# Patient Record
Sex: Male | Born: 1980
Health system: Southern US, Community
[De-identification: ages and names within clinical notes are randomized; demographics above are authoritative.]

## PROBLEM LIST (undated history)

## (undated) DIAGNOSIS — T8149XA Infection following a procedure, other surgical site, initial encounter: Secondary | ICD-10-CM

## (undated) DIAGNOSIS — J189 Pneumonia, unspecified organism: Secondary | ICD-10-CM

## (undated) DIAGNOSIS — L02213 Cutaneous abscess of chest wall: Secondary | ICD-10-CM

## (undated) DIAGNOSIS — Z8739 Personal history of other diseases of the musculoskeletal system and connective tissue: Secondary | ICD-10-CM

## (undated) DIAGNOSIS — K6812 Psoas muscle abscess: Secondary | ICD-10-CM

## (undated) DIAGNOSIS — B9561 Methicillin susceptible Staphylococcus aureus infection as the cause of diseases classified elsewhere: Secondary | ICD-10-CM

## (undated) DIAGNOSIS — A4901 Methicillin susceptible Staphylococcus aureus infection, unspecified site: Secondary | ICD-10-CM

## (undated) DIAGNOSIS — R7881 Bacteremia: Secondary | ICD-10-CM

## (undated) DIAGNOSIS — M009 Pyogenic arthritis, unspecified: Secondary | ICD-10-CM

## (undated) DIAGNOSIS — L0211 Cutaneous abscess of neck: Secondary | ICD-10-CM

## (undated) DIAGNOSIS — F1199 Opioid use, unspecified with unspecified opioid-induced disorder: Secondary | ICD-10-CM

## (undated) DIAGNOSIS — D649 Anemia, unspecified: Secondary | ICD-10-CM

## (undated) HISTORY — DX: Psoas muscle abscess: K68.12

## (undated) HISTORY — DX: Cutaneous abscess of neck: L02.11

## (undated) HISTORY — DX: Cutaneous abscess of chest wall: L02.213

## (undated) HISTORY — DX: Pyogenic arthritis, unspecified: M00.9

## (undated) HISTORY — DX: Opioid use, unspecified with unspecified opioid-induced disorder: F11.99

## (undated) HISTORY — PX: SHOULDER SURGERY: SHX246

## (undated) HISTORY — DX: Bacteremia: R78.81

## (undated) HISTORY — DX: Infection following a procedure, other surgical site, initial encounter: T81.49XA

## (undated) NOTE — *Deleted (*Deleted)
   CC: For ED follow-up, buprenorphine clinic  HPI:  Mr.Clifford Tucker is a 47 y.o. male with history as below presenting for 4 weekly day follow-up. Please refer to problem based charting for further details of assessment and plan of current problem and chronic medical conditions.  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  Recent social stressors include separation from family, legal issues, and new job.  Recently required increased dose of buprenorphine to the stressors.  At last visit, referral from-Suboxone was increased to 8-2 mg twice a day and 4-1 mg at midday.  PDMP reviewed and appropriate*** -Continue buprenorphine 2.5 films per day*** -Tox assure -Follow-up in 4 weeks  GAD As prescribed fluoxetine 20 mg daily since July,***  Preventive health -COVID vaccine: First dose Pfizer received in May -Influenza vaccine  Past Medical History:  Diagnosis Date  . Bacteremia due to methicillin susceptible Staphylococcus aureus (MSSA) 06/06/2017  . Chest wall abscess   . Hx of septic arthritis    Left hip  . 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 Injury of flexor tendon of left hand 05/08/2018  . Left hip postoperative wound infection   . MSSA (methicillin susceptible Staphylococcus aureus) 05/2017  . MSSA bacteremia   . Neck abscess 06/05/2017  . Normocytic anemia   . Opioid use disorder (HCC) 08/08/2017  . Opioid use disorder (HCC)   . Pneumonia   . Septic arthritis of hip (HCC) 06/06/2017  . Septic arthritis of sternoclavicular joint, right (HCC) 06/06/2017   Review of Systems:   ROS ***  Physical Exam: There were no vitals filed for this visit. Constitutional: no acute distress Head: atraumatic ENT: external ears normal Cardiovascular: regular rate and rhythm, normal heart sounds Pulmonary: effort normal, normal breath sounds bilaterally Abdominal: flat, nontender, no rebound tenderness, bowel sounds normal Musculoskeletal: ***  Skin: warm and dry Neurological: alert, no focal deficit Psychiatric: normal mood and affect  Assessment & Plan:   See Encounters Tab for problem based charting.  Patient {GC/GE:[id number]::"discussed with","seen with"} Dr. {NAMES:[id number]::"Butcher","Guilloud","Hoffman","Mullen","Narendra","Raines","Vincent"}

---
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
## 2001-08-18 ENCOUNTER — Emergency Department (HOSPITAL_COMMUNITY): Admission: EM | Admit: 2001-08-18 | Discharge: 2001-08-18 | Payer: Self-pay | Admitting: Emergency Medicine

## 2002-10-30 ENCOUNTER — Emergency Department (HOSPITAL_COMMUNITY): Admission: EM | Admit: 2002-10-30 | Discharge: 2002-10-30 | Payer: Self-pay | Admitting: *Deleted

## 2005-06-19 ENCOUNTER — Emergency Department (HOSPITAL_COMMUNITY): Admission: EM | Admit: 2005-06-19 | Discharge: 2005-06-19 | Payer: Self-pay | Admitting: *Deleted

## 2005-06-21 ENCOUNTER — Ambulatory Visit: Payer: Self-pay | Admitting: Internal Medicine

## 2005-06-21 ENCOUNTER — Inpatient Hospital Stay (HOSPITAL_COMMUNITY): Admission: EM | Admit: 2005-06-21 | Discharge: 2005-06-23 | Payer: Self-pay | Admitting: Emergency Medicine

## 2007-02-22 ENCOUNTER — Emergency Department (HOSPITAL_COMMUNITY): Admission: EM | Admit: 2007-02-22 | Discharge: 2007-02-22 | Payer: Self-pay | Admitting: Emergency Medicine

## 2007-02-24 ENCOUNTER — Emergency Department (HOSPITAL_COMMUNITY): Admission: EM | Admit: 2007-02-24 | Discharge: 2007-02-24 | Payer: Self-pay | Admitting: Emergency Medicine

## 2007-03-13 ENCOUNTER — Emergency Department (HOSPITAL_COMMUNITY): Admission: EM | Admit: 2007-03-13 | Discharge: 2007-03-13 | Payer: Self-pay | Admitting: Emergency Medicine

## 2009-10-12 ENCOUNTER — Emergency Department (HOSPITAL_COMMUNITY): Admission: EM | Admit: 2009-10-12 | Discharge: 2009-10-12 | Payer: Self-pay | Admitting: Emergency Medicine

## 2009-11-03 ENCOUNTER — Emergency Department (HOSPITAL_COMMUNITY): Admission: EM | Admit: 2009-11-03 | Discharge: 2009-11-03 | Payer: Self-pay | Admitting: Emergency Medicine

## 2010-09-13 ENCOUNTER — Emergency Department (HOSPITAL_COMMUNITY)
Admission: EM | Admit: 2010-09-13 | Discharge: 2010-09-13 | Disposition: A | Discharge status: Home / Self Care | Payer: Self-pay | Attending: Emergency Medicine | Admitting: Emergency Medicine

## 2010-09-13 DIAGNOSIS — K047 Periapical abscess without sinus: Secondary | ICD-10-CM | POA: Insufficient documentation

## 2010-09-13 DIAGNOSIS — K089 Disorder of teeth and supporting structures, unspecified: Secondary | ICD-10-CM | POA: Insufficient documentation

## 2010-10-08 NOTE — Discharge Summary (Signed)
Clifford Tucker, Clifford Tucker           ACCOUNT NO.:  [id number]   MEDICAL RECORD NO.:  [id number]          PATIENT TYPE:  INP   LOCATION:  4709                         FACILITY:  [facility]   PHYSICIAN:  Duncan Dull, M.D.     DATE OF BIRTH:  May 03, 1981   DATE OF ADMISSION:  06/21/2005  DATE OF DISCHARGE:  06/23/2005                                 DISCHARGE SUMMARY   DISCHARGE DIAGNOSES:  1.  Community-acquired pneumonia.  2.  Dehydration.   MEDICATIONS AT DISCHARGE:  1.  Avelox 400 mg p.o. daily times twelve more days.  2.  Robitussin DM over-the-counter for cough and congestion.   CONDITION ON DISCHARGE:  Improved symptoms.   The patient will follow up with Dr. Phillips Odor on February 8th at 2:30 p.m. at  [facility]. He is given samples of Avelox to complete a 14-  day course of antibiotics.   PROCEDURE:  1.  Chest x-ray on June 21, 2005, revealed right hilar mass in the      partial superior segment of the right lower lobe, consider possibility      of lymphoma with secondary resultant collapse. Possible left lung base      nodular airspace. Further vascular and chest CT is recommended.  2.  Chest CT on June 21, 2005, revealed infiltrate with dense      consolidation in the superior segment of right lower lobe. Infiltrate in      the region of the anterior basilar segment left lower lobe. No definite      hilar mass is seen. Recommend follow up until complete clearing of these      densities.   BRIEF HISTORY AND PHYSICAL:  Clifford Tucker is a 30 year old previously healthy  white male with a five-day history of cough, fever, and chills; nausea,  vomiting, and diarrhea began about two days prior to admission. He was seen  at the emergency department  at that time and suspected to have  gastroenteritis and was sent home one day prior to admission. He developed  right pleuritic chest pain with blood tinged sputum. He denies any sick  contacts. He denies nightsweats.  Denies weight loss.   PAST MEDICAL HISTORY:  None.   MEDICATIONS:  None.   PHYSICAL EXAMINATION:  On admission temperature was 100.6, blood pressure  122/78, pulse 106, respiratory rate 29, oxygen saturation 94% on room air.  In general, he was in no acute distress, alert and oriented. ENT revealed  dry mucous membranes. Oropharynx was clear. Pupils equal, round, and  reactive to light. Neck was supple. Cardiovascular was tachycardic, but  regular. No murmurs. Lung exam notable for decreased breath sounds right  lower lobe and right mid lung fields. Expiratory rhonchi in the left long.  No egophony. Abdomen was soft,  nontender, nondistended. Positive bowel  sounds. Extremities revealed no edema. Neurologic exam was nonfocal.   LABORATORY DATA:  White count 21.8, hemoglobin 13.2, platelet count 189,000,  ANC 20.9. Sodium 129, potassium 3.1, chloride 94, bicarbonate 25, BUN 6,  creatinine 0.9, and glucose of 107.   HOSPITAL COURSE BY PROBLEM:  Problem #1:  Community acquired pneumonia with  right lobe consolidation associated with blood-tinged sputum, pleuritic  chest pain, and rigors. Patient was admitted and treated empirically  treatemend with cephalosprin and macrolide. By the second day of admission  the patient felt much better. He has taken the pneumococcal vaccine and  white count increased from 12.8 to 13.3 and then down to 8.1. On the day of  discharge he was changed to oral antibiotics with Avelox and discharged home  with a 12-day course of Avelox to complete a 14-day course of antibiotics.  He will follow up in the clinic with Dr. Phillips Odor on June 30, 2005,  to  ensure resolution of symptoms. Will need a repeat chest x-ray or CT in four  to six weeks for follow up resolution of the densities seen on these imaging  studies. An HIV viral load was also obtained and these results are pending  at the time of discharge.   DISCHARGE LABORATORIES AND VITALS:  Temperature 98.2,  blood pressure 120/73,  pulse 69, respiratory rate 16, oxygen saturation 98% on room air. White  count 8.1, hemoglobin 13.1, platelet count 274,000. Sodium 127, potassium  3.6, chloride 105, bicarbonate 24, BUN 3, creatinine 0.7, glucose 87.      Clent Demark, M.D.      Duncan Dull, M.D.  Electronically Signed    LG/MEDQ  D:  06/28/2005  T:  06/28/2005  Job:  [id number]

## 2011-03-02 LAB — I-STAT 8, (EC8 V) (CONVERTED LAB)
BUN: 7
Bicarbonate: 20.2
Glucose, Bld: 93
HCT: 51
pCO2, Ven: 35.3 — ABNORMAL LOW

## 2011-03-02 LAB — POCT CARDIAC MARKERS
CKMB, poc: <1 — ABNORMAL LOW
Myoglobin, poc: 17.3
Troponin i, poc: <0.05

## 2011-03-02 LAB — ETHANOL: Alcohol, Ethyl (B): 125 — ABNORMAL HIGH

## 2011-03-02 LAB — POCT I-STAT CREATININE: Operator id: 272551

## 2011-12-01 ENCOUNTER — Encounter (HOSPITAL_COMMUNITY): Payer: Self-pay | Admitting: Emergency Medicine

## 2011-12-01 ENCOUNTER — Emergency Department (HOSPITAL_COMMUNITY)
Admission: EM | Admit: 2011-12-01 | Discharge: 2011-12-01 | Disposition: A | Discharge status: Home / Self Care | Payer: Self-pay | Attending: Emergency Medicine | Admitting: Emergency Medicine

## 2011-12-01 ENCOUNTER — Emergency Department (HOSPITAL_COMMUNITY): Payer: Self-pay

## 2011-12-01 DIAGNOSIS — J189 Pneumonia, unspecified organism: Secondary | ICD-10-CM | POA: Insufficient documentation

## 2011-12-01 DIAGNOSIS — J939 Pneumothorax, unspecified: Secondary | ICD-10-CM | POA: Insufficient documentation

## 2011-12-01 DIAGNOSIS — R079 Chest pain, unspecified: Secondary | ICD-10-CM | POA: Insufficient documentation

## 2011-12-01 DIAGNOSIS — F17200 Nicotine dependence, unspecified, uncomplicated: Secondary | ICD-10-CM | POA: Insufficient documentation

## 2011-12-01 HISTORY — DX: Pneumonia, unspecified organism: J18.9

## 2011-12-01 LAB — POCT I-STAT TROPONIN I: Troponin i, poc: 0 ng/mL (ref 0.00–0.08)

## 2011-12-01 MED ORDER — NAPROXEN 500 MG PO TABS: 500.0000 mg | ORAL_TABLET | Freq: Two times a day (BID) | ORAL | Status: DC

## 2011-12-01 MED ORDER — KETOROLAC TROMETHAMINE 60 MG/2ML IM SOLN
60.0000 mg | Freq: Once | INTRAMUSCULAR | Status: AC
  Administered 2011-12-01: 60 mg via INTRAMUSCULAR
  Filled 2011-12-01: qty 2

## 2011-12-01 MED ORDER — LORAZEPAM 1 MG PO TABS
1.0000 mg | ORAL_TABLET | Freq: Once | ORAL | Status: AC
  Administered 2011-12-01: 1 mg via ORAL
  Filled 2011-12-01: qty 1

## 2011-12-01 NOTE — ED Notes (Signed)
Patient pacing the room complaining of severe chest discomfort. Patient has removed ECG leads and BP cuff. RN attempted to explain to patient rationale of ECG/BP monitoring. Patient being belligerent and difficult with staff. RN administered pain medication as ordered. Spoke with MD who will see the patient. Monitoring resumed. VSS. NAD. AAOx4. Will continue to monitor

## 2011-12-01 NOTE — ED Notes (Signed)
C/o left side CP worse with mvmt & inspiration since yesterday. States having to breathe shallow due to pain. Denies n/v, diaphoresis, cold, cough, fever, chills. Reports feels the same way when Dx with PNA & "a partially collapsed lung" in past but pt did not ever have a chest tube. POX> 99% RA. Lungs clear BBS.

## 2011-12-01 NOTE — ED Provider Notes (Signed)
History     CSN: [id number]  Arrival date & time 12/01/11  1807   First MD Initiated Contact with Patient 12/01/11 1841      Chief Complaint  Patient presents with  . Chest Pain    (Consider location/radiation/quality/duration/timing/severity/associated sxs/prior treatment) Patient is a 31 y.o. male presenting with chest pain. The history is provided by the patient.  Chest Pain The chest pain began yesterday. Duration of episode(s) is 24 hours. Chest pain occurs constantly. The chest pain is worsening. The pain is associated with breathing and coughing. The severity of the pain is moderate. The quality of the pain is described as sharp. The pain does not radiate. Chest pain is worsened by certain positions. Pertinent negatives for primary symptoms include no fever, no fatigue, no shortness of breath, no cough, no wheezing, no palpitations, no abdominal pain, no nausea and no vomiting.  Pertinent negatives for associated symptoms include no numbness and no weakness. He tried nothing for the symptoms. Risk factors include male gender.  Pertinent negatives for family medical history include: no CAD in family.  Procedure history is negative for cardiac catheterization.     Past Medical History  Diagnosis Date  . Pneumonia     Past Surgical History  Procedure Date  . Shoulder surgery     History reviewed. No pertinent family history.  History  Substance Use Topics  . Smoking status: Current Everyday Smoker  . Smokeless tobacco: Not on file  . Alcohol Use: No      Review of Systems  Constitutional: Negative for fever, activity change, appetite change and fatigue.  HENT: Negative for congestion, sore throat, facial swelling, rhinorrhea, trouble swallowing, neck pain, neck stiffness, voice change and sinus pressure.   Eyes: Negative.   Respiratory: Negative for cough, choking, chest tightness, shortness of breath and wheezing.   Cardiovascular: Positive for chest pain.  Negative for palpitations and leg swelling.  Gastrointestinal: Negative for nausea, vomiting and abdominal pain.  Genitourinary: Negative for dysuria, urgency, frequency, hematuria, flank pain and difficulty urinating.  Musculoskeletal: Negative for back pain and gait problem.  Skin: Negative for rash and wound.  Neurological: Negative for facial asymmetry, weakness, numbness and headaches.  Psychiatric/Behavioral: Negative for behavioral problems, confusion and agitation. The patient is not nervous/anxious and is not hyperactive.   All other systems reviewed and are negative.    Allergies  Review of patient's allergies indicates no known allergies.  Home Medications   Current Outpatient Rx  Name Route Sig Dispense Refill  . BC HEADACHE POWDER PO Oral Take 1 packet by mouth 2 (two) times daily as needed. For chest pain      BP 159/80  Pulse 66  Temp 99 F (37.2 C) (Oral)  Resp 18  SpO2 100%  Physical Exam  Nursing note and vitals reviewed. Constitutional: He is oriented to person, place, and time. He appears well-developed and well-nourished. No distress.  HENT:  Head: Normocephalic and atraumatic.  Right Ear: External ear normal.  Left Ear: External ear normal.  Mouth/Throat: No oropharyngeal exudate.  Eyes: Conjunctivae and EOM are normal. Pupils are equal, round, and reactive to light. Right eye exhibits no discharge. Left eye exhibits no discharge.  Neck: Normal range of motion. Neck supple. No JVD present. No tracheal deviation present. No thyromegaly present.  Cardiovascular: Normal rate, regular rhythm, normal heart sounds and intact distal pulses.  Exam reveals no gallop and no friction rub.   No murmur heard. Pulmonary/Chest: Effort normal and breath sounds normal.  No respiratory distress. He has no wheezes. He exhibits no tenderness.  Abdominal: Soft. Bowel sounds are normal. He exhibits no distension. There is no tenderness. There is no rebound and no guarding.    Musculoskeletal: Normal range of motion. He exhibits no edema and no tenderness.  Lymphadenopathy:    He has no cervical adenopathy.  Neurological: He is alert and oriented to person, place, and time. No cranial nerve deficit.  Skin: Skin is warm and dry. No rash noted. He is not diaphoretic. No pallor.  Psychiatric: He has a normal mood and affect. His behavior is normal.    ED Course  Procedures (including critical care time)   Labs Reviewed  D-DIMER, QUANTITATIVE   Dg Chest 2 View  12/01/2011  *RADIOLOGY REPORT*  Clinical Data: 31 year old male with left side chest pain.  CHEST - 2 VIEW  Comparison: 03/13/2007.  Findings: Slightly lower lung volumes.  Cardiac size and mediastinal contours are within normal limits.  Visualized tracheal air column is within normal limits.  No pneumothorax or effusion. No pulmonary edema.  No acute pulmonary opacity.  Incidental EKG button artifact projecting over both upper lobes and the left lower lobe. No acute osseous abnormality identified.  IMPRESSION: No acute cardiopulmonary abnormality.  Original Report Authenticated By: Harley Hallmark, M.D.     No diagnosis found.    MDM  31 year old male patient with past medical history of pneumonia and reported history of a pneumothorax on the left that did not require chest tube about 10 years ago. Patient says he's been having sharp left-sided chest pain at rest that started spontaneously not worse with exertion but is worse with certain positions deep breathing and seems very pleuritic in nature the last 24 hours and is increasing in intensity. Patient with Wells score of 0 point but not Perc negatives given the pleuritic nature the pain. Will get d-dimer to assess possibility of pulmonary embolism. doubt ACS given TIMI score of 0.  Results for orders placed during the hospital encounter of 12/01/11  D-DIMER, QUANTITATIVE      Component Value Range   D-Dimer, Quant 0.37  0.00 - 0.48 ug/mL-FEU  POCT  I-STAT TROPONIN I      Component Value Range   Troponin i, poc 0.00  0.00 - 0.08 ng/mL   Comment 3             DG Chest 2 View (Final result)   Result time:12/01/11 1849    Final result by Rad Results In Interface (12/01/11 18:49:31)    Narrative:   *RADIOLOGY REPORT*  Clinical Data: 31 year old male with left side chest pain.  CHEST - 2 VIEW  Comparison: 03/13/2007.  Findings: Slightly lower lung volumes. Cardiac size and mediastinal contours are within normal limits. Visualized tracheal air column is within normal limits. No pneumothorax or effusion. No pulmonary edema. No acute pulmonary opacity. Incidental EKG button artifact projecting over both upper lobes and the left lower lobe. No acute osseous abnormality identified.  IMPRESSION: No acute cardiopulmonary abnormality.  Original Report Authenticated By: Harley Hallmark, M.D.     Chest x-ray shows no pneumonia or pneumothorax my exam is normal. Clear lung sounds throughout. Give patient Toradol. Pain resolved with Toradol. Patient labs reassuring and normal. Could be musculoskeletal or pleurisy pain. Patient discharged to followup with PCP.  Case discussed with Dr.Rancour  Sherryl Manges, MD 12/02/11 [phone]

## 2011-12-01 NOTE — ED Notes (Signed)
Pt c/o midsternal CP starting last night that was worse today when taking a deep breath; pt sts feels similar to when have pneumothorax in past; lung sounds diminished more on right

## 2011-12-02 NOTE — ED Provider Notes (Signed)
I saw and evaluated the patient, reviewed the resident's note and I agree with the findings and plan.  Diffuse chest pain that radiates across chest, worse with deep breathing.  Hx PTX. Lungs clear, CXR neg. D-dimer neg.    Glynn Octave, MD 12/02/11 [phone]

## 2012-10-03 ENCOUNTER — Emergency Department (HOSPITAL_COMMUNITY): Payer: Self-pay

## 2012-10-03 ENCOUNTER — Encounter (HOSPITAL_COMMUNITY): Payer: Self-pay | Admitting: Emergency Medicine

## 2012-10-03 ENCOUNTER — Emergency Department (HOSPITAL_COMMUNITY)
Admission: EM | Admit: 2012-10-03 | Discharge: 2012-10-03 | Disposition: A | Discharge status: Home / Self Care | Payer: Self-pay | Attending: Emergency Medicine | Admitting: Emergency Medicine

## 2012-10-03 DIAGNOSIS — F17200 Nicotine dependence, unspecified, uncomplicated: Secondary | ICD-10-CM | POA: Insufficient documentation

## 2012-10-03 DIAGNOSIS — Z8701 Personal history of pneumonia (recurrent): Secondary | ICD-10-CM | POA: Insufficient documentation

## 2012-10-03 DIAGNOSIS — N509 Disorder of male genital organs, unspecified: Secondary | ICD-10-CM | POA: Insufficient documentation

## 2012-10-03 LAB — URINALYSIS, ROUTINE W REFLEX MICROSCOPIC
Ketones, ur: NEGATIVE mg/dL
Leukocytes, UA: NEGATIVE
Nitrite: NEGATIVE
Protein, ur: NEGATIVE mg/dL
pH: 6 (ref 5.0–8.0)

## 2012-10-03 MED ORDER — CEFTRIAXONE SODIUM 250 MG IJ SOLR
250.0000 mg | Freq: Once | INTRAMUSCULAR | Status: AC
  Administered 2012-10-03: 250 mg via INTRAMUSCULAR
  Filled 2012-10-03: qty 250

## 2012-10-03 MED ORDER — DOXYCYCLINE HYCLATE 100 MG PO TABS
100.0000 mg | ORAL_TABLET | Freq: Once | ORAL | Status: AC
  Administered 2012-10-03: 100 mg via ORAL
  Filled 2012-10-03: qty 1

## 2012-10-03 MED ORDER — DOXYCYCLINE HYCLATE 100 MG PO TABS: 100.0000 mg | ORAL_TABLET | Freq: Two times a day (BID) | ORAL | Status: DC

## 2012-10-03 MED ORDER — OXYCODONE-ACETAMINOPHEN 5-325 MG PO TABS
2.0000 | ORAL_TABLET | Freq: Once | ORAL | Status: AC
  Administered 2012-10-03: 2 via ORAL
  Filled 2012-10-03: qty 2

## 2012-10-03 MED ORDER — HYDROCODONE-ACETAMINOPHEN 5-325 MG PO TABS: ORAL_TABLET | ORAL | Status: DC

## 2012-10-03 MED ORDER — NAPROXEN 250 MG PO TABS: 250.0000 mg | ORAL_TABLET | Freq: Two times a day (BID) | ORAL | Status: DC

## 2012-10-03 NOTE — ED Provider Notes (Signed)
History     CSN: [id number]  Arrival date & time 10/03/12  0729   First MD Initiated Contact with Patient 10/03/12 0750      Chief Complaint  Patient presents with  . Testicle Pain     HPI Pt was seen at 0805.   Per pt, c/o gradual onset and persistence of constant bilat testicular "pain" for the past 1 month.  Describes the pain as "throbbing," located in his bilat testicles.  Has been associated with testicular "swelling."  Denies back/flank pain, no penile drainage, no dysuria/hematuria, no N/V/D, no abd pain, no rash, no fevers.     Past Medical History  Diagnosis Date  . Pneumonia     Past Surgical History  Procedure Laterality Date  . Shoulder surgery        History  Substance Use Topics  . Smoking status: Current Every Day Smoker -- 1.00 packs/day  . Smokeless tobacco: Not on file  . Alcohol Use: No      Review of Systems ROS: Statement: All systems negative except as marked or noted in the HPI; Constitutional: Negative for fever and chills. ; ; Eyes: Negative for eye pain, redness and discharge. ; ; ENMT: Negative for ear pain, hoarseness, nasal congestion, sinus pressure and sore throat. ; ; Cardiovascular: Negative for chest pain, palpitations, diaphoresis, dyspnea and peripheral edema. ; ; Respiratory: Negative for cough, wheezing and stridor. ; ; Gastrointestinal: Negative for nausea, vomiting, diarrhea, abdominal pain, blood in stool, hematemesis, jaundice and rectal bleeding. ; ; Genitourinary: Negative for dysuria, flank pain and hematuria. ; ; Genital:  No penile drainage or rash, +bilat testicular pain and swelling, no scrotal rash or swelling.;; Musculoskeletal: Negative for back pain and neck pain. Negative for swelling and trauma.; ; Skin: Negative for pruritus, rash, abrasions, blisters, bruising and skin lesion.; ; Neuro: Negative for headache, lightheadedness and neck stiffness. Negative for weakness, altered level of consciousness , altered mental  status, extremity weakness, paresthesias, involuntary movement, seizure and syncope.       Allergies  Review of patient's allergies indicates no known allergies.  Home Medications   Current Outpatient Rx  Name  Route  Sig  Dispense  Refill  . Aspirin-Salicylamide-Caffeine (BC HEADACHE POWDER PO)   Oral   Take 1 packet by mouth 2 (two) times daily as needed. For chest pain         . naproxen (NAPROSYN) 500 MG tablet   Oral   Take 1 tablet (500 mg total) by mouth 2 (two) times daily.   30 tablet   0     BP 136/72  Pulse 87  Temp(Src) 98.8 F (37.1 C) (Oral)  Resp 22  Ht 5\' 10"  (1.778 m)  Wt 160 lb (72.576 kg)  BMI 22.96 kg/m2  SpO2 99%  Physical Exam 0810: Physical examination:  Nursing notes reviewed; Vital signs and O2 SAT reviewed;  Constitutional: Well developed, Well nourished, Well hydrated, In no acute distress; Head:  Normocephalic, atraumatic; Eyes: EOMI, PERRL, No scleral icterus; ENMT: Mouth and pharynx normal, Mucous membranes moist; Neck: Supple, Full range of motion, No lymphadenopathy; Cardiovascular: Regular rate and rhythm, No murmur, rub, or gallop; Respiratory: Breath sounds clear & equal bilaterally, No rales, rhonchi, wheezes.  Speaking full sentences with ease, Normal respiratory effort/excursion; Chest: Nontender, Movement normal; Abdomen: Soft, Nontender, Nondistended, Normal bowel sounds; Genitourinary: No CVA tenderness. Genital exam performed with pt permission and male ED staff chaperone present during exam. No perineal erythema.  No penile blisters, no  drainage.  No scrotal erythema or edema to palp.  Normal testicular lie. +bilat generalized testicular tenderness to palp.  +cremasteric reflexes bilat.  No inguinal LAN or palpable masses.;; Extremities: Pulses normal, No tenderness, No edema, No calf edema or asymmetry.; Neuro: AA&Ox3, Major CN grossly intact.  Speech clear. No gross focal motor or sensory deficits in extremities.; Skin: Color normal,  Warm, Dry, +several scattered small excoriated maculopapular lesions on lower abd wall, upper genital area, 2 on upper penile shaft. No drainage, no blisters.   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  States he "got these in the past several days to week" vs his testicular pain that has been constant for the past 1 month.  Continues to deny penile drainage, dysuria or hematuria.  Will check GC/chlam, and testicular US. Pt agreeable with plan.    MDM  MDM Reviewed: nursing note, vitals and previous chart Interpretation: ultrasound and labs   Results for orders placed during the hospital encounter of 10/03/12  URINALYSIS, ROUTINE W REFLEX MICROSCOPIC      Result Value Range   Color, Urine YELLOW  YELLOW   APPearance CLEAR  CLEAR   Specific Gravity, Urine 1.015  1.005 - 1.030   pH 6.0  5.0 - 8.0   Glucose, UA NEGATIVE  NEGATIVE mg/dL   Hgb urine dipstick NEGATIVE  NEGATIVE   Bilirubin Urine NEGATIVE  NEGATIVE   Ketones, ur NEGATIVE  NEGATIVE mg/dL   Protein, ur NEGATIVE  NEGATIVE mg/dL   Urobilinogen, UA 0.2  0.0 - 1.0 mg/dL   Nitrite NEGATIVE  NEGATIVE   Leukocytes, UA NEGATIVE  NEGATIVE   [place] Art/ven Flow Abd Pelv Doppler 10/03/2012   *RADIOLOGY REPORT*  Clinical Data:  Bilateral testicular pain for 1 month.  Rule out torsion.  SCROTAL ULTRASOUND DOPPLER ULTRASOUND OF THE TESTICLES  Technique: Complete ultrasound examination of the testicles, epididymis, and other scrotal structures was performed.  Color and spectral Doppler ultrasound were also utilized to evaluate blood flow to the testicles.  Comparison:  None  Findings:  Right testis:  5.2 x 2.8 x 3.1 cm.  Normal in gray scale and color Doppler appearance.  Left testis:  4.9 x 2.4 x 2.7 cm.  Normal in gray scale and color Doppler appearance.  Right epididymis:  Normal in size and  appearance.  Left epididymis:  Normal in size and appearance.  Hydrocele:  Bilateral.  Small to moderate on the left and small on the right.  Varicocele:  Absent  Pulsed Doppler interrogation of both testes demonstrates low resistance flow bilaterally. Incidental note is made of an echogenic focus within the left hemi scrotum measuring 6 mm.  Likely a "scrotal pearl."  Image 70.  IMPRESSION:  1.  No evidence of testicular torsion or other explanation for scrotal pain. 2.  Bilateral left greater than right hydroceles. 3.  Probable left-sided "scrotal pearl."  Typically these are secondary to  remote torsion of a testicular appendage or remote trauma. Likely of no clinical significance.   Original Report Authenticated By: Jeronimo Greaves, M.D.    1025:  US testicles without acute findings.  GC/chlam pending; will tx empirically for both at this time and refer to Uro MD.  Dx and testing d/w pt. Questions answered.  Verb understanding, agreeable to d/c home with outpt f/u.  Laray Anger, DO 10/06/12 1047

## 2012-10-03 NOTE — ED Notes (Signed)
Pt waited 15 minute shot time before being d/c. No s/s of allergic reaction noted. nad noted at d/c.

## 2012-10-03 NOTE — ED Notes (Addendum)
Pt reports testicle pain and swelling x1 month. Pt reports pain and swelling is getting worse. Pt denies any urinary or GI symptoms.nad noted. Pt denies injury.

## 2012-10-04 LAB — URINE CULTURE: Culture: NO GROWTH

## 2017-05-23 DIAGNOSIS — A4901 Methicillin susceptible Staphylococcus aureus infection, unspecified site: Secondary | ICD-10-CM

## 2017-05-23 HISTORY — DX: Methicillin susceptible Staphylococcus aureus infection, unspecified site: A49.01

## 2017-06-05 ENCOUNTER — Inpatient Hospital Stay (HOSPITAL_COMMUNITY)
Admission: EM | Admit: 2017-06-05 | Discharge: 2017-08-02 | DRG: 853 | Disposition: A | Discharge status: Home / Self Care | Payer: Self-pay | Attending: Oncology | Admitting: Oncology

## 2017-06-05 ENCOUNTER — Emergency Department (HOSPITAL_COMMUNITY): Payer: Self-pay

## 2017-06-05 ENCOUNTER — Encounter (HOSPITAL_COMMUNITY): Payer: Self-pay | Admitting: Emergency Medicine

## 2017-06-05 ENCOUNTER — Other Ambulatory Visit: Payer: Self-pay

## 2017-06-05 DIAGNOSIS — E876 Hypokalemia: Secondary | ICD-10-CM | POA: Diagnosis not present

## 2017-06-05 DIAGNOSIS — F17200 Nicotine dependence, unspecified, uncomplicated: Secondary | ICD-10-CM | POA: Diagnosis present

## 2017-06-05 DIAGNOSIS — M25552 Pain in left hip: Secondary | ICD-10-CM | POA: Diagnosis present

## 2017-06-05 DIAGNOSIS — J188 Other pneumonia, unspecified organism: Secondary | ICD-10-CM | POA: Diagnosis present

## 2017-06-05 DIAGNOSIS — D72819 Decreased white blood cell count, unspecified: Secondary | ICD-10-CM | POA: Diagnosis not present

## 2017-06-05 DIAGNOSIS — F419 Anxiety disorder, unspecified: Secondary | ICD-10-CM | POA: Diagnosis not present

## 2017-06-05 DIAGNOSIS — M60004 Infective myositis, unspecified left leg: Secondary | ICD-10-CM | POA: Diagnosis present

## 2017-06-05 DIAGNOSIS — L0211 Cutaneous abscess of neck: Secondary | ICD-10-CM | POA: Diagnosis present

## 2017-06-05 DIAGNOSIS — R7881 Bacteremia: Secondary | ICD-10-CM | POA: Diagnosis present

## 2017-06-05 DIAGNOSIS — I76 Septic arterial embolism: Secondary | ICD-10-CM | POA: Diagnosis present

## 2017-06-05 DIAGNOSIS — F411 Generalized anxiety disorder: Secondary | ICD-10-CM

## 2017-06-05 DIAGNOSIS — F1123 Opioid dependence with withdrawal: Secondary | ICD-10-CM | POA: Diagnosis not present

## 2017-06-05 DIAGNOSIS — R509 Fever, unspecified: Secondary | ICD-10-CM

## 2017-06-05 DIAGNOSIS — M009 Pyogenic arthritis, unspecified: Secondary | ICD-10-CM | POA: Diagnosis present

## 2017-06-05 DIAGNOSIS — R0602 Shortness of breath: Secondary | ICD-10-CM

## 2017-06-05 DIAGNOSIS — L02213 Cutaneous abscess of chest wall: Secondary | ICD-10-CM

## 2017-06-05 DIAGNOSIS — K6812 Psoas muscle abscess: Secondary | ICD-10-CM | POA: Diagnosis present

## 2017-06-05 DIAGNOSIS — B999 Unspecified infectious disease: Secondary | ICD-10-CM

## 2017-06-05 DIAGNOSIS — D649 Anemia, unspecified: Secondary | ICD-10-CM | POA: Diagnosis present

## 2017-06-05 DIAGNOSIS — I2690 Septic pulmonary embolism without acute cor pulmonale: Secondary | ICD-10-CM | POA: Diagnosis present

## 2017-06-05 DIAGNOSIS — B9561 Methicillin susceptible Staphylococcus aureus infection as the cause of diseases classified elsewhere: Secondary | ICD-10-CM | POA: Diagnosis present

## 2017-06-05 DIAGNOSIS — J189 Pneumonia, unspecified organism: Secondary | ICD-10-CM | POA: Diagnosis present

## 2017-06-05 DIAGNOSIS — M00011 Staphylococcal arthritis, right shoulder: Secondary | ICD-10-CM | POA: Diagnosis present

## 2017-06-05 DIAGNOSIS — A4101 Sepsis due to Methicillin susceptible Staphylococcus aureus: Principal | ICD-10-CM | POA: Diagnosis present

## 2017-06-05 DIAGNOSIS — M71152 Other infective bursitis, left hip: Secondary | ICD-10-CM | POA: Diagnosis present

## 2017-06-05 DIAGNOSIS — Z23 Encounter for immunization: Secondary | ICD-10-CM

## 2017-06-05 DIAGNOSIS — T8149XA Infection following a procedure, other surgical site, initial encounter: Secondary | ICD-10-CM

## 2017-06-05 DIAGNOSIS — T361X5A Adverse effect of cephalosporins and other beta-lactam antibiotics, initial encounter: Secondary | ICD-10-CM | POA: Diagnosis not present

## 2017-06-05 DIAGNOSIS — J181 Lobar pneumonia, unspecified organism: Secondary | ICD-10-CM | POA: Diagnosis present

## 2017-06-05 DIAGNOSIS — M00052 Staphylococcal arthritis, left hip: Secondary | ICD-10-CM | POA: Diagnosis present

## 2017-06-05 DIAGNOSIS — M869 Osteomyelitis, unspecified: Secondary | ICD-10-CM | POA: Diagnosis present

## 2017-06-05 DIAGNOSIS — R21 Rash and other nonspecific skin eruption: Secondary | ICD-10-CM | POA: Diagnosis not present

## 2017-06-05 HISTORY — DX: Methicillin susceptible Staphylococcus aureus infection, unspecified site: A49.01

## 2017-06-05 HISTORY — DX: Personal history of other diseases of the musculoskeletal system and connective tissue: Z87.39

## 2017-06-05 HISTORY — DX: Anemia, unspecified: D64.9

## 2017-06-05 HISTORY — DX: Cutaneous abscess of neck: L02.11

## 2017-06-05 HISTORY — DX: Methicillin susceptible Staphylococcus aureus infection as the cause of diseases classified elsewhere: B95.61

## 2017-06-05 HISTORY — DX: Bacteremia: R78.81

## 2017-06-05 LAB — COMPREHENSIVE METABOLIC PANEL
ALBUMIN: 2.9 g/dL — AB (ref 3.5–5.0)
ALK PHOS: 201 U/L — AB (ref 38–126)
ALT: 63 U/L (ref 17–63)
ANION GAP: 14 (ref 5–15)
AST: 67 U/L — ABNORMAL HIGH (ref 15–41)
BUN: 9 mg/dL (ref 6–20)
CALCIUM: 9 mg/dL (ref 8.9–10.3)
CO2: 27 mmol/L (ref 22–32)
Chloride: 96 mmol/L — ABNORMAL LOW (ref 101–111)
Creatinine, Ser: 0.66 mg/dL (ref 0.61–1.24)
GFR calc Af Amer: >60 mL/min (ref >60)
GFR calc non Af Amer: >60 mL/min (ref >60)
GLUCOSE: 109 mg/dL — AB (ref 65–99)
Potassium: 3.7 mmol/L (ref 3.5–5.1)
SODIUM: 137 mmol/L (ref 135–145)
Total Bilirubin: 0.5 mg/dL (ref 0.3–1.2)
Total Protein: 7.5 g/dL (ref 6.5–8.1)

## 2017-06-05 LAB — I-STAT CG4 LACTIC ACID, ED: Lactic Acid, Venous: 0.82 mmol/L (ref 0.5–1.9)

## 2017-06-05 LAB — CBC WITH DIFFERENTIAL/PLATELET
BASOS ABS: 0 10*3/uL (ref 0.0–0.1)
BASOS PCT: 0 %
Eosinophils Absolute: 0.5 10*3/uL (ref 0.0–0.7)
Eosinophils Relative: 2 %
HEMATOCRIT: 39.4 % (ref 39.0–52.0)
HEMOGLOBIN: 13.3 g/dL (ref 13.0–17.0)
LYMPHS PCT: 6 %
Lymphs Abs: 1.4 10*3/uL (ref 0.7–4.0)
MCH: 30.6 pg (ref 26.0–34.0)
MCHC: 33.8 g/dL (ref 30.0–36.0)
MCV: 90.6 fL (ref 78.0–100.0)
MONOS PCT: 10 %
Monocytes Absolute: 2.3 10*3/uL — ABNORMAL HIGH (ref 0.1–1.0)
NEUTROS ABS: 18.8 10*3/uL — AB (ref 1.7–7.7)
NEUTROS PCT: 82 %
Platelets: 318 10*3/uL (ref 150–400)
RBC: 4.35 MIL/uL (ref 4.22–5.81)
RDW: 14.3 % (ref 11.5–15.5)
WBC: 23 10*3/uL — ABNORMAL HIGH (ref 4.0–10.5)

## 2017-06-05 LAB — I-STAT CHEM 8, ED
BUN: 6 mg/dL (ref 6–20)
CALCIUM ION: 1.05 mmol/L — AB (ref 1.15–1.40)
CHLORIDE: 95 mmol/L — AB (ref 101–111)
Creatinine, Ser: 0.7 mg/dL (ref 0.61–1.24)
Glucose, Bld: 106 mg/dL — ABNORMAL HIGH (ref 65–99)
HCT: 43 % (ref 39.0–52.0)
Hemoglobin: 14.6 g/dL (ref 13.0–17.0)
Potassium: 3.6 mmol/L (ref 3.5–5.1)
SODIUM: 135 mmol/L (ref 135–145)
TCO2: 29 mmol/L (ref 22–32)

## 2017-06-05 MED ORDER — VANCOMYCIN HCL 10 G IV SOLR
1500.0000 mg | Freq: Once | INTRAVENOUS | Status: AC
  Administered 2017-06-05: 1500 mg via INTRAVENOUS
  Filled 2017-06-05: qty 1500

## 2017-06-05 MED ORDER — PIPERACILLIN-TAZOBACTAM 3.375 G IVPB 30 MIN
3.3750 g | Freq: Once | INTRAVENOUS | Status: AC
  Administered 2017-06-05: 3.375 g via INTRAVENOUS
  Filled 2017-06-05: qty 50

## 2017-06-05 MED ORDER — PIPERACILLIN-TAZOBACTAM 3.375 G IVPB
3.3750 g | Freq: Three times a day (TID) | INTRAVENOUS | Status: DC
  Administered 2017-06-06: 3.375 g via INTRAVENOUS
  Filled 2017-06-05 (×2): qty 50

## 2017-06-05 MED ORDER — HYDROMORPHONE HCL 1 MG/ML IJ SOLN
1.0000 mg | INTRAMUSCULAR | Status: DC | PRN
  Administered 2017-06-06 (×2): 1 mg via INTRAVENOUS
  Filled 2017-06-05 (×2): qty 1

## 2017-06-05 MED ORDER — VANCOMYCIN HCL IN DEXTROSE 1-5 GM/200ML-% IV SOLN
1000.0000 mg | Freq: Three times a day (TID) | INTRAVENOUS | Status: DC
  Administered 2017-06-06: 1000 mg via INTRAVENOUS
  Filled 2017-06-05 (×2): qty 200

## 2017-06-05 MED ORDER — ENOXAPARIN SODIUM 40 MG/0.4ML ~~LOC~~ SOLN
40.0000 mg | SUBCUTANEOUS | Status: DC
  Administered 2017-06-08: 40 mg via SUBCUTANEOUS
  Filled 2017-06-05 (×3): qty 0.4

## 2017-06-05 MED ORDER — SENNOSIDES-DOCUSATE SODIUM 8.6-50 MG PO TABS
1.0000 | ORAL_TABLET | Freq: Every evening | ORAL | Status: DC | PRN
Start: 2017-06-05 — End: 2017-08-02

## 2017-06-05 MED ORDER — HYDROCODONE-ACETAMINOPHEN 5-325 MG PO TABS
2.0000 | ORAL_TABLET | Freq: Once | ORAL | Status: AC
  Administered 2017-06-05: 2 via ORAL
  Filled 2017-06-05: qty 2

## 2017-06-05 MED ORDER — KETOROLAC TROMETHAMINE 30 MG/ML IJ SOLN
30.0000 mg | Freq: Four times a day (QID) | INTRAMUSCULAR | Status: AC | PRN
  Administered 2017-06-05 – 2017-06-10 (×6): 30 mg via INTRAVENOUS
  Filled 2017-06-05 (×5): qty 1

## 2017-06-05 MED ORDER — ONDANSETRON HCL 4 MG/2ML IJ SOLN
4.0000 mg | Freq: Four times a day (QID) | INTRAMUSCULAR | Status: DC | PRN
  Administered 2017-06-06: 4 mg via INTRAVENOUS
  Filled 2017-06-05: qty 2

## 2017-06-05 MED ORDER — ONDANSETRON HCL 4 MG/2ML IJ SOLN
4.0000 mg | Freq: Once | INTRAMUSCULAR | Status: AC
  Administered 2017-06-05: 4 mg via INTRAVENOUS
  Filled 2017-06-05: qty 2

## 2017-06-05 MED ORDER — IOPAMIDOL (ISOVUE-300) INJECTION 61%
INTRAVENOUS | Status: AC
  Administered 2017-06-05: 150 mL
  Filled 2017-06-05: qty 150

## 2017-06-05 MED ORDER — ONDANSETRON HCL 4 MG PO TABS: 4.0000 mg | ORAL_TABLET | Freq: Four times a day (QID) | ORAL | Status: DC | PRN

## 2017-06-05 MED ORDER — DEXTROSE 5 % IV SOLN
500.0000 mg | INTRAVENOUS | Status: DC
  Administered 2017-06-05: 500 mg via INTRAVENOUS
  Filled 2017-06-05: qty 500

## 2017-06-05 MED ORDER — ACETAMINOPHEN 650 MG RE SUPP: 650.0000 mg | Freq: Four times a day (QID) | RECTAL | Status: DC | PRN

## 2017-06-05 MED ORDER — ACETAMINOPHEN 325 MG PO TABS
650.0000 mg | ORAL_TABLET | Freq: Four times a day (QID) | ORAL | Status: DC | PRN
  Administered 2017-06-17 – 2017-07-18 (×22): 650 mg via ORAL
  Filled 2017-06-05 (×26): qty 2

## 2017-06-05 MED ORDER — SODIUM CHLORIDE 0.9 % IV BOLUS (SEPSIS)
1000.0000 mL | Freq: Once | INTRAVENOUS | Status: AC
  Administered 2017-06-05: 1000 mL via INTRAVENOUS

## 2017-06-05 MED ORDER — DEXTROSE 5 % IV SOLN
1.0000 g | INTRAVENOUS | Status: DC
  Administered 2017-06-05: 1 g via INTRAVENOUS
  Filled 2017-06-05: qty 10

## 2017-06-05 MED ORDER — MORPHINE SULFATE (PF) 4 MG/ML IV SOLN
4.0000 mg | INTRAVENOUS | Status: DC | PRN
  Administered 2017-06-05 (×2): 4 mg via INTRAVENOUS
  Filled 2017-06-05 (×2): qty 1

## 2017-06-05 NOTE — ED Triage Notes (Signed)
Pt arrives to ED from [facility] with complaints of throat swelling since 2 days ago. EMS reports pt has abscess pressing on his trachea, pt does not have difficulty breathing but does have shallow breaths r/t pain with deep inspiration. Pt placed in position of comfort with bed locked and lowered, call bell in reach.

## 2017-06-05 NOTE — ED Notes (Signed)
Patient ambulatory to bathroom with steady gait at this time 

## 2017-06-05 NOTE — ED Provider Notes (Signed)
[facility] EMERGENCY DEPARTMENT Provider Note   CSN: [id number] Arrival date & time: 06/05/17  1356     History   Chief Complaint Chief Complaint  Patient presents with  . Oral Swelling    HPI Clifford Tucker is a 37 y.o. male.  Patient states he initially had left hip pain over 1 week ago that has almost completely resolved.  He then developed pain to his right neck.    Abscess  Location:  Head/neck Head/neck abscess location:  R neck Size:  4cm x 4cm Abscess quality: induration, painful and redness   Duration:  4 days Progression:  Worsening Pain details:    Severity:  Moderate Chronicity:  New Context: injected drug use   Relieved by:  Nothing Ineffective treatments: narcotic pain meds. Associated symptoms: no fever, no nausea and no vomiting     Past Medical History:  Diagnosis Date  . Pneumonia     Patient Active Problem List   Diagnosis Date Noted  . Pneumothorax   . Pneumonia     Past Surgical History:  Procedure Laterality Date  . SHOULDER SURGERY         Home Medications    Prior to Admission medications   Medication Sig Start Date End Date Taking? Authorizing Provider  Aspirin-Salicylamide-Caffeine (BC HEADACHE POWDER PO) Take 2-6 packets by mouth daily as needed (pain).   Yes [provider]  HYDROcodone-acetaminophen (NORCO/VICODIN) 5-325 MG per tablet 1 or 2 tabs PO q6 hours prn pain Patient taking differently: Take 1-2 tablets by mouth every 6 (six) hours as needed for moderate pain.  10/03/12  Yes Samuel Jester, DO  ibuprofen (ADVIL,MOTRIN) 200 MG tablet Take 200-400 mg by mouth every 6 (six) hours as needed for mild pain.   Yes [provider]    Family History History reviewed. No pertinent family history.  Social History Social History   Tobacco Use  . Smoking status: Current Every Day Smoker    Packs/day: 1.00  . Smokeless tobacco: Never Used  Substance Use Topics  . Alcohol  use: No  . Drug use: Yes    Types: Marijuana    Comment: 06/05/17     Allergies   Patient has no known allergies.   Review of Systems Review of Systems  Constitutional: Negative for chills and fever.  HENT: Negative for ear pain and sore throat.   Eyes: Negative for pain and visual disturbance.  Respiratory: Positive for cough (chronic). Negative for shortness of breath.   Cardiovascular: Negative for chest pain and palpitations.  Gastrointestinal: Negative for abdominal pain, nausea and vomiting.  Genitourinary: Negative for dysuria and hematuria.  Musculoskeletal: Negative for arthralgias and back pain.  Skin: Negative for color change and rash.  Neurological: Negative for seizures and syncope.  All other systems reviewed and are negative.    Physical Exam Updated Vital Signs BP 124/74 (BP Location: Right Arm)   Pulse 94   Temp 98.3 F (36.8 C) (Oral)   Resp 16   Ht 5\' 11"  (1.803 m)   Wt 68 kg (150 lb)   SpO2 99%   BMI 20.92 kg/m   Physical Exam  Constitutional: He appears well-developed and well-nourished.  HENT:  Head: Normocephalic and atraumatic.  Eyes: Conjunctivae are normal.  Neck:  Erythema, induration and ttp over right lower neck, near clavicle.  Cardiovascular: Normal rate and regular rhythm.  Pulmonary/Chest: Effort normal and breath sounds normal. No respiratory distress.  Abdominal: Soft. There is no tenderness.  Musculoskeletal:  He exhibits no edema.  Patient is able to stand and walk without difficulty. FROM to left hip.  Neurological: He is alert.  Skin: Skin is warm and dry.  Psychiatric: He has a normal mood and affect.  Nursing note and vitals reviewed.    ED Treatments / Results  Labs (all labs ordered are listed, but only abnormal results are displayed) Labs Reviewed  COMPREHENSIVE METABOLIC PANEL - Abnormal; Notable for the following components:      Result Value   Chloride 96 (*)    Glucose, Bld 109 (*)    Albumin 2.9 (*)     AST 67 (*)    Alkaline Phosphatase 201 (*)    All other components within normal limits  CBC WITH DIFFERENTIAL/PLATELET - Abnormal; Notable for the following components:   WBC 23.0 (*)    Neutro Abs 18.8 (*)    Monocytes Absolute 2.3 (*)    All other components within normal limits  I-STAT CHEM 8, ED - Abnormal; Notable for the following components:   Chloride 95 (*)    Glucose, Bld 106 (*)    Calcium, Ion 1.05 (*)    All other components within normal limits  CULTURE, BLOOD (ROUTINE X 2)  CULTURE, BLOOD (ROUTINE X 2)  CBC WITH DIFFERENTIAL/PLATELET  I-STAT CG4 LACTIC ACID, ED    EKG  EKG Interpretation None       Radiology Dg Chest 2 View  Result Date: 06/05/2017 CLINICAL DATA:  Shortness of Breath EXAM: CHEST  2 VIEW COMPARISON:  December 01, 2011 FINDINGS: There is patchy infiltrate in the lateral right base. Lungs elsewhere are clear. Heart size and pulmonary vascularity are normal. No adenopathy. No bone lesions. IMPRESSION: Infiltrate consistent with pneumonia lateral right base. Lungs elsewhere clear. Cardiac silhouette within normal limits. Electronically Signed   By: Bretta Bang III M.D.   On: 06/05/2017 14:47   Ct Soft Tissue Neck W Contrast  Result Date: 06/05/2017 CLINICAL DATA:  Neck mass.  Hard, red, raised area in the neck. EXAM: CT NECK WITH CONTRAST TECHNIQUE: Multidetector CT imaging of the neck was performed using the standard protocol following the bolus administration of intravenous contrast. CONTRAST:  ISOVUE-300 IOPAMIDOL (ISOVUE-300) INJECTION 61% COMPARISON:  None. FINDINGS: Pharynx and larynx: No pharyngeal or laryngeal mass. Patent airway. No retropharyngeal fluid collection. Salivary glands: No inflammation, mass, or stone. Thyroid: Mass effect on the right thyroid lobe by the inflammatory process described below. No intrinsic thyroid abnormality identified. Lymph nodes: Subcentimeter but asymmetric anterior and posterior cervical lymph nodes  throughout the right neck, likely reactive. Vascular: Major vascular structures of the neck are patent. The below described inflammatory process extends into the right carotid space in the lower neck. Limited intracranial: Unremarkable. Visualized orbits: Not imaged. Mastoids and visualized paranasal sinuses: Partially visualized polypoid mucosal thickening or small mucous retention cyst in the left maxillary sinus. Visualized mastoid air cells are clear. Skeleton: There is a heterogeneous gas and fluid collection along the medial aspect of the right sternocleidomastoid muscle in the lower neck beginning near the clavicular head. This collection extends posteriorly and inferiorly into the anterior mediastinum, more fully evaluated on the separate chest CT. Phlegmon extends superiorly within/along the right sternocleidomastoid muscle in the lower and mid neck. Fluid extends superiorly in the right neck deep to the strap muscles and contains a few locules of gas, however this fluid does not appear as organized as the collections in the lower neck and upper chest. Inflammation in the right  neck extends just superior to the level of the thyroid cartilage. No erosive changes are seen at the right sternoclavicular joint. Upper chest: Reported separately. Other: None. IMPRESSION: Gas and fluid collections in the anterior upper chest as reported on separate chest CT, concerning for abscesses. Phlegmon and less organized gas and fluid extend superiorly in the anterior right neck with suspected myositis of the right sternocleidomastoid muscle. Electronically Signed   By: Sebastian AcheAllen  Grady M.D.   On: 06/05/2017 16:31   Ct Chest W Contrast  Result Date: 06/05/2017 CLINICAL DATA:  Patient with shortness of breath. Red raised area anterior lower neck. EXAM: CT CHEST WITH CONTRAST TECHNIQUE: Multidetector CT imaging of the chest was performed during intravenous contrast administration. CONTRAST:  150mL ISOVUE-300 IOPAMIDOL  (ISOVUE-300) INJECTION 61% COMPARISON:  Chest CT 06/21/2005; neck CT same day. FINDINGS: Cardiovascular: Normal heart size. No pericardial effusion. Normal caliber thoracic aorta. Enlarged main pulmonary artery (3.3 cm) as can be seen with pulmonary arterial hypertension. Mediastinum/Nodes: 1.2 cm AP window lymph node (image 50; series 2). 7 mm right paratracheal lymph node (image 50; series 2). Normal appearance of the esophagus. No axillary adenopathy. Lungs/Pleura: Central airways are patent. Subpleural consolidation with adjacent tree-in-bud ground-glass nodularity within the right lower lobe (image 114; series 4). Within the right upper lobe there multiple areas of regional ground-glass attenuation. No pleural effusion or pneumothorax. Upper Abdomen: No acute process. Musculoskeletal: There is a large centrally necrotic peripherally enhancing mass within the lower neck which extends from the anterior aspect of the right sternal clavicular joint/distal right sternocleidomastoid superiorly along the right aspect of the thyroid and inferiorly within the retrosternal location. The largest portion of this mass measures 4.7 x 4.1 cm. There are multiple small foci of gas within the mass. No definite osseous destruction demonstrated within the right sternoclavicular joint. IMPRESSION: 1. There is a large peripherally enhancing mass with central fluid and gas within the right lower neck extending from the anterior aspect of the right sternoclavicular location posteriorly along the retrosternal soft tissues. Findings are concerning for abscess. No definite osseous destruction at this time of the right sternoclavicular joint or adjacent costochondral joint however secondary infection of these joints is not excluded given the size of the suspected abscess. 2. Consolidation within the right lower lobe with right upper lobe ground-glass opacities favored to represent multifocal pneumonia. 3. Mediastinal and right hilar  adenopathy likely reactive in etiology. Critical Value/emergent results were called by telephone at the time of interpretation on 06/05/2017 at 4:18 pm to Dr. Fayrene FearingJames, who verbally acknowledged these results. Electronically Signed   By: Annia Beltrew  Davis M.D.   On: 06/05/2017 16:29    Procedures Procedures (including critical care time)  Medications Ordered in ED Medications  morphine 4 MG/ML injection 4 mg (4 mg Intravenous Given 06/05/17 1527)  sodium chloride 0.9 % bolus 1,000 mL (0 mLs Intravenous Stopped 06/05/17 1633)  ondansetron (ZOFRAN) injection 4 mg (4 mg Intravenous Given 06/05/17 1527)  piperacillin-tazobactam (ZOSYN) IVPB 3.375 g (0 g Intravenous Stopped 06/05/17 1557)  iopamidol (ISOVUE-300) 61 % injection (150 mLs  Contrast Given 06/05/17 1535)  vancomycin (VANCOCIN) 1,500 mg in sodium chloride 0.9 % 500 mL IVPB (0 mg Intravenous Stopped 06/05/17 1855)     Initial Impression / Assessment and Plan / ED Course  I have reviewed the triage vital signs and the nursing notes.  Pertinent labs & imaging results that were available during my care of the patient were reviewed by me and considered in my medical  decision making (see chart for details).  Clinical Course as of Jun 05 1950  Mon Jun 05, 2017  1635 CT Chest W Contrast [MJ]  1636 CT Chest W Contrast [MJ]    Clinical Course User Index [MJ] Rolland Porter, MD    Mr. Langhorst is a 37 year old male with past medical history significant for IV drug abuse, pneumonia who presents for pain and swelling to his lower neck.  The patient was seen in outside hospital and transferred to the emergency department for CT surgery evaluation.  Prior to transfer, the patient received broad-spectrum antibiotics.  Labs were unremarkable for significant leukocytosis.  Pain is controlled with p.o. pain medications.  CT chest demonstrates multifocal pneumonia and lower neck mass approximately 4.7 x 4.1 cm.  Gas is noted within the mass and it is consistent  with abscess.  CT surgery consulted and plans for operative drainage on Wednesday.  Patient is admitted to hospitalist.  Final Clinical Impressions(s) / ED Diagnoses   Final diagnoses:  Chest wall abscess    ED Discharge Orders    None       Garey Ham, MD 06/05/17 2343    Tegeler, Canary Brim, MD 06/06/17 [phone]

## 2017-06-05 NOTE — Progress Notes (Signed)
Pharmacy Note:  Initial antibiotic(s) regimen of Vancomcyin ordered by EDP to treat Neck/Chest Abscess.  Estimated Creatinine Clearance: 124 mL/min (by C-G formula based on SCr of 0.7 mg/dL).   No Known Allergies  Vitals:   06/05/17 1700 06/05/17 1730  BP: 105/69 103/67  Pulse: 93 (!) 119  Resp: 12 16  Temp:    SpO2: 95% 93%    Anti-infectives (From admission, onward)   Start     Dose/Rate Route Frequency Ordered Stop   06/05/17 1600  vancomycin (VANCOCIN) 1,500 mg in sodium chloride 0.9 % 500 mL IVPB     1,500 mg 250 mL/hr over 120 Minutes Intravenous  Once 06/05/17 1542     06/05/17 1515  piperacillin-tazobactam (ZOSYN) IVPB 3.375 g     3.375 g 100 mL/hr over 30 Minutes Intravenous  Once 06/05/17 1512 06/05/17 1557      Plan: Initial dose(s) of Vancomycin 1500mg  X 1 ordered. F/U admission orders for further dosing if therapy continued.  Mady GemmaHayes, Dannon Perlow R, [facility] Surgery CenterRPH 06/05/2017 6:20 PM

## 2017-06-05 NOTE — ED Triage Notes (Signed)
PT c/o hard red raised around to anterior front of neck worsening over the past causing shortness of breath.

## 2017-06-05 NOTE — ED Notes (Signed)
Re paged Cardio Thoracic surg. Per MD

## 2017-06-05 NOTE — ED Provider Notes (Addendum)
[facility] EMERGENCY DEPARTMENT Provider Note   CSN: [id number] Arrival date & time: 06/05/17  1356     History   Chief Complaint Chief Complaint  Patient presents with  . Oral Swelling    HPI Clifford Tucker is a 37 y.o. male. Chief complaint is back and chest pain.  HPI: Healthy male.  Some mild chronic back pain.  States about 3 days ago he started having some pain on his right anterior chest near his clavicle.  Within 12 or 24-hour started noticing redness and swelling in this area.  Is now become red and swollen.  He has right-sided neck pain.  He has pain moving his neck.  He has had no recent neck procedures or shoulder injections.  No neck injections.  Denies IV drug use.  Denies fevers.  However he states he feels weak and "terrible".  No cough.  No headache right-sided neck pain right-sided chest pain.  No nausea or vomiting.  No upper extremity symptoms.  No past similar episodes.  Past Medical History:  Diagnosis Date  . Pneumonia     Patient Active Problem List   Diagnosis Date Noted  . Pneumothorax   . Pneumonia     Past Surgical History:  Procedure Laterality Date  . SHOULDER SURGERY         Home Medications    Prior to Admission medications   Medication Sig Start Date End Date Taking? Authorizing Provider  Aspirin-Salicylamide-Caffeine (BC HEADACHE POWDER PO) Take 2-6 packets by mouth daily as needed (pain).   Yes [provider]  HYDROcodone-acetaminophen (NORCO/VICODIN) 5-325 MG per tablet 1 or 2 tabs PO q6 hours prn pain Patient taking differently: Take 1-2 tablets by mouth every 6 (six) hours as needed for moderate pain.  10/03/12  Yes Samuel Jester, DO  ibuprofen (ADVIL,MOTRIN) 200 MG tablet Take 200-400 mg by mouth every 6 (six) hours as needed for mild pain.   Yes [provider]    Family History History reviewed. No pertinent family history.  Social History Social History   Tobacco Use  . Smoking status:  Current Every Day Smoker    Packs/day: 1.00  . Smokeless tobacco: Never Used  Substance Use Topics  . Alcohol use: No  . Drug use: Yes    Types: Marijuana    Comment: 06/05/17     Allergies   Patient has no known allergies.   Review of Systems Review of Systems  Constitutional: Positive for fatigue. Negative for appetite change, chills, diaphoresis and fever.       Malaise  HENT: Negative for mouth sores, sore throat and trouble swallowing.   Eyes: Negative for visual disturbance.  Respiratory: Negative for cough, chest tightness, shortness of breath and wheezing.   Cardiovascular: Positive for chest pain.       Swelling and redness on right anterior chest  Gastrointestinal: Negative for abdominal distention, abdominal pain, diarrhea, nausea and vomiting.  Endocrine: Negative for polydipsia, polyphagia and polyuria.  Genitourinary: Negative for dysuria, frequency and hematuria.  Musculoskeletal: Positive for neck pain. Negative for gait problem.  Skin: Negative for color change, pallor and rash.  Neurological: Negative for dizziness, syncope, light-headedness and headaches.  Hematological: Does not bruise/bleed easily.  Psychiatric/Behavioral: Negative for behavioral problems and confusion.     Physical Exam Updated Vital Signs BP 102/67   Pulse 94   Temp 97.7 F (36.5 C) (Oral)   Resp 12   Ht 5\' 11"  (1.803 m)   Wt 68 kg (150 lb)  SpO2 95%   BMI 20.92 kg/m   Physical Exam  Constitutional: He is oriented to person, place, and time. He appears well-developed and well-nourished. No distress.  HENT:  Head: Normocephalic.  Eyes: Conjunctivae are normal. Pupils are equal, round, and reactive to light. No scleral icterus.  Neck: Normal range of motion. Neck supple. No thyromegaly present.  Cardiovascular: Normal rate and regular rhythm. Exam reveals no gallop and no friction rub.  No murmur heard. Pulmonary/Chest: Effort normal and breath sounds normal. No respiratory  distress. He has no wheezes. He has no rales.    Abdominal: Soft. Bowel sounds are normal. He exhibits no distension. There is no tenderness. There is no rebound.  Musculoskeletal: Normal range of motion.  Neurological: He is alert and oriented to person, place, and time.  Skin: Skin is warm and dry. No rash noted.  Psychiatric: He has a normal mood and affect. His behavior is normal.     ED Treatments / Results  Labs (all labs ordered are listed, but only abnormal results are displayed) Labs Reviewed  COMPREHENSIVE METABOLIC PANEL - Abnormal; Notable for the following components:      Result Value   Chloride 96 (*)    Glucose, Bld 109 (*)    Albumin 2.9 (*)    AST 67 (*)    Alkaline Phosphatase 201 (*)    All other components within normal limits  CBC WITH DIFFERENTIAL/PLATELET - Abnormal; Notable for the following components:   WBC 23.0 (*)    Neutro Abs 18.8 (*)    Monocytes Absolute 2.3 (*)    All other components within normal limits  I-STAT CHEM 8, ED - Abnormal; Notable for the following components:   Chloride 95 (*)    Glucose, Bld 106 (*)    Calcium, Ion 1.05 (*)    All other components within normal limits  CULTURE, BLOOD (ROUTINE X 2)  CULTURE, BLOOD (ROUTINE X 2)  CBC WITH DIFFERENTIAL/PLATELET  I-STAT CG4 LACTIC ACID, ED    EKG  EKG Interpretation None       Radiology Dg Chest 2 View  Result Date: 06/05/2017 CLINICAL DATA:  Shortness of Breath EXAM: CHEST  2 VIEW COMPARISON:  December 01, 2011 FINDINGS: There is patchy infiltrate in the lateral right base. Lungs elsewhere are clear. Heart size and pulmonary vascularity are normal. No adenopathy. No bone lesions. IMPRESSION: Infiltrate consistent with pneumonia lateral right base. Lungs elsewhere clear. Cardiac silhouette within normal limits. Electronically Signed   By: Bretta BangWilliam  Woodruff III M.D.   On: 06/05/2017 14:47   Ct Soft Tissue Neck W Contrast  Result Date: 06/05/2017 CLINICAL DATA:  Neck mass.   Hard, red, raised area in the neck. EXAM: CT NECK WITH CONTRAST TECHNIQUE: Multidetector CT imaging of the neck was performed using the standard protocol following the bolus administration of intravenous contrast. CONTRAST:  150mL ISOVUE-300 IOPAMIDOL (ISOVUE-300) INJECTION 61% COMPARISON:  None. FINDINGS: Pharynx and larynx: No pharyngeal or laryngeal mass. Patent airway. No retropharyngeal fluid collection. Salivary glands: No inflammation, mass, or stone. Thyroid: Mass effect on the right thyroid lobe by the inflammatory process described below. No intrinsic thyroid abnormality identified. Lymph nodes: Subcentimeter but asymmetric anterior and posterior cervical lymph nodes throughout the right neck, likely reactive. Vascular: Major vascular structures of the neck are patent. The below described inflammatory process extends into the right carotid space in the lower neck. Limited intracranial: Unremarkable. Visualized orbits: Not imaged. Mastoids and visualized paranasal sinuses: Partially visualized polypoid mucosal thickening or  small mucous retention cyst in the left maxillary sinus. Visualized mastoid air cells are clear. Skeleton: There is a heterogeneous gas and fluid collection along the medial aspect of the right sternocleidomastoid muscle in the lower neck beginning near the clavicular head. This collection extends posteriorly and inferiorly into the anterior mediastinum, more fully evaluated on the separate chest CT. Phlegmon extends superiorly within/along the right sternocleidomastoid muscle in the lower and mid neck. Fluid extends superiorly in the right neck deep to the strap muscles and contains a few locules of gas, however this fluid does not appear as organized as the collections in the lower neck and upper chest. Inflammation in the right neck extends just superior to the level of the thyroid cartilage. No erosive changes are seen at the right sternoclavicular joint. Upper chest: Reported  separately. Other: None. IMPRESSION: Gas and fluid collections in the anterior upper chest as reported on separate chest CT, concerning for abscesses. Phlegmon and less organized gas and fluid extend superiorly in the anterior right neck with suspected myositis of the right sternocleidomastoid muscle. Electronically Signed   By: Sebastian Ache M.D.   On: 06/05/2017 16:31   Ct Chest W Contrast  Result Date: 06/05/2017 CLINICAL DATA:  Patient with shortness of breath. Red raised area anterior lower neck. EXAM: CT CHEST WITH CONTRAST TECHNIQUE: Multidetector CT imaging of the chest was performed during intravenous contrast administration. CONTRAST:  ISOVUE-300 IOPAMIDOL (ISOVUE-300) INJECTION 61% COMPARISON:  Chest CT 06/21/2005; neck CT same day. FINDINGS: Cardiovascular: Normal heart size. No pericardial effusion. Normal caliber thoracic aorta. Enlarged main pulmonary artery (3.3 cm) as can be seen with pulmonary arterial hypertension. Mediastinum/Nodes: 1.2 cm AP window lymph node (image 50; series 2). 7 mm right paratracheal lymph node (image 50; series 2). Normal appearance of the esophagus. No axillary adenopathy. Lungs/Pleura: Central airways are patent. Subpleural consolidation with adjacent tree-in-bud ground-glass nodularity within the right lower lobe (image 114; series 4). Within the right upper lobe there multiple areas of regional ground-glass attenuation. No pleural effusion or pneumothorax. Upper Abdomen: No acute process. Musculoskeletal: There is a large centrally necrotic peripherally enhancing mass within the lower neck which extends from the anterior aspect of the right sternal clavicular joint/distal right sternocleidomastoid superiorly along the right aspect of the thyroid and inferiorly within the retrosternal location. The largest portion of this mass measures 4.7 x 4.1 cm. There are multiple small foci of gas within the mass. No definite osseous destruction demonstrated within the  right sternoclavicular joint. IMPRESSION: 1. There is a large peripherally enhancing mass with central fluid and gas within the right lower neck extending from the anterior aspect of the right sternoclavicular location posteriorly along the retrosternal soft tissues. Findings are concerning for abscess. No definite osseous destruction at this time of the right sternoclavicular joint or adjacent costochondral joint however secondary infection of these joints is not excluded given the size of the suspected abscess. 2. Consolidation within the right lower lobe with right upper lobe ground-glass opacities favored to represent multifocal pneumonia. 3. Mediastinal and right hilar adenopathy likely reactive in etiology. Critical Value/emergent results were called by telephone at the time of interpretation on 06/05/2017 at 4:18 pm to Dr. Fayrene Fearing, who verbally acknowledged these results. Electronically Signed   By: Annia Belt M.D.   On: 06/05/2017 16:29    Procedures Procedures (including critical care time)  Medications Ordered in ED Medications  morphine 4 MG/ML injection 4 mg (4 mg Intravenous Given 06/05/17 1527)  vancomycin (VANCOCIN) 1,500  mg in sodium chloride 0.9 % 500 mL IVPB (not administered)  sodium chloride 0.9 % bolus 1,000 mL (0 mLs Intravenous Stopped 06/05/17 1633)  ondansetron (ZOFRAN) injection 4 mg (4 mg Intravenous Given 06/05/17 1527)  piperacillin-tazobactam (ZOSYN) IVPB 3.375 g (0 g Intravenous Stopped 06/05/17 1557)  iopamidol (ISOVUE-300) 61 % injection (150 mLs  Contrast Given 06/05/17 1535)     Initial Impression / Assessment and Plan / ED Course  I have reviewed the triage vital signs and the nursing notes.  Pertinent labs & imaging results that were available during my care of the patient were reviewed by me and considered in my medical decision making (see chart for details).  Clinical Course as of Jun 05 1724  Mon Jun 05, 2017  1635 CT Chest W Contrast [MJ]  1636 CT Chest W  Contrast [MJ]    Clinical Course User Index [MJ] Rolland Porter, MD   Blood cultures are obtained.  He is afebrile on oral temperature.  X-rays suggest possible right lower lobe infiltrate.  CT scanning being further evaluated by radiology.  However early phone call describes abscess anterior, and posterior to right  joint with enhancing fluid into the neck around the thyroid.  Slightly displacing the airway, but no compromise of the airway itself.  Patient was given Decadron upon his arrival.  After cultures were obtained he was given broad-spectrum antibiotics.  We will discussed the case with CT surgery to further coordinate care plan  Final Clinical Impressions(s) / ED Diagnoses   Final diagnoses:  Chest wall abscess    ED Discharge Orders    None      Discussed with Dr. Ellery Plunk.  He would like the patient transferred to [facility] ER where he can see and and further evaluate the patient.  I discussed the case with my partner Dr. Clarice Pole.  Patient will be transferred.   Rolland Porter, MD 06/05/17 1635    Rolland Porter, MD 06/05/17 [phone]

## 2017-06-05 NOTE — ED Notes (Signed)
ED Provider at bedside. 

## 2017-06-05 NOTE — H&P (Signed)
History and Physical    Clifford Tucker MVH:[id number] DOB: 03-Aug-1980 DOA: 06/05/2017  PCP: Patient, No Pcp Per   Patient coming from: Home  Chief Complaint: Right neck pain, left hip pain, SOB  HPI: Clifford Tucker is a 37 y.o. male with medical history significant for IV drug abuse and recurrent skin and soft tissue infections, now presenting to the emergency department for evaluation of severe pain with swelling in the right anterior neck, as well as shortness of breath and cough.  Patient reports that he had been suffering from severe left hip pain for the past couple weeks and was provided with crutches and analgesia by an outpatient physician.  He then went on to develop a small area of redness, swelling, and pain near the right clavicle approximately 5 days ago.  Over the ensuing days, the redness, swelling, and pain all increased dramatically.  He reports subjective fevers.  Reports worsening dyspnea over the same interval and development of a mild, occasionally productive cough.  ED Course: Upon arrival to the ED, patient is found to be afebrile, saturating well on room air, slightly tachypneic, tachycardic, and with stable blood pressure.  Chest x-ray is notable for an infiltrate at the right base concerning for pneumonia.  Chemistry panel features mild elevation in AST and alkaline phosphatase.  CBC is notable for a leukocytosis to 23,000 with mild left shift.  Lactic acid is reassuring at 0.82.  CT surgery was consulted and recommended medical admission, indicating that they will plan to operate on 06/07/2017.  Blood cultures were collected, 1 L of normal saline was given, and the patient was started on empiric vancomycin and Zosyn in the ED he remained hemodynamically stable, in no acute respiratory distress, and will be admitted to the medical-surgical unit for ongoing evaluation and right neck abscess, multifocal pneumonia, and left hip pain in an IV drug abuser.  Review of Systems:    All other systems reviewed and apart from HPI, are negative.  Past Medical History:  Diagnosis Date  . Pneumonia     Past Surgical History:  Procedure Laterality Date  . SHOULDER SURGERY       reports that he has been smoking.  He has been smoking about 1.00 pack per day. he has never used smokeless tobacco. He reports that he uses drugs. Drug: Marijuana. He reports that he does not drink alcohol.  No Known Allergies  Family History  Problem Relation Age of Onset  . Drug abuse Mother      Prior to Admission medications   Medication Sig Start Date End Date Taking? Authorizing Provider  Aspirin-Salicylamide-Caffeine (BC HEADACHE POWDER PO) Take 2-6 packets by mouth daily as needed (pain).   Yes [provider]  HYDROcodone-acetaminophen (NORCO/VICODIN) 5-325 MG per tablet 1 or 2 tabs PO q6 hours prn pain Patient taking differently: Take 1-2 tablets by mouth every 6 (six) hours as needed for moderate pain.  10/03/12  Yes Samuel Jester, DO  ibuprofen (ADVIL,MOTRIN) 200 MG tablet Take 200-400 mg by mouth every 6 (six) hours as needed for mild pain.   Yes [provider]    Physical Exam: Vitals:   06/05/17 2030 06/05/17 2115 06/05/17 2130 06/05/17 2145  BP: 125/72 (!) 123/59 126/64 113/63  Pulse: (!) 109 (!) 118 (!) 116 (!) 113  Resp: 14 18 16 11   Temp:    99.4 F (37.4 C)  TempSrc:    Oral  SpO2: 94% 91% (!) 89% 93%  Weight:  Height:          Constitutional: NAD, calm, in obvious discomfort Eyes: PERTLA, lids and conjunctivae normal ENMT: Mucous membranes are moist. Posterior pharynx clear of any exudate or lesions.   Neck: Right lower anterior neck with large focal swelling with overlying erythema, exquisite tenderness, and fluctuance. No drainage.  Respiratory: Right basilar rhonchi, no wheezing, no crackles. No accessory muscle use.  Cardiovascular: Rate ~110 and regular. No extremity edema. Abdomen: No distension, no tenderness, no masses  palpated. Bowel sounds normal.  Musculoskeletal: no clubbing / cyanosis. No joint deformity upper and lower extremities.    Skin: Neck findings above. Several puncture wounds overlying venous distributions of distal LUE. Warm, dry, well-perfused. Neurologic: CN 2-12 grossly intact. Sensation intact. Strength 5/5 in all 4 limbs.  Psychiatric: Alert and oriented x 3. Calm, cooperative.     Labs on Admission: I have personally reviewed following labs and imaging studies  CBC: Recent Labs  Lab 06/05/17 1444 06/05/17 1457  WBC 23.0*  --   NEUTROABS 18.8*  --   HGB 13.3 14.6  HCT 39.4 43.0  MCV 90.6  --   PLT 318  --    Basic Metabolic Panel: Recent Labs  Lab 06/05/17 1444 06/05/17 1457  NA 137 135  K 3.7 3.6  CL 96* 95*  CO2 27  --   GLUCOSE 109* 106*  BUN 9 6  CREATININE 0.66 0.70  CALCIUM 9.0  --    GFR: Estimated Creatinine Clearance: 124 mL/min (by C-G formula based on SCr of 0.7 mg/dL). Liver Function Tests: Recent Labs  Lab 06/05/17 1444  AST 67*  ALT 63  ALKPHOS 201*  BILITOT 0.5  PROT 7.5  ALBUMIN 2.9*   No results for input(s): LIPASE, AMYLASE in the last 168 hours. No results for input(s): AMMONIA in the last 168 hours. Coagulation Profile: No results for input(s): INR, PROTIME in the last 168 hours. Cardiac Enzymes: No results for input(s): CKTOTAL, CKMB, CKMBINDEX, TROPONINI in the last 168 hours. BNP (last 3 results) No results for input(s): PROBNP in the last 8760 hours. HbA1C: No results for input(s): HGBA1C in the last 72 hours. CBG: No results for input(s): GLUCAP in the last 168 hours. Lipid Profile: No results for input(s): CHOL, HDL, LDLCALC, TRIG, CHOLHDL, LDLDIRECT in the last 72 hours. Thyroid Function Tests: No results for input(s): TSH, T4TOTAL, FREET4, T3FREE, THYROIDAB in the last 72 hours. Anemia Panel: No results for input(s): VITAMINB12, FOLATE, FERRITIN, TIBC, IRON, RETICCTPCT in the last 72 hours. Urine analysis:      Component Value Date/Time   COLORURINE YELLOW 10/03/2012 0856   APPEARANCEUR CLEAR 10/03/2012 0856   LABSPEC 1.015 10/03/2012 0856   PHURINE 6.0 10/03/2012 0856   GLUCOSEU NEGATIVE 10/03/2012 0856   HGBUR NEGATIVE 10/03/2012 0856   BILIRUBINUR NEGATIVE 10/03/2012 0856   KETONESUR NEGATIVE 10/03/2012 0856   PROTEINUR NEGATIVE 10/03/2012 0856   UROBILINOGEN 0.2 10/03/2012 0856   NITRITE NEGATIVE 10/03/2012 0856   LEUKOCYTESUR NEGATIVE 10/03/2012 0856   Sepsis Labs: @LABRCNTIP (procalcitonin:4,lacticidven:4) ) Recent Results (from the past 240 hour(s))  Blood culture (routine x 2)     Status: None (Preliminary result)   Collection Time: 06/05/17  2:44 PM  Result Value Ref Range Status   Specimen Description RIGHT ANTECUBITAL  Final   Special Requests   Final    BOTTLES DRAWN AEROBIC ONLY Blood Culture adequate volume   Culture PENDING  Incomplete   Report Status PENDING  Incomplete  Blood culture (routine x 2)  Status: None (Preliminary result)   Collection Time: 06/05/17  2:47 PM  Result Value Ref Range Status   Specimen Description LEFT ANTECUBITAL  Final   Special Requests   Final    BOTTLES DRAWN AEROBIC AND ANAEROBIC Blood Culture adequate volume   Culture PENDING  Incomplete   Report Status PENDING  Incomplete     Radiological Exams on Admission: Dg Chest 2 View  Result Date: 06/05/2017 CLINICAL DATA:  Shortness of Breath EXAM: CHEST  2 VIEW COMPARISON:  December 01, 2011 FINDINGS: There is patchy infiltrate in the lateral right base. Lungs elsewhere are clear. Heart size and pulmonary vascularity are normal. No adenopathy. No bone lesions. IMPRESSION: Infiltrate consistent with pneumonia lateral right base. Lungs elsewhere clear. Cardiac silhouette within normal limits. Electronically Signed   By: Bretta Bang III M.D.   On: 06/05/2017 14:47   Ct Soft Tissue Neck W Contrast  Result Date: 06/05/2017 CLINICAL DATA:  Neck mass.  Hard, red, raised area in the neck.  EXAM: CT NECK WITH CONTRAST TECHNIQUE: Multidetector CT imaging of the neck was performed using the standard protocol following the bolus administration of intravenous contrast. CONTRAST:  ISOVUE-300 IOPAMIDOL (ISOVUE-300) INJECTION 61% COMPARISON:  None. FINDINGS: Pharynx and larynx: No pharyngeal or laryngeal mass. Patent airway. No retropharyngeal fluid collection. Salivary glands: No inflammation, mass, or stone. Thyroid: Mass effect on the right thyroid lobe by the inflammatory process described below. No intrinsic thyroid abnormality identified. Lymph nodes: Subcentimeter but asymmetric anterior and posterior cervical lymph nodes throughout the right neck, likely reactive. Vascular: Major vascular structures of the neck are patent. The below described inflammatory process extends into the right carotid space in the lower neck. Limited intracranial: Unremarkable. Visualized orbits: Not imaged. Mastoids and visualized paranasal sinuses: Partially visualized polypoid mucosal thickening or small mucous retention cyst in the left maxillary sinus. Visualized mastoid air cells are clear. Skeleton: There is a heterogeneous gas and fluid collection along the medial aspect of the right sternocleidomastoid muscle in the lower neck beginning near the clavicular head. This collection extends posteriorly and inferiorly into the anterior mediastinum, more fully evaluated on the separate chest CT. Phlegmon extends superiorly within/along the right sternocleidomastoid muscle in the lower and mid neck. Fluid extends superiorly in the right neck deep to the strap muscles and contains a few locules of gas, however this fluid does not appear as organized as the collections in the lower neck and upper chest. Inflammation in the right neck extends just superior to the level of the thyroid cartilage. No erosive changes are seen at the right sternoclavicular joint. Upper chest: Reported separately. Other: None. IMPRESSION: Gas  and fluid collections in the anterior upper chest as reported on separate chest CT, concerning for abscesses. Phlegmon and less organized gas and fluid extend superiorly in the anterior right neck with suspected myositis of the right sternocleidomastoid muscle. Electronically Signed   By: Sebastian Ache M.D.   On: 06/05/2017 16:31   Ct Chest W Contrast  Result Date: 06/05/2017 CLINICAL DATA:  Patient with shortness of breath. Red raised area anterior lower neck. EXAM: CT CHEST WITH CONTRAST TECHNIQUE: Multidetector CT imaging of the chest was performed during intravenous contrast administration. CONTRAST:  ISOVUE-300 IOPAMIDOL (ISOVUE-300) INJECTION 61% COMPARISON:  Chest CT 06/21/2005; neck CT same day. FINDINGS: Cardiovascular: Normal heart size. No pericardial effusion. Normal caliber thoracic aorta. Enlarged main pulmonary artery (3.3 cm) as can be seen with pulmonary arterial hypertension. Mediastinum/Nodes: 1.2 cm AP window lymph node (image  50; series 2). 7 mm right paratracheal lymph node (image 50; series 2). Normal appearance of the esophagus. No axillary adenopathy. Lungs/Pleura: Central airways are patent. Subpleural consolidation with adjacent tree-in-bud ground-glass nodularity within the right lower lobe (image 114; series 4). Within the right upper lobe there multiple areas of regional ground-glass attenuation. No pleural effusion or pneumothorax. Upper Abdomen: No acute process. Musculoskeletal: There is a large centrally necrotic peripherally enhancing mass within the lower neck which extends from the anterior aspect of the right sternal clavicular joint/distal right sternocleidomastoid superiorly along the right aspect of the thyroid and inferiorly within the retrosternal location. The largest portion of this mass measures 4.7 x 4.1 cm. There are multiple small foci of gas within the mass. No definite osseous destruction demonstrated within the right sternoclavicular joint. IMPRESSION: 1.  There is a large peripherally enhancing mass with central fluid and gas within the right lower neck extending from the anterior aspect of the right sternoclavicular location posteriorly along the retrosternal soft tissues. Findings are concerning for abscess. No definite osseous destruction at this time of the right sternoclavicular joint or adjacent costochondral joint however secondary infection of these joints is not excluded given the size of the suspected abscess. 2. Consolidation within the right lower lobe with right upper lobe ground-glass opacities favored to represent multifocal pneumonia. 3. Mediastinal and right hilar adenopathy likely reactive in etiology. Critical Value/emergent results were called by telephone at the time of interpretation on 06/05/2017 at 4:18 pm to Dr. Fayrene Fearing, who verbally acknowledged these results. Electronically Signed   By: Annia Belt M.D.   On: 06/05/2017 16:29    EKG: Not performed.   Assessment/Plan  1. Neck abscess  - Presents with 5 days of progressive pain, swelling, erythema at lower right neck anteriorly  - CT-findings consistent with abscess and CT surgery has been consulted, planning for surgery on 06/07/17  - Cultures collected in ED and empiric abx started with vancomycin and Zosyn  - Continue current abx, continue supportive care with prn analgesia    2. Multifocal pneumonia  - Concerning for septic emboli given the clinical scenario   - Blood cultures collected in ED, add sputum culture, check strep pneumo antigen  - Started on vancomycin and Zosyn in ED, azithromycin added for atypical coverage while following pending studies, cultures, clinical course   3. IV drug abuse  - Counseled toward cessation  - HIV and hep panel ordered, follow cultures, continue abx, and check echocardiogram given various skin/soft tissue infections and multifocal PNA   4. Left hip pain - Concerning for infection in this clinical scenario - Check MRI, continue abx,  follow cultures, prn analgesia     DVT prophylaxis: Lovenox Code Status: Full  Family Communication: Discussed with patient Disposition Plan: Admit to med-surg Consults called: CTS Admission status: Inpatient    Briscoe Deutscher, MD [facility] Pager [phone]  If 7PM-7AM, please contact night-coverage [email or web address] Password [facility]  06/05/2017, 10:09 PM

## 2017-06-05 NOTE — Progress Notes (Signed)
Pharmacy Antibiotic Note  Clifford BoschChristopher Tucker is a 37 y.o. male admitted on 06/05/2017 with pneumonia.  Pharmacy has been consulted for Vancomycin and Zosyn dosing. Pt is afebrile. Scr and lactate level WNL. Elevated WBC at 23.  Plan: Vancomycin 1500mg  IV x1 (given at [facility]) Vancomycin 1000mg  IV every 8 hours.  Goal trough 15-20 mcg/mL. Zosyn 3.375g IV x1 ([facility] at [facility]) Zosyn 3.375g IV q8h (4 hour infusion). Azithromycin 500mg  IV q24h per EDP F/u renal fxn, trough @ SS, clinical resolution  Height: 5\' 11"  (180.3 cm) Weight: 150 lb (68 kg) IBW/kg (Calculated) : 75.3  Temp (24hrs), Avg:98 F (36.7 C), Min:97.7 F (36.5 C), Max:98.3 F (36.8 C)  Recent Labs  Lab 06/05/17 1444 06/05/17 1457 06/05/17 1641  WBC 23.0*  --   --   CREATININE 0.66 0.70  --   LATICACIDVEN  --   --  0.82    Estimated Creatinine Clearance: 124 mL/min (by C-G formula based on SCr of 0.7 mg/dL).    No Known Allergies  Antimicrobials this admission: Vanc 1/14 >>  Zosyn 1/14 >>  Azithromycin 1/14 >>  Microbiology results: Bcx 1/14:  Sputum 1/14:  Thank you for allowing pharmacy to be a part of this patient's care.  Tiffane Sheldon 06/05/2017 9:47 PM

## 2017-06-06 ENCOUNTER — Encounter (HOSPITAL_COMMUNITY): Payer: Self-pay | Admitting: *Deleted

## 2017-06-06 ENCOUNTER — Inpatient Hospital Stay (HOSPITAL_COMMUNITY): Payer: Self-pay | Admitting: Certified Registered"

## 2017-06-06 ENCOUNTER — Inpatient Hospital Stay (HOSPITAL_COMMUNITY): Payer: Self-pay

## 2017-06-06 ENCOUNTER — Encounter (HOSPITAL_COMMUNITY)
Admission: EM | Disposition: A | Discharge status: Home / Self Care | Payer: Self-pay | Source: Home / Self Care | Attending: Internal Medicine

## 2017-06-06 ENCOUNTER — Other Ambulatory Visit (HOSPITAL_COMMUNITY): Payer: Self-pay

## 2017-06-06 DIAGNOSIS — D649 Anemia, unspecified: Secondary | ICD-10-CM | POA: Diagnosis present

## 2017-06-06 DIAGNOSIS — K6812 Psoas muscle abscess: Secondary | ICD-10-CM

## 2017-06-06 DIAGNOSIS — B9561 Methicillin susceptible Staphylococcus aureus infection as the cause of diseases classified elsewhere: Secondary | ICD-10-CM

## 2017-06-06 DIAGNOSIS — M25552 Pain in left hip: Secondary | ICD-10-CM

## 2017-06-06 DIAGNOSIS — R7881 Bacteremia: Secondary | ICD-10-CM

## 2017-06-06 DIAGNOSIS — M86011 Acute hematogenous osteomyelitis, right shoulder: Secondary | ICD-10-CM

## 2017-06-06 DIAGNOSIS — M009 Pyogenic arthritis, unspecified: Secondary | ICD-10-CM

## 2017-06-06 DIAGNOSIS — M00851 Arthritis due to other bacteria, right hip: Secondary | ICD-10-CM

## 2017-06-06 DIAGNOSIS — A4901 Methicillin susceptible Staphylococcus aureus infection, unspecified site: Secondary | ICD-10-CM

## 2017-06-06 DIAGNOSIS — J189 Pneumonia, unspecified organism: Secondary | ICD-10-CM

## 2017-06-06 HISTORY — DX: Methicillin susceptible Staphylococcus aureus infection as the cause of diseases classified elsewhere: B95.61

## 2017-06-06 HISTORY — DX: Psoas muscle abscess: K68.12

## 2017-06-06 HISTORY — PX: HIP DEBRIDEMENT: SHX1752

## 2017-06-06 HISTORY — PX: INCISION AND DRAINAGE HIP: SHX1801

## 2017-06-06 HISTORY — DX: Bacteremia: R78.81

## 2017-06-06 HISTORY — DX: Pyogenic arthritis, unspecified: M00.9

## 2017-06-06 LAB — CBC WITH DIFFERENTIAL/PLATELET
BASOS ABS: 0 10*3/uL (ref 0.0–0.1)
BASOS PCT: 0 %
EOS PCT: 2 %
Eosinophils Absolute: 0.3 10*3/uL (ref 0.0–0.7)
HEMATOCRIT: 33.1 % — AB (ref 39.0–52.0)
Hemoglobin: 11 g/dL — ABNORMAL LOW (ref 13.0–17.0)
Lymphocytes Relative: 8 %
Lymphs Abs: 1.6 10*3/uL (ref 0.7–4.0)
MCH: 30 pg (ref 26.0–34.0)
MCHC: 33.2 g/dL (ref 30.0–36.0)
MCV: 90.2 fL (ref 78.0–100.0)
MONO ABS: 2.6 10*3/uL — AB (ref 0.1–1.0)
Monocytes Relative: 13 %
NEUTROS ABS: 15.1 10*3/uL — AB (ref 1.7–7.7)
Neutrophils Relative %: 77 %
PLATELETS: 317 10*3/uL (ref 150–400)
RBC: 3.67 MIL/uL — AB (ref 4.22–5.81)
RDW: 14.8 % (ref 11.5–15.5)
WBC: 19.7 10*3/uL — AB (ref 4.0–10.5)

## 2017-06-06 LAB — BLOOD CULTURE ID PANEL (REFLEXED)
Acinetobacter baumannii: NOT DETECTED
CANDIDA GLABRATA: NOT DETECTED
CANDIDA KRUSEI: NOT DETECTED
Candida albicans: NOT DETECTED
Candida parapsilosis: NOT DETECTED
Candida tropicalis: NOT DETECTED
ENTEROBACTER CLOACAE COMPLEX: NOT DETECTED
ENTEROBACTERIACEAE SPECIES: NOT DETECTED
ENTEROCOCCUS SPECIES: NOT DETECTED
ESCHERICHIA COLI: NOT DETECTED
Haemophilus influenzae: NOT DETECTED
Klebsiella oxytoca: NOT DETECTED
Klebsiella pneumoniae: NOT DETECTED
LISTERIA MONOCYTOGENES: NOT DETECTED
Methicillin resistance: NOT DETECTED
Neisseria meningitidis: NOT DETECTED
PSEUDOMONAS AERUGINOSA: NOT DETECTED
Proteus species: NOT DETECTED
SERRATIA MARCESCENS: NOT DETECTED
STAPHYLOCOCCUS AUREUS BCID: DETECTED — AB
Staphylococcus species: DETECTED — AB
Streptococcus agalactiae: NOT DETECTED
Streptococcus pneumoniae: NOT DETECTED
Streptococcus pyogenes: NOT DETECTED
Streptococcus species: NOT DETECTED

## 2017-06-06 LAB — COMPREHENSIVE METABOLIC PANEL
ALBUMIN: 2.3 g/dL — AB (ref 3.5–5.0)
ALT: 47 U/L (ref 17–63)
AST: 43 U/L — AB (ref 15–41)
Alkaline Phosphatase: 176 U/L — ABNORMAL HIGH (ref 38–126)
Anion gap: 11 (ref 5–15)
BUN: 7 mg/dL (ref 6–20)
CHLORIDE: 99 mmol/L — AB (ref 101–111)
CO2: 24 mmol/L (ref 22–32)
Calcium: 8.1 mg/dL — ABNORMAL LOW (ref 8.9–10.3)
Creatinine, Ser: 0.58 mg/dL — ABNORMAL LOW (ref 0.61–1.24)
GFR calc Af Amer: >60 mL/min (ref >60)
GFR calc non Af Amer: >60 mL/min (ref >60)
GLUCOSE: 82 mg/dL (ref 65–99)
POTASSIUM: 4 mmol/L (ref 3.5–5.1)
Sodium: 134 mmol/L — ABNORMAL LOW (ref 135–145)
Total Bilirubin: 0.3 mg/dL (ref 0.3–1.2)
Total Protein: 6.4 g/dL — ABNORMAL LOW (ref 6.5–8.1)

## 2017-06-06 LAB — URINALYSIS, ROUTINE W REFLEX MICROSCOPIC
Bilirubin Urine: NEGATIVE
Glucose, UA: NEGATIVE mg/dL
Hgb urine dipstick: NEGATIVE
Ketones, ur: NEGATIVE mg/dL
Leukocytes, UA: NEGATIVE
Nitrite: NEGATIVE
Protein, ur: NEGATIVE mg/dL
Specific Gravity, Urine: 1.01 (ref 1.005–1.030)
pH: 6 (ref 5.0–8.0)

## 2017-06-06 LAB — HIV ANTIBODY (ROUTINE TESTING W REFLEX): HIV SCREEN 4TH GENERATION: NONREACTIVE

## 2017-06-06 LAB — STREP PNEUMONIAE URINARY ANTIGEN: STREP PNEUMO URINARY ANTIGEN: NEGATIVE

## 2017-06-06 SURGERY — IRRIGATION AND DEBRIDEMENT HIP
Anesthesia: General | Laterality: Left

## 2017-06-06 MED ORDER — SUCCINYLCHOLINE CHLORIDE 200 MG/10ML IV SOSY
PREFILLED_SYRINGE | INTRAVENOUS | Status: AC
  Filled 2017-06-06: qty 10

## 2017-06-06 MED ORDER — MEPERIDINE HCL 25 MG/ML IJ SOLN: 6.2500 mg | INTRAMUSCULAR | Status: DC | PRN

## 2017-06-06 MED ORDER — SODIUM CHLORIDE 0.9 % IJ SOLN: 10.0000 mL | Freq: Once | INTRAMUSCULAR | Status: DC

## 2017-06-06 MED ORDER — FENTANYL CITRATE (PF) 250 MCG/5ML IJ SOLN
INTRAMUSCULAR | Status: AC
  Filled 2017-06-06: qty 5

## 2017-06-06 MED ORDER — LIDOCAINE 2% (20 MG/ML) 5 ML SYRINGE
INTRAMUSCULAR | Status: AC
  Filled 2017-06-06: qty 5

## 2017-06-06 MED ORDER — KETOROLAC TROMETHAMINE 30 MG/ML IJ SOLN
INTRAMUSCULAR | Status: AC
  Administered 2017-06-06: 30 mg via INTRAVENOUS
  Filled 2017-06-06: qty 1

## 2017-06-06 MED ORDER — LIDOCAINE HCL (CARDIAC) 20 MG/ML IV SOLN
INTRAVENOUS | Status: DC | PRN
  Administered 2017-06-06: 100 mg via INTRAVENOUS

## 2017-06-06 MED ORDER — LACTATED RINGERS IV SOLN
INTRAVENOUS | Status: DC
  Administered 2017-06-06: 14:00:00 via INTRAVENOUS

## 2017-06-06 MED ORDER — POTASSIUM CHLORIDE IN NACL 20-0.9 MEQ/L-% IV SOLN
INTRAVENOUS | Status: AC
  Administered 2017-06-06 – 2017-06-07 (×2): via INTRAVENOUS
  Filled 2017-06-06 (×2): qty 1000

## 2017-06-06 MED ORDER — SUGAMMADEX SODIUM 200 MG/2ML IV SOLN
INTRAVENOUS | Status: AC
  Filled 2017-06-06: qty 2

## 2017-06-06 MED ORDER — METOCLOPRAMIDE HCL 5 MG/ML IJ SOLN: 5.0000 mg | Freq: Three times a day (TID) | INTRAMUSCULAR | Status: DC | PRN

## 2017-06-06 MED ORDER — CEFAZOLIN SODIUM-DEXTROSE 2-4 GM/100ML-% IV SOLN
2.0000 g | Freq: Three times a day (TID) | INTRAVENOUS | Status: DC
  Administered 2017-06-06 – 2017-07-15 (×115): 2 g via INTRAVENOUS
  Filled 2017-06-06 (×121): qty 100

## 2017-06-06 MED ORDER — GENTAMICIN SULFATE 40 MG/ML IJ SOLN
INTRAMUSCULAR | Status: AC
  Filled 2017-06-06: qty 2

## 2017-06-06 MED ORDER — HYDROMORPHONE HCL 1 MG/ML IJ SOLN
1.0000 mg | Freq: Once | INTRAMUSCULAR | Status: AC
  Administered 2017-06-06: 1 mg via INTRAVENOUS
  Filled 2017-06-06: qty 1

## 2017-06-06 MED ORDER — ACETAMINOPHEN 650 MG RE SUPP: 650.0000 mg | RECTAL | Status: DC | PRN

## 2017-06-06 MED ORDER — GENTAMICIN SULFATE 40 MG/ML IJ SOLN
INTRAMUSCULAR | Status: DC | PRN
  Administered 2017-06-06: 80 mg

## 2017-06-06 MED ORDER — PROPOFOL 10 MG/ML IV BOLUS
INTRAVENOUS | Status: DC | PRN
  Administered 2017-06-06: 30 mg via INTRAVENOUS
  Administered 2017-06-06: 200 mg via INTRAVENOUS

## 2017-06-06 MED ORDER — MIDAZOLAM HCL 5 MG/5ML IJ SOLN
INTRAMUSCULAR | Status: DC | PRN
  Administered 2017-06-06: 2 mg via INTRAVENOUS

## 2017-06-06 MED ORDER — HYDROMORPHONE HCL 1 MG/ML IJ SOLN
0.2500 mg | INTRAMUSCULAR | Status: DC | PRN
  Administered 2017-06-06 (×4): 0.5 mg via INTRAVENOUS

## 2017-06-06 MED ORDER — SODIUM CHLORIDE 0.9 % IR SOLN
Status: DC | PRN
  Administered 2017-06-06: 3000 mL

## 2017-06-06 MED ORDER — HYDROMORPHONE HCL 1 MG/ML IJ SOLN
INTRAMUSCULAR | Status: AC
  Administered 2017-06-06: 0.5 mg via INTRAVENOUS
  Filled 2017-06-06: qty 1

## 2017-06-06 MED ORDER — DEXMEDETOMIDINE HCL IN NACL 200 MCG/50ML IV SOLN
INTRAVENOUS | Status: DC | PRN
  Administered 2017-06-06: 12 ug via INTRAVENOUS
  Administered 2017-06-06: 8 ug via INTRAVENOUS

## 2017-06-06 MED ORDER — MIDAZOLAM HCL 2 MG/2ML IJ SOLN
INTRAMUSCULAR | Status: AC
  Filled 2017-06-06: qty 2

## 2017-06-06 MED ORDER — 0.9 % SODIUM CHLORIDE (POUR BTL) OPTIME
TOPICAL | Status: DC | PRN
  Administered 2017-06-06: 1000 mL

## 2017-06-06 MED ORDER — ROCURONIUM BROMIDE 100 MG/10ML IV SOLN
INTRAVENOUS | Status: DC | PRN
  Administered 2017-06-06: 50 mg via INTRAVENOUS

## 2017-06-06 MED ORDER — VANCOMYCIN HCL 500 MG IV SOLR
INTRAVENOUS | Status: DC | PRN
  Administered 2017-06-06: 500 mg

## 2017-06-06 MED ORDER — IOPAMIDOL (ISOVUE-M 200) INJECTION 41%: 20.0000 mL | Freq: Once | INTRAMUSCULAR | Status: DC

## 2017-06-06 MED ORDER — SUGAMMADEX SODIUM 200 MG/2ML IV SOLN
INTRAVENOUS | Status: DC | PRN
  Administered 2017-06-06: 140 mg via INTRAVENOUS

## 2017-06-06 MED ORDER — ROCURONIUM BROMIDE 10 MG/ML (PF) SYRINGE
PREFILLED_SYRINGE | INTRAVENOUS | Status: AC
  Filled 2017-06-06: qty 5

## 2017-06-06 MED ORDER — DIPHENHYDRAMINE HCL 25 MG PO CAPS
25.0000 mg | ORAL_CAPSULE | Freq: Four times a day (QID) | ORAL | Status: DC | PRN
  Administered 2017-06-06: 25 mg via ORAL
  Administered 2017-06-06 – 2017-06-07 (×2): 50 mg via ORAL
  Filled 2017-06-06: qty 1
  Filled 2017-06-06 (×2): qty 2

## 2017-06-06 MED ORDER — ONDANSETRON HCL 4 MG PO TABS: 4.0000 mg | ORAL_TABLET | Freq: Four times a day (QID) | ORAL | Status: DC | PRN

## 2017-06-06 MED ORDER — PHENYLEPHRINE 40 MCG/ML (10ML) SYRINGE FOR IV PUSH (FOR BLOOD PRESSURE SUPPORT)
PREFILLED_SYRINGE | INTRAVENOUS | Status: AC
  Filled 2017-06-06: qty 10

## 2017-06-06 MED ORDER — PROPOFOL 10 MG/ML IV BOLUS
INTRAVENOUS | Status: AC
  Filled 2017-06-06: qty 20

## 2017-06-06 MED ORDER — PROMETHAZINE HCL 25 MG/ML IJ SOLN: 6.2500 mg | INTRAMUSCULAR | Status: DC | PRN

## 2017-06-06 MED ORDER — ESMOLOL HCL 100 MG/10ML IV SOLN
INTRAVENOUS | Status: DC | PRN
  Administered 2017-06-06 (×3): 20 mg via INTRAVENOUS

## 2017-06-06 MED ORDER — LIDOCAINE HCL (PF) 1 % IJ SOLN
INTRAMUSCULAR | Status: AC
  Filled 2017-06-06: qty 5

## 2017-06-06 MED ORDER — ONDANSETRON HCL 4 MG/2ML IJ SOLN: 4.0000 mg | Freq: Four times a day (QID) | INTRAMUSCULAR | Status: DC | PRN

## 2017-06-06 MED ORDER — ONDANSETRON HCL 4 MG/2ML IJ SOLN
INTRAMUSCULAR | Status: DC | PRN
  Administered 2017-06-06: 4 mg via INTRAVENOUS

## 2017-06-06 MED ORDER — KETOROLAC TROMETHAMINE 30 MG/ML IJ SOLN
30.0000 mg | Freq: Once | INTRAMUSCULAR | Status: DC | PRN
  Administered 2017-06-06: 30 mg via INTRAVENOUS

## 2017-06-06 MED ORDER — IOPAMIDOL (ISOVUE-M 200) INJECTION 41%
INTRAMUSCULAR | Status: AC
  Filled 2017-06-06: qty 10

## 2017-06-06 MED ORDER — HYDROMORPHONE HCL 1 MG/ML IJ SOLN
0.5000 mg | INTRAMUSCULAR | Status: DC | PRN
  Administered 2017-06-06 – 2017-06-07 (×3): 0.5 mg via INTRAVENOUS
  Filled 2017-06-06 (×3): qty 1

## 2017-06-06 MED ORDER — METOCLOPRAMIDE HCL 5 MG PO TABS: 5.0000 mg | ORAL_TABLET | Freq: Three times a day (TID) | ORAL | Status: DC | PRN

## 2017-06-06 MED ORDER — FENTANYL CITRATE (PF) 100 MCG/2ML IJ SOLN
INTRAMUSCULAR | Status: DC | PRN
  Administered 2017-06-06: 150 ug via INTRAVENOUS
  Administered 2017-06-06: 50 ug via INTRAVENOUS
  Administered 2017-06-06 (×2): 100 ug via INTRAVENOUS
  Administered 2017-06-06 (×2): 50 ug via INTRAVENOUS

## 2017-06-06 MED ORDER — ACETAMINOPHEN 325 MG PO TABS: 650.0000 mg | ORAL_TABLET | ORAL | Status: DC | PRN

## 2017-06-06 MED ORDER — VANCOMYCIN HCL 500 MG IV SOLR
INTRAVENOUS | Status: AC
  Filled 2017-06-06: qty 500

## 2017-06-06 MED ORDER — GADOBENATE DIMEGLUMINE 529 MG/ML IV SOLN
15.0000 mL | Freq: Once | INTRAVENOUS | Status: AC | PRN
  Administered 2017-06-06: 15 mL via INTRAVENOUS

## 2017-06-06 MED ORDER — ONDANSETRON HCL 4 MG/2ML IJ SOLN
INTRAMUSCULAR | Status: AC
  Filled 2017-06-06: qty 2

## 2017-06-06 MED ORDER — LIDOCAINE HCL (PF) 1 % IJ SOLN
5.0000 mL | Freq: Once | INTRAMUSCULAR | Status: DC
  Filled 2017-06-06: qty 30

## 2017-06-06 MED ORDER — LACTATED RINGERS IV SOLN
INTRAVENOUS | Status: DC | PRN
  Administered 2017-06-06 (×2): via INTRAVENOUS

## 2017-06-06 MED ORDER — FENTANYL CITRATE (PF) 250 MCG/5ML IJ SOLN
INTRAMUSCULAR | Status: AC
Start: 2017-06-06 — End: ?
  Filled 2017-06-06: qty 5

## 2017-06-06 MED ORDER — SODIUM CHLORIDE 0.9 % IJ SOLN
INTRAMUSCULAR | Status: AC
  Filled 2017-06-06: qty 10

## 2017-06-06 SURGICAL SUPPLY — 63 items
BAG BILE T-TUBES STRL (MISCELLANEOUS) ×3 IMPLANT
BAG DECANTER FOR FLEXI CONT (MISCELLANEOUS) ×3 IMPLANT
BANDAGE ELASTIC 6 VELCRO ST LF (GAUZE/BANDAGES/DRESSINGS) ×3 IMPLANT
BLADE CLIPPER SURG (BLADE) ×3 IMPLANT
BLADE SAW SGTL 18X1.27X75 (BLADE) ×2 IMPLANT
BLADE SAW SGTL 18X1.27X75MM (BLADE) ×1
CELLS DAT CNTRL 66122 CELL SVR (MISCELLANEOUS) ×1 IMPLANT
CLOSURE STERI-STRIP 1/2X4 (GAUZE/BANDAGES/DRESSINGS) ×1
CLOSURE WOUND 1/2 X4 (GAUZE/BANDAGES/DRESSINGS) ×1
CLSR STERI-STRIP ANTIMIC 1/2X4 (GAUZE/BANDAGES/DRESSINGS) ×2 IMPLANT
COVER PERINEAL POST (MISCELLANEOUS) ×3 IMPLANT
COVER SURGICAL LIGHT HANDLE (MISCELLANEOUS) ×3 IMPLANT
DRAPE C-ARM 42X72 X-RAY (DRAPES) ×3 IMPLANT
DRAPE STERI IOBAN 125X83 (DRAPES) ×3 IMPLANT
DRAPE U-SHAPE 47X51 STRL (DRAPES) ×9 IMPLANT
DRSG AQUACEL AG ADV 3.5X10 (GAUZE/BANDAGES/DRESSINGS) ×3 IMPLANT
DRSG TEGADERM 4X4.75 (GAUZE/BANDAGES/DRESSINGS) ×12 IMPLANT
DURAPREP 26ML APPLICATOR (WOUND CARE) ×3 IMPLANT
ELECT BLADE 4.0 EZ CLEAN MEGAD (MISCELLANEOUS) ×3
ELECT REM PT RETURN 9FT ADLT (ELECTROSURGICAL) ×3
ELECTRODE BLDE 4.0 EZ CLN MEGD (MISCELLANEOUS) ×1 IMPLANT
ELECTRODE REM PT RTRN 9FT ADLT (ELECTROSURGICAL) ×1 IMPLANT
EVACUATOR 3/16  PVC DRAIN (DRAIN) ×2
EVACUATOR 3/16 PVC DRAIN (DRAIN) ×1 IMPLANT
GAUZE SPONGE 4X4 12PLY STRL (GAUZE/BANDAGES/DRESSINGS) ×3 IMPLANT
GAUZE XEROFORM 1X8 LF (GAUZE/BANDAGES/DRESSINGS) ×3 IMPLANT
GLOVE BIOGEL PI IND STRL 8 (GLOVE) ×1 IMPLANT
GLOVE BIOGEL PI INDICATOR 8 (GLOVE) ×2
GLOVE BIOGEL PI ORTHO PRO SZ8 (GLOVE) ×4
GLOVE PI ORTHO PRO STRL SZ8 (GLOVE) ×2 IMPLANT
GOWN STRL REUS W/ TWL LRG LVL3 (GOWN DISPOSABLE) ×3 IMPLANT
GOWN STRL REUS W/TWL LRG LVL3 (GOWN DISPOSABLE) ×6
HANDPIECE INTERPULSE COAX TIP (DISPOSABLE) ×2
HOOD PEEL AWAY FLYTE STAYCOOL (MISCELLANEOUS) ×9 IMPLANT
KIT BASIN OR (CUSTOM PROCEDURE TRAY) ×3 IMPLANT
KIT ROOM TURNOVER OR (KITS) ×3 IMPLANT
KIT STIMULAN RAPID CURE 5CC (Orthopedic Implant) ×3 IMPLANT
NEEDLE 18GX1X1/2 (RX/OR ONLY) (NEEDLE) ×3 IMPLANT
NEEDLE HYPO 22GX1.5 SAFETY (NEEDLE) ×3 IMPLANT
NS IRRIG 1000ML POUR BTL (IV SOLUTION) ×3 IMPLANT
PACK TOTAL JOINT (CUSTOM PROCEDURE TRAY) ×3 IMPLANT
PAD ARMBOARD 7.5X6 YLW CONV (MISCELLANEOUS) ×3 IMPLANT
RTRCTR WOUND ALEXIS 18CM MED (MISCELLANEOUS) ×3
SET HNDPC FAN SPRY TIP SCT (DISPOSABLE) ×1 IMPLANT
STRIP CLOSURE SKIN 1/2X4 (GAUZE/BANDAGES/DRESSINGS) ×2 IMPLANT
SUT ETHIBOND NAB CT1 #1 30IN (SUTURE) ×6 IMPLANT
SUT MNCRL AB 3-0 PS2 18 (SUTURE) ×6 IMPLANT
SUT MON AB 2-0 CT1 36 (SUTURE) ×6 IMPLANT
SUT PDS AB 0 CT 36 (SUTURE) ×3 IMPLANT
SUT PDS AB 1 CTX 36 (SUTURE) ×6 IMPLANT
SUT VIC AB 0 CT1 27 (SUTURE) ×2
SUT VIC AB 0 CT1 27XBRD ANBCTR (SUTURE) ×1 IMPLANT
SUT VIC AB 2-0 CT1 27 (SUTURE) ×4
SUT VIC AB 2-0 CT1 TAPERPNT 27 (SUTURE) ×2 IMPLANT
SWAB COLLECTION DEVICE MRSA (MISCELLANEOUS) ×3 IMPLANT
SWAB CULTURE LIQ STUART DBL (MISCELLANEOUS) ×3 IMPLANT
SYR 30ML LL (SYRINGE) ×3 IMPLANT
SYR 50ML LL SCALE MARK (SYRINGE) ×3 IMPLANT
SYR CONTROL 10ML LL (SYRINGE) ×3 IMPLANT
TOWEL OR 17X24 6PK STRL BLUE (TOWEL DISPOSABLE) ×3 IMPLANT
TOWEL OR 17X26 10 PK STRL BLUE (TOWEL DISPOSABLE) ×3 IMPLANT
TRAY FOLEY W/METER SILVER 16FR (SET/KITS/TRAYS/PACK) ×3 IMPLANT
WATER STERILE IRR 1000ML POUR (IV SOLUTION) ×3 IMPLANT

## 2017-06-06 NOTE — Progress Notes (Signed)
PHARMACY - PHYSICIAN COMMUNICATION CRITICAL VALUE ALERT - BLOOD CULTURE IDENTIFICATION (BCID)  Clifford BoschChristopher Tucker is an 37 y.o. male who [facility].  Assessment:  37 yo M presents on 1/14 with R neck pain, left hip pain, and SOB. Hx of IVDA. CXR did show some infiltrate but could also be septic emboli. 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 Started on azithro, Zosyn, and vancomycin to cover broadly.  Name of physician (or Provider) ContactedTish Tucker:  Clifford Tucker  Current antibiotics: Azithromycin, vancomycin, Zosyn  Changes to prescribed antibiotics recommended:  Stop azithromycin, vancomycin, Zosyn Start cefazolin 2g IV Q8h Recommendations accepted by provider  Results for orders placed or performed during the hospital encounter of 06/05/17  Blood Culture ID Panel (Reflexed) (Collected: 06/05/2017  2:47 PM)  Result Value Ref Range   Enterococcus species NOT DETECTED NOT DETECTED   Listeria monocytogenes NOT DETECTED NOT DETECTED   Staphylococcus species DETECTED (A) NOT DETECTED   Staphylococcus aureus DETECTED (A) NOT DETECTED   Methicillin resistance NOT DETECTED NOT DETECTED   Streptococcus species NOT DETECTED NOT DETECTED   Streptococcus agalactiae NOT DETECTED NOT DETECTED   Streptococcus pneumoniae NOT DETECTED NOT DETECTED   Streptococcus pyogenes NOT DETECTED NOT DETECTED   Acinetobacter baumannii NOT DETECTED NOT DETECTED   Enterobacteriaceae species NOT DETECTED NOT DETECTED   Enterobacter cloacae complex NOT DETECTED NOT DETECTED   Escherichia coli NOT DETECTED NOT DETECTED   Klebsiella oxytoca NOT DETECTED NOT DETECTED   Klebsiella pneumoniae NOT DETECTED NOT DETECTED   Proteus species NOT DETECTED NOT DETECTED   Serratia marcescens NOT DETECTED NOT DETECTED   Haemophilus influenzae NOT DETECTED NOT DETECTED   Neisseria meningitidis NOT DETECTED NOT DETECTED   Pseudomonas aeruginosa NOT DETECTED NOT DETECTED    Candida albicans NOT DETECTED NOT DETECTED   Candida glabrata NOT DETECTED NOT DETECTED   Candida krusei NOT DETECTED NOT DETECTED   Candida parapsilosis NOT DETECTED NOT DETECTED   Candida tropicalis NOT DETECTED NOT DETECTED    Clifford Tucker 06/06/2017  8:59 AM

## 2017-06-06 NOTE — ED Notes (Signed)
Admitting paged.  Weekly Room F06 blood cultures gram positive cocci in anaerobic bottle.  Jesse Sansjanee RN

## 2017-06-06 NOTE — Progress Notes (Signed)
Procedure(s) (LRB): DEBRIDEMENT RIGHT STERNOCLAVICULAR ABSCESS (Right) Subjective: Patient examined and CT scan of chest images personally reviewed 37 year old male with admitted history of IV drug abuse now with abscess at right sternoclavicular joint with extension into the neck and superior mediastinum. He has leukocytosis and history of previous fever  He states he had a abscess in the right antecubital fossa a few months ago which he opened and drained himself  Blood cultures are pending, echocardiogram is pending  The patient is nontoxic on IV vancomycin and Zosyn No history of previous thoracic trauma or thoracic surgery.  Of note the patient has been complaining of left hip pain which occurred around the onset of the right sternoclavicular infection  Objective: Vital signs in last 24 hours: Temp:  [97.7 F (36.5 C)-99.4 F (37.4 C)] 98.9 F (37.2 C) (01/15 0844) Pulse Rate:  [93-140] 106 (01/15 0844) Cardiac Rhythm: Sinus tachycardia (01/14 2145) Resp:  [9-29] 20 (01/15 0844) BP: (101-129)/(55-91) 123/78 (01/15 0844) SpO2:  [89 %-99 %] 99 % (01/15 0844) Weight:  [150 lb (68 kg)-154 lb 15.7 oz (70.3 kg)] 154 lb 15.7 oz (70.3 kg) (01/15 0844)  Hemodynamic parameters for last 24 hours:  stable afebrile  Intake/Output from previous day: 01/14 0701 - 01/15 0700 In: 1350 [IV Piggyback:1350] Out: -  Intake/Output this shift: No intake/output data recorded.       Exam    General- alert and comfortable    Neck- no JVD, no cervical adenopathy palpable, tenderness with erythema and induration over right sternoclavicular joint extending into the base of the right neck, no carotid bruit   Lungs- clear without rales, wheezes   Cor- regular rate and rhythm, no murmur , gallop   Abdomen- soft, non-tender   Extremities - warm, non-tender, minimal edema   Neuro- oriented, appropriate, no focal weakness. Tenderness over right supra-auricular joint   Lab Results: Recent Labs     06/05/17 1444 06/05/17 1457 06/06/17 0315  WBC 23.0*  --  19.7*  HGB 13.3 14.6 11.0*  HCT 39.4 43.0 33.1*  PLT 318  --  317   BMET:  Recent Labs    06/05/17 1444 06/05/17 1457 06/06/17 0315  NA 137 135 134*  K 3.7 3.6 4.0  CL 96* 95* 99*  CO2 27  --  24  GLUCOSE 109* 106* 82  BUN 9 6 7   CREATININE 0.66 0.70 0.58*  CALCIUM 9.0  --  8.1*    PT/INR: No results for input(s): LABPROT, INR in the last 72 hours. ABG    Component Value Date/Time   HCO3 20.2 03/13/2007 0120   TCO2 29 06/05/2017 1457   ACIDBASEDEF 4.0 (H) 03/13/2007 0120   CBG (last 3)  No results for input(s): GLUCAP in the last 72 hours.  Assessment/Plan: S/P Procedure(s) (LRB): DEBRIDEMENT RIGHT STERNOCLAVICULAR ABSCESS (Right) Patient scheduled for debridement and wound VAC placement on Wednesday, January 16. Procedure indications and risks were discussed with patient and family. Would recommend MRI of left hip prior to surgery.  LOS: 1 day    [name] 06/06/2017

## 2017-06-06 NOTE — Anesthesia Postprocedure Evaluation (Signed)
Anesthesia Post Note  Patient: Clifford Tucker  Procedure(s) Performed: IRRIGATION AND DEBRIDEMENT HIP (Left )     Patient location during evaluation: PACU Anesthesia Type: General Level of consciousness: awake and sedated Pain management: pain level controlled Vital Signs Assessment: post-procedure vital signs reviewed and stable Respiratory status: spontaneous breathing Cardiovascular status: stable Postop Assessment: no apparent nausea or vomiting Anesthetic complications: no    Last Vitals:  Vitals:   06/06/17 1753 06/06/17 1754  BP:    Pulse: 92 94  Resp: 10 11  Temp:    SpO2: 98% 99%    Last Pain:  Vitals:   06/06/17 1753  TempSrc:   PainSc: Asleep   Pain Goal:    LLE Motor Response: Purposeful movement;Responds to commands (06/06/17 1745) LLE Sensation: Full sensation (06/06/17 1745)          Kentaro Alewine JR,JOHN Susann GivensFRANKLIN

## 2017-06-06 NOTE — Consult Note (Signed)
Reason for Consult: Left hip pain Referring Physician: Dr Veryl Clifford Tucker is an 37 y.o. male.  HPI: Clifford Tucker is a 37 year old patient admitted for multiple septic joints.  He has a right neck infection involving the sternoclavicular joint which will require surgery tomorrow.  He has had left hip pain and difficulty with ambulation for 2 weeks.  He reports some fevers.  Denies any trauma to the left hip.  MRI scan confirms intra-articular fluid collection consistent with septic arthritis.  There is a fluid collection which also extends up into the iliac S muscle.  Past Medical History:  Diagnosis Date  . Pneumonia     Past Surgical History:  Procedure Laterality Date  . SHOULDER SURGERY      Family History  Problem Relation Age of Onset  . Drug abuse Mother     Social History:  reports that he has been smoking.  He has been smoking about 1.00 pack per day. he has never used smokeless tobacco. He reports that he uses drugs. Drugs: Marijuana and Heroin. He reports that he does not drink alcohol.  Allergies: No Known Allergies  Medications: I have reviewed the patient's current medications.  Results for orders placed or performed during the hospital encounter of 06/05/17 (from the past 48 hour(s))  Comprehensive metabolic panel     Status: Abnormal   Collection Time: 06/05/17  2:44 PM  Result Value Ref Range   Sodium 137 135 - 145 mmol/L   Potassium 3.7 3.5 - 5.1 mmol/L   Chloride 96 (L) 101 - 111 mmol/L   CO2 27 22 - 32 mmol/L   Glucose, Bld 109 (H) 65 - 99 mg/dL   BUN 9 6 - 20 mg/dL   Creatinine, Ser 0.66 0.61 - 1.24 mg/dL   Calcium 9.0 8.9 - 10.3 mg/dL   Total Protein 7.5 6.5 - 8.1 g/dL   Albumin 2.9 (L) 3.5 - 5.0 g/dL   AST 67 (H) 15 - 41 U/L   ALT 63 17 - 63 U/L   Alkaline Phosphatase 201 (H) 38 - 126 U/L   Total Bilirubin 0.5 0.3 - 1.2 mg/dL   GFR calc non Af Amer >60 >60 mL/min   GFR calc Af Amer >60 >60 mL/min    Comment: (NOTE) The eGFR has been  calculated using the CKD EPI equation. This calculation has not been validated in all clinical situations. eGFR's persistently <60 mL/min signify possible Chronic Kidney Disease.    Anion gap 14 5 - 15  CBC with Differential     Status: Abnormal   Collection Time: 06/05/17  2:44 PM  Result Value Ref Range   WBC 23.0 (H) 4.0 - 10.5 K/uL   RBC 4.35 4.22 - 5.81 MIL/uL   Hemoglobin 13.3 13.0 - 17.0 g/dL   HCT 39.4 39.0 - 52.0 %   MCV 90.6 78.0 - 100.0 fL   MCH 30.6 26.0 - 34.0 pg   MCHC 33.8 30.0 - 36.0 g/dL   RDW 14.3 11.5 - 15.5 %   Platelets 318 150 - 400 K/uL   Neutrophils Relative % 82 %   Lymphocytes Relative 6 %   Monocytes Relative 10 %   Eosinophils Relative 2 %   Basophils Relative 0 %   Neutro Abs 18.8 (H) 1.7 - 7.7 K/uL   Lymphs Abs 1.4 0.7 - 4.0 K/uL   Monocytes Absolute 2.3 (H) 0.1 - 1.0 K/uL   Eosinophils Absolute 0.5 0.0 - 0.7 K/uL   Basophils Absolute 0.0 0.0 -  0.1 K/uL   WBC Morphology MILD LEFT SHIFT (1-5% METAS, OCC MYELO, OCC BANDS)     Comment: VACUOLATED NEUTROPHILS  Blood culture (routine x 2)     Status: None (Preliminary result)   Collection Time: 06/05/17  2:44 PM  Result Value Ref Range   Specimen Description RIGHT ANTECUBITAL    Special Requests      BOTTLES DRAWN AEROBIC ONLY Blood Culture adequate volume   Culture  Setup Time      GRAM POSITIVE COCCI Gram Stain Report Called to,Read Back By and Verified With: ROWE,C AT 0730 BY HUFFINES,S ON 06/06/17. GRAM STAIN REVIEWED-AGREE WITH RESULT JW/DV Performed at [facility], [place] [address] [place], [place] [address]    Culture PENDING    Report Status PENDING   Blood culture (routine x 2)     Status: None (Preliminary result)   Collection Time: 06/05/17  2:47 PM  Result Value Ref Range   Specimen Description LEFT ANTECUBITAL    Special Requests      BOTTLES DRAWN AEROBIC AND ANAEROBIC Blood Culture adequate volume   Culture  Setup Time      GRAM POSITIVE COCCI BOTH AEB AND ANA Gram  Stain Report Called to,Read Back By and Verified With: J.CRUISE, RN @ [facility] ED @ 0545 ON 1.15.19 BY BOWMAN,L Organism ID to follow CRITICAL RESULT CALLED TO, READ BACK BY AND VERIFIED WITH: N. Batchelder Pharm.D. 8:55 06/06/17 ([email or web address])    Culture      NO GROWTH < 24 HOURS Performed at [facility], [place] [address], [place], [place] [address]    Report Status PENDING   Blood Culture ID Panel (Reflexed)     Status: Abnormal   Collection Time: 06/05/17  2:47 PM  Result Value Ref Range   Enterococcus species NOT DETECTED NOT DETECTED   Listeria monocytogenes NOT DETECTED NOT DETECTED   Staphylococcus species DETECTED (A) NOT DETECTED    Comment: CRITICAL RESULT CALLED TO, READ BACK BY AND VERIFIED WITH: N. Batchelder Pharm.D. 8:55 06/06/17 ([email or web address])    Staphylococcus aureus DETECTED (A) NOT DETECTED    Comment: Methicillin (oxacillin) susceptible Staphylococcus aureus (MSSA). Preferred therapy is anti staphylococcal beta lactam antibiotic (Cefazolin or Nafcillin), unless clinically contraindicated. CRITICAL RESULT CALLED TO, READ BACK BY AND VERIFIED WITH: N. Batchelder Pharm.D. 8:55 06/06/17 (wilsonm)    Methicillin resistance NOT DETECTED NOT DETECTED   Streptococcus species NOT DETECTED NOT DETECTED   Streptococcus agalactiae NOT DETECTED NOT DETECTED   Streptococcus pneumoniae NOT DETECTED NOT DETECTED   Streptococcus pyogenes NOT DETECTED NOT DETECTED   Acinetobacter baumannii NOT DETECTED NOT DETECTED   Enterobacteriaceae species NOT DETECTED NOT DETECTED   Enterobacter cloacae complex NOT DETECTED NOT DETECTED   Escherichia coli NOT DETECTED NOT DETECTED   Klebsiella oxytoca NOT DETECTED NOT DETECTED   Klebsiella pneumoniae NOT DETECTED NOT DETECTED   Proteus species NOT DETECTED NOT DETECTED   Serratia marcescens NOT DETECTED NOT DETECTED   Haemophilus influenzae NOT DETECTED NOT DETECTED   Neisseria meningitidis NOT DETECTED NOT DETECTED   Pseudomonas aeruginosa NOT  DETECTED NOT DETECTED   Candida albicans NOT DETECTED NOT DETECTED   Candida glabrata NOT DETECTED NOT DETECTED   Candida krusei NOT DETECTED NOT DETECTED   Candida parapsilosis NOT DETECTED NOT DETECTED   Candida tropicalis NOT DETECTED NOT DETECTED  I-Stat Chem 8, ED     Status: Abnormal   Collection Time: 06/05/17  2:57 PM  Result Value Ref Range   Sodium 135 135 -  145 mmol/L   Potassium 3.6 3.5 - 5.1 mmol/L   Chloride 95 (L) 101 - 111 mmol/L   BUN 6 6 - 20 mg/dL   Creatinine, Ser 0.70 0.61 - 1.24 mg/dL   Glucose, Bld 106 (H) 65 - 99 mg/dL   Calcium, Ion 1.05 (L) 1.15 - 1.40 mmol/L   TCO2 29 22 - 32 mmol/L   Hemoglobin 14.6 13.0 - 17.0 g/dL   HCT 43.0 39.0 - 52.0 %  I-Stat CG4 Lactic Acid, ED     Status: None   Collection Time: 06/05/17  4:41 PM  Result Value Ref Range   Lactic Acid, Venous 0.82 0.5 - 1.9 mmol/L  HIV antibody (Routine Testing)     Status: None   Collection Time: 06/06/17  3:15 AM  Result Value Ref Range   HIV Screen 4th Generation wRfx Non Reactive Non Reactive    Comment: (NOTE) Performed At: [facility] [place], [place] [id number] Rush Farmer MD WU:[id number]   CBC WITH DIFFERENTIAL     Status: Abnormal   Collection Time: 06/06/17  3:15 AM  Result Value Ref Range   WBC 19.7 (H) 4.0 - 10.5 K/uL   RBC 3.67 (L) 4.22 - 5.81 MIL/uL   Hemoglobin 11.0 (L) 13.0 - 17.0 g/dL   HCT 33.1 (L) 39.0 - 52.0 %   MCV 90.2 78.0 - 100.0 fL   MCH 30.0 26.0 - 34.0 pg   MCHC 33.2 30.0 - 36.0 g/dL   RDW 14.8 11.5 - 15.5 %   Platelets 317 150 - 400 K/uL   Neutrophils Relative % 77 %   Neutro Abs 15.1 (H) 1.7 - 7.7 K/uL   Lymphocytes Relative 8 %   Lymphs Abs 1.6 0.7 - 4.0 K/uL   Monocytes Relative 13 %   Monocytes Absolute 2.6 (H) 0.1 - 1.0 K/uL   Eosinophils Relative 2 %   Eosinophils Absolute 0.3 0.0 - 0.7 K/uL   Basophils Relative 0 %   Basophils Absolute 0.0 0.0 - 0.1 K/uL  Comprehensive metabolic panel     Status: Abnormal    Collection Time: 06/06/17  3:15 AM  Result Value Ref Range   Sodium 134 (L) 135 - 145 mmol/L   Potassium 4.0 3.5 - 5.1 mmol/L   Chloride 99 (L) 101 - 111 mmol/L   CO2 24 22 - 32 mmol/L   Glucose, Bld 82 65 - 99 mg/dL   BUN 7 6 - 20 mg/dL   Creatinine, Ser 0.58 (L) 0.61 - 1.24 mg/dL   Calcium 8.1 (L) 8.9 - 10.3 mg/dL   Total Protein 6.4 (L) 6.5 - 8.1 g/dL   Albumin 2.3 (L) 3.5 - 5.0 g/dL   AST 43 (H) 15 - 41 U/L   ALT 47 17 - 63 U/L   Alkaline Phosphatase 176 (H) 38 - 126 U/L   Total Bilirubin 0.3 0.3 - 1.2 mg/dL   GFR calc non Af Amer >60 >60 mL/min   GFR calc Af Amer >60 >60 mL/min    Comment: (NOTE) The eGFR has been calculated using the CKD EPI equation. This calculation has not been validated in all clinical situations. eGFR's persistently <60 mL/min signify possible Chronic Kidney Disease.    Anion gap 11 5 - 15    Dg Chest 2 View  Result Date: 06/05/2017 CLINICAL DATA:  Shortness of Breath EXAM: CHEST  2 VIEW COMPARISON:  December 01, 2011 FINDINGS: There is patchy infiltrate in the lateral right base. Lungs elsewhere are clear.  Heart size and pulmonary vascularity are normal. No adenopathy. No bone lesions. IMPRESSION: Infiltrate consistent with pneumonia lateral right base. Lungs elsewhere clear. Cardiac silhouette within normal limits. Electronically Signed   By: Lowella Grip III M.D.   On: 06/05/2017 14:47   Ct Soft Tissue Neck W Contrast  Result Date: 06/05/2017 CLINICAL DATA:  Neck mass.  Hard, red, raised area in the neck. EXAM: CT NECK WITH CONTRAST TECHNIQUE: Multidetector CT imaging of the neck was performed using the standard protocol following the bolus administration of intravenous contrast. CONTRAST:  115m ISOVUE-300 IOPAMIDOL (ISOVUE-300) INJECTION 61% COMPARISON:  None. FINDINGS: Pharynx and larynx: No pharyngeal or laryngeal mass. Patent airway. No retropharyngeal fluid collection. Salivary glands: No inflammation, mass, or stone. Thyroid: Mass effect on the  right thyroid lobe by the inflammatory process described below. No intrinsic thyroid abnormality identified. Lymph nodes: Subcentimeter but asymmetric anterior and posterior cervical lymph nodes throughout the right neck, likely reactive. Vascular: Major vascular structures of the neck are patent. The below described inflammatory process extends into the right carotid space in the lower neck. Limited intracranial: Unremarkable. Visualized orbits: Not imaged. Mastoids and visualized paranasal sinuses: Partially visualized polypoid mucosal thickening or small mucous retention cyst in the left maxillary sinus. Visualized mastoid air cells are clear. Skeleton: There is a heterogeneous gas and fluid collection along the medial aspect of the right sternocleidomastoid muscle in the lower neck beginning near the clavicular head. This collection extends posteriorly and inferiorly into the anterior mediastinum, more fully evaluated on the separate chest CT. Phlegmon extends superiorly within/along the right sternocleidomastoid muscle in the lower and mid neck. Fluid extends superiorly in the right neck deep to the strap muscles and contains a few locules of gas, however this fluid does not appear as organized as the collections in the lower neck and upper chest. Inflammation in the right neck extends just superior to the level of the thyroid cartilage. No erosive changes are seen at the right sternoclavicular joint. Upper chest: Reported separately. Other: None. IMPRESSION: Gas and fluid collections in the anterior upper chest as reported on separate chest CT, concerning for abscesses. Phlegmon and less organized gas and fluid extend superiorly in the anterior right neck with suspected myositis of the right sternocleidomastoid muscle. Electronically Signed   By: ALogan BoresM.D.   On: 06/05/2017 16:31   Ct Chest W Contrast  Result Date: 06/05/2017 CLINICAL DATA:  Patient with shortness of breath. Red raised area anterior  lower neck. EXAM: CT CHEST WITH CONTRAST TECHNIQUE: Multidetector CT imaging of the chest was performed during intravenous contrast administration. CONTRAST:  1527mISOVUE-300 IOPAMIDOL (ISOVUE-300) INJECTION 61% COMPARISON:  Chest CT 06/21/2005; neck CT same day. FINDINGS: Cardiovascular: Normal heart size. No pericardial effusion. Normal caliber thoracic aorta. Enlarged main pulmonary artery (3.3 cm) as can be seen with pulmonary arterial hypertension. Mediastinum/Nodes: 1.2 cm AP window lymph node (image 50; series 2). 7 mm right paratracheal lymph node (image 50; series 2). Normal appearance of the esophagus. No axillary adenopathy. Lungs/Pleura: Central airways are patent. Subpleural consolidation with adjacent tree-in-bud ground-glass nodularity within the right lower lobe (image 114; series 4). Within the right upper lobe there multiple areas of regional ground-glass attenuation. No pleural effusion or pneumothorax. Upper Abdomen: No acute process. Musculoskeletal: There is a large centrally necrotic peripherally enhancing mass within the lower neck which extends from the anterior aspect of the right sternal clavicular joint/distal right sternocleidomastoid superiorly along the right aspect of the thyroid and inferiorly within the  retrosternal location. The largest portion of this mass measures 4.7 x 4.1 cm. There are multiple small foci of gas within the mass. No definite osseous destruction demonstrated within the right sternoclavicular joint. IMPRESSION: 1. There is a large peripherally enhancing mass with central fluid and gas within the right lower neck extending from the anterior aspect of the right sternoclavicular location posteriorly along the retrosternal soft tissues. Findings are concerning for abscess. No definite osseous destruction at this time of the right sternoclavicular joint or adjacent costochondral joint however secondary infection of these joints is not excluded given the size of the  suspected abscess. 2. Consolidation within the right lower lobe with right upper lobe ground-glass opacities favored to represent multifocal pneumonia. 3. Mediastinal and right hilar adenopathy likely reactive in etiology. Critical Value/emergent results were called by telephone at the time of interpretation on 06/05/2017 at 4:18 pm to Dr. Jeneen Rinks, who verbally acknowledged these results. Electronically Signed   By: Lovey Newcomer M.D.   On: 06/05/2017 16:29   Mr Hip Left W Wo Contrast  Result Date: 06/06/2017 CLINICAL DATA:  Severe left hip pain for the past few weeks. History of IV drug abuse and recurrent skin and soft tissue infections. EXAM: MRI OF THE LEFT HIP WITHOUT AND WITH CONTRAST TECHNIQUE: Multiplanar, multisequence MR imaging was performed both before and after administration of intravenous contrast. CONTRAST:  47m MULTIHANCE GADOBENATE DIMEGLUMINE 529 MG/ML IV SOLN COMPARISON:  None. FINDINGS: Bones: There is no evidence of acute fracture, dislocation or avascular necrosis. The visualized bony pelvis appears normal. The visualized sacroiliac joints and symphysis pubis appear normal. Articular cartilage and labrum Articular cartilage: No focal chondral defect or subchondral signal abnormality identified. Labrum: There is a tear of the left anterior superior labrum. Joint or bursal effusion Joint effusion: Small left hip joint effusion with prominent synovial enhancement. No right hip joint effusion. Bursae: Prominent, rim enhancing fluid within the left iliopsoas bursa, extending superiorly into the left iliacus muscle. The bursa measures up to 2.5 x 3.3 cm in maximal AP by TR dimension. No greater trochanteric bursal fluid. Muscles and tendons Muscles and tendons: Prominent muscle edema and enhancement of the left iliacus muscle. Mild myofascial edema along the left vastus and adductor compartments. The visualized gluteus, hamstring and iliopsoas tendons appear normal. The piriformis muscles appear  symmetric. Other findings Miscellaneous: Trace presacral edema. The visualized internal pelvic contents otherwise appear unremarkable. IMPRESSION: 1. Findings consistent with left hip septic arthritis and left iliopsoas septic bursitis. The left iliopsoas bursa extends into the left iliacus muscle, which demonstrates prominent muscle edema and enhancement, consistent with infectious myositis. The preliminary findings were called by telephone at the time of interpretation on 06/06/2017 at 4:00 am to Dr. TChristia ReadingOPYD, by Dr. AAnner Crete Electronically Signed   By: WTitus DubinM.D.   On: 06/06/2017 08:15    Review of Systems  Constitutional: Positive for fever.  Musculoskeletal: Positive for joint pain.   Blood pressure 123/78, pulse (!) 106, temperature 98.9 F (37.2 C), temperature source Oral, resp. rate 20, height '5\' 10"'  (1.778 m), weight 154 lb 15.7 oz (70.3 kg), SpO2 99 %. Physical Exam  Constitutional: He appears well-developed.  HENT:  Head: Normocephalic.  Eyes: Pupils are equal, round, and reactive to light.  Cardiovascular: Normal rate.  Respiratory: Effort normal. No stridor.  Neurological: He is alert.  Skin: Skin is warm.  Psychiatric: He has a normal mood and affect.  Examination of the left hip demonstrates pain with range of  motion but no areas of induration or fluctuance.  Ankle dorsiflexion plantarflexion intact on the left.  No knee effusion on the left.  Impression is  Assessment/Plan: Septic left hip joint in an IV drug abuser.  He has had symptoms for several weeks.  MRI scan confirms fluid within the joint.  This also tracks up into the iliac is muscle inside the pelvis.  This will be addressed by interventional radiology.  Plan for today is hip joint incision and debridement with irrigation and washout.  Risk and benefits are discussed including but not limited to infection incomplete eradication, avascular necrosis, persistent hip pain, as well as need for more  surgery.  All questions answered at  [employer] 06/06/2017, 3:22 PM

## 2017-06-06 NOTE — Progress Notes (Addendum)
0840 Received pt from ED, A&O x4. Redness and pain noted to right clavicle area. No open wound.  1335 Pt to short stay for I&D of left hip joint. NPO post mn maint.   1825 received pt back from PACU. A&O x4, left hip dressing dry and intact, with hemovac in place and compressed.

## 2017-06-06 NOTE — Progress Notes (Addendum)
Pt refused to get MRI done tonight. Requested MRI to be done tomorrow.Pt states he just wants to sleep, he has not slept for days. MRI staff aware and will do MRI in am.

## 2017-06-06 NOTE — Anesthesia Procedure Notes (Signed)
Procedure Name: Intubation Date/Time: 06/06/2017 3:40 PM Performed by: Rosiland OzMeyers, Jerald Villalona, CRNA Pre-anesthesia Checklist: Patient identified, Emergency Drugs available, Suction available, Patient being monitored and Timeout performed Patient Re-evaluated:Patient Re-evaluated prior to induction Oxygen Delivery Method: Circle system utilized Preoxygenation: Pre-oxygenation with 100% oxygen Induction Type: IV induction Ventilation: Mask ventilation without difficulty Laryngoscope Size: Glidescope and 4 Grade View: Grade I Tube type: Oral Tube size: 7.5 mm Number of attempts: 1 Airway Equipment and Method: Video-laryngoscopy Placement Confirmation: positive ETCO2,  ETT inserted through vocal cords under direct vision and breath sounds checked- equal and bilateral Secured at: 22 cm Tube secured with: Tape Dental Injury: Teeth and Oropharynx as per pre-operative assessment

## 2017-06-06 NOTE — ED Notes (Signed)
Patient transported to  mri 

## 2017-06-06 NOTE — ED Notes (Signed)
Admitting paged,  Iline OvenBoone F06, patient is requesting medication for sleep.  Jesse SansJanee RN

## 2017-06-06 NOTE — Consult Note (Signed)
Reason for Consult:Left hip pain Referring Physician: [name] is an 37 y.o. 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 HPI: Clifford Tucker was admitted with a neck abscess and PNA. He notes that he has been suffering with left hip pain for a few weeks. This has waxed and waned but has been fairly severe at times. He sought care about 10d ago as an outpatient. He had some labwork done and was given crutches and some pain medicine and told to f/u if it didn't get better. It actually did for a few days but then got worse again. He notes that he cannot walk on it. He denies prior e/o similar.  Past Medical History:  Diagnosis Date  . Pneumonia     Past Surgical History:  Procedure Laterality Date  . SHOULDER SURGERY      Family History  Problem Relation Age of Onset  . Drug abuse Mother     Social History:  reports that he has been smoking.  He has been smoking about 1.00 pack per day. he has never used smokeless tobacco. He reports that he uses drugs. Drug: Marijuana. He reports that he does not drink alcohol.  Allergies: No Known Allergies  Medications: I have reviewed the patient's current medications.  Results for orders placed or performed during the hospital encounter of 06/05/17 (from the past 48 hour(s))  Comprehensive metabolic panel     Status: Abnormal   Collection Time: 06/05/17  2:44 PM  Result Value Ref Range   Sodium 137 135 - 145 mmol/L   Potassium 3.7 3.5 - 5.1 mmol/L   Chloride 96 (L) 101 - 111 mmol/L   CO2 27 22 - 32 mmol/L   Glucose, Bld 109 (H) 65 - 99 mg/dL   BUN 9 6 - 20 mg/dL   Creatinine, Ser 0.66 0.61 - 1.24 mg/dL   Calcium 9.0 8.9 - 10.3 mg/dL   Total Protein 7.5 6.5 - 8.1 g/dL   Albumin 2.9 (L) 3.5 - 5.0 g/dL   AST 67 (H) 15 - 41 U/L   ALT 63 17 - 63 U/L   Alkaline Phosphatase 201 (H) 38 - 126 U/L   Total Bilirubin 0.5 0.3 - 1.2 mg/dL   GFR calc non Af Amer >60 >60 mL/min   GFR calc Af Amer >60 >60 mL/min    Comment: (NOTE) The eGFR has been calculated  using the CKD EPI equation. This calculation has not been validated in all clinical situations. eGFR's persistently <60 mL/min signify possible Chronic Kidney Disease.    Anion gap 14 5 - 15  CBC with Differential     Status: Abnormal   Collection Time: 06/05/17  2:44 PM  Result Value Ref Range   WBC 23.0 (H) 4.0 - 10.5 K/uL   RBC 4.35 4.22 - 5.81 MIL/uL   Hemoglobin 13.3 13.0 - 17.0 g/dL   HCT 39.4 39.0 - 52.0 %   MCV 90.6 78.0 - 100.0 fL   MCH 30.6 26.0 - 34.0 pg   MCHC 33.8 30.0 - 36.0 g/dL   RDW 14.3 11.5 - 15.5 %   Platelets 318 150 - 400 K/uL   Neutrophils Relative % 82 %   Lymphocytes Relative 6 %   Monocytes Relative 10 %   Eosinophils Relative 2 %   Basophils Relative 0 %   Neutro Abs 18.8 (H) 1.7 - 7.7 K/uL   Lymphs Abs 1.4 0.7 - 4.0 K/uL   Monocytes Absolute 2.3 (H) 0.1 - 1.0 K/uL   Eosinophils  Absolute 0.5 0.0 - 0.7 K/uL   Basophils Absolute 0.0 0.0 - 0.1 K/uL   WBC Morphology MILD LEFT SHIFT (1-5% METAS, OCC MYELO, OCC BANDS)     Comment: VACUOLATED NEUTROPHILS  Blood culture (routine x 2)     Status: None (Preliminary result)   Collection Time: 06/05/17  2:44 PM  Result Value Ref Range   Specimen Description RIGHT ANTECUBITAL    Special Requests      BOTTLES DRAWN AEROBIC ONLY Blood Culture adequate volume   Culture  Setup Time      GRAM POSITIVE COCCI Gram Stain Report Called to,Read Back By and Verified With: ROWE,C AT 0730 BY HUFFINES,S ON 06/06/17.    Culture PENDING    Report Status PENDING   Blood culture (routine x 2)     Status: None (Preliminary result)   Collection Time: 06/05/17  2:47 PM  Result Value Ref Range   Specimen Description LEFT ANTECUBITAL    Special Requests      BOTTLES DRAWN AEROBIC AND ANAEROBIC Blood Culture adequate volume   Culture  Setup Time      GRAM POSITIVE COCCI BOTH AEB AND ANA Gram Stain Report Called to,Read Back By and Verified With: J.CRUISE, RN @ [facility] ED @ 0545 ON 1.15.19 BY BOWMAN,L Organism ID to  follow CRITICAL RESULT CALLED TO, READ BACK BY AND VERIFIED WITH: N. Batchelder Pharm.D. 8:55 06/06/17 ([email or web address])    Culture PENDING    Report Status PENDING   Blood Culture ID Panel (Reflexed)     Status: Abnormal   Collection Time: 06/05/17  2:47 PM  Result Value Ref Range   Enterococcus species NOT DETECTED NOT DETECTED   Listeria monocytogenes NOT DETECTED NOT DETECTED   Staphylococcus species DETECTED (A) NOT DETECTED    Comment: CRITICAL RESULT CALLED TO, READ BACK BY AND VERIFIED WITH: N. Batchelder Pharm.D. 8:55 06/06/17 (wilsonm)    Staphylococcus aureus DETECTED (A) NOT DETECTED    Comment: Methicillin (oxacillin) susceptible Staphylococcus aureus (MSSA). Preferred therapy is anti staphylococcal beta lactam antibiotic (Cefazolin or Nafcillin), unless clinically contraindicated. CRITICAL RESULT CALLED TO, READ BACK BY AND VERIFIED WITH: N. Batchelder Pharm.D. 8:55 06/06/17 (wilsonm)    Methicillin resistance NOT DETECTED NOT DETECTED   Streptococcus species NOT DETECTED NOT DETECTED   Streptococcus agalactiae NOT DETECTED NOT DETECTED   Streptococcus pneumoniae NOT DETECTED NOT DETECTED   Streptococcus pyogenes NOT DETECTED NOT DETECTED   Acinetobacter baumannii NOT DETECTED NOT DETECTED   Enterobacteriaceae species NOT DETECTED NOT DETECTED   Enterobacter cloacae complex NOT DETECTED NOT DETECTED   Escherichia coli NOT DETECTED NOT DETECTED   Klebsiella oxytoca NOT DETECTED NOT DETECTED   Klebsiella pneumoniae NOT DETECTED NOT DETECTED   Proteus species NOT DETECTED NOT DETECTED   Serratia marcescens NOT DETECTED NOT DETECTED   Haemophilus influenzae NOT DETECTED NOT DETECTED   Neisseria meningitidis NOT DETECTED NOT DETECTED   Pseudomonas aeruginosa NOT DETECTED NOT DETECTED   Candida albicans NOT DETECTED NOT DETECTED   Candida glabrata NOT DETECTED NOT DETECTED   Candida krusei NOT DETECTED NOT DETECTED   Candida parapsilosis NOT DETECTED NOT DETECTED   Candida  tropicalis NOT DETECTED NOT DETECTED  I-Stat Chem 8, ED     Status: Abnormal   Collection Time: 06/05/17  2:57 PM  Result Value Ref Range   Sodium 135 135 - 145 mmol/L   Potassium 3.6 3.5 - 5.1 mmol/L   Chloride 95 (L) 101 - 111 mmol/L   BUN 6 6 - 20 mg/dL  Creatinine, Ser 0.70 0.61 - 1.24 mg/dL   Glucose, Bld 106 (H) 65 - 99 mg/dL   Calcium, Ion 1.05 (L) 1.15 - 1.40 mmol/L   TCO2 29 22 - 32 mmol/L   Hemoglobin 14.6 13.0 - 17.0 g/dL   HCT 43.0 39.0 - 52.0 %  I-Stat CG4 Lactic Acid, ED     Status: None   Collection Time: 06/05/17  4:41 PM  Result Value Ref Range   Lactic Acid, Venous 0.82 0.5 - 1.9 mmol/L  CBC WITH DIFFERENTIAL     Status: Abnormal   Collection Time: 06/06/17  3:15 AM  Result Value Ref Range   WBC 19.7 (H) 4.0 - 10.5 K/uL   RBC 3.67 (L) 4.22 - 5.81 MIL/uL   Hemoglobin 11.0 (L) 13.0 - 17.0 g/dL   HCT 33.1 (L) 39.0 - 52.0 %   MCV 90.2 78.0 - 100.0 fL   MCH 30.0 26.0 - 34.0 pg   MCHC 33.2 30.0 - 36.0 g/dL   RDW 14.8 11.5 - 15.5 %   Platelets 317 150 - 400 K/uL   Neutrophils Relative % 77 %   Neutro Abs 15.1 (H) 1.7 - 7.7 K/uL   Lymphocytes Relative 8 %   Lymphs Abs 1.6 0.7 - 4.0 K/uL   Monocytes Relative 13 %   Monocytes Absolute 2.6 (H) 0.1 - 1.0 K/uL   Eosinophils Relative 2 %   Eosinophils Absolute 0.3 0.0 - 0.7 K/uL   Basophils Relative 0 %   Basophils Absolute 0.0 0.0 - 0.1 K/uL  Comprehensive metabolic panel     Status: Abnormal   Collection Time: 06/06/17  3:15 AM  Result Value Ref Range   Sodium 134 (L) 135 - 145 mmol/L   Potassium 4.0 3.5 - 5.1 mmol/L   Chloride 99 (L) 101 - 111 mmol/L   CO2 24 22 - 32 mmol/L   Glucose, Bld 82 65 - 99 mg/dL   BUN 7 6 - 20 mg/dL   Creatinine, Ser 0.58 (L) 0.61 - 1.24 mg/dL   Calcium 8.1 (L) 8.9 - 10.3 mg/dL   Total Protein 6.4 (L) 6.5 - 8.1 g/dL   Albumin 2.3 (L) 3.5 - 5.0 g/dL   AST 43 (H) 15 - 41 U/L   ALT 47 17 - 63 U/L   Alkaline Phosphatase 176 (H) 38 - 126 U/L   Total Bilirubin 0.3 0.3 - 1.2 mg/dL    GFR calc non Af Amer >60 >60 mL/min   GFR calc Af Amer >60 >60 mL/min    Comment: (NOTE) The eGFR has been calculated using the CKD EPI equation. This calculation has not been validated in all clinical situations. eGFR's persistently <60 mL/min signify possible Chronic Kidney Disease.    Anion gap 11 5 - 15    Dg Chest 2 View  Result Date: 06/05/2017 CLINICAL DATA:  Shortness of Breath EXAM: CHEST  2 VIEW COMPARISON:  December 01, 2011 FINDINGS: There is patchy infiltrate in the lateral right base. Lungs elsewhere are clear. Heart size and pulmonary vascularity are normal. No adenopathy. No bone lesions. IMPRESSION: Infiltrate consistent with pneumonia lateral right base. Lungs elsewhere clear. Cardiac silhouette within normal limits. Electronically Signed   By: Lowella Grip III M.D.   On: 06/05/2017 14:47   Ct Soft Tissue Neck W Contrast  Result Date: 06/05/2017 CLINICAL DATA:  Neck mass.  Hard, red, raised area in the neck. EXAM: CT NECK WITH CONTRAST TECHNIQUE: Multidetector CT imaging of the neck was performed using the standard  protocol following the bolus administration of intravenous contrast. CONTRAST:  134m ISOVUE-300 IOPAMIDOL (ISOVUE-300) INJECTION 61% COMPARISON:  None. FINDINGS: Pharynx and larynx: No pharyngeal or laryngeal mass. Patent airway. No retropharyngeal fluid collection. Salivary glands: No inflammation, mass, or stone. Thyroid: Mass effect on the right thyroid lobe by the inflammatory process described below. No intrinsic thyroid abnormality identified. Lymph nodes: Subcentimeter but asymmetric anterior and posterior cervical lymph nodes throughout the right neck, likely reactive. Vascular: Major vascular structures of the neck are patent. The below described inflammatory process extends into the right carotid space in the lower neck. Limited intracranial: Unremarkable. Visualized orbits: Not imaged. Mastoids and visualized paranasal sinuses: Partially visualized polypoid  mucosal thickening or small mucous retention cyst in the left maxillary sinus. Visualized mastoid air cells are clear. Skeleton: There is a heterogeneous gas and fluid collection along the medial aspect of the right sternocleidomastoid muscle in the lower neck beginning near the clavicular head. This collection extends posteriorly and inferiorly into the anterior mediastinum, more fully evaluated on the separate chest CT. Phlegmon extends superiorly within/along the right sternocleidomastoid muscle in the lower and mid neck. Fluid extends superiorly in the right neck deep to the strap muscles and contains a few locules of gas, however this fluid does not appear as organized as the collections in the lower neck and upper chest. Inflammation in the right neck extends just superior to the level of the thyroid cartilage. No erosive changes are seen at the right sternoclavicular joint. Upper chest: Reported separately. Other: None. IMPRESSION: Gas and fluid collections in the anterior upper chest as reported on separate chest CT, concerning for abscesses. Phlegmon and less organized gas and fluid extend superiorly in the anterior right neck with suspected myositis of the right sternocleidomastoid muscle. Electronically Signed   By: ALogan BoresM.D.   On: 06/05/2017 16:31   Ct Chest W Contrast  Result Date: 06/05/2017 CLINICAL DATA:  Patient with shortness of breath. Red raised area anterior lower neck. EXAM: CT CHEST WITH CONTRAST TECHNIQUE: Multidetector CT imaging of the chest was performed during intravenous contrast administration. CONTRAST:  1533mISOVUE-300 IOPAMIDOL (ISOVUE-300) INJECTION 61% COMPARISON:  Chest CT 06/21/2005; neck CT same day. FINDINGS: Cardiovascular: Normal heart size. No pericardial effusion. Normal caliber thoracic aorta. Enlarged main pulmonary artery (3.3 cm) as can be seen with pulmonary arterial hypertension. Mediastinum/Nodes: 1.2 cm AP window lymph node (image 50; series 2). 7 mm  right paratracheal lymph node (image 50; series 2). Normal appearance of the esophagus. No axillary adenopathy. Lungs/Pleura: Central airways are patent. Subpleural consolidation with adjacent tree-in-bud ground-glass nodularity within the right lower lobe (image 114; series 4). Within the right upper lobe there multiple areas of regional ground-glass attenuation. No pleural effusion or pneumothorax. Upper Abdomen: No acute process. Musculoskeletal: There is a large centrally necrotic peripherally enhancing mass within the lower neck which extends from the anterior aspect of the right sternal clavicular joint/distal right sternocleidomastoid superiorly along the right aspect of the thyroid and inferiorly within the retrosternal location. The largest portion of this mass measures 4.7 x 4.1 cm. There are multiple small foci of gas within the mass. No definite osseous destruction demonstrated within the right sternoclavicular joint. IMPRESSION: 1. There is a large peripherally enhancing mass with central fluid and gas within the right lower neck extending from the anterior aspect of the right sternoclavicular location posteriorly along the retrosternal soft tissues. Findings are concerning for abscess. No definite osseous destruction at this time of the right sternoclavicular joint  or adjacent costochondral joint however secondary infection of these joints is not excluded given the size of the suspected abscess. 2. Consolidation within the right lower lobe with right upper lobe ground-glass opacities favored to represent multifocal pneumonia. 3. Mediastinal and right hilar adenopathy likely reactive in etiology. Critical Value/emergent results were called by telephone at the time of interpretation on 06/05/2017 at 4:18 pm to Dr. Jeneen Rinks, who verbally acknowledged these results. Electronically Signed   By: Lovey Newcomer M.D.   On: 06/05/2017 16:29   Mr Hip Left W Wo Contrast  Result Date: 06/06/2017 CLINICAL DATA:   Severe left hip pain for the past few weeks. History of IV drug abuse and recurrent skin and soft tissue infections. EXAM: MRI OF THE LEFT HIP WITHOUT AND WITH CONTRAST TECHNIQUE: Multiplanar, multisequence MR imaging was performed both before and after administration of intravenous contrast. CONTRAST:  84m MULTIHANCE GADOBENATE DIMEGLUMINE 529 MG/ML IV SOLN COMPARISON:  None. FINDINGS: Bones: There is no evidence of acute fracture, dislocation or avascular necrosis. The visualized bony pelvis appears normal. The visualized sacroiliac joints and symphysis pubis appear normal. Articular cartilage and labrum Articular cartilage: No focal chondral defect or subchondral signal abnormality identified. Labrum: There is a tear of the left anterior superior labrum. Joint or bursal effusion Joint effusion: Small left hip joint effusion with prominent synovial enhancement. No right hip joint effusion. Bursae: Prominent, rim enhancing fluid within the left iliopsoas bursa, extending superiorly into the left iliacus muscle. The bursa measures up to 2.5 x 3.3 cm in maximal AP by TR dimension. No greater trochanteric bursal fluid. Muscles and tendons Muscles and tendons: Prominent muscle edema and enhancement of the left iliacus muscle. Mild myofascial edema along the left vastus and adductor compartments. The visualized gluteus, hamstring and iliopsoas tendons appear normal. The piriformis muscles appear symmetric. Other findings Miscellaneous: Trace presacral edema. The visualized internal pelvic contents otherwise appear unremarkable. IMPRESSION: 1. Findings consistent with left hip septic arthritis and left iliopsoas septic bursitis. The left iliopsoas bursa extends into the left iliacus muscle, which demonstrates prominent muscle edema and enhancement, consistent with infectious myositis. The preliminary findings were called by telephone at the time of interpretation on 06/06/2017 at 4:00 am to Dr. TChristia ReadingOPYD, by Dr. AAnner Crete Electronically Signed   By: WTitus DubinM.D.   On: 06/06/2017 08:15    Review of Systems  Constitutional: Negative for chills, fever and weight loss.  HENT: Negative for ear discharge, ear pain, hearing loss and tinnitus.   Eyes: Negative for blurred vision, double vision, photophobia and pain.  Respiratory: Positive for cough. Negative for sputum production and shortness of breath.   Cardiovascular: Negative for chest pain.  Gastrointestinal: Negative for abdominal pain, nausea and vomiting.  Genitourinary: Negative for dysuria, flank pain, frequency and urgency.  Musculoskeletal: Positive for joint pain (Left hip). Negative for back pain, falls, myalgias and neck pain.  Neurological: Negative for dizziness, tingling, sensory change, focal weakness, loss of consciousness and headaches.  Endo/Heme/Allergies: Does not bruise/bleed easily.  Psychiatric/Behavioral: Negative for depression, memory loss and substance abuse. The patient is not nervous/anxious.    Blood pressure 123/78, pulse (!) 106, temperature 98.9 F (37.2 C), temperature source Oral, resp. rate 20, height '5\' 10"'  (1.778 m), weight 70.3 kg (154 lb 15.7 oz), SpO2 99 %. Physical Exam  Constitutional: He appears well-developed and well-nourished. No distress.  HENT:  Head: Normocephalic.  Eyes: Conjunctivae are normal. Right eye exhibits no discharge. Left eye exhibits no discharge. No  scleral icterus.  Cardiovascular: Normal rate and regular rhythm.  Respiratory: Effort normal. No respiratory distress.  Musculoskeletal:  LLE No traumatic wounds, ecchymosis, or rash  Nontender, pain with PROM at hip limited to 135 to 90, AROM about 5  No knee or ankle effusion  Knee stable to varus/ valgus and anterior/posterior stress  Sens DPN, SPN, TN intact  Motor EHL, ext, flex, evers 5/5  DP 2+, PT 2+, No significant edema  Neurological: He is alert.  Skin: Skin is warm and dry. He is not diaphoretic.  Psychiatric:  He has a normal mood and affect. His behavior is normal.    Assessment/Plan: Left hip pain -- His history and MRI certainly suggest septic arthritis as a diagnosis though his passive motion is a better than I would expect. Given the morbidity will ask IR to aspirate joint to be sure. Will follow with you.    Lisette Abu, PA-C Orthopedic Surgery [phone] 06/06/2017, 9:41 AM

## 2017-06-06 NOTE — Transfer of Care (Signed)
Immediate Anesthesia Transfer of Care Note  Patient: Clifford Tucker  Procedure(s) Performed: IRRIGATION AND DEBRIDEMENT HIP (Left )  Patient Location: PACU  Anesthesia Type:General  Level of Consciousness: awake and patient cooperative  Airway & Oxygen Therapy: Patient Spontanous Breathing  Post-op Assessment: Report given to RN and Post -op Vital signs reviewed and stable  Post vital signs: Reviewed and stable  Last Vitals:  Vitals:   06/06/17 0750 06/06/17 0844  BP:  123/78  Pulse: (!) 106 (!) 106  Resp:  20  Temp:  37.2 C  SpO2: 95% 99%    Last Pain:  Vitals:   06/06/17 1059  TempSrc:   PainSc: 8          Complications: No apparent anesthesia complications

## 2017-06-06 NOTE — Progress Notes (Signed)
PROGRESS NOTE    Brondon Wann  JYN:[id number] DOB: Dec 13, 1980 DOA: 06/05/2017 PCP: Patient, No Pcp Per   Brief Narrative: Cruise Baumgardner is a 37 y.o. male with medical history significant for IV drug abuse and recurrent skin and soft tissue infections. He presented with neck/hip pain found to have a neck abscess, septic arthritis of the hip and now MSSA bacteremia. CT surgery, orthopedic surgery and infectious disease consulted. Plan for I&D by CT surgery on 1/16.   Assessment & Plan:   Principal Problem:   Bacteremia due to methicillin susceptible Staphylococcus aureus (MSSA) Active Problems:   Multifocal pneumonia   Neck abscess   IV drug user   Left hip pain   Septic arthritis of sternoclavicular joint, right (HCC)   Iliopsoas abscess on left (HCC)   Normocytic anemia   MSSA bacteremia 2/2 blood cultures positive. Secondary to IV drug use. Patient reports having history of multiple abscesses over the last few weeks. Would have concern for endocarditis. Meets 1 major and 2 minor Duke Criteria. -Transition antibiotics from Vancomycin and Zosyn to Ancef -Transthoracic Echocardiogram -ID recommendations  Neck abscess CT surgery consulted and will take to OR tomorrow -Antibiotics above  Multifocal pneumonia Seen on CT chest -Antibiotics above  Left hip/iliopsoas septic arthritis Infectious myositis -Consult orthopedic surgery -Antibiotics above  IV drug abuse Drug of choice is heroin injected into his arms.   DVT prophylaxis: Lovenox Code Status: Full code Family Communication: Wife at bedside Disposition Plan: When medical stable.   Consultants:   Infectious disease  Orthopedic surgery  CT surgery  Procedures:   None  Antimicrobials:  Vancomycin (1/14)  Zosyn (1/14)  Ancef (1/15>>    Subjective: Afebrile overnight. Patient reports wanting to quit. States he hopes to be around for his daughter. Realizes the seriousness of this  hospitalization.  Objective: Vitals:   06/05/17 2330 06/06/17 0744 06/06/17 0750 06/06/17 0844  BP: 113/66 111/71  123/78  Pulse: 100  (!) 106 (!) 106  Resp: (!) 22   20  Temp:    98.9 F (37.2 C)  TempSrc:    Oral  SpO2: 96%  95% 99%  Weight:    70.3 kg (154 lb 15.7 oz)  Height:    5\' 10"  (1.778 m)    Intake/Output Summary (Last 24 hours) at 06/06/2017 1253 Last data filed at 06/06/2017 0016 Gross per 24 hour  Intake 1350 ml  Output -  Net 1350 ml   Filed Weights   06/05/17 1407 06/06/17 0844  Weight: 68 kg (150 lb) 70.3 kg (154 lb 15.7 oz)    Examination:  General exam: Appears calm and comfortable Respiratory system: Clear to auscultation. Respiratory effort normal. Cardiovascular system: S1 & S2 heard, RRR. No murmurs, rubs, gallops or clicks. Gastrointestinal system: Abdomen is nondistended, soft and nontender. No organomegaly or masses felt. Normal bowel sounds heard. Central nervous system: Alert and oriented. No focal neurological deficits. Extremities: No edema. No calf tenderness Skin: No cyanosis. No rashes. Large abscess of right lower neck Psychiatry: Judgement and insight appear normal. Mood & affect appropriate. Tearful.    Data Reviewed: I have personally reviewed following labs and imaging studies  CBC: Recent Labs  Lab 06/05/17 1444 06/05/17 1457 06/06/17 0315  WBC 23.0*  --  19.7*  NEUTROABS 18.8*  --  15.1*  HGB 13.3 14.6 11.0*  HCT 39.4 43.0 33.1*  MCV 90.6  --  90.2  PLT 318  --  317   Basic Metabolic Panel: Recent Labs  Lab 06/05/17 1444 06/05/17 1457 06/06/17 0315  NA 137 135 134*  K 3.7 3.6 4.0  CL 96* 95* 99*  CO2 27  --  24  GLUCOSE 109* 106* 82  BUN 9 6 7   CREATININE 0.66 0.70 0.58*  CALCIUM 9.0  --  8.1*   GFR: Estimated Creatinine Clearance: 128.2 mL/min (A) (by C-G formula based on SCr of 0.58 mg/dL (L)). Liver Function Tests: Recent Labs  Lab 06/05/17 1444 06/06/17 0315  AST 67* 43*  ALT 63 47  ALKPHOS 201*  176*  BILITOT 0.5 0.3  PROT 7.5 6.4*  ALBUMIN 2.9* 2.3*   No results for input(s): LIPASE, AMYLASE in the last 168 hours. No results for input(s): AMMONIA in the last 168 hours. Coagulation Profile: No results for input(s): INR, PROTIME in the last 168 hours. Cardiac Enzymes: No results for input(s): CKTOTAL, CKMB, CKMBINDEX, TROPONINI in the last 168 hours. BNP (last 3 results) No results for input(s): PROBNP in the last 8760 hours. HbA1C: No results for input(s): HGBA1C in the last 72 hours. CBG: No results for input(s): GLUCAP in the last 168 hours. Lipid Profile: No results for input(s): CHOL, HDL, LDLCALC, TRIG, CHOLHDL, LDLDIRECT in the last 72 hours. Thyroid Function Tests: No results for input(s): TSH, T4TOTAL, FREET4, T3FREE, THYROIDAB in the last 72 hours. Anemia Panel: No results for input(s): VITAMINB12, FOLATE, FERRITIN, TIBC, IRON, RETICCTPCT in the last 72 hours. Sepsis Labs: Recent Labs  Lab 06/05/17 1641  LATICACIDVEN 0.82    Recent Results (from the past 240 hour(s))  Blood culture (routine x 2)     Status: None (Preliminary result)   Collection Time: 06/05/17  2:44 PM  Result Value Ref Range Status   Specimen Description RIGHT ANTECUBITAL  Final   Special Requests   Final    BOTTLES DRAWN AEROBIC ONLY Blood Culture adequate volume   Culture  Setup Time   Final    GRAM POSITIVE COCCI Gram Stain Report Called to,Read Back By and Verified With: ROWE,C AT 0730 BY HUFFINES,S ON 06/06/17.    Culture PENDING  Incomplete   Report Status PENDING  Incomplete  Blood culture (routine x 2)     Status: None (Preliminary result)   Collection Time: 06/05/17  2:47 PM  Result Value Ref Range Status   Specimen Description LEFT ANTECUBITAL  Final   Special Requests   Final    BOTTLES DRAWN AEROBIC AND ANAEROBIC Blood Culture adequate volume   Culture  Setup Time   Final    GRAM POSITIVE COCCI BOTH AEB AND ANA Gram Stain Report Called to,Read Back By and Verified  With: J.CRUISE, RN @ [facility] @ 0545 ON 1.15.19 BY BOWMAN,L Organism ID to follow CRITICAL RESULT CALLED TO, READ BACK BY AND VERIFIED WITH: N. Batchelder Pharm.D. 8:55 06/06/17 ([email or web address])    Culture   Final    NO GROWTH < 24 HOURS Performed at [facility], [address] [place], [place] [address]    Report Status PENDING  Incomplete  Blood Culture ID Panel (Reflexed)     Status: Abnormal   Collection Time: 06/05/17  2:47 PM  Result Value Ref Range Status   Enterococcus species NOT DETECTED NOT DETECTED Final   Listeria monocytogenes NOT DETECTED NOT DETECTED Final   Staphylococcus species DETECTED (A) NOT DETECTED Final    Comment: CRITICAL RESULT CALLED TO, READ BACK BY AND VERIFIED WITH: N. Batchelder Pharm.D. 8:55 06/06/17 ([email or web address])    Staphylococcus aureus DETECTED (A) NOT DETECTED Final  Comment: Methicillin (oxacillin) susceptible Staphylococcus aureus (MSSA). Preferred therapy is anti staphylococcal beta lactam antibiotic (Cefazolin or Nafcillin), unless clinically contraindicated. CRITICAL RESULT CALLED TO, READ BACK BY AND VERIFIED WITH: N. Batchelder Pharm.D. 8:55 06/06/17 ([email or web address])    Methicillin resistance NOT DETECTED NOT DETECTED Final   Streptococcus species NOT DETECTED NOT DETECTED Final   Streptococcus agalactiae NOT DETECTED NOT DETECTED Final   Streptococcus pneumoniae NOT DETECTED NOT DETECTED Final   Streptococcus pyogenes NOT DETECTED NOT DETECTED Final   Acinetobacter baumannii NOT DETECTED NOT DETECTED Final   Enterobacteriaceae species NOT DETECTED NOT DETECTED Final   Enterobacter cloacae complex NOT DETECTED NOT DETECTED Final   Escherichia coli NOT DETECTED NOT DETECTED Final   Klebsiella oxytoca NOT DETECTED NOT DETECTED Final   Klebsiella pneumoniae NOT DETECTED NOT DETECTED Final   Proteus species NOT DETECTED NOT DETECTED Final   Serratia marcescens NOT DETECTED NOT DETECTED Final   Haemophilus influenzae NOT DETECTED NOT DETECTED Final    Neisseria meningitidis NOT DETECTED NOT DETECTED Final   Pseudomonas aeruginosa NOT DETECTED NOT DETECTED Final   Candida albicans NOT DETECTED NOT DETECTED Final   Candida glabrata NOT DETECTED NOT DETECTED Final   Candida krusei NOT DETECTED NOT DETECTED Final   Candida parapsilosis NOT DETECTED NOT DETECTED Final   Candida tropicalis NOT DETECTED NOT DETECTED Final         Radiology Studies: Dg Chest 2 View  Result Date: 06/05/2017 CLINICAL DATA:  Shortness of Breath EXAM: CHEST  2 VIEW COMPARISON:  December 01, 2011 FINDINGS: There is patchy infiltrate in the lateral right base. Lungs elsewhere are clear. Heart size and pulmonary vascularity are normal. No adenopathy. No bone lesions. IMPRESSION: Infiltrate consistent with pneumonia lateral right base. Lungs elsewhere clear. Cardiac silhouette within normal limits. Electronically Signed   By: Bretta Bang III M.D.   On: 06/05/2017 14:47   Ct Soft Tissue Neck W Contrast  Result Date: 06/05/2017 CLINICAL DATA:  Neck mass.  Hard, red, raised area in the neck. EXAM: CT NECK WITH CONTRAST TECHNIQUE: Multidetector CT imaging of the neck was performed using the standard protocol following the bolus administration of intravenous contrast. CONTRAST:  ISOVUE-300 IOPAMIDOL (ISOVUE-300) INJECTION 61% COMPARISON:  None. FINDINGS: Pharynx and larynx: No pharyngeal or laryngeal mass. Patent airway. No retropharyngeal fluid collection. Salivary glands: No inflammation, mass, or stone. Thyroid: Mass effect on the right thyroid lobe by the inflammatory process described below. No intrinsic thyroid abnormality identified. Lymph nodes: Subcentimeter but asymmetric anterior and posterior cervical lymph nodes throughout the right neck, likely reactive. Vascular: Major vascular structures of the neck are patent. The below described inflammatory process extends into the right carotid space in the lower neck. Limited intracranial: Unremarkable. Visualized  orbits: Not imaged. Mastoids and visualized paranasal sinuses: Partially visualized polypoid mucosal thickening or small mucous retention cyst in the left maxillary sinus. Visualized mastoid air cells are clear. Skeleton: There is a heterogeneous gas and fluid collection along the medial aspect of the right sternocleidomastoid muscle in the lower neck beginning near the clavicular head. This collection extends posteriorly and inferiorly into the anterior mediastinum, more fully evaluated on the separate chest CT. Phlegmon extends superiorly within/along the right sternocleidomastoid muscle in the lower and mid neck. Fluid extends superiorly in the right neck deep to the strap muscles and contains a few locules of gas, however this fluid does not appear as organized as the collections in the lower neck and upper chest. Inflammation in the right neck extends just  superior to the level of the thyroid cartilage. No erosive changes are seen at the right sternoclavicular joint. Upper chest: Reported separately. Other: None. IMPRESSION: Gas and fluid collections in the anterior upper chest as reported on separate chest CT, concerning for abscesses. Phlegmon and less organized gas and fluid extend superiorly in the anterior right neck with suspected myositis of the right sternocleidomastoid muscle. Electronically Signed   By: Sebastian Ache M.D.   On: 06/05/2017 16:31   Ct Chest W Contrast  Result Date: 06/05/2017 CLINICAL DATA:  Patient with shortness of breath. Red raised area anterior lower neck. EXAM: CT CHEST WITH CONTRAST TECHNIQUE: Multidetector CT imaging of the chest was performed during intravenous contrast administration. CONTRAST:  ISOVUE-300 IOPAMIDOL (ISOVUE-300) INJECTION 61% COMPARISON:  Chest CT 06/21/2005; neck CT same day. FINDINGS: Cardiovascular: Normal heart size. No pericardial effusion. Normal caliber thoracic aorta. Enlarged main pulmonary artery (3.3 cm) as can be seen with pulmonary  arterial hypertension. Mediastinum/Nodes: 1.2 cm AP window lymph node (image 50; series 2). 7 mm right paratracheal lymph node (image 50; series 2). Normal appearance of the esophagus. No axillary adenopathy. Lungs/Pleura: Central airways are patent. Subpleural consolidation with adjacent tree-in-bud ground-glass nodularity within the right lower lobe (image 114; series 4). Within the right upper lobe there multiple areas of regional ground-glass attenuation. No pleural effusion or pneumothorax. Upper Abdomen: No acute process. Musculoskeletal: There is a large centrally necrotic peripherally enhancing mass within the lower neck which extends from the anterior aspect of the right sternal clavicular joint/distal right sternocleidomastoid superiorly along the right aspect of the thyroid and inferiorly within the retrosternal location. The largest portion of this mass measures 4.7 x 4.1 cm. There are multiple small foci of gas within the mass. No definite osseous destruction demonstrated within the right sternoclavicular joint. IMPRESSION: 1. There is a large peripherally enhancing mass with central fluid and gas within the right lower neck extending from the anterior aspect of the right sternoclavicular location posteriorly along the retrosternal soft tissues. Findings are concerning for abscess. No definite osseous destruction at this time of the right sternoclavicular joint or adjacent costochondral joint however secondary infection of these joints is not excluded given the size of the suspected abscess. 2. Consolidation within the right lower lobe with right upper lobe ground-glass opacities favored to represent multifocal pneumonia. 3. Mediastinal and right hilar adenopathy likely reactive in etiology. Critical Value/emergent results were called by telephone at the time of interpretation on 06/05/2017 at 4:18 pm to Dr. Fayrene Fearing, who verbally acknowledged these results. Electronically Signed   By: Annia Belt M.D.    On: 06/05/2017 16:29   Mr Hip Left W Wo Contrast  Result Date: 06/06/2017 CLINICAL DATA:  Severe left hip pain for the past few weeks. History of IV drug abuse and recurrent skin and soft tissue infections. EXAM: MRI OF THE LEFT HIP WITHOUT AND WITH CONTRAST TECHNIQUE: Multiplanar, multisequence MR imaging was performed both before and after administration of intravenous contrast. CONTRAST:  15mL MULTIHANCE GADOBENATE DIMEGLUMINE 529 MG/ML IV SOLN COMPARISON:  None. FINDINGS: Bones: There is no evidence of acute fracture, dislocation or avascular necrosis. The visualized bony pelvis appears normal. The visualized sacroiliac joints and symphysis pubis appear normal. Articular cartilage and labrum Articular cartilage: No focal chondral defect or subchondral signal abnormality identified. Labrum: There is a tear of the left anterior superior labrum. Joint or bursal effusion Joint effusion: Small left hip joint effusion with prominent synovial enhancement. No right hip joint effusion. Bursae: Prominent,  rim enhancing fluid within the left iliopsoas bursa, extending superiorly into the left iliacus muscle. The bursa measures up to 2.5 x 3.3 cm in maximal AP by TR dimension. No greater trochanteric bursal fluid. Muscles and tendons Muscles and tendons: Prominent muscle edema and enhancement of the left iliacus muscle. Mild myofascial edema along the left vastus and adductor compartments. The visualized gluteus, hamstring and iliopsoas tendons appear normal. The piriformis muscles appear symmetric. Other findings Miscellaneous: Trace presacral edema. The visualized internal pelvic contents otherwise appear unremarkable. IMPRESSION: 1. Findings consistent with left hip septic arthritis and left iliopsoas septic bursitis. The left iliopsoas bursa extends into the left iliacus muscle, which demonstrates prominent muscle edema and enhancement, consistent with infectious myositis. The preliminary findings were called by  telephone at the time of interpretation on 06/06/2017 at 4:00 am to Dr. Marcial Pacas OPYD, by Dr. Elgie Collard. Electronically Signed   By: Obie Dredge M.D.   On: 06/06/2017 08:15        Scheduled Meds: . iopamidol      . lidocaine (PF)      . sodium chloride      . enoxaparin (LOVENOX) injection  40 mg Subcutaneous Q24H   Continuous Infusions: .  ceFAZolin (ANCEF) IV Stopped (06/06/17 1130)     LOS: 1 day     Jacquelin Hawking, MD [facility] 06/06/2017, 12:53 PM Pager: [phone]  If 7PM-7AM, please contact night-coverage [email or web address] Password [facility] 06/06/2017, 12:53 PM

## 2017-06-06 NOTE — Anesthesia Preprocedure Evaluation (Signed)
Anesthesia Evaluation  Patient identified by MRN, date of birth, ID band Patient awake    Reviewed: Allergy & Precautions, NPO status , Patient's Chart, lab work & pertinent test results  Airway Mallampati: II       Dental  (+) Poor Dentition   Pulmonary Current Smoker,    Pulmonary exam normal breath sounds clear to auscultation       Cardiovascular negative cardio ROS   Rhythm:Regular Rate:Tachycardia     Neuro/Psych negative psych ROS   GI/Hepatic negative GI ROS, Neg liver ROS,   Endo/Other  negative endocrine ROS  Renal/GU negative Renal ROS     Musculoskeletal  (+) Arthritis , Septic   Abdominal Normal abdominal exam  (+)   Peds  Hematology  (+) Blood dyscrasia, anemia ,   Anesthesia Other Findings CLINICAL DATA:  Patient with shortness of breath. Red raised area anterior lower neck.  EXAM: CT CHEST WITH CONTRAST  TECHNIQUE: Multidetector CT imaging of the chest was performed during intravenous contrast administration.  CONTRAST:  150mL ISOVUE-300 IOPAMIDOL (ISOVUE-300) INJECTION 61%  COMPARISON:  Chest CT 06/21/2005; neck CT same day.  FINDINGS: Cardiovascular: Normal heart size. No pericardial effusion. Normal caliber thoracic aorta. Enlarged main pulmonary artery (3.3 cm) as can be seen with pulmonary arterial hypertension.  Mediastinum/Nodes: 1.2 cm AP window lymph node (image 50; series 2). 7 mm right paratracheal lymph node (image 50; series 2). Normal appearance of the esophagus. No axillary adenopathy.  Lungs/Pleura: Central airways are patent. Subpleural consolidation with adjacent tree-in-bud ground-glass nodularity within the right lower lobe (image 114; series 4). Within the right upper lobe there multiple areas of regional ground-glass attenuation. No pleural effusion or pneumothorax.  Upper Abdomen: No acute process.  Musculoskeletal: There is a large centrally  necrotic peripherally enhancing mass within the lower neck which extends from the anterior aspect of the right sternal clavicular joint/distal right sternocleidomastoid superiorly along the right aspect of the thyroid and inferiorly within the retrosternal location. The largest portion of this mass measures 4.7 x 4.1 cm. There are multiple small foci of gas within the mass. No definite osseous destruction demonstrated within the right sternoclavicular joint.  IMPRESSION: 1. There is a large peripherally enhancing mass with central fluid and gas within the right lower neck extending from the anterior aspect of the right sternoclavicular location posteriorly along the retrosternal soft tissues. Findings are concerning for abscess. No definite osseous destruction at this time of the right sternoclavicular joint or adjacent costochondral joint however secondary infection of these joints is not excluded given the size of the suspected abscess. 2. Consolidation within the right lower lobe with right upper lobe ground-glass opacities favored to represent multifocal pneumonia. 3. Mediastinal and right hilar adenopathy likely reactive in etiology.  Critical Value/emergent results were called by telephone at the time of interpretation on 06/05/2017 at 4:18 pm to Dr. Fayrene FearingJames, who verbally acknowledged     Reproductive/Obstetrics                             Anesthesia Physical Anesthesia Plan  ASA: II  Anesthesia Plan: General   Post-op Pain Management:    Induction: Intravenous  PONV Risk Score and Plan: 2 and Ondansetron and Dexamethasone  Airway Management Planned: Oral ETT  Additional Equipment:   Intra-op Plan:   Post-operative Plan: Extubation in OR  Informed Consent: I have reviewed the patients History and Physical, chart, labs and discussed the procedure including the risks,  benefits and alternatives for the proposed anesthesia with the patient  or authorized representative who has indicated his/her understanding and acceptance.   Dental advisory given  Plan Discussed with: CRNA and Surgeon  Anesthesia Plan Comments:         Anesthesia Quick Evaluation

## 2017-06-06 NOTE — Brief Op Note (Signed)
06/05/2017 - 06/06/2017  5:10 PM  PATIENT:  Clifford Tucker  37 y.o. male  PRE-OPERATIVE DIAGNOSIS:  left hip infection  POST-OPERATIVE DIAGNOSIS:  left hip infection  PROCEDURE:  Procedure(s): IRRIGATION AND DEBRIDEMENT HIP  SURGEON:  Surgeon(s): Cammy Copaean, Charletha Dalpe Scott, MD  ASSISTANT: Patrick Jupiterarla Bethune rnfa  ANESTHESIA:   general  EBL: 50 ml    Total I/O In: 1000 [I.V.:1000] Out: -   BLOOD ADMINISTERED: none  DRAINS: (Left hip region) Hemovact drain(s) in the Left hip region with  Suction Open   LOCAL MEDICATIONS USED:  none  SPECIMEN: Cultures x2 sent to microbiology COUNTS:  YES  TOURNIQUET:  * No tourniquets in log *  DICTATION: .Other Dictation: Dictation Number [id number]  PLAN OF CARE: Admit to inpatient   PATIENT DISPOSITION:  PACU - hemodynamically stable

## 2017-06-06 NOTE — ED Notes (Signed)
surgery at bedside

## 2017-06-06 NOTE — Progress Notes (Signed)
I was unable to perform the echo today because the patient was in the OR. We will do the echo tomorrow.

## 2017-06-06 NOTE — Consult Note (Signed)
[facility]    Date of Admission:  06/05/2017   Total days of antibiotics 2        Day 1 cefazolin              Reason for Consult: Automatic consultation for staph aureus bacteremia      Assessment: He has MSSA bacteremia complicated by septic arthritis of the sternoclavicular joint, neck abscesses, left iliopsoas abscess, possible left septic hip and multifocal pneumonia.  He is at high risk for endocarditis.  I agree with the switch to cefazolin.  I will repeat blood cultures.  TTE is pending.  He is scheduled for surgery on his neck tomorrow.  Interventional radiology has been asked to aspirate his left hip.  Plan: 1. Continue cefazolin 2. Repeat blood cultures 3. TTE 4. Await results of surgery and hip aspirate 5. 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 enoxaparin (LOVENOX) injection  40 mg Subcutaneous Q24H   Continuous Infusions: .  ceFAZolin (ANCEF) IV 2 g (06/06/17 1058)   PRN Meds:.acetaminophen **OR** acetaminophen, diphenhydrAMINE, HYDROmorphone (DILAUDID) injection, ketorolac, ondansetron **OR** ondansetron (ZOFRAN) IV, senna-docusate  HPI: Clifford Tucker is a 37 y.o. male with a history of injecting drug use who has been having progressively severe left hip pain for the past 5-6 weeks.  He recently began to have pain and swelling.  He has been having chills, poor appetite and weight loss.  He was admitted yesterday and both admission blood cultures are growing MSSA.  CT scans reveal a left iliopsoas abscess and effusion.  He also has right sternoclavicular infection with associated neck abscesses.   Review of  Systems: Review of Systems  Constitutional: Positive for chills, diaphoresis, malaise/fatigue and weight loss. Negative for fever.  HENT: Negative for congestion and sore throat.   Respiratory: Positive for cough, sputum production and shortness of breath.   Cardiovascular: Negative for chest pain.  Gastrointestinal: Negative for abdominal pain, diarrhea, nausea and vomiting.  Genitourinary: Negative for dysuria.  Musculoskeletal: Positive for joint pain and neck pain.  Skin: Negative for rash.  Neurological: Positive for headaches. Negative for focal weakness.    Past Medical History:  Diagnosis Date  . Pneumonia     Social History   Tobacco Use  . Smoking status: Current Every Day Smoker    Packs/day: 1.00  . Smokeless tobacco: Never Used  Substance Use Topics  . Alcohol use: No  . Drug use: Yes    Types: Marijuana    Comment: 06/05/17    Family History  Problem Relation Age of Onset  . Drug abuse Mother    No Known Allergies  OBJECTIVE: Blood pressure 123/78, pulse (!) 106, temperature 98.9 F (37.2 C), temperature source Oral, resp. rate 20, height 5\' 10"  (1.778 m), weight 154 lb 15.7 oz (70.3 kg), SpO2 99 %.  Physical Exam  Constitutional: He is oriented to person, place, and time.  He appears uncomfortable due to pain.  Eyes: Conjunctivae are normal.  Neck: Neck supple.  He has swelling, warmth and slight erythema on the right side of his neck extending down to the  level of the sternoclavicular joint.  He is quite tender.  Cardiovascular: Normal rate and regular rhythm.  No murmur heard. Pulmonary/Chest: Effort normal and breath sounds normal. He has no wheezes. He has no rales.  Abdominal: Soft. He exhibits no distension. There is no tenderness.  Musculoskeletal: Normal range of motion. He exhibits no edema or tenderness.  Neurological: He is alert and oriented to person, place, and time.  Skin: No rash noted.  Psychiatric: Mood and affect normal.    Lab  Results Lab Results  Component Value Date   WBC 19.7 (H) 06/06/2017   HGB 11.0 (L) 06/06/2017   HCT 33.1 (L) 06/06/2017   MCV 90.2 06/06/2017   PLT 317 06/06/2017    Lab Results  Component Value Date   CREATININE 0.58 (L) 06/06/2017   BUN 7 06/06/2017   NA 134 (L) 06/06/2017   K 4.0 06/06/2017   CL 99 (L) 06/06/2017   CO2 24 06/06/2017    Lab Results  Component Value Date   ALT 47 06/06/2017   AST 43 (H) 06/06/2017   ALKPHOS 176 (H) 06/06/2017   BILITOT 0.3 06/06/2017     Microbiology: Recent Results (from the past 240 hour(s))  Blood culture (routine x 2)     Status: None (Preliminary result)   Collection Time: 06/05/17  2:44 PM  Result Value Ref Range Status   Specimen Description RIGHT ANTECUBITAL  Final   Special Requests   Final    BOTTLES DRAWN AEROBIC ONLY Blood Culture adequate volume   Culture  Setup Time   Final    GRAM POSITIVE COCCI Gram Stain Report Called to,Read Back By and Verified With: ROWE,C AT 0730 BY HUFFINES,S ON 06/06/17.    Culture PENDING  Incomplete   Report Status PENDING  Incomplete  Blood culture (routine x 2)     Status: None (Preliminary result)   Collection Time: 06/05/17  2:47 PM  Result Value Ref Range Status   Specimen Description LEFT ANTECUBITAL  Final   Special Requests   Final    BOTTLES DRAWN AEROBIC AND ANAEROBIC Blood Culture adequate volume   Culture  Setup Time   Final    GRAM POSITIVE COCCI BOTH AEB AND ANA Gram Stain Report Called to,Read Back By and Verified With: J.CRUISE, RN @ [facility] ED @ 0545 ON 1.15.19 BY BOWMAN,L Organism ID to follow CRITICAL RESULT CALLED TO, READ BACK BY AND VERIFIED WITH: N. Batchelder Pharm.D. 8:55 06/06/17 ([email or web address])    Culture   Final    NO GROWTH < 24 HOURS Performed at [facility], [address] [place], [place] [address]    Report Status PENDING  Incomplete  Blood Culture ID Panel (Reflexed)     Status: Abnormal   Collection Time: 06/05/17  2:47 PM  Result Value Ref Range  Status   Enterococcus species NOT DETECTED NOT DETECTED Final   Listeria monocytogenes NOT DETECTED NOT DETECTED Final   Staphylococcus species DETECTED (A) NOT DETECTED Final    Comment: CRITICAL RESULT CALLED TO, READ BACK BY AND VERIFIED WITH: N. Batchelder Pharm.D. 8:55 06/06/17 ([email or web address])    Staphylococcus aureus DETECTED (A) NOT DETECTED Final    Comment: Methicillin (oxacillin) susceptible Staphylococcus aureus (MSSA). Preferred therapy is anti staphylococcal beta lactam antibiotic (Cefazolin or Nafcillin), unless clinically contraindicated. CRITICAL RESULT CALLED TO, READ BACK BY AND VERIFIED WITH: N. Batchelder Pharm.D. 8:55 06/06/17 ([email or web address])    Methicillin resistance NOT DETECTED NOT DETECTED Final   Streptococcus species NOT DETECTED NOT DETECTED  Final   Streptococcus agalactiae NOT DETECTED NOT DETECTED Final   Streptococcus pneumoniae NOT DETECTED NOT DETECTED Final   Streptococcus pyogenes NOT DETECTED NOT DETECTED Final   Acinetobacter baumannii NOT DETECTED NOT DETECTED Final   Enterobacteriaceae species NOT DETECTED NOT DETECTED Final   Enterobacter cloacae complex NOT DETECTED NOT DETECTED Final   Escherichia coli NOT DETECTED NOT DETECTED Final   Klebsiella oxytoca NOT DETECTED NOT DETECTED Final   Klebsiella pneumoniae NOT DETECTED NOT DETECTED Final   Proteus species NOT DETECTED NOT DETECTED Final   Serratia marcescens NOT DETECTED NOT DETECTED Final   Haemophilus influenzae NOT DETECTED NOT DETECTED Final   Neisseria meningitidis NOT DETECTED NOT DETECTED Final   Pseudomonas aeruginosa NOT DETECTED NOT DETECTED Final   Candida albicans NOT DETECTED NOT DETECTED Final   Candida glabrata NOT DETECTED NOT DETECTED Final   Candida krusei NOT DETECTED NOT DETECTED Final   Candida parapsilosis NOT DETECTED NOT DETECTED Final   Candida tropicalis NOT DETECTED NOT DETECTED Final    Cliffton Asters, MD [facility] [phone] pager   [phone] cell 06/06/2017, 12:33 PM

## 2017-06-06 NOTE — ED Notes (Signed)
Delay in zosyn and vanc administration due to pt going to MRI, will administer when pt returns

## 2017-06-07 ENCOUNTER — Inpatient Hospital Stay (HOSPITAL_COMMUNITY): Payer: Self-pay | Admitting: Anesthesiology

## 2017-06-07 ENCOUNTER — Encounter (HOSPITAL_COMMUNITY): Payer: Self-pay | Admitting: General Practice

## 2017-06-07 ENCOUNTER — Encounter (HOSPITAL_COMMUNITY)
Admission: EM | Disposition: A | Discharge status: Home / Self Care | Payer: Self-pay | Source: Home / Self Care | Attending: Internal Medicine

## 2017-06-07 ENCOUNTER — Other Ambulatory Visit: Payer: Self-pay

## 2017-06-07 ENCOUNTER — Inpatient Hospital Stay (HOSPITAL_COMMUNITY): Payer: Self-pay

## 2017-06-07 ENCOUNTER — Other Ambulatory Visit (HOSPITAL_COMMUNITY): Payer: Self-pay

## 2017-06-07 HISTORY — PX: APPLICATION OF WOUND VAC: SHX5189

## 2017-06-07 HISTORY — PX: STERNAL WOUND DEBRIDEMENT: SHX1058

## 2017-06-07 LAB — HEPATITIS PANEL, ACUTE
HCV Ab: >11 s/co ratio — ABNORMAL HIGH (ref 0.0–0.9)
Hep A IgM: NEGATIVE
Hep B C IgM: NEGATIVE
Hepatitis B Surface Ag: NEGATIVE

## 2017-06-07 LAB — PROTIME-INR
INR: 1.11
Prothrombin Time: 14.2 seconds (ref 11.4–15.2)

## 2017-06-07 LAB — CBC
HCT: 33.1 % — ABNORMAL LOW (ref 39.0–52.0)
Hemoglobin: 11.2 g/dL — ABNORMAL LOW (ref 13.0–17.0)
MCH: 30.4 pg (ref 26.0–34.0)
MCHC: 33.8 g/dL (ref 30.0–36.0)
MCV: 89.9 fL (ref 78.0–100.0)
Platelets: 334 10*3/uL (ref 150–400)
RBC: 3.68 MIL/uL — ABNORMAL LOW (ref 4.22–5.81)
RDW: 14.7 % (ref 11.5–15.5)
WBC: 18 10*3/uL — ABNORMAL HIGH (ref 4.0–10.5)

## 2017-06-07 LAB — EXPECTORATED SPUTUM ASSESSMENT W GRAM STAIN, RFLX TO RESP C

## 2017-06-07 LAB — COMPREHENSIVE METABOLIC PANEL
ALT: 39 U/L (ref 17–63)
AST: 41 U/L (ref 15–41)
Albumin: 2.1 g/dL — ABNORMAL LOW (ref 3.5–5.0)
Alkaline Phosphatase: 185 U/L — ABNORMAL HIGH (ref 38–126)
Anion gap: 10 (ref 5–15)
BUN: 6 mg/dL (ref 6–20)
CO2: 27 mmol/L (ref 22–32)
Calcium: 8.2 mg/dL — ABNORMAL LOW (ref 8.9–10.3)
Chloride: 97 mmol/L — ABNORMAL LOW (ref 101–111)
Creatinine, Ser: 0.64 mg/dL (ref 0.61–1.24)
GFR calc Af Amer: >60 mL/min (ref >60)
GFR calc non Af Amer: >60 mL/min (ref >60)
Glucose, Bld: 123 mg/dL — ABNORMAL HIGH (ref 65–99)
Potassium: 3.6 mmol/L (ref 3.5–5.1)
Sodium: 134 mmol/L — ABNORMAL LOW (ref 135–145)
Total Bilirubin: 0.3 mg/dL (ref 0.3–1.2)
Total Protein: 5.6 g/dL — ABNORMAL LOW (ref 6.5–8.1)

## 2017-06-07 LAB — SURGICAL PCR SCREEN
MRSA, PCR: NEGATIVE
Staphylococcus aureus: POSITIVE — AB

## 2017-06-07 LAB — BLOOD GAS, ARTERIAL
ACID-BASE EXCESS: 3.5 mmol/L — AB (ref 0.0–2.0)
BICARBONATE: 27.5 mmol/L (ref 20.0–28.0)
Drawn by: 313061
FIO2: 21
O2 SAT: 90.2 %
PH ART: 7.43 (ref 7.350–7.450)
Patient temperature: 98.6
pCO2 arterial: 42.2 mmHg (ref 32.0–48.0)
pO2, Arterial: 60.2 mmHg — ABNORMAL LOW (ref 83.0–108.0)

## 2017-06-07 LAB — APTT: aPTT: 39 seconds — ABNORMAL HIGH (ref 24–36)

## 2017-06-07 LAB — LEGIONELLA PNEUMOPHILA SEROGP 1 UR AG: L. PNEUMOPHILA SEROGP 1 UR AG: NEGATIVE

## 2017-06-07 LAB — PREPARE RBC (CROSSMATCH)

## 2017-06-07 LAB — EXPECTORATED SPUTUM ASSESSMENT W REFEX TO RESP CULTURE

## 2017-06-07 LAB — ABO/RH: ABO/RH(D): O NEG

## 2017-06-07 SURGERY — DEBRIDEMENT, WOUND, STERNUM
Anesthesia: General | Laterality: Right

## 2017-06-07 MED ORDER — MUPIROCIN 2 % EX OINT
1.0000 "application " | TOPICAL_OINTMENT | Freq: Two times a day (BID) | CUTANEOUS | Status: AC
  Administered 2017-06-07 – 2017-06-11 (×10): 1 via NASAL
  Filled 2017-06-07 (×2): qty 22

## 2017-06-07 MED ORDER — SENNOSIDES-DOCUSATE SODIUM 8.6-50 MG PO TABS
1.0000 | ORAL_TABLET | Freq: Every day | ORAL | Status: DC
  Filled 2017-06-07: qty 1

## 2017-06-07 MED ORDER — PROPOFOL 10 MG/ML IV BOLUS
INTRAVENOUS | Status: AC
  Filled 2017-06-07: qty 20

## 2017-06-07 MED ORDER — BISACODYL 5 MG PO TBEC
10.0000 mg | DELAYED_RELEASE_TABLET | Freq: Every day | ORAL | Status: DC
  Administered 2017-06-07 – 2017-06-08 (×2): 10 mg via ORAL
  Filled 2017-06-07 (×2): qty 2

## 2017-06-07 MED ORDER — VANCOMYCIN HCL 1000 MG IV SOLR
INTRAVENOUS | Status: AC
  Administered 2017-06-07: 1000 mL
  Filled 2017-06-07: qty 1000

## 2017-06-07 MED ORDER — SUGAMMADEX SODIUM 200 MG/2ML IV SOLN
INTRAVENOUS | Status: DC | PRN
  Administered 2017-06-07: 200 mg via INTRAVENOUS

## 2017-06-07 MED ORDER — MIDAZOLAM HCL 2 MG/2ML IJ SOLN
INTRAMUSCULAR | Status: AC
  Filled 2017-06-07: qty 2

## 2017-06-07 MED ORDER — OXYCODONE HCL 5 MG/5ML PO SOLN: 5.0000 mg | Freq: Once | ORAL | Status: DC | PRN

## 2017-06-07 MED ORDER — DEXMEDETOMIDINE HCL IN NACL 200 MCG/50ML IV SOLN
INTRAVENOUS | Status: DC | PRN
Start: 2017-06-07 — End: 2017-06-07
  Administered 2017-06-07 (×4): 8 ug via INTRAVENOUS
  Administered 2017-06-07 (×2): 4 ug via INTRAVENOUS

## 2017-06-07 MED ORDER — PROPOFOL 10 MG/ML IV BOLUS
INTRAVENOUS | Status: DC | PRN
  Administered 2017-06-07: 200 mg via INTRAVENOUS

## 2017-06-07 MED ORDER — DEXTROSE 5 % IV SOLN
1.5000 g | INTRAVENOUS | Status: AC
  Administered 2017-06-07: 1.5 g via INTRAVENOUS
  Filled 2017-06-07: qty 1.5

## 2017-06-07 MED ORDER — ROCURONIUM BROMIDE 10 MG/ML (PF) SYRINGE
PREFILLED_SYRINGE | INTRAVENOUS | Status: DC | PRN
  Administered 2017-06-07: 40 mg via INTRAVENOUS
  Administered 2017-06-07: 30 mg via INTRAVENOUS

## 2017-06-07 MED ORDER — FENTANYL CITRATE (PF) 250 MCG/5ML IJ SOLN
INTRAMUSCULAR | Status: AC
  Filled 2017-06-07: qty 5

## 2017-06-07 MED ORDER — FENTANYL CITRATE (PF) 100 MCG/2ML IJ SOLN
25.0000 ug | INTRAMUSCULAR | Status: DC | PRN
  Administered 2017-06-10 – 2017-06-13 (×24): 50 ug via INTRAVENOUS
  Filled 2017-06-07 (×24): qty 2

## 2017-06-07 MED ORDER — DEXAMETHASONE SODIUM PHOSPHATE 10 MG/ML IJ SOLN
INTRAMUSCULAR | Status: AC
  Filled 2017-06-07: qty 1

## 2017-06-07 MED ORDER — SODIUM CHLORIDE 0.9 % IR SOLN
Status: DC | PRN
  Administered 2017-06-07: 1000 mL

## 2017-06-07 MED ORDER — PNEUMOCOCCAL VAC POLYVALENT 25 MCG/0.5ML IJ INJ
0.5000 mL | INJECTION | INTRAMUSCULAR | Status: AC
  Administered 2017-06-08: 0.5 mL via INTRAMUSCULAR
  Filled 2017-06-07: qty 0.5

## 2017-06-07 MED ORDER — ONDANSETRON HCL 4 MG/2ML IJ SOLN
INTRAMUSCULAR | Status: DC | PRN
  Administered 2017-06-07: 4 mg via INTRAVENOUS

## 2017-06-07 MED ORDER — HYDROMORPHONE HCL 1 MG/ML IJ SOLN
INTRAMUSCULAR | Status: AC
  Administered 2017-06-07: 0.5 mg via INTRAVENOUS
  Filled 2017-06-07: qty 1

## 2017-06-07 MED ORDER — HYDROMORPHONE HCL 1 MG/ML IJ SOLN
0.2500 mg | INTRAMUSCULAR | Status: DC | PRN
  Administered 2017-06-07 (×4): 0.5 mg via INTRAVENOUS

## 2017-06-07 MED ORDER — ACETAMINOPHEN 160 MG/5ML PO SOLN
1000.0000 mg | Freq: Four times a day (QID) | ORAL | Status: AC
  Filled 2017-06-07: qty 40.6

## 2017-06-07 MED ORDER — SODIUM CHLORIDE 0.9 % IV SOLN: 10.0000 mL/h | Freq: Once | INTRAVENOUS | Status: DC

## 2017-06-07 MED ORDER — MIDAZOLAM HCL 2 MG/2ML IJ SOLN
INTRAMUSCULAR | Status: DC | PRN
  Administered 2017-06-07: 2 mg via INTRAVENOUS

## 2017-06-07 MED ORDER — OXYCODONE HCL 5 MG PO TABS: 5.0000 mg | ORAL_TABLET | Freq: Once | ORAL | Status: DC | PRN

## 2017-06-07 MED ORDER — OXYCODONE HCL 5 MG PO TABS
5.0000 mg | ORAL_TABLET | ORAL | Status: DC | PRN
  Administered 2017-06-07 – 2017-06-08 (×2): 10 mg via ORAL
  Administered 2017-06-08: 5 mg via ORAL
  Administered 2017-06-08 (×2): 10 mg via ORAL
  Administered 2017-06-08: 5 mg via ORAL
  Administered 2017-06-09 – 2017-06-10 (×7): 10 mg via ORAL
  Filled 2017-06-07 (×2): qty 2
  Filled 2017-06-07: qty 1
  Filled 2017-06-07: qty 2
  Filled 2017-06-07: qty 1
  Filled 2017-06-07 (×10): qty 2

## 2017-06-07 MED ORDER — HYDROMORPHONE HCL 1 MG/ML IJ SOLN
1.0000 mg | INTRAMUSCULAR | Status: DC | PRN
Start: 2017-06-07 — End: 2017-06-11
  Administered 2017-06-07 – 2017-06-11 (×20): 1 mg via INTRAVENOUS
  Filled 2017-06-07 (×20): qty 1

## 2017-06-07 MED ORDER — LIDOCAINE 2% (20 MG/ML) 5 ML SYRINGE
INTRAMUSCULAR | Status: DC | PRN
  Administered 2017-06-07: 100 mg via INTRAVENOUS

## 2017-06-07 MED ORDER — CHLORHEXIDINE GLUCONATE CLOTH 2 % EX PADS
6.0000 | MEDICATED_PAD | Freq: Every day | CUTANEOUS | Status: AC
  Administered 2017-06-07 – 2017-06-11 (×5): 6 via TOPICAL

## 2017-06-07 MED ORDER — POTASSIUM CHLORIDE 10 MEQ/50ML IV SOLN: 10.0000 meq | Freq: Every day | INTRAVENOUS | Status: DC | PRN

## 2017-06-07 MED ORDER — FENTANYL CITRATE (PF) 250 MCG/5ML IJ SOLN
INTRAMUSCULAR | Status: DC | PRN
  Administered 2017-06-07: 50 ug via INTRAVENOUS
  Administered 2017-06-07: 150 ug via INTRAVENOUS
  Administered 2017-06-07: 50 ug via INTRAVENOUS

## 2017-06-07 MED ORDER — PROMETHAZINE HCL 25 MG/ML IJ SOLN: 6.2500 mg | INTRAMUSCULAR | Status: DC | PRN

## 2017-06-07 MED ORDER — ROCURONIUM BROMIDE 10 MG/ML (PF) SYRINGE
PREFILLED_SYRINGE | INTRAVENOUS | Status: AC
  Filled 2017-06-07: qty 5

## 2017-06-07 MED ORDER — ZOLPIDEM TARTRATE 5 MG PO TABS
5.0000 mg | ORAL_TABLET | Freq: Every evening | ORAL | Status: DC | PRN
  Administered 2017-06-07 – 2017-06-10 (×4): 5 mg via ORAL
  Filled 2017-06-07 (×4): qty 1

## 2017-06-07 MED ORDER — SUGAMMADEX SODIUM 200 MG/2ML IV SOLN
INTRAVENOUS | Status: AC
  Filled 2017-06-07: qty 2

## 2017-06-07 MED ORDER — ACETAMINOPHEN 500 MG PO TABS
1000.0000 mg | ORAL_TABLET | Freq: Four times a day (QID) | ORAL | Status: AC
  Administered 2017-06-07 – 2017-06-12 (×7): 1000 mg via ORAL
  Filled 2017-06-07 (×8): qty 2

## 2017-06-07 MED ORDER — LACTATED RINGERS IV SOLN
INTRAVENOUS | Status: DC
  Administered 2017-06-07: 14:00:00 via INTRAVENOUS

## 2017-06-07 MED ORDER — HYDROMORPHONE HCL 1 MG/ML IJ SOLN
0.2500 mg | INTRAMUSCULAR | Status: AC | PRN
  Administered 2017-06-07 (×4): 0.5 mg via INTRAVENOUS

## 2017-06-07 MED ORDER — DEXAMETHASONE SODIUM PHOSPHATE 10 MG/ML IJ SOLN
INTRAMUSCULAR | Status: DC | PRN
  Administered 2017-06-07: 10 mg via INTRAVENOUS

## 2017-06-07 MED ORDER — PHENYLEPHRINE 40 MCG/ML (10ML) SYRINGE FOR IV PUSH (FOR BLOOD PRESSURE SUPPORT)
PREFILLED_SYRINGE | INTRAVENOUS | Status: AC
  Filled 2017-06-07: qty 10

## 2017-06-07 MED ORDER — KETOROLAC TROMETHAMINE 30 MG/ML IJ SOLN
INTRAMUSCULAR | Status: AC
  Filled 2017-06-07: qty 1

## 2017-06-07 MED ORDER — LIDOCAINE 2% (20 MG/ML) 5 ML SYRINGE
INTRAMUSCULAR | Status: AC
  Filled 2017-06-07: qty 5

## 2017-06-07 MED ORDER — DEXTROSE-NACL 5-0.45 % IV SOLN
INTRAVENOUS | Status: DC
  Administered 2017-06-07: 20:00:00 via INTRAVENOUS

## 2017-06-07 MED ORDER — ONDANSETRON HCL 4 MG/2ML IJ SOLN
INTRAMUSCULAR | Status: AC
  Filled 2017-06-07: qty 2

## 2017-06-07 MED ORDER — DIPHENHYDRAMINE HCL 25 MG PO CAPS
25.0000 mg | ORAL_CAPSULE | Freq: Four times a day (QID) | ORAL | Status: DC | PRN
  Administered 2017-06-08 – 2017-06-15 (×9): 25 mg via ORAL
  Filled 2017-06-07 (×8): qty 1

## 2017-06-07 MED ORDER — EPHEDRINE 5 MG/ML INJ
INTRAVENOUS | Status: AC
  Filled 2017-06-07: qty 10

## 2017-06-07 MED ORDER — SODIUM CHLORIDE 0.9 % IR SOLN
Status: DC | PRN
  Administered 2017-06-07: 3000 mL

## 2017-06-07 SURGICAL SUPPLY — 74 items
ATTRACTOMAT 16X20 MAGNETIC DRP (DRAPES) IMPLANT
BAG DECANTER FOR FLEXI CONT (MISCELLANEOUS) ×3 IMPLANT
BENZOIN TINCTURE PRP APPL 2/3 (GAUZE/BANDAGES/DRESSINGS) IMPLANT
BLADE STERNUM SYSTEM 6 (BLADE) ×3 IMPLANT
BLADE SURG 10 STRL SS (BLADE) IMPLANT
BLADE SURG 15 STRL LF DISP TIS (BLADE) IMPLANT
BLADE SURG 15 STRL SS (BLADE)
BNDG GAUZE ELAST 4 BULKY (GAUZE/BANDAGES/DRESSINGS) IMPLANT
CANISTER SUCT 3000ML PPV (MISCELLANEOUS) ×3 IMPLANT
CANISTER WOUNDNEG PRESSURE 500 (CANNISTER) ×3 IMPLANT
CATH FOLEY 2WAY SLVR  5CC 16FR (CATHETERS)
CATH FOLEY 2WAY SLVR 5CC 16FR (CATHETERS) IMPLANT
CATH THORACIC 28FR RT ANG (CATHETERS) IMPLANT
CATH THORACIC 36FR (CATHETERS) IMPLANT
CATH THORACIC 36FR RT ANG (CATHETERS) IMPLANT
CLIP VESOCCLUDE SM WIDE 24/CT (CLIP) IMPLANT
CONN Y 3/8X3/8X3/8  BEN (MISCELLANEOUS)
CONN Y 3/8X3/8X3/8 BEN (MISCELLANEOUS) IMPLANT
CONT SPEC 4OZ CLIKSEAL STRL BL (MISCELLANEOUS) ×3 IMPLANT
COVER SURGICAL LIGHT HANDLE (MISCELLANEOUS) ×3 IMPLANT
DRAPE LAPAROSCOPIC ABDOMINAL (DRAPES) ×3 IMPLANT
DRAPE SLUSH/WARMER DISC (DRAPES) IMPLANT
DRAPE WARM FLUID 44X44 (DRAPE) IMPLANT
DRSG AQUACEL AG ADV 3.5X14 (GAUZE/BANDAGES/DRESSINGS) IMPLANT
DRSG PAD ABDOMINAL 8X10 ST (GAUZE/BANDAGES/DRESSINGS) IMPLANT
DRSG VAC ATS SM SENSATRAC (GAUZE/BANDAGES/DRESSINGS) ×3 IMPLANT
DRSG VERSA FOAM LRG 10X15 (GAUZE/BANDAGES/DRESSINGS) ×3 IMPLANT
ELECT BLADE 4.0 EZ CLEAN MEGAD (MISCELLANEOUS) ×3
ELECT REM PT RETURN 9FT ADLT (ELECTROSURGICAL) ×3
ELECTRODE BLDE 4.0 EZ CLN MEGD (MISCELLANEOUS) ×1 IMPLANT
ELECTRODE REM PT RTRN 9FT ADLT (ELECTROSURGICAL) ×1 IMPLANT
GAUZE SPONGE 4X4 12PLY STRL (GAUZE/BANDAGES/DRESSINGS) IMPLANT
GAUZE XEROFORM 5X9 LF (GAUZE/BANDAGES/DRESSINGS) IMPLANT
GLOVE BIO SURGEON STRL SZ 6 (GLOVE) ×3 IMPLANT
GLOVE BIO SURGEON STRL SZ 6.5 (GLOVE) ×2 IMPLANT
GLOVE BIO SURGEON STRL SZ7.5 (GLOVE) ×6 IMPLANT
GLOVE BIO SURGEON STRL SZ8 (GLOVE) ×6 IMPLANT
GLOVE BIO SURGEON STRL SZ8.5 (GLOVE) ×6 IMPLANT
GLOVE BIO SURGEONS STRL SZ 6.5 (GLOVE) ×1
GLOVE BIOGEL PI IND STRL 8.5 (GLOVE) ×1 IMPLANT
GLOVE BIOGEL PI INDICATOR 8.5 (GLOVE) ×2
GLOVE ECLIPSE 6.5 STRL STRAW (GLOVE) ×3 IMPLANT
GOWN STRL REUS W/ TWL LRG LVL3 (GOWN DISPOSABLE) ×4 IMPLANT
GOWN STRL REUS W/TWL 2XL LVL3 (GOWN DISPOSABLE) ×3 IMPLANT
GOWN STRL REUS W/TWL LRG LVL3 (GOWN DISPOSABLE) ×8
HANDPIECE INTERPULSE COAX TIP (DISPOSABLE) ×2
HEMOSTAT POWDER SURGIFOAM 1G (HEMOSTASIS) IMPLANT
HEMOSTAT SURGICEL 2X14 (HEMOSTASIS) IMPLANT
KIT BASIN OR (CUSTOM PROCEDURE TRAY) ×3 IMPLANT
KIT ROOM TURNOVER OR (KITS) ×3 IMPLANT
KIT SUCTION CATH 14FR (SUCTIONS) IMPLANT
NS IRRIG 1000ML POUR BTL (IV SOLUTION) ×3 IMPLANT
PACK CHEST (CUSTOM PROCEDURE TRAY) IMPLANT
PAD ARMBOARD 7.5X6 YLW CONV (MISCELLANEOUS) ×6 IMPLANT
SET HNDPC FAN SPRY TIP SCT (DISPOSABLE) ×1 IMPLANT
SOL PREP POV-IOD 4OZ 10% (MISCELLANEOUS) IMPLANT
SPONGE LAP 18X18 X RAY DECT (DISPOSABLE) IMPLANT
STAPLER VISISTAT 35W (STAPLE) IMPLANT
STRAP MONTGOMERY 1.25X11-1/8 (MISCELLANEOUS) IMPLANT
SUT ETHILON 3 0 FSL (SUTURE) IMPLANT
SUT STEEL 6MS V (SUTURE) IMPLANT
SUT STEEL STERNAL CCS#1 18IN (SUTURE) IMPLANT
SUT STEEL SZ 6 DBL 3X14 BALL (SUTURE) IMPLANT
SUT VIC AB 1 CTX 36 (SUTURE) ×4
SUT VIC AB 1 CTX36XBRD ANBCTR (SUTURE) ×2 IMPLANT
SUT VIC AB 2-0 CTX 27 (SUTURE) IMPLANT
SUT VIC AB 3-0 X1 27 (SUTURE) IMPLANT
SWAB COLLECTION DEVICE MRSA (MISCELLANEOUS) ×3 IMPLANT
SWAB CULTURE ESWAB REG 1ML (MISCELLANEOUS) ×3 IMPLANT
SYR 5ML LL (SYRINGE) IMPLANT
TOWEL GREEN STERILE (TOWEL DISPOSABLE) ×3 IMPLANT
TOWEL GREEN STERILE FF (TOWEL DISPOSABLE) IMPLANT
TRAY FOLEY W/METER SILVER 16FR (SET/KITS/TRAYS/PACK) IMPLANT
WATER STERILE IRR 1000ML POUR (IV SOLUTION) ×3 IMPLANT

## 2017-06-07 NOTE — Progress Notes (Signed)
[facility]                                                                              Patient Demographics  Clifford Tucker, is a 37 y.o. male, DOB - 12/11/1980, ZOX:[id number]  Admit date - 06/05/2017   Admitting Physician Briscoe Deutscher, MD  Outpatient Primary MD for the patient is Patient, No Pcp Per  Outpatient specialists:   LOS - 2  days   Medical records reviewed and are as summarized below:    Chief Complaint  Patient presents with  . Oral Swelling       Brief summary   Clifford Tucker is a 37 y.o. malewith medical history significant forIV drug abuse and recurrent skin and soft tissue infections. He presented with neck/hip pain found to have a neck abscess, septic arthritis of the hip and now MSSA bacteremia. CT surgery, orthopedic surgery and infectious disease consulted.   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  Reported having history of multiple abscesses over the last few weeks -Patient was originally placed on vancomycin and Zosyn, transition to IV Ancef -Follow 2D echo, ID following -Follow cultures from hip aspirate and sternal aspirate today -Continue pain control  Active Problems:   Multifocal pneumonia -ID following, continue cefazolin  Left hip abscess/iliopsoas septic arthritis, infectious myositis -Status post left hip I&D with cultures done on 1/15, follow cultures - Continue IV Ancef for now, ID following  Septic arthritis of the sternoclavicular joint, abscess -CT VS following, plan for debridement with wound VAC today    IV drug user -Per patient, has been using heroin, [facility] but relapsed a month ago  -Last heroin use a week ago, follow closely for any withdrawals -Social work  consult for possible rehab at discharge   Code Status: Full code DVT Prophylaxis: Lovenox Family Communication: Discussed in detail with the patient, all imaging results, lab results explained to the patient and girlfriend in the room   Disposition Plan:   Time Spent in minutes   35 minutes  Procedures:  I&D left hip 1/15  Consultants:   Orthopedics CT VS ID  Antimicrobials:      Medications  Scheduled Meds: . [MAR Hold] Chlorhexidine Gluconate Cloth  6 each Topical Daily  . [MAR Hold] enoxaparin (LOVENOX) injection  40 mg Subcutaneous Q24H  . [MAR Hold] iopamidol  20 mL Intra-articular Once  . [MAR Hold] lidocaine (PF)  5 mL Intradermal Once  . [MAR Hold] mupirocin ointment  1 application Nasal BID  . [MAR Hold] pneumococcal 23 valent vaccine  0.5 mL Intramuscular Tomorrow-1000  . [MAR Hold] sodium chloride  10 mL Intravenous Once  . vancomycin 1000 mg in NS (1000 ml) irrigation for Dr. Cornelius Moras case   Irrigation To OR   Continuous Infusions: . 0.9 % NaCl with KCl 20 mEq / L 75 mL/hr at 06/07/17 1153  . [facility] Hold]  ceFAZolin (ANCEF) IV Stopped (06/07/17 1103)  . cefUROXime (ZINACEF)  IV    . lactated ringers 10 mL/hr at 06/06/17 1406  . lactated ringers 10 mL/hr at 06/07/17 1354   PRN Meds:.[MAR Hold] acetaminophen **OR** [MAR Hold] acetaminophen, [MAR Hold] diphenhydrAMINE, [MAR Hold]  HYDROmorphone (DILAUDID) injection, [MAR Hold] ketorolac, [MAR Hold] metoCLOPramide **OR** [MAR Hold] metoCLOPramide (REGLAN) injection, [MAR Hold] ondansetron **OR** [MAR Hold] ondansetron (ZOFRAN) IV, [MAR Hold] senna-docusate, [MAR Hold] zolpidem   Antibiotics   Anti-infectives (From admission, onward)   Start     Dose/Rate Route Frequency Ordered Stop   06/07/17 1400  cefUROXime (ZINACEF) 1.5 g in dextrose 5 % 50 mL IVPB     1.5 g 100 mL/hr over 30 Minutes Intravenous To [facility] 06/07/17 0125 06/08/17 1400   06/07/17 0745  vancomycin (VANCOCIN) 1,000 mg in sodium  chloride 0.9 % 1,000 mL irrigation      Irrigation To Surgery 06/07/17 0744 06/08/17 0745   06/06/17 1639  gentamicin (GARAMYCIN) injection  Status:  Discontinued       As needed 06/06/17 1640 06/06/17 1715   06/06/17 1638  vancomycin (VANCOCIN) powder  Status:  Discontinued       As needed 06/06/17 1639 06/06/17 1715   06/06/17 1000  [MAR Hold]  ceFAZolin (ANCEF) IVPB 2g/100 mL premix     (MAR Hold since 06/07/17 1339)   2 g 200 mL/hr over 30 Minutes Intravenous Every 8 hours 06/06/17 0911     06/06/17 0000  vancomycin (VANCOCIN) IVPB 1000 mg/200 mL premix  Status:  Discontinued     1,000 mg 200 mL/hr over 60 Minutes Intravenous Every 8 hours 06/05/17 2211 06/06/17 0911   06/05/17 2330  piperacillin-tazobactam (ZOSYN) IVPB 3.375 g  Status:  Discontinued     3.375 g 12.5 mL/hr over 240 Minutes Intravenous Every 8 hours 06/05/17 2211 06/06/17 0911   06/05/17 2200  cefTRIAXone (ROCEPHIN) 1 g in dextrose 5 % 50 mL IVPB  Status:  Discontinued     1 g 100 mL/hr over 30 Minutes Intravenous Every 24 hours 06/05/17 2140 06/05/17 2202   06/05/17 2200  azithromycin (ZITHROMAX) 500 mg in dextrose 5 % 250 mL IVPB  Status:  Discontinued     500 mg 250 mL/hr over 60 Minutes Intravenous Every 24 hours 06/05/17 2140 06/06/17 0911   06/05/17 1600  vancomycin (VANCOCIN) 1,500 mg in sodium chloride 0.9 % 500 mL IVPB     1,500 mg 250 mL/hr over 120 Minutes Intravenous  Once 06/05/17 1542 06/05/17 1855   06/05/17 1515  piperacillin-tazobactam (ZOSYN) IVPB 3.375 g     3.375 g 100 mL/hr over 30 Minutes Intravenous  Once 06/05/17 1512 06/05/17 1557        Subjective:   Clifford Tucker was seen and examined today.  Complaining of not been able to sleep, has not slept for days.  States pain is not well controlled. Patient denies dizziness, chest pain, shortness of breath, abdominal pain, N/V/D/C, new weakness, numbess, tingling. No acute events overnight.    Objective:   Vitals:   06/06/17 1810  06/06/17 1822 06/06/17 2129 06/07/17 0530  BP:  113/70 119/69 127/70  Pulse: 94 89 96 92  Resp: 11 16 18 18   Temp: 97.9 F (36.6 C) 97.8 F (36.6 C) 98.4 F (36.9 C) 99.5 F (37.5 C)  TempSrc:  Oral Oral Oral  SpO2: 92% 97% 96% 99%  Weight:      Height:        Intake/Output Summary (  Last 24 hours) at 06/07/2017 1358 Last data filed at 06/07/2017 1103 Gross per 24 hour  Intake 2626.25 ml  Output 688 ml  Net 1938.25 ml     Wt Readings from Last 3 Encounters:  06/06/17 70.3 kg (154 lb 15.7 oz)  10/03/12 72.6 kg (160 lb)     Exam  General: Alert and oriented x 3, NAD  Eyes:   HEENT:  Atraumatic, normocephalic,  erythematous abscess area on the right lower neck  Cardiovascular: S1 S2 auscultated, RRR, no murmurs auscultated  Respiratory: Clear to auscultation bilaterally, no wheezing, rales or rhonchi  Gastrointestinal: Soft, nontender, nondistended, + bowel sounds  Ext: no pedal edema bilaterally  Neuro: no new deficits  Musculoskeletal: No digital cyanosis, clubbing  Skin: No rashes  Psych: Normal affect and demeanor, alert and oriented x3    Data Reviewed:  I have personally reviewed following labs and imaging studies  Micro Results Recent Results (from the past 240 hour(s))  Blood culture (routine x 2)     Status: Abnormal (Preliminary result)   Collection Time: 06/05/17  2:44 PM  Result Value Ref Range Status   Specimen Description RIGHT ANTECUBITAL  Final   Special Requests   Final    BOTTLES DRAWN AEROBIC ONLY Blood Culture adequate volume   Culture  Setup Time   Final    GRAM POSITIVE COCCI Gram Stain Report Called to,Read Back By and Verified With: ROWE,C AT 0730 BY HUFFINES,S ON 06/06/17. AEROBIC BOTTLE ONLY Performed at [facility], [address], [place], [place] [address]    Culture STAPHYLOCOCCUS AUREUS (A)  Final   Report Status PENDING  Incomplete  Blood culture (routine x 2)     Status: Abnormal (Preliminary result)    Collection Time: 06/05/17  2:47 PM  Result Value Ref Range Status   Specimen Description LEFT ANTECUBITAL  Final   Special Requests   Final    BOTTLES DRAWN AEROBIC AND ANAEROBIC Blood Culture adequate volume   Culture  Setup Time   Final    GRAM POSITIVE COCCI BOTH AEB AND ANA Gram Stain Report Called to,Read Back By and Verified With: J.CRUISE, RN @ [facility] ED @ 0545 ON 1.15.19 BY BOWMAN,L CRITICAL RESULT CALLED TO, READ BACK BY AND VERIFIED WITH: N. Batchelder Pharm.D. 8:55 06/06/17 ([email or web address]) Performed at [facility], [address] [place], [place] [address]    Culture STAPHYLOCOCCUS AUREUS (A)  Final   Report Status PENDING  Incomplete  Blood Culture ID Panel (Reflexed)     Status: Abnormal   Collection Time: 06/05/17  2:47 PM  Result Value Ref Range Status   Enterococcus species NOT DETECTED NOT DETECTED Final   Listeria monocytogenes NOT DETECTED NOT DETECTED Final   Staphylococcus species DETECTED (A) NOT DETECTED Final    Comment: CRITICAL RESULT CALLED TO, READ BACK BY AND VERIFIED WITH: N. Batchelder Pharm.D. 8:55 06/06/17 ([email or web address])    Staphylococcus aureus DETECTED (A) NOT DETECTED Final    Comment: Methicillin (oxacillin) susceptible Staphylococcus aureus (MSSA). Preferred therapy is anti staphylococcal beta lactam antibiotic (Cefazolin or Nafcillin), unless clinically contraindicated. CRITICAL RESULT CALLED TO, READ BACK BY AND VERIFIED WITH: N. Batchelder Pharm.D. 8:55 06/06/17 ([email or web address])    Methicillin resistance NOT DETECTED NOT DETECTED Final   Streptococcus species NOT DETECTED NOT DETECTED Final   Streptococcus agalactiae NOT DETECTED NOT DETECTED Final   Streptococcus pneumoniae NOT DETECTED NOT DETECTED Final   Streptococcus pyogenes NOT DETECTED NOT DETECTED Final   Acinetobacter baumannii NOT DETECTED  NOT DETECTED Final   Enterobacteriaceae species NOT DETECTED NOT DETECTED Final   Enterobacter cloacae complex NOT DETECTED NOT DETECTED Final   Escherichia  coli NOT DETECTED NOT DETECTED Final   Klebsiella oxytoca NOT DETECTED NOT DETECTED Final   Klebsiella pneumoniae NOT DETECTED NOT DETECTED Final   Proteus species NOT DETECTED NOT DETECTED Final   Serratia marcescens NOT DETECTED NOT DETECTED Final   Haemophilus influenzae NOT DETECTED NOT DETECTED Final   Neisseria meningitidis NOT DETECTED NOT DETECTED Final   Pseudomonas aeruginosa NOT DETECTED NOT DETECTED Final   Candida albicans NOT DETECTED NOT DETECTED Final   Candida glabrata NOT DETECTED NOT DETECTED Final   Candida krusei NOT DETECTED NOT DETECTED Final   Candida parapsilosis NOT DETECTED NOT DETECTED Final   Candida tropicalis NOT DETECTED NOT DETECTED Final  Aerobic/Anaerobic Culture (surgical/deep wound)     Status: None (Preliminary result)   Collection Time: 06/06/17  4:09 PM  Result Value Ref Range Status   Specimen Description WOUND LEFT HIP  Final   Special Requests PT ON VANC ANCEF ROCEPHIN ZOSYN  Final   Gram Stain   Final    NO WBC SEEN NO SQUAMOUS EPITHELIAL CELLS SEEN NO ORGANISMS SEEN    Culture   Final    RARE STAPHYLOCOCCUS AUREUS CULTURE REINCUBATED FOR BETTER GROWTH    Report Status PENDING  Incomplete  Surgical pcr screen     Status: Abnormal   Collection Time: 06/06/17  8:38 PM  Result Value Ref Range Status   MRSA, PCR NEGATIVE NEGATIVE Final   Staphylococcus aureus POSITIVE (A) NEGATIVE Final    Comment: (NOTE) The Xpert SA Assay (FDA approved for NASAL specimens in patients 49 years of age and older), is one component of a comprehensive surveillance program. It is not intended to diagnose infection nor to guide or monitor treatment.     Radiology Reports Dg Chest 2 View  Result Date: 06/05/2017 CLINICAL DATA:  Shortness of Breath EXAM: CHEST  2 VIEW COMPARISON:  December 01, 2011 FINDINGS: There is patchy infiltrate in the lateral right base. Lungs elsewhere are clear. Heart size and pulmonary vascularity are normal. No adenopathy. No bone  lesions. IMPRESSION: Infiltrate consistent with pneumonia lateral right base. Lungs elsewhere clear. Cardiac silhouette within normal limits. Electronically Signed   By: Bretta Bang III M.D.   On: 06/05/2017 14:47   Ct Soft Tissue Neck W Contrast  Result Date: 06/05/2017 CLINICAL DATA:  Neck mass.  Hard, red, raised area in the neck. EXAM: CT NECK WITH CONTRAST TECHNIQUE: Multidetector CT imaging of the neck was performed using the standard protocol following the bolus administration of intravenous contrast. CONTRAST:  ISOVUE-300 IOPAMIDOL (ISOVUE-300) INJECTION 61% COMPARISON:  None. FINDINGS: Pharynx and larynx: No pharyngeal or laryngeal mass. Patent airway. No retropharyngeal fluid collection. Salivary glands: No inflammation, mass, or stone. Thyroid: Mass effect on the right thyroid lobe by the inflammatory process described below. No intrinsic thyroid abnormality identified. Lymph nodes: Subcentimeter but asymmetric anterior and posterior cervical lymph nodes throughout the right neck, likely reactive. Vascular: Major vascular structures of the neck are patent. The below described inflammatory process extends into the right carotid space in the lower neck. Limited intracranial: Unremarkable. Visualized orbits: Not imaged. Mastoids and visualized paranasal sinuses: Partially visualized polypoid mucosal thickening or small mucous retention cyst in the left maxillary sinus. Visualized mastoid air cells are clear. Skeleton: There is a heterogeneous gas and fluid collection along the medial aspect of the right sternocleidomastoid muscle in  the lower neck beginning near the clavicular head. This collection extends posteriorly and inferiorly into the anterior mediastinum, more fully evaluated on the separate chest CT. Phlegmon extends superiorly within/along the right sternocleidomastoid muscle in the lower and mid neck. Fluid extends superiorly in the right neck deep to the strap muscles and contains  a few locules of gas, however this fluid does not appear as organized as the collections in the lower neck and upper chest. Inflammation in the right neck extends just superior to the level of the thyroid cartilage. No erosive changes are seen at the right sternoclavicular joint. Upper chest: Reported separately. Other: None. IMPRESSION: Gas and fluid collections in the anterior upper chest as reported on separate chest CT, concerning for abscesses. Phlegmon and less organized gas and fluid extend superiorly in the anterior right neck with suspected myositis of the right sternocleidomastoid muscle. Electronically Signed   By: Sebastian AcheAllen  Grady M.D.   On: 06/05/2017 16:31   Ct Chest W Contrast  Result Date: 06/05/2017 CLINICAL DATA:  Patient with shortness of breath. Red raised area anterior lower neck. EXAM: CT CHEST WITH CONTRAST TECHNIQUE: Multidetector CT imaging of the chest was performed during intravenous contrast administration. CONTRAST:  150mL ISOVUE-300 IOPAMIDOL (ISOVUE-300) INJECTION 61% COMPARISON:  Chest CT 06/21/2005; neck CT same day. FINDINGS: Cardiovascular: Normal heart size. No pericardial effusion. Normal caliber thoracic aorta. Enlarged main pulmonary artery (3.3 cm) as can be seen with pulmonary arterial hypertension. Mediastinum/Nodes: 1.2 cm AP window lymph node (image 50; series 2). 7 mm right paratracheal lymph node (image 50; series 2). Normal appearance of the esophagus. No axillary adenopathy. Lungs/Pleura: Central airways are patent. Subpleural consolidation with adjacent tree-in-bud ground-glass nodularity within the right lower lobe (image 114; series 4). Within the right upper lobe there multiple areas of regional ground-glass attenuation. No pleural effusion or pneumothorax. Upper Abdomen: No acute process. Musculoskeletal: There is a large centrally necrotic peripherally enhancing mass within the lower neck which extends from the anterior aspect of the right sternal clavicular  joint/distal right sternocleidomastoid superiorly along the right aspect of the thyroid and inferiorly within the retrosternal location. The largest portion of this mass measures 4.7 x 4.1 cm. There are multiple small foci of gas within the mass. No definite osseous destruction demonstrated within the right sternoclavicular joint. IMPRESSION: 1. There is a large peripherally enhancing mass with central fluid and gas within the right lower neck extending from the anterior aspect of the right sternoclavicular location posteriorly along the retrosternal soft tissues. Findings are concerning for abscess. No definite osseous destruction at this time of the right sternoclavicular joint or adjacent costochondral joint however secondary infection of these joints is not excluded given the size of the suspected abscess. 2. Consolidation within the right lower lobe with right upper lobe ground-glass opacities favored to represent multifocal pneumonia. 3. Mediastinal and right hilar adenopathy likely reactive in etiology. Critical Value/emergent results were called by telephone at the time of interpretation on 06/05/2017 at 4:18 pm to Dr. Fayrene FearingJames, who verbally acknowledged these results. Electronically Signed   By: Annia Beltrew  Davis M.D.   On: 06/05/2017 16:29   Mr Hip Left W Wo Contrast  Result Date: 06/06/2017 CLINICAL DATA:  Severe left hip pain for the past few weeks. History of IV drug abuse and recurrent skin and soft tissue infections. EXAM: MRI OF THE LEFT HIP WITHOUT AND WITH CONTRAST TECHNIQUE: Multiplanar, multisequence MR imaging was performed both before and after administration of intravenous contrast. CONTRAST:  15mL MULTIHANCE GADOBENATE  DIMEGLUMINE 529 MG/ML IV SOLN COMPARISON:  None. FINDINGS: Bones: There is no evidence of acute fracture, dislocation or avascular necrosis. The visualized bony pelvis appears normal. The visualized sacroiliac joints and symphysis pubis appear normal. Articular cartilage and labrum  Articular cartilage: No focal chondral defect or subchondral signal abnormality identified. Labrum: There is a tear of the left anterior superior labrum. Joint or bursal effusion Joint effusion: Small left hip joint effusion with prominent synovial enhancement. No right hip joint effusion. Bursae: Prominent, rim enhancing fluid within the left iliopsoas bursa, extending superiorly into the left iliacus muscle. The bursa measures up to 2.5 x 3.3 cm in maximal AP by TR dimension. No greater trochanteric bursal fluid. Muscles and tendons Muscles and tendons: Prominent muscle edema and enhancement of the left iliacus muscle. Mild myofascial edema along the left vastus and adductor compartments. The visualized gluteus, hamstring and iliopsoas tendons appear normal. The piriformis muscles appear symmetric. Other findings Miscellaneous: Trace presacral edema. The visualized internal pelvic contents otherwise appear unremarkable. IMPRESSION: 1. Findings consistent with left hip septic arthritis and left iliopsoas septic bursitis. The left iliopsoas bursa extends into the left iliacus muscle, which demonstrates prominent muscle edema and enhancement, consistent with infectious myositis. The preliminary findings were called by telephone at the time of interpretation on 06/06/2017 at 4:00 am to Dr. Marcial Pacas OPYD, by Dr. Elgie Collard. Electronically Signed   By: Obie Dredge M.D.   On: 06/06/2017 08:15    Lab Data:  CBC: Recent Labs  Lab 06/05/17 1444 06/05/17 1457 06/06/17 0315 06/07/17 0210  WBC 23.0*  --  19.7* 18.0*  NEUTROABS 18.8*  --  15.1*  --   HGB 13.3 14.6 11.0* 11.2*  HCT 39.4 43.0 33.1* 33.1*  MCV 90.6  --  90.2 89.9  PLT 318  --  317 334   Basic Metabolic Panel: Recent Labs  Lab 06/05/17 1444 06/05/17 1457 06/06/17 0315 06/07/17 0210  NA 137 135 134* 134*  K 3.7 3.6 4.0 3.6  CL 96* 95* 99* 97*  CO2 27  --  24 27  GLUCOSE 109* 106* 82 123*  BUN 9 6 7 6   CREATININE 0.66 0.70 0.58*  0.64  CALCIUM 9.0  --  8.1* 8.2*   GFR: Estimated Creatinine Clearance: 128.2 mL/min (by C-G formula based on SCr of 0.64 mg/dL). Liver Function Tests: Recent Labs  Lab 06/05/17 1444 06/06/17 0315 06/07/17 0210  AST 67* 43* 41  ALT 63 47 39  ALKPHOS 201* 176* 185*  BILITOT 0.5 0.3 0.3  PROT 7.5 6.4* 5.6*  ALBUMIN 2.9* 2.3* 2.1*   No results for input(s): LIPASE, AMYLASE in the last 168 hours. No results for input(s): AMMONIA in the last 168 hours. Coagulation Profile: Recent Labs  Lab 06/07/17 0210  INR 1.11   Cardiac Enzymes: No results for input(s): CKTOTAL, CKMB, CKMBINDEX, TROPONINI in the last 168 hours. BNP (last 3 results) No results for input(s): PROBNP in the last 8760 hours. HbA1C: No results for input(s): HGBA1C in the last 72 hours. CBG: No results for input(s): GLUCAP in the last 168 hours. Lipid Profile: No results for input(s): CHOL, HDL, LDLCALC, TRIG, CHOLHDL, LDLDIRECT in the last 72 hours. Thyroid Function Tests: No results for input(s): TSH, T4TOTAL, FREET4, T3FREE, THYROIDAB in the last 72 hours. Anemia Panel: No results for input(s): VITAMINB12, FOLATE, FERRITIN, TIBC, IRON, RETICCTPCT in the last 72 hours. Urine analysis:    Component Value Date/Time   COLORURINE YELLOW 06/06/2017 1333   APPEARANCEUR CLEAR 06/06/2017  1333   LABSPEC 1.010 06/06/2017 1333   PHURINE 6.0 06/06/2017 1333   GLUCOSEU NEGATIVE 06/06/2017 1333   HGBUR NEGATIVE 06/06/2017 1333   BILIRUBINUR NEGATIVE 06/06/2017 1333   KETONESUR NEGATIVE 06/06/2017 1333   PROTEINUR NEGATIVE 06/06/2017 1333   UROBILINOGEN 0.2 10/03/2012 0856   NITRITE NEGATIVE 06/06/2017 1333   LEUKOCYTESUR NEGATIVE 06/06/2017 1333     Ripudeep Rai M.D. [facility] 06/07/2017, 1:58 PM  Pager: [phone] Between 7am to 7pm - call Pager - [phone]  After 7pm go to [email or web address] - password [email or web address]  Call night coverage person covering after 7pm

## 2017-06-07 NOTE — Op Note (Signed)
NAME:  Lance BoschBOONE, Fumio                ACCOUNT NO.:  MEDICAL RECORD NO.:  [id number]  LOCATION:                                 FACILITY:  PHYSICIAN:  Burnard BuntingG. Scott Dean, M.D.         DATE OF BIRTH:  DATE OF PROCEDURE: DATE OF DISCHARGE:                              OPERATIVE REPORT   PREOPERATIVE DIAGNOSIS:  Left hip infection.  POSTOPERATIVE DIAGNOSIS:  Left hip infection.  PROCEDURE:  Left hip incision and drainage with cultures obtained.  SURGEON:  Burnard BuntingG. Scott Dean, MD.  ASSISTANT:  Patrick Jupiterarla Bethune, RNFA.  INDICATIONS:  Cristal DeerChristopher is a 37 year old patient with history of IV drug abuse with left hip septic arthritis, who presents for operative management after explanation of risks and benefits.  PROCEDURE IN DETAIL:  The patient was brought to the operating room where general anesthetic was induced and perioperative IV antibiotics were continued.  Time-out was called.  The patient was placed on the Hana bed.  Left hip region prescrubbed with alcohol and Betadine, allowed to air dry, prepped with DuraPrep solution, and draped in a sterile manner.  Collier Flowersoban was used to cover the operative field.  Beginning 2 cm posterior and distal to the anterior superior iliac crest, an incision was made.  Skin and subcutaneous tissue were sharply divided. The fascia over the tensor fascia lata was divided.  The interval between the fascia lata and the rectus was developed.  Care was taken to avoid injury to the crossing circumflex vessels.  These were maintained. At the proximal aspect of the incision, 2 retractors were placed.  These were placed on either side of the capsule.  A longitudinal incision was made at the superior anterior portion of the capsule extending distally. Fluid was encountered and this was mildly cloudy fluid, which was sent for culture.  About a postage stamp-sized area of the capsule was excised.  Thorough irrigation then performed with the leg placed on some traction to  create space.  Thorough irrigation with 1 L through an Angiocath syringe of the hip joint was performed and then 3 more liters through this operative area.  Following this, stimulant beads were placed into this portion of the exposed hip capsule.  Hemovac drain also placed.  The fascia lata was then closed using 0 monofilament absorbable suture followed by 2-0 monofilament absorbable suture and 3-0 Monocryl. The patient was then transferred to the recovery room in stable condition.  Tolerated the procedure well without immediate complication.    Burnard BuntingG. Scott Dean, M.D.    GSD/MEDQ  D:  06/06/2017  T:  06/07/2017  Job:  [id number]

## 2017-06-07 NOTE — Anesthesia Procedure Notes (Signed)
Procedure Name: Intubation Date/Time: 06/07/2017 3:10 PM Performed by: Burt Ekurner, Tanara Turvey Ashley, CRNA Pre-anesthesia Checklist: Patient identified, Emergency Drugs available, Suction available and Patient being monitored Patient Re-evaluated:Patient Re-evaluated prior to induction Oxygen Delivery Method: Circle system utilized Preoxygenation: Pre-oxygenation with 100% oxygen Induction Type: IV induction Ventilation: Mask ventilation without difficulty Laryngoscope Size: Glidescope and 4 Grade View: Grade I Tube type: Oral Tube size: 7.5 mm Number of attempts: 1 Airway Equipment and Method: Stylet and Video-laryngoscopy Placement Confirmation: ETT inserted through vocal cords under direct vision,  positive ETCO2 and breath sounds checked- equal and bilateral Secured at: 22 cm Tube secured with: Tape Dental Injury: Teeth and Oropharynx as per pre-operative assessment

## 2017-06-07 NOTE — Progress Notes (Signed)
PT Cancellation Note  Patient Details Name: Lance BoschChristopher Badami MRN: [id number] DOB: 1981/04/14   Cancelled Treatment:    Reason Eval/Treat Not Completed: Other (comment)   Off the floor; presumed in surgery;   Will follow up tomorrow;   Van ClinesHolly Treysean Petruzzi, PT  Acute Rehabilitation Services Pager [phone] Office [phone]    Levi AlandHolly H Saraya Tirey 06/07/2017, 1:47 PM

## 2017-06-07 NOTE — Anesthesia Preprocedure Evaluation (Addendum)
Anesthesia Evaluation  Patient identified by MRN, date of birth, ID band Patient awake    Reviewed: Allergy & Precautions, NPO status , Patient's Chart, lab work & pertinent test results  Airway Mallampati: II       Dental  (+) Poor Dentition   Pulmonary Current Smoker,    Pulmonary exam normal breath sounds clear to auscultation       Cardiovascular negative cardio ROS   Rhythm:Regular Rate:Tachycardia  ECG: ST   Neuro/Psych negative psych ROS   GI/Hepatic negative GI ROS, (+)     substance abuse  IV drug use,   Endo/Other  negative endocrine ROS  Renal/GU negative Renal ROS     Musculoskeletal  (+) Arthritis , Septic   Abdominal Normal abdominal exam  (+)   Peds  Hematology  (+) Blood dyscrasia, anemia , MSSA bacteremia   Anesthesia Other Findings NECK ABSCESS  Reproductive/Obstetrics                             Anesthesia Physical  Anesthesia Plan  ASA: III  Anesthesia Plan: General   Post-op Pain Management:    Induction: Intravenous  PONV Risk Score and Plan: 2 and Ondansetron, Dexamethasone, Midazolam and Treatment may vary due to age or medical condition  Airway Management Planned: Oral ETT and Video Laryngoscope Planned  Additional Equipment:   Intra-op Plan:   Post-operative Plan: Extubation in OR  Informed Consent: I have reviewed the patients History and Physical, chart, labs and discussed the procedure including the risks, benefits and alternatives for the proposed anesthesia with the patient or authorized representative who has indicated his/her understanding and acceptance.   Dental advisory given  Plan Discussed with: CRNA  Anesthesia Plan Comments:         Anesthesia Quick Evaluation  

## 2017-06-07 NOTE — Anesthesia Postprocedure Evaluation (Signed)
Anesthesia Post Note  Patient: Lance BoschChristopher Fouty  Procedure(s) Performed: DEBRIDEMENT RIGHT STERNOCLAVICULAR ABSCESS (Right ) APPLICATION OF WOUND VAC (Right )     Patient location during evaluation: PACU Anesthesia Type: General Level of consciousness: awake and alert Pain management: pain level controlled Vital Signs Assessment: post-procedure vital signs reviewed and stable Respiratory status: spontaneous breathing, nonlabored ventilation, respiratory function stable and patient connected to nasal cannula oxygen Cardiovascular status: blood pressure returned to baseline and stable Postop Assessment: no apparent nausea or vomiting Anesthetic complications: no    Last Vitals:  Vitals:   06/07/17 1725 06/07/17 1740  BP: 133/77 129/76  Pulse: 91 86  Resp: 19 20  Temp:  36.5 C  SpO2: 96% 96%    Last Pain:  Vitals:   06/07/17 1740  TempSrc:   PainSc: 5                  Chadwick Reiswig P Dimitriy Carreras

## 2017-06-07 NOTE — Brief Op Note (Signed)
06/07/2017  4:20 PM  PATIENT:  Clifford Tucker  37 y.o. male  PRE-OPERATIVE DIAGNOSIS:  NECK ABSCESS  POST-OPERATIVE DIAGNOSIS:  NECK ABSCESS  PROCEDURE:  Procedure(s): DEBRIDEMENT RIGHT STERNOCLAVICULAR ABSCESS (Right) APPLICATION OF WOUND VAC (Right)  SURGEON:  Surgeon(s) and Role:    Kerin Perna* Van Trigt, Altie Savard, MD - Primary  PHYSICIAN ASSISTANT:   ASSISTANTS: none   ANESTHESIA:   general  EBL:  30 mL   BLOOD ADMINISTERED:none  DRAINS: none   LOCAL MEDICATIONS USED:  NONE  SPECIMEN:  Aspirate and Excision  DISPOSITION OF SPECIMEN:  microbiology  COUNTS:  YES  TOURNIQUET:  * No tourniquets in log *  DICTATION: .Dragon Dictation  PLAN OF CARE: return to [address] [place]  PATIENT DISPOSITION:  PACU - hemodynamically stable.   Delay start of Pharmacological VTE agent (>24hrs) due to surgical blood loss or risk of bleeding: yes

## 2017-06-07 NOTE — Transfer of Care (Signed)
Immediate Anesthesia Transfer of Care Note  Patient: Clifford BoschChristopher Tucker  Procedure(s) Performed: DEBRIDEMENT RIGHT STERNOCLAVICULAR ABSCESS (Right ) APPLICATION OF WOUND VAC (Right )  Patient Location: PACU  Anesthesia Type:General  Level of Consciousness: drowsy and patient cooperative  Airway & Oxygen Therapy: Patient Spontanous Breathing  Post-op Assessment: Report given to RN, Post -op Vital signs reviewed and stable and Patient moving all extremities X 4  Post vital signs: Reviewed and stable  Last Vitals:  Vitals:   06/06/17 2129 06/07/17 0530  BP: 119/69 127/70  Pulse: 96 92  Resp: 18 18  Temp: 36.9 C 37.5 C  SpO2: 96% 99%    Last Pain:  Vitals:   06/07/17 1320  TempSrc:   PainSc: 8          Complications: No apparent anesthesia complications

## 2017-06-08 ENCOUNTER — Inpatient Hospital Stay (HOSPITAL_COMMUNITY): Payer: Self-pay

## 2017-06-08 ENCOUNTER — Encounter (HOSPITAL_COMMUNITY): Payer: Self-pay | Admitting: Cardiothoracic Surgery

## 2017-06-08 DIAGNOSIS — B999 Unspecified infectious disease: Secondary | ICD-10-CM

## 2017-06-08 DIAGNOSIS — L0291 Cutaneous abscess, unspecified: Secondary | ICD-10-CM

## 2017-06-08 DIAGNOSIS — F1990 Other psychoactive substance use, unspecified, uncomplicated: Secondary | ICD-10-CM

## 2017-06-08 DIAGNOSIS — I340 Nonrheumatic mitral (valve) insufficiency: Secondary | ICD-10-CM

## 2017-06-08 DIAGNOSIS — B9562 Methicillin resistant Staphylococcus aureus infection as the cause of diseases classified elsewhere: Secondary | ICD-10-CM

## 2017-06-08 LAB — CBC
HCT: 34.6 % — ABNORMAL LOW (ref 39.0–52.0)
Hemoglobin: 11.5 g/dL — ABNORMAL LOW (ref 13.0–17.0)
MCH: 29.9 pg (ref 26.0–34.0)
MCHC: 33.2 g/dL (ref 30.0–36.0)
MCV: 89.9 fL (ref 78.0–100.0)
Platelets: 398 10*3/uL (ref 150–400)
RBC: 3.85 MIL/uL — ABNORMAL LOW (ref 4.22–5.81)
RDW: 14.7 % (ref 11.5–15.5)
WBC: 21.5 10*3/uL — ABNORMAL HIGH (ref 4.0–10.5)

## 2017-06-08 LAB — BASIC METABOLIC PANEL
Anion gap: 12 (ref 5–15)
BUN: 10 mg/dL (ref 6–20)
CO2: 24 mmol/L (ref 22–32)
Calcium: 8.6 mg/dL — ABNORMAL LOW (ref 8.9–10.3)
Chloride: 100 mmol/L — ABNORMAL LOW (ref 101–111)
Creatinine, Ser: 0.62 mg/dL (ref 0.61–1.24)
GFR calc Af Amer: >60 mL/min (ref >60)
GFR calc non Af Amer: >60 mL/min (ref >60)
Glucose, Bld: 168 mg/dL — ABNORMAL HIGH (ref 65–99)
Potassium: 4.1 mmol/L (ref 3.5–5.1)
Sodium: 136 mmol/L (ref 135–145)

## 2017-06-08 LAB — CULTURE, BLOOD (ROUTINE X 2)
SPECIAL REQUESTS: ADEQUATE
SPECIAL REQUESTS: ADEQUATE

## 2017-06-08 LAB — ECHOCARDIOGRAM COMPLETE
HEIGHTINCHES: 70 in
WEIGHTICAEL: 2479.73 [oz_av]

## 2017-06-08 MED ORDER — ENOXAPARIN SODIUM 40 MG/0.4ML ~~LOC~~ SOLN
40.0000 mg | SUBCUTANEOUS | Status: DC
  Administered 2017-06-09 – 2017-06-13 (×5): 40 mg via SUBCUTANEOUS
  Filled 2017-06-08 (×12): qty 0.4

## 2017-06-08 NOTE — Progress Notes (Addendum)
[address]       [place] [address]             [phone]      1 Day Post-Op Procedure(s) (LRB): DEBRIDEMENT RIGHT STERNOCLAVICULAR ABSCESS (Right) APPLICATION OF WOUND VAC (Right) Subjective: Some pain, not sleeping well  Objective: Vital signs in last 24 hours: Temp:  [97.5 F (36.4 C)-98.4 F (36.9 C)] 97.6 F (36.4 C) (01/17 0517) Pulse Rate:  [67-103] 67 (01/17 0517) Cardiac Rhythm: Normal sinus rhythm (01/16 1740) Resp:  [18-22] 18 (01/17 0517) BP: (115-144)/(60-81) 136/74 (01/17 0517) SpO2:  [95 %-99 %] 99 % (01/17 0517)  Hemodynamic parameters for last 24 hours:    Intake/Output from previous day: 01/16 0701 - 01/17 0700 In: 2696.5 [P.O.:360; I.V.:2036.5; IV Piggyback:300] Out: 95 [Drains:65; Blood:30] Intake/Output this shift: No intake/output data recorded.  General appearance: alert, cooperative and no distress Heart: regular rate and rhythm Lungs: slightly coarse Abdomen: benign Extremities: no edema Wound: vac in place, + surrounding erethema  Lab Results: Recent Labs    06/07/17 0210 06/08/17 0541  WBC 18.0* 21.5*  HGB 11.2* 11.5*  HCT 33.1* 34.6*  PLT 334 398   BMET:  Recent Labs    06/07/17 0210 06/08/17 0541  NA 134* 136  K 3.6 4.1  CL 97* 100*  CO2 27 24  GLUCOSE 123* 168*  BUN 6 10  CREATININE 0.64 0.62  CALCIUM 8.2* 8.6*    PT/INR:  Recent Labs    06/07/17 0210  LABPROT 14.2  INR 1.11   ABG    Component Value Date/Time   PHART 7.430 06/07/2017 0940   HCO3 27.5 06/07/2017 0940   TCO2 29 06/05/2017 1457   ACIDBASEDEF 4.0 (H) 03/13/2007 0120   O2SAT 90.2 06/07/2017 0940   CBG (last 3)  No results for input(s): GLUCAP in the last 72 hours.  Meds Scheduled Meds: . acetaminophen  1,000 mg Oral Q6H   Or  . acetaminophen (TYLENOL) oral liquid 160 mg/5 mL  1,000 mg Oral Q6H  . bisacodyl  10 mg Oral Daily  . Chlorhexidine Gluconate Cloth  6 each Topical Daily  . enoxaparin (LOVENOX)  injection  40 mg Subcutaneous Q24H  . iopamidol  20 mL Intra-articular Once  . lidocaine (PF)  5 mL Intradermal Once  . mupirocin ointment  1 application Nasal BID  . pneumococcal 23 valent vaccine  0.5 mL Intramuscular Tomorrow-1000  . senna-docusate  1 tablet Oral QHS  . sodium chloride  10 mL Intravenous Once   Continuous Infusions: . sodium chloride    .  ceFAZolin (ANCEF) IV Stopped (06/08/17 8295)  . dextrose 5 % and 0.45% NaCl 30 mL/hr at 06/07/17 2000  . lactated ringers 10 mL/hr at 06/06/17 1406  . lactated ringers 10 mL/hr at 06/07/17 1354  . potassium chloride     PRN Meds:.acetaminophen **OR** acetaminophen, diphenhydrAMINE, fentaNYL (SUBLIMAZE) injection, HYDROmorphone (DILAUDID) injection, ketorolac, metoCLOPramide **OR** metoCLOPramide (REGLAN) injection, ondansetron **OR** ondansetron (ZOFRAN) IV, oxyCODONE, potassium chloride, senna-docusate, zolpidem  Xrays Dg Chest Port 1 View  Result Date: 06/07/2017 CLINICAL DATA:  Shortness of breath, post debridement of RIGHT sternoclavicular abscess EXAM: PORTABLE CHEST 1 VIEW COMPARISON:  Portable exam 1639 hours compared to 06/05/2017 FINDINGS: Normal heart size, mediastinal contours, and pulmonary vascularity. Mild bronchitic changes. Question mild atelectasis versus infiltrate at medial RIGHT lung base. No additional infiltrate, pleural effusion or pneumothorax. No acute osseous findings. IMPRESSION: Mild chronic bronchitic changes with question minimal atelectasis versus infiltrate  at medial RIGHT lung base. Electronically Signed   By: Ulyses Southward M.D.   On: 06/07/2017 17:47   Results for orders placed or performed during the hospital encounter of 06/05/17  Blood culture (routine x 2)     Status: Abnormal (Preliminary result)   Collection Time: 06/05/17  2:44 PM  Result Value Ref Range Status   Specimen Description RIGHT ANTECUBITAL  Final   Special Requests   Final    BOTTLES DRAWN AEROBIC ONLY Blood Culture adequate volume     Culture  Setup Time   Final    GRAM POSITIVE COCCI Gram Stain Report Called to,Read Back By and Verified With: ROWE,C AT 0730 BY HUFFINES,S ON 06/06/17. AEROBIC BOTTLE ONLY Performed at [facility], [address] [place], [place] [address]    Culture STAPHYLOCOCCUS AUREUS (A)  Final   Report Status PENDING  Incomplete  Blood culture (routine x 2)     Status: Abnormal (Preliminary result)   Collection Time: 06/05/17  2:47 PM  Result Value Ref Range Status   Specimen Description LEFT ANTECUBITAL  Final   Special Requests   Final    BOTTLES DRAWN AEROBIC AND ANAEROBIC Blood Culture adequate volume   Culture  Setup Time   Final    GRAM POSITIVE COCCI BOTH AEB AND ANA Gram Stain Report Called to,Read Back By and Verified With: J.CRUISE, RN @ [facility] ED @ 0545 ON 1.15.19 BY BOWMAN,L CRITICAL RESULT CALLED TO, READ BACK BY AND VERIFIED WITH: N. Batchelder Pharm.D. 8:55 06/06/17 ([email or web address]) Performed at [facility], [address] [place], [place] [address]    Culture STAPHYLOCOCCUS AUREUS (A)  Final   Report Status PENDING  Incomplete  Blood Culture ID Panel (Reflexed)     Status: Abnormal   Collection Time: 06/05/17  2:47 PM  Result Value Ref Range Status   Enterococcus species NOT DETECTED NOT DETECTED Final   Listeria monocytogenes NOT DETECTED NOT DETECTED Final   Staphylococcus species DETECTED (A) NOT DETECTED Final    Comment: CRITICAL RESULT CALLED TO, READ BACK BY AND VERIFIED WITH: N. Batchelder Pharm.D. 8:55 06/06/17 ([email or web address])    Staphylococcus aureus DETECTED (A) NOT DETECTED Final    Comment: Methicillin (oxacillin) susceptible Staphylococcus aureus (MSSA). Preferred therapy is anti staphylococcal beta lactam antibiotic (Cefazolin or Nafcillin), unless clinically contraindicated. CRITICAL RESULT CALLED TO, READ BACK BY AND VERIFIED WITH: N. Batchelder Pharm.D. 8:55 06/06/17 ([email or web address])    Methicillin resistance NOT DETECTED NOT DETECTED Final   Streptococcus  species NOT DETECTED NOT DETECTED Final   Streptococcus agalactiae NOT DETECTED NOT DETECTED Final   Streptococcus pneumoniae NOT DETECTED NOT DETECTED Final   Streptococcus pyogenes NOT DETECTED NOT DETECTED Final   Acinetobacter baumannii NOT DETECTED NOT DETECTED Final   Enterobacteriaceae species NOT DETECTED NOT DETECTED Final   Enterobacter cloacae complex NOT DETECTED NOT DETECTED Final   Escherichia coli NOT DETECTED NOT DETECTED Final   Klebsiella oxytoca NOT DETECTED NOT DETECTED Final   Klebsiella pneumoniae NOT DETECTED NOT DETECTED Final   Proteus species NOT DETECTED NOT DETECTED Final   Serratia marcescens NOT DETECTED NOT DETECTED Final   Haemophilus influenzae NOT DETECTED NOT DETECTED Final   Neisseria meningitidis NOT DETECTED NOT DETECTED Final   Pseudomonas aeruginosa NOT DETECTED NOT DETECTED Final   Candida albicans NOT DETECTED NOT DETECTED Final   Candida glabrata NOT DETECTED NOT DETECTED Final   Candida krusei NOT DETECTED NOT DETECTED Final   Candida parapsilosis NOT DETECTED NOT DETECTED Final  Candida tropicalis NOT DETECTED NOT DETECTED Final  Culture, blood (routine x 2)     Status: None (Preliminary result)   Collection Time: 06/06/17  1:45 PM  Result Value Ref Range Status   Specimen Description BLOOD RIGHT ANTECUBITAL  Final   Special Requests   Final    BOTTLES DRAWN AEROBIC AND ANAEROBIC Blood Culture adequate volume   Culture NO GROWTH 1 DAY  Final   Report Status PENDING  Incomplete  Culture, blood (routine x 2)     Status: None (Preliminary result)   Collection Time: 06/06/17  1:54 PM  Result Value Ref Range Status   Specimen Description BLOOD LEFT ANTECUBITAL  Final   Special Requests   Final    BOTTLES DRAWN AEROBIC AND ANAEROBIC Blood Culture adequate volume   Culture NO GROWTH 1 DAY  Final   Report Status PENDING  Incomplete  Aerobic/Anaerobic Culture (surgical/deep wound)     Status: None (Preliminary result)   Collection Time:  06/06/17  4:09 PM  Result Value Ref Range Status   Specimen Description WOUND LEFT HIP  Final   Special Requests PT ON VANC ANCEF ROCEPHIN ZOSYN  Final   Gram Stain   Final    NO WBC SEEN NO SQUAMOUS EPITHELIAL CELLS SEEN NO ORGANISMS SEEN    Culture   Final    RARE STAPHYLOCOCCUS AUREUS CULTURE REINCUBATED FOR BETTER GROWTH    Report Status PENDING  Incomplete  Surgical pcr screen     Status: Abnormal   Collection Time: 06/06/17  8:38 PM  Result Value Ref Range Status   MRSA, PCR NEGATIVE NEGATIVE Final   Staphylococcus aureus POSITIVE (A) NEGATIVE Final    Comment: (NOTE) The Xpert SA Assay (FDA approved for NASAL specimens in patients 24 years of age and older), is one component of a comprehensive surveillance program. It is not intended to diagnose infection nor to guide or monitor treatment.   Culture, sputum-assessment     Status: None   Collection Time: 06/07/17  9:14 AM  Result Value Ref Range Status   Specimen Description SPUTUM  Final   Special Requests NONE  Final   Sputum evaluation THIS SPECIMEN IS ACCEPTABLE FOR SPUTUM CULTURE  Final   Report Status 06/07/2017 FINAL  Final  Culture, respiratory (NON-Expectorated)     Status: None (Preliminary result)   Collection Time: 06/07/17  9:14 AM  Result Value Ref Range Status   Specimen Description SPUTUM  Final   Special Requests NONE Reflexed from [id number]  Final   Gram Stain   Final    FEW WBC PRESENT, PREDOMINANTLY PMN NO ORGANISMS SEEN    Culture PENDING  Incomplete   Report Status PENDING  Incomplete  Aerobic/Anaerobic Culture (surgical/deep wound)     Status: None (Preliminary result)   Collection Time: 06/07/17  3:32 PM  Result Value Ref Range Status   Specimen Description WOUND  Final   Special Requests CHEST WALL ABSCESS  Final   Gram Stain   Final    ABUNDANT WBC PRESENT, PREDOMINANTLY PMN MODERATE GRAM POSITIVE COCCI    Culture PENDING  Incomplete   Report Status PENDING  Incomplete    Assessment/Plan: S/P Procedure(s) (LRB): DEBRIDEMENT RIGHT STERNOCLAVICULAR ABSCESS (Right) APPLICATION OF WOUND VAC (Right)  1 doing well from sternoclavicular joint debridement 2 await OR cultures on current abx, leukocytosis increased 3 cont VAC   LOS: 3 days    Glenice Laine Gold 06/08/2017  Change VAC sponge Fri by Wound Care- he has a small  white sponge deep then a larger black sponge on top  Echo images reviewed- no endocarditis, good LV fx  patient examined and medical record reviewed,agree with above note. [name] 06/08/2017

## 2017-06-08 NOTE — Evaluation (Signed)
Physical Therapy Evaluation Patient Details Name: Clifford Tucker MRN: [id number] DOB: [birth year] Today's Date: 06/08/2017   History of Present Illness  37yo male admitted with complaints of severe pain and edema R anterior neck, SOB, cough, history of IV drug use and soft tissue infection. He also has recently experienced severe L hip pain for which an outpatient MD gave him crutches and analgesics. 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 Recieved I&D for his hip 06/06/17, and debridement R sternoclavicular abscess with wound vac placement on 06/07/17. PMH pneumonia, shoulder surgery   Clinical Impression    Patient received in bed, pleasant and willing to participate with PT this afternoon; he reports he was very independent at baseline and worked as a Development worker, community, he had no previous difficulties with mobility or functional tasks. He reports he has been getting himself up to go to the bathroom in his room during his hospital stay. Note patient is able to perform all bed mobility on a ModI basis, but does require S for functional transfers and gait inside of his room. He able to ambulate inside his room with no device, slow and steady but with significant antalgic pattern and with reports of L hip pain with gait; introduced SPC, patient able to sequence and use device correctly, reports relief of L hip pain with ambulation. He will benefit from skilled PT services while in the hospital with potential transition to mobility team care, followed by skilled OP PT services moving forward. He was left in bed with family present and all needs otherwise met this afternoon.     Follow Up Recommendations Outpatient PT    Equipment Recommendations  Cane    Recommendations for Other Services       Precautions / Restrictions Precautions Precautions: Fall Precaution Comments: wound vac  Restrictions Weight Bearing Restrictions: No      Mobility  Bed Mobility Overal bed mobility: Modified Independent             General bed mobility comments: also able to put on socks at EOB   Transfers Overall transfer level: Needs assistance Equipment used: None;Straight cane Transfers: Sit to/from Stand Sit to Stand: Supervision         General transfer comment: able to perform functional transfers with no device and S, also able to perform with SPC and S   Ambulation/Gait Ambulation/Gait assistance: Supervision Ambulation Distance (Feet): 40 Feet(in room ) Assistive device: None;Straight cane Gait Pattern/deviations: Step-through pattern;Decreased step length - right;Decreased step length - left;Antalgic     General Gait Details: ambulated in room first with no device, patient steady but does demonstrate significant antalgic pattern; then trialed SPC to offload painful L hip, pain reduced, patient moving slowly but steadily   Stairs            Wheelchair Mobility    Modified Rankin (Stroke Patients Only)       Balance Overall balance assessment: Needs assistance Sitting-balance support: Bilateral upper extremity supported;Feet supported Sitting balance-Leahy Scale: Good     Standing balance support: During functional activity Standing balance-Leahy Scale: Fair                               Pertinent Vitals/Pain Pain Assessment: No/denies pain(none at rest, severe burning L Hip with mobility )    Home Living Family/patient expects to be discharged to:: Private residence Living Arrangements: Parent Available Help  at Discharge: Family Type of Home: House Home Access: Level entry     Home Layout: One level Home Equipment: None      Prior Function Level of Independence: Independent         Comments: works as a Archivist Extremity Assessment Upper Extremity Assessment: Overall WFL for tasks assessed    Lower Extremity  Assessment Lower Extremity Assessment: Overall WFL for tasks assessed    Cervical / Trunk Assessment Cervical / Trunk Assessment: Normal  Communication   Communication: No difficulties  Cognition Arousal/Alertness: Awake/alert Behavior During Therapy: WFL for tasks assessed/performed Overall Cognitive Status: Within Functional Limits for tasks assessed                                        General Comments General comments (skin integrity, edema, etc.): patient has been getting up in his room by himself to use the bathroom     Exercises     Assessment/Plan    PT Assessment Patient needs continued PT services  PT Problem List Decreased strength;Decreased mobility;Decreased safety awareness;Decreased coordination;Decreased activity tolerance;Decreased balance;Pain;Decreased knowledge of use of DME       PT Treatment Interventions DME instruction;Therapeutic activities;Gait training;Therapeutic exercise;Patient/family education;Stair training;Balance training;Functional mobility training;Neuromuscular re-education    PT Goals (Current goals can be found in the Care Plan section)  Acute Rehab PT Goals Patient Stated Goal: to go home/get well  PT Goal Formulation: With patient Time For Goal Achievement: 06/15/17 Potential to Achieve Goals: Good    Frequency Min 3X/week   Barriers to discharge        Co-evaluation               AM-PAC PT "6 Clicks" Daily Activity  Outcome Measure Difficulty turning over in bed (including adjusting bedclothes, sheets and blankets)?: None Difficulty moving from lying on back to sitting on the side of the bed? : None Difficulty sitting down on and standing up from a chair with arms (e.g., wheelchair, bedside commode, etc,.)?: None Help needed moving to and from a bed to chair (including a wheelchair)?: A Little Help needed walking in hospital room?: A Little Help needed climbing 3-5 steps with a railing? : A Little 6  Click Score: 21    End of Session   Activity Tolerance: Patient tolerated treatment well Patient left: in bed;with call bell/phone within reach;with family/visitor present   PT Visit Diagnosis: Unsteadiness on feet (R26.81);Difficulty in walking, not elsewhere classified (R26.2);Pain Pain - Right/Left: Left Pain - part of body: Hip    Time: 1340-1405 PT Time Calculation (min) (ACUTE ONLY): 25 min   Charges:   PT Evaluation $PT Eval Moderate Complexity: 1 Mod PT Treatments $Self Care/Home Management: 8-22   PT G Codes:        Deniece Ree PT, DPT, CBIS  Supplemental Physical Therapist [place]   Pager [phone]

## 2017-06-08 NOTE — Consult Note (Signed)
Chief Complaint: Patient was seen in consultation today for iliacus fluid collection  Referring Physician(s): Dr. August Saucerean  Supervising Physician: Irish LackYamagata, Glenn  [facility]  History of Present Illness: Clifford Tucker is a 37 y.o. 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  Patient was found to have septic arthritis and neck abscess. He underwent I&D of hip with Dr. August Saucerean 1/15 and debridement and wound VAC placement of neck abscess with Dr. Donata ClayVan Trigt 1/16.  MR also showed a fluid collection within the iliacus muscle.  IR consulted for aspiration and drainage and the request of Dr. August Saucerean.    Past Medical History:  Diagnosis Date  . Hx of septic arthritis    Left hip  . MSSA (methicillin susceptible Staphylococcus aureus) 05/2017  . MSSA bacteremia   . Normocytic anemia   . Pneumonia     Past Surgical History:  Procedure Laterality Date  . APPLICATION OF WOUND VAC Right 06/07/2017   Procedure: APPLICATION OF WOUND VAC;  Surgeon: Kerin PernaVan Trigt, Peter, MD;  [facility];  Service: Thoracic;  Laterality: Right;  . HIP DEBRIDEMENT Left 06/06/2017   IRRIGATION AND DEBRIDEMENT HIP  . INCISION AND DRAINAGE HIP Left 06/06/2017   Procedure: IRRIGATION AND DEBRIDEMENT HIP;  Surgeon: Cammy Copaean, Gregory Scott, MD;  [facility];  Service: Orthopedics;  Laterality: Left;  . SHOULDER SURGERY    . STERNAL WOUND DEBRIDEMENT Right 06/07/2017   Procedure: DEBRIDEMENT RIGHT STERNOCLAVICULAR ABSCESS;  Surgeon: Kerin PernaVan Trigt, Peter, MD;  [facility];  Service: Thoracic;  Laterality: Right;    Allergies: Patient has no known allergies.  Medications: Prior to Admission medications   Medication Sig Start Date End Date Taking? Authorizing Provider  Aspirin-Salicylamide-Caffeine (BC HEADACHE POWDER PO) Take 2-6 packets by mouth daily as needed (pain).   Yes [provider]  HYDROcodone-acetaminophen (NORCO/VICODIN) 5-325 MG per tablet 1 or 2 tabs PO q6 hours prn pain Patient taking differently: Take 1-2 tablets by mouth every 6 (six) hours as needed for moderate pain.  10/03/12  Yes Samuel JesterMcManus, Kathleen, DO  ibuprofen (ADVIL,MOTRIN) 200 MG tablet Take 200-400 mg by mouth every 6 (six) hours as needed for mild pain.   Yes [provider]     Family History  Problem Relation Age of Onset  . Drug abuse Mother     Social History   Socioeconomic History  . Marital status: Single    Spouse name: None  . Number of children: None  . Years of education: None  . Highest education level: None  Social Needs  . Financial resource strain: None  . Food insecurity - worry: None  . Food insecurity - inability: None  . Transportation needs - medical: None  . Transportation needs - non-medical: None  Occupational History  . None  Tobacco Use  . Smoking status: Current Every Day Smoker    Packs/day: 1.00    Types: Cigarettes  . Smokeless tobacco: Never Used  Substance and Sexual Activity  . Alcohol use: Yes    Comment: RARE  . Drug use: Yes    Types: Marijuana, Heroin    Comment: 06/05/17  . Sexual activity: None  Other Topics Concern  . None  Social History Narrative  . None    Review of Systems  Constitutional: Negative for fatigue and fever.  Respiratory: Negative for cough and shortness of breath.  Cardiovascular: Negative for chest pain.  Gastrointestinal: Negative for abdominal pain.  Musculoskeletal: Negative for back pain.  Psychiatric/Behavioral: Negative for behavioral problems and confusion.    Vital Signs: BP 136/74 (BP Location: Right Arm)   Pulse 67   Temp 97.6 F (36.4 C) (Oral)   Resp 18   Ht 5\' 10"  (1.778 m)   Wt 154 lb 15.7 oz (70.3 kg)   SpO2 99%   BMI 22.24 kg/m   Physical Exam  Constitutional: He is oriented to person, place, and time. He appears well-developed.  Cardiovascular: Normal rate,  regular rhythm and normal heart sounds.  Pulmonary/Chest: Effort normal and breath sounds normal. No respiratory distress.  Abdominal: Soft.  Musculoskeletal:  Wound vac in place on neck  Neurological: He is alert and oriented to person, place, and time.  Skin: Skin is warm and dry.  Psychiatric: He has a normal mood and affect. His behavior is normal. Judgment and thought content normal.  Nursing note and vitals reviewed.   Imaging: Dg Chest 2 View  Result Date: 06/05/2017 CLINICAL DATA:  Shortness of Breath EXAM: CHEST  2 VIEW COMPARISON:  December 01, 2011 FINDINGS: There is patchy infiltrate in the lateral right base. Lungs elsewhere are clear. Heart size and pulmonary vascularity are normal. No adenopathy. No bone lesions. IMPRESSION: Infiltrate consistent with pneumonia lateral right base. Lungs elsewhere clear. Cardiac silhouette within normal limits. Electronically Signed   By: Bretta Bang III M.D.   On: 06/05/2017 14:47   Ct Soft Tissue Neck W Contrast  Result Date: 06/05/2017 CLINICAL DATA:  Neck mass.  Hard, red, raised area in the neck. EXAM: CT NECK WITH CONTRAST TECHNIQUE: Multidetector CT imaging of the neck was performed using the standard protocol following the bolus administration of intravenous contrast. CONTRAST:  ISOVUE-300 IOPAMIDOL (ISOVUE-300) INJECTION 61% COMPARISON:  None. FINDINGS: Pharynx and larynx: No pharyngeal or laryngeal mass. Patent airway. No retropharyngeal fluid collection. Salivary glands: No inflammation, mass, or stone. Thyroid: Mass effect on the right thyroid lobe by the inflammatory process described below. No intrinsic thyroid abnormality identified. Lymph nodes: Subcentimeter but asymmetric anterior and posterior cervical lymph nodes throughout the right neck, likely reactive. Vascular: Major vascular structures of the neck are patent. The below described inflammatory process extends into the right carotid space in the lower neck. Limited  intracranial: Unremarkable. Visualized orbits: Not imaged. Mastoids and visualized paranasal sinuses: Partially visualized polypoid mucosal thickening or small mucous retention cyst in the left maxillary sinus. Visualized mastoid air cells are clear. Skeleton: There is a heterogeneous gas and fluid collection along the medial aspect of the right sternocleidomastoid muscle in the lower neck beginning near the clavicular head. This collection extends posteriorly and inferiorly into the anterior mediastinum, more fully evaluated on the separate chest CT. Phlegmon extends superiorly within/along the right sternocleidomastoid muscle in the lower and mid neck. Fluid extends superiorly in the right neck deep to the strap muscles and contains a few locules of gas, however this fluid does not appear as organized as the collections in the lower neck and upper chest. Inflammation in the right neck extends just superior to the level of the thyroid cartilage. No erosive changes are seen at the right sternoclavicular joint. Upper chest: Reported separately. Other: None. IMPRESSION: Gas and fluid collections in the anterior upper chest as reported on separate chest CT, concerning for abscesses. Phlegmon and less organized gas and fluid extend superiorly in the anterior right neck with suspected myositis of the right sternocleidomastoid muscle.  Electronically Signed   By: Sebastian Ache M.D.   On: 06/05/2017 16:31   Ct Chest W Contrast  Result Date: 06/05/2017 CLINICAL DATA:  Patient with shortness of breath. Red raised area anterior lower neck. EXAM: CT CHEST WITH CONTRAST TECHNIQUE: Multidetector CT imaging of the chest was performed during intravenous contrast administration. CONTRAST:  ISOVUE-300 IOPAMIDOL (ISOVUE-300) INJECTION 61% COMPARISON:  Chest CT 06/21/2005; neck CT same day. FINDINGS: Cardiovascular: Normal heart size. No pericardial effusion. Normal caliber thoracic aorta. Enlarged main pulmonary artery (3.3  cm) as can be seen with pulmonary arterial hypertension. Mediastinum/Nodes: 1.2 cm AP window lymph node (image 50; series 2). 7 mm right paratracheal lymph node (image 50; series 2). Normal appearance of the esophagus. No axillary adenopathy. Lungs/Pleura: Central airways are patent. Subpleural consolidation with adjacent tree-in-bud ground-glass nodularity within the right lower lobe (image 114; series 4). Within the right upper lobe there multiple areas of regional ground-glass attenuation. No pleural effusion or pneumothorax. Upper Abdomen: No acute process. Musculoskeletal: There is a large centrally necrotic peripherally enhancing mass within the lower neck which extends from the anterior aspect of the right sternal clavicular joint/distal right sternocleidomastoid superiorly along the right aspect of the thyroid and inferiorly within the retrosternal location. The largest portion of this mass measures 4.7 x 4.1 cm. There are multiple small foci of gas within the mass. No definite osseous destruction demonstrated within the right sternoclavicular joint. IMPRESSION: 1. There is a large peripherally enhancing mass with central fluid and gas within the right lower neck extending from the anterior aspect of the right sternoclavicular location posteriorly along the retrosternal soft tissues. Findings are concerning for abscess. No definite osseous destruction at this time of the right sternoclavicular joint or adjacent costochondral joint however secondary infection of these joints is not excluded given the size of the suspected abscess. 2. Consolidation within the right lower lobe with right upper lobe ground-glass opacities favored to represent multifocal pneumonia. 3. Mediastinal and right hilar adenopathy likely reactive in etiology. Critical Value/emergent results were called by telephone at the time of interpretation on 06/05/2017 at 4:18 pm to Dr. Fayrene Fearing, who verbally acknowledged these results. Electronically  Signed   By: Annia Belt M.D.   On: 06/05/2017 16:29   Mr Hip Left W Wo Contrast  Result Date: 06/06/2017 CLINICAL DATA:  Severe left hip pain for the past few weeks. History of IV drug abuse and recurrent skin and soft tissue infections. EXAM: MRI OF THE LEFT HIP WITHOUT AND WITH CONTRAST TECHNIQUE: Multiplanar, multisequence MR imaging was performed both before and after administration of intravenous contrast. CONTRAST:  15mL MULTIHANCE GADOBENATE DIMEGLUMINE 529 MG/ML IV SOLN COMPARISON:  None. FINDINGS: Bones: There is no evidence of acute fracture, dislocation or avascular necrosis. The visualized bony pelvis appears normal. The visualized sacroiliac joints and symphysis pubis appear normal. Articular cartilage and labrum Articular cartilage: No focal chondral defect or subchondral signal abnormality identified. Labrum: There is a tear of the left anterior superior labrum. Joint or bursal effusion Joint effusion: Small left hip joint effusion with prominent synovial enhancement. No right hip joint effusion. Bursae: Prominent, rim enhancing fluid within the left iliopsoas bursa, extending superiorly into the left iliacus muscle. The bursa measures up to 2.5 x 3.3 cm in maximal AP by TR dimension. No greater trochanteric bursal fluid. Muscles and tendons Muscles and tendons: Prominent muscle edema and enhancement of the left iliacus muscle. Mild myofascial edema along the left vastus and adductor compartments. The visualized gluteus, hamstring  and iliopsoas tendons appear normal. The piriformis muscles appear symmetric. Other findings Miscellaneous: Trace presacral edema. The visualized internal pelvic contents otherwise appear unremarkable. IMPRESSION: 1. Findings consistent with left hip septic arthritis and left iliopsoas septic bursitis. The left iliopsoas bursa extends into the left iliacus muscle, which demonstrates prominent muscle edema and enhancement, consistent with infectious myositis. The  preliminary findings were called by telephone at the time of interpretation on 06/06/2017 at 4:00 am to Dr. Marcial Pacas OPYD, by Dr. Elgie Collard. Electronically Signed   By: Obie Dredge M.D.   On: 06/06/2017 08:15   Dg Chest Port 1 View  Result Date: 06/07/2017 CLINICAL DATA:  Shortness of breath, post debridement of RIGHT sternoclavicular abscess EXAM: PORTABLE CHEST 1 VIEW COMPARISON:  Portable exam 1639 hours compared to 06/05/2017 FINDINGS: Normal heart size, mediastinal contours, and pulmonary vascularity. Mild bronchitic changes. Question mild atelectasis versus infiltrate at medial RIGHT lung base. No additional infiltrate, pleural effusion or pneumothorax. No acute osseous findings. IMPRESSION: Mild chronic bronchitic changes with question minimal atelectasis versus infiltrate at medial RIGHT lung base. Electronically Signed   By: Ulyses Southward M.D.   On: 06/07/2017 17:47    Labs:  CBC: Recent Labs    06/05/17 1444 06/05/17 1457 06/06/17 0315 06/07/17 0210 06/08/17 0541  WBC 23.0*  --  19.7* 18.0* 21.5*  HGB 13.3 14.6 11.0* 11.2* 11.5*  HCT 39.4 43.0 33.1* 33.1* 34.6*  PLT 318  --  317 334 398    COAGS: Recent Labs    06/07/17 0210  INR 1.11  APTT 39*    BMP: Recent Labs    06/05/17 1444 06/05/17 1457 06/06/17 0315 06/07/17 0210 06/08/17 0541  NA 137 135 134* 134* 136  K 3.7 3.6 4.0 3.6 4.1  CL 96* 95* 99* 97* 100*  CO2 27  --  24 27 24   GLUCOSE 109* 106* 82 123* 168*  BUN 9 6 7 6 10   CALCIUM 9.0  --  8.1* 8.2* 8.6*  CREATININE 0.66 0.70 0.58* 0.64 0.62  GFRNONAA >60  --  >60 >60 >60  GFRAA >60  --  >60 >60 >60    LIVER FUNCTION TESTS: Recent Labs    06/05/17 1444 06/06/17 0315 06/07/17 0210  BILITOT 0.5 0.3 0.3  AST 67* 43* 41  ALT 63 47 39  ALKPHOS 201* 176* 185*  PROT 7.5 6.4* 5.6*  ALBUMIN 2.9* 2.3* 2.1*    TUMOR MARKERS: No results for input(s): AFPTM, CEA, CA199, CHROMGRNA in the last 8760 hours.  Assessment and Plan: Iliacus fluid  collection Patient with septic arthritis of left hip and neck abscess s/p intervention with ortho surgery and CT surgery.  Iliacus fluid collection was identified by MR.   WBC remains persistently elevated, now 21K. IR consulted for aspiration and drainage of fluid collection.  Case reviewed by Dr. Fredia Sorrow who approves patient for procedure.  Patient will be NPO after midnight.  Risks and benefits discussed with the patient including bleeding, infection, damage to adjacent structures, and sepsis. All of the patient's questions were answered, patient is agreeable to proceed. Consent signed and in chart.  Thank you for this interesting consult.  I greatly enjoyed meeting Hai Grabe and look forward to participating in their care.  A copy of this report was sent to the requesting provider on this date.  Electronically Signed: Hoyt Koch, PA 06/08/2017, 3:17 PM   I spent a total of 40 Minutes    in face to face in clinical consultation,  greater than 50% of which was counseling/coordinating care for iliacus fluid collection.

## 2017-06-08 NOTE — Progress Notes (Signed)
[facility]                                                                              Patient Demographics  Clifford Tucker, is a 37 y.o. male, DOB - December 11, 1980, ZOX:[id number]  Admit date - 06/05/2017   Admitting Physician Briscoe Deutscher, MD  Outpatient Primary MD for the patient is Patient, No Pcp Per  Outpatient specialists:   LOS - 3  days   Medical records reviewed and are as summarized below:    Chief Complaint  Patient presents with  . Oral Swelling       Brief summary   Clifford Tucker is a 37 y.o. malewith medical history significant forIV drug abuse and recurrent skin and soft tissue infections. He presented with neck/hip pain found to have a neck abscess, septic arthritis of the hip and now MSSA bacteremia. CT surgery, orthopedic surgery and infectious disease consulted.   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  Reported having history of multiple abscesses over the last few weeks -Patient was originally placed on vancomycin and Zosyn, transition to IV Ancef -Sternal aspirate showing moderate staph aureus, left hip wound cultures showing rare staph aureus, sensitivities pending -ID following -2D echo results pending, repeat blood cultures negative so far  Active Problems:   Multifocal pneumonia -ID following, continue cefazolin  Left hip abscess/iliopsoas septic arthritis, infectious myositis -Status post left hip I&D with cultures done on 1/15, showing staph aureus - Continue IV Ancef for now, ID following -IR consulted for iliacus fluid collection drainage  Septic arthritis of the sternoclavicular joint, abscess -CT VS following, underwent debridement with wound VAC of the neck abscess    IV drug user -Per patient, has  been using heroin, [facility] but relapsed a month ago  -Last heroin use a week ago, so far not in any withdrawals, monitor closely -Social work consult for possible rehab at discharge   Code Status: Full code DVT Prophylaxis: Lovenox Family Communication: Discussed in detail with the patient, all imaging results, lab results explained to the patient and girlfriend in the room   Disposition Plan:   Time Spent in minutes   25 minutes  Procedures:  I&D left hip 1/15  Consultants:   Orthopedics CT VS ID  Antimicrobials:      Medications  Scheduled Meds: . acetaminophen  1,000 mg Oral Q6H   Or  . acetaminophen (TYLENOL) oral liquid 160 mg/5 mL  1,000 mg Oral Q6H  . bisacodyl  10 mg Oral Daily  . Chlorhexidine Gluconate Cloth  6 each Topical Daily  . [START ON 06/09/2017] enoxaparin (LOVENOX) injection  40 mg Subcutaneous Q24H  . iopamidol  20 mL Intra-articular Once  . lidocaine (PF)  5 mL Intradermal Once  . mupirocin ointment  1 application Nasal BID  . senna-docusate  1 tablet Oral QHS  . sodium chloride  10 mL Intravenous Once  Continuous Infusions: . sodium chloride    .  ceFAZolin (ANCEF) IV Stopped (06/08/17 1005)  . dextrose 5 % and 0.45% NaCl 30 mL/hr at 06/07/17 2000  . lactated ringers 10 mL/hr at 06/06/17 1406  . lactated ringers 10 mL/hr at 06/07/17 1354  . potassium chloride     PRN Meds:.acetaminophen **OR** acetaminophen, diphenhydrAMINE, fentaNYL (SUBLIMAZE) injection, HYDROmorphone (DILAUDID) injection, ketorolac, metoCLOPramide **OR** metoCLOPramide (REGLAN) injection, ondansetron **OR** ondansetron (ZOFRAN) IV, oxyCODONE, potassium chloride, senna-docusate, zolpidem   Antibiotics   Anti-infectives (From admission, onward)   Start     Dose/Rate Route Frequency Ordered Stop   06/07/17 1400  cefUROXime (ZINACEF) 1.5 g in dextrose 5 % 50 mL IVPB     1.5 g 100 mL/hr over 30 Minutes Intravenous To [facility] 06/07/17  0125 06/07/17 1527   06/07/17 0745  vancomycin (VANCOCIN) 1,000 mg in sodium chloride 0.9 % 1,000 mL irrigation      Irrigation To Surgery 06/07/17 0744 06/07/17 1513   06/06/17 1639  gentamicin (GARAMYCIN) injection  Status:  Discontinued       As needed 06/06/17 1640 06/06/17 1715   06/06/17 1638  vancomycin (VANCOCIN) powder  Status:  Discontinued       As needed 06/06/17 1639 06/06/17 1715   06/06/17 1000  ceFAZolin (ANCEF) IVPB 2g/100 mL premix     2 g 200 mL/hr over 30 Minutes Intravenous Every 8 hours 06/06/17 0911     06/06/17 0000  vancomycin (VANCOCIN) IVPB 1000 mg/200 mL premix  Status:  Discontinued     1,000 mg 200 mL/hr over 60 Minutes Intravenous Every 8 hours 06/05/17 2211 06/06/17 0911   06/05/17 2330  piperacillin-tazobactam (ZOSYN) IVPB 3.375 g  Status:  Discontinued     3.375 g 12.5 mL/hr over 240 Minutes Intravenous Every 8 hours 06/05/17 2211 06/06/17 0911   06/05/17 2200  cefTRIAXone (ROCEPHIN) 1 g in dextrose 5 % 50 mL IVPB  Status:  Discontinued     1 g 100 mL/hr over 30 Minutes Intravenous Every 24 hours 06/05/17 2140 06/05/17 2202   06/05/17 2200  azithromycin (ZITHROMAX) 500 mg in dextrose 5 % 250 mL IVPB  Status:  Discontinued     500 mg 250 mL/hr over 60 Minutes Intravenous Every 24 hours 06/05/17 2140 06/06/17 0911   06/05/17 1600  vancomycin (VANCOCIN) 1,500 mg in sodium chloride 0.9 % 500 mL IVPB     1,500 mg 250 mL/hr over 120 Minutes Intravenous  Once 06/05/17 1542 06/05/17 1855   06/05/17 1515  piperacillin-tazobactam (ZOSYN) IVPB 3.375 g     3.375 g 100 mL/hr over 30 Minutes Intravenous  Once 06/05/17 1512 06/05/17 1557        Subjective:   Clifford Tucker was seen and examined today.  States form back fell out from the left hip wound, pain better controlled today.  No fevers or chills.  Slept better last night.  Patient denies dizziness, chest pain, shortness of breath, abdominal pain, N/V/D/C, new weakness, numbess, tingling. No acute events  overnight.    Objective:   Vitals:   06/07/17 1725 06/07/17 1740 06/07/17 2111 06/08/17 0517  BP: 133/77 129/76 115/60 136/74  Pulse: 91 86 84 67  Resp: 19 20 18 18   Temp:  97.7 F (36.5 C) (!) 97.5 F (36.4 C) 97.6 F (36.4 C)  TempSrc:   Oral Oral  SpO2: 96% 96% 98% 99%  Weight:      Height:        Intake/Output Summary (Last 24  hours) at 06/08/2017 1440 Last data filed at 06/08/2017 1610 Gross per 24 hour  Intake 2596.5 ml  Output 95 ml  Net 2501.5 ml     Wt Readings from Last 3 Encounters:  06/06/17 70.3 kg (154 lb 15.7 oz)  10/03/12 72.6 kg (160 lb)     Exam   General: Alert and oriented x 3, NAD  Eyes:   HEENT: + wound VAC with surrounding erythema  Cardiovascular: S1 S2 clear, RRR auscultated, No pedal edema b/l  Respiratory: Clear to auscultation bilaterally, no wheezing, rales or rhonchi  Gastrointestinal: Soft, nontender, nondistended, + bowel sounds  Ext: dressing intact on the left upper thigh  Neuro: no new deficits  Musculoskeletal: No digital cyanosis, clubbing  Skin: No rashes  Psych: Normal affect and demeanor, alert and oriented x3    Data Reviewed:  I have personally reviewed following labs and imaging studies  Micro Results Recent Results (from the past 240 hour(s))  Blood culture (routine x 2)     Status: Abnormal   Collection Time: 06/05/17  2:44 PM  Result Value Ref Range Status   Specimen Description RIGHT ANTECUBITAL  Final   Special Requests   Final    BOTTLES DRAWN AEROBIC ONLY Blood Culture adequate volume   Culture  Setup Time   Final    GRAM POSITIVE COCCI Gram Stain Report Called to,Read Back By and Verified With: ROWE,C AT 0730 BY HUFFINES,S ON 06/06/17. AEROBIC BOTTLE ONLY    Culture (A)  Final    STAPHYLOCOCCUS AUREUS SUSCEPTIBILITIES PERFORMED ON PREVIOUS CULTURE WITHIN THE LAST 5 DAYS. Performed at [facility], [address], [place], [place] [address]    Report Status 06/08/2017 FINAL  Final    Blood culture (routine x 2)     Status: Abnormal   Collection Time: 06/05/17  2:47 PM  Result Value Ref Range Status   Specimen Description LEFT ANTECUBITAL  Final   Special Requests   Final    BOTTLES DRAWN AEROBIC AND ANAEROBIC Blood Culture adequate volume   Culture  Setup Time   Final    GRAM POSITIVE COCCI BOTH AEB AND ANA Gram Stain Report Called to,Read Back By and Verified With: J.CRUISE, RN @ [facility] ED @ 0545 ON 1.15.19 BY BOWMAN,L CRITICAL RESULT CALLED TO, READ BACK BY AND VERIFIED WITH: N. Batchelder Pharm.D. 8:55 06/06/17 ([email or web address]) Performed at [facility], [address] [place], [place] [address]    Culture STAPHYLOCOCCUS AUREUS (A)  Final   Report Status 06/08/2017 FINAL  Final   Organism ID, Bacteria STAPHYLOCOCCUS AUREUS  Final      Susceptibility   Staphylococcus aureus - MIC*    CIPROFLOXACIN <=0.5 SENSITIVE Sensitive     ERYTHROMYCIN <=0.25 SENSITIVE Sensitive     GENTAMICIN <=0.5 SENSITIVE Sensitive     OXACILLIN <=0.25 SENSITIVE Sensitive     TETRACYCLINE <=1 SENSITIVE Sensitive     VANCOMYCIN <=0.5 SENSITIVE Sensitive     TRIMETH/SULFA <=10 SENSITIVE Sensitive     CLINDAMYCIN <=0.25 SENSITIVE Sensitive     RIFAMPIN <=0.5 SENSITIVE Sensitive     Inducible Clindamycin NEGATIVE Sensitive     * STAPHYLOCOCCUS AUREUS  Blood Culture ID Panel (Reflexed)     Status: Abnormal   Collection Time: 06/05/17  2:47 PM  Result Value Ref Range Status   Enterococcus species NOT DETECTED NOT DETECTED Final   Listeria monocytogenes NOT DETECTED NOT DETECTED Final   Staphylococcus species DETECTED (A) NOT DETECTED Final    Comment:  CRITICAL RESULT CALLED TO, READ BACK BY AND VERIFIED WITH: N. Batchelder Pharm.D. 8:55 06/06/17 ([email or web address])    Staphylococcus aureus DETECTED (A) NOT DETECTED Final    Comment: Methicillin (oxacillin) susceptible Staphylococcus aureus (MSSA). Preferred therapy is anti staphylococcal beta lactam antibiotic (Cefazolin or Nafcillin), unless  clinically contraindicated. CRITICAL RESULT CALLED TO, READ BACK BY AND VERIFIED WITH: N. Batchelder Pharm.D. 8:55 06/06/17 ([email or web address])    Methicillin resistance NOT DETECTED NOT DETECTED Final   Streptococcus species NOT DETECTED NOT DETECTED Final   Streptococcus agalactiae NOT DETECTED NOT DETECTED Final   Streptococcus pneumoniae NOT DETECTED NOT DETECTED Final   Streptococcus pyogenes NOT DETECTED NOT DETECTED Final   Acinetobacter baumannii NOT DETECTED NOT DETECTED Final   Enterobacteriaceae species NOT DETECTED NOT DETECTED Final   Enterobacter cloacae complex NOT DETECTED NOT DETECTED Final   Escherichia coli NOT DETECTED NOT DETECTED Final   Klebsiella oxytoca NOT DETECTED NOT DETECTED Final   Klebsiella pneumoniae NOT DETECTED NOT DETECTED Final   Proteus species NOT DETECTED NOT DETECTED Final   Serratia marcescens NOT DETECTED NOT DETECTED Final   Haemophilus influenzae NOT DETECTED NOT DETECTED Final   Neisseria meningitidis NOT DETECTED NOT DETECTED Final   Pseudomonas aeruginosa NOT DETECTED NOT DETECTED Final   Candida albicans NOT DETECTED NOT DETECTED Final   Candida glabrata NOT DETECTED NOT DETECTED Final   Candida krusei NOT DETECTED NOT DETECTED Final   Candida parapsilosis NOT DETECTED NOT DETECTED Final   Candida tropicalis NOT DETECTED NOT DETECTED Final  Culture, blood (routine x 2)     Status: None (Preliminary result)   Collection Time: 06/06/17  1:45 PM  Result Value Ref Range Status   Specimen Description BLOOD RIGHT ANTECUBITAL  Final   Special Requests   Final    BOTTLES DRAWN AEROBIC AND ANAEROBIC Blood Culture adequate volume   Culture NO GROWTH 2 DAYS  Final   Report Status PENDING  Incomplete  Culture, blood (routine x 2)     Status: None (Preliminary result)   Collection Time: 06/06/17  1:54 PM  Result Value Ref Range Status   Specimen Description BLOOD LEFT ANTECUBITAL  Final   Special Requests   Final    BOTTLES DRAWN AEROBIC AND ANAEROBIC  Blood Culture adequate volume   Culture NO GROWTH 2 DAYS  Final   Report Status PENDING  Incomplete  Aerobic/Anaerobic Culture (surgical/deep wound)     Status: None (Preliminary result)   Collection Time: 06/06/17  4:09 PM  Result Value Ref Range Status   Specimen Description WOUND LEFT HIP  Final   Special Requests PT ON VANC ANCEF ROCEPHIN ZOSYN  Final   Gram Stain   Final    NO WBC SEEN NO SQUAMOUS EPITHELIAL CELLS SEEN NO ORGANISMS SEEN    Culture   Final    RARE STAPHYLOCOCCUS AUREUS SUSCEPTIBILITIES TO FOLLOW NO ANAEROBES ISOLATED; CULTURE IN PROGRESS FOR 5 DAYS    Report Status PENDING  Incomplete  Surgical pcr screen     Status: Abnormal   Collection Time: 06/06/17  8:38 PM  Result Value Ref Range Status   MRSA, PCR NEGATIVE NEGATIVE Final   Staphylococcus aureus POSITIVE (A) NEGATIVE Final    Comment: (NOTE) The Xpert SA Assay (FDA approved for NASAL specimens in patients 14 years of age and older), is one component of a comprehensive surveillance program. It is not intended to diagnose infection nor to guide or monitor treatment.   Culture, sputum-assessment     Status: None   Collection  Time: 06/07/17  9:14 AM  Result Value Ref Range Status   Specimen Description SPUTUM  Final   Special Requests NONE  Final   Sputum evaluation THIS SPECIMEN IS ACCEPTABLE FOR SPUTUM CULTURE  Final   Report Status 06/07/2017 FINAL  Final  Culture, respiratory (NON-Expectorated)     Status: None (Preliminary result)   Collection Time: 06/07/17  9:14 AM  Result Value Ref Range Status   Specimen Description SPUTUM  Final   Special Requests NONE Reflexed from [id number]  Final   Gram Stain   Final    FEW WBC PRESENT, PREDOMINANTLY PMN NO ORGANISMS SEEN    Culture CULTURE REINCUBATED FOR BETTER GROWTH  Final   Report Status PENDING  Incomplete  Aerobic/Anaerobic Culture (surgical/deep wound)     Status: None (Preliminary result)   Collection Time: 06/07/17  3:32 PM  Result Value Ref  Range Status   Specimen Description WOUND  Final   Special Requests CHEST WALL ABSCESS  Final   Gram Stain   Final    ABUNDANT WBC PRESENT, PREDOMINANTLY PMN MODERATE GRAM POSITIVE COCCI    Culture   Final    MODERATE STAPHYLOCOCCUS AUREUS SUSCEPTIBILITIES TO FOLLOW    Report Status PENDING  Incomplete    Radiology Reports Dg Chest 2 View  Result Date: 06/05/2017 CLINICAL DATA:  Shortness of Breath EXAM: CHEST  2 VIEW COMPARISON:  December 01, 2011 FINDINGS: There is patchy infiltrate in the lateral right base. Lungs elsewhere are clear. Heart size and pulmonary vascularity are normal. No adenopathy. No bone lesions. IMPRESSION: Infiltrate consistent with pneumonia lateral right base. Lungs elsewhere clear. Cardiac silhouette within normal limits. Electronically Signed   By: Bretta Bang III M.D.   On: 06/05/2017 14:47   Ct Soft Tissue Neck W Contrast  Result Date: 06/05/2017 CLINICAL DATA:  Neck mass.  Hard, red, raised area in the neck. EXAM: CT NECK WITH CONTRAST TECHNIQUE: Multidetector CT imaging of the neck was performed using the standard protocol following the bolus administration of intravenous contrast. CONTRAST:  ISOVUE-300 IOPAMIDOL (ISOVUE-300) INJECTION 61% COMPARISON:  None. FINDINGS: Pharynx and larynx: No pharyngeal or laryngeal mass. Patent airway. No retropharyngeal fluid collection. Salivary glands: No inflammation, mass, or stone. Thyroid: Mass effect on the right thyroid lobe by the inflammatory process described below. No intrinsic thyroid abnormality identified. Lymph nodes: Subcentimeter but asymmetric anterior and posterior cervical lymph nodes throughout the right neck, likely reactive. Vascular: Major vascular structures of the neck are patent. The below described inflammatory process extends into the right carotid space in the lower neck. Limited intracranial: Unremarkable. Visualized orbits: Not imaged. Mastoids and visualized paranasal sinuses: Partially  visualized polypoid mucosal thickening or small mucous retention cyst in the left maxillary sinus. Visualized mastoid air cells are clear. Skeleton: There is a heterogeneous gas and fluid collection along the medial aspect of the right sternocleidomastoid muscle in the lower neck beginning near the clavicular head. This collection extends posteriorly and inferiorly into the anterior mediastinum, more fully evaluated on the separate chest CT. Phlegmon extends superiorly within/along the right sternocleidomastoid muscle in the lower and mid neck. Fluid extends superiorly in the right neck deep to the strap muscles and contains a few locules of gas, however this fluid does not appear as organized as the collections in the lower neck and upper chest. Inflammation in the right neck extends just superior to the level of the thyroid cartilage. No erosive changes are seen at the right sternoclavicular joint. Upper chest: Reported separately.  Other: None. IMPRESSION: Gas and fluid collections in the anterior upper chest as reported on separate chest CT, concerning for abscesses. Phlegmon and less organized gas and fluid extend superiorly in the anterior right neck with suspected myositis of the right sternocleidomastoid muscle. Electronically Signed   By: Sebastian Ache M.D.   On: 06/05/2017 16:31   Ct Chest W Contrast  Result Date: 06/05/2017 CLINICAL DATA:  Patient with shortness of breath. Red raised area anterior lower neck. EXAM: CT CHEST WITH CONTRAST TECHNIQUE: Multidetector CT imaging of the chest was performed during intravenous contrast administration. CONTRAST:  ISOVUE-300 IOPAMIDOL (ISOVUE-300) INJECTION 61% COMPARISON:  Chest CT 06/21/2005; neck CT same day. FINDINGS: Cardiovascular: Normal heart size. No pericardial effusion. Normal caliber thoracic aorta. Enlarged main pulmonary artery (3.3 cm) as can be seen with pulmonary arterial hypertension. Mediastinum/Nodes: 1.2 cm AP window lymph node (image  50; series 2). 7 mm right paratracheal lymph node (image 50; series 2). Normal appearance of the esophagus. No axillary adenopathy. Lungs/Pleura: Central airways are patent. Subpleural consolidation with adjacent tree-in-bud ground-glass nodularity within the right lower lobe (image 114; series 4). Within the right upper lobe there multiple areas of regional ground-glass attenuation. No pleural effusion or pneumothorax. Upper Abdomen: No acute process. Musculoskeletal: There is a large centrally necrotic peripherally enhancing mass within the lower neck which extends from the anterior aspect of the right sternal clavicular joint/distal right sternocleidomastoid superiorly along the right aspect of the thyroid and inferiorly within the retrosternal location. The largest portion of this mass measures 4.7 x 4.1 cm. There are multiple small foci of gas within the mass. No definite osseous destruction demonstrated within the right sternoclavicular joint. IMPRESSION: 1. There is a large peripherally enhancing mass with central fluid and gas within the right lower neck extending from the anterior aspect of the right sternoclavicular location posteriorly along the retrosternal soft tissues. Findings are concerning for abscess. No definite osseous destruction at this time of the right sternoclavicular joint or adjacent costochondral joint however secondary infection of these joints is not excluded given the size of the suspected abscess. 2. Consolidation within the right lower lobe with right upper lobe ground-glass opacities favored to represent multifocal pneumonia. 3. Mediastinal and right hilar adenopathy likely reactive in etiology. Critical Value/emergent results were called by telephone at the time of interpretation on 06/05/2017 at 4:18 pm to Dr. Fayrene Fearing, who verbally acknowledged these results. Electronically Signed   By: Annia Belt M.D.   On: 06/05/2017 16:29   Mr Hip Left W Wo Contrast  Result Date:  06/06/2017 CLINICAL DATA:  Severe left hip pain for the past few weeks. History of IV drug abuse and recurrent skin and soft tissue infections. EXAM: MRI OF THE LEFT HIP WITHOUT AND WITH CONTRAST TECHNIQUE: Multiplanar, multisequence MR imaging was performed both before and after administration of intravenous contrast. CONTRAST:  15mL MULTIHANCE GADOBENATE DIMEGLUMINE 529 MG/ML IV SOLN COMPARISON:  None. FINDINGS: Bones: There is no evidence of acute fracture, dislocation or avascular necrosis. The visualized bony pelvis appears normal. The visualized sacroiliac joints and symphysis pubis appear normal. Articular cartilage and labrum Articular cartilage: No focal chondral defect or subchondral signal abnormality identified. Labrum: There is a tear of the left anterior superior labrum. Joint or bursal effusion Joint effusion: Small left hip joint effusion with prominent synovial enhancement. No right hip joint effusion. Bursae: Prominent, rim enhancing fluid within the left iliopsoas bursa, extending superiorly into the left iliacus muscle. The bursa measures up to 2.5 x  3.3 cm in maximal AP by TR dimension. No greater trochanteric bursal fluid. Muscles and tendons Muscles and tendons: Prominent muscle edema and enhancement of the left iliacus muscle. Mild myofascial edema along the left vastus and adductor compartments. The visualized gluteus, hamstring and iliopsoas tendons appear normal. The piriformis muscles appear symmetric. Other findings Miscellaneous: Trace presacral edema. The visualized internal pelvic contents otherwise appear unremarkable. IMPRESSION: 1. Findings consistent with left hip septic arthritis and left iliopsoas septic bursitis. The left iliopsoas bursa extends into the left iliacus muscle, which demonstrates prominent muscle edema and enhancement, consistent with infectious myositis. The preliminary findings were called by telephone at the time of interpretation on 06/06/2017 at 4:00 am to Dr.  Marcial Pacas OPYD, by Dr. Elgie Collard. Electronically Signed   By: Obie Dredge M.D.   On: 06/06/2017 08:15   Dg Chest Port 1 View  Result Date: 06/07/2017 CLINICAL DATA:  Shortness of breath, post debridement of RIGHT sternoclavicular abscess EXAM: PORTABLE CHEST 1 VIEW COMPARISON:  Portable exam 1639 hours compared to 06/05/2017 FINDINGS: Normal heart size, mediastinal contours, and pulmonary vascularity. Mild bronchitic changes. Question mild atelectasis versus infiltrate at medial RIGHT lung base. No additional infiltrate, pleural effusion or pneumothorax. No acute osseous findings. IMPRESSION: Mild chronic bronchitic changes with question minimal atelectasis versus infiltrate at medial RIGHT lung base. Electronically Signed   By: Ulyses Southward M.D.   On: 06/07/2017 17:47    Lab Data:  CBC: Recent Labs  Lab 06/05/17 1444 06/05/17 1457 06/06/17 0315 06/07/17 0210 06/08/17 0541  WBC 23.0*  --  19.7* 18.0* 21.5*  NEUTROABS 18.8*  --  15.1*  --   --   HGB 13.3 14.6 11.0* 11.2* 11.5*  HCT 39.4 43.0 33.1* 33.1* 34.6*  MCV 90.6  --  90.2 89.9 89.9  PLT 318  --  317 334 398   Basic Metabolic Panel: Recent Labs  Lab 06/05/17 1444 06/05/17 1457 06/06/17 0315 06/07/17 0210 06/08/17 0541  NA 137 135 134* 134* 136  K 3.7 3.6 4.0 3.6 4.1  CL 96* 95* 99* 97* 100*  CO2 27  --  24 27 24   GLUCOSE 109* 106* 82 123* 168*  BUN 9 6 7 6 10   CREATININE 0.66 0.70 0.58* 0.64 0.62  CALCIUM 9.0  --  8.1* 8.2* 8.6*   GFR: Estimated Creatinine Clearance: 128.2 mL/min (by C-G formula based on SCr of 0.62 mg/dL). Liver Function Tests: Recent Labs  Lab 06/05/17 1444 06/06/17 0315 06/07/17 0210  AST 67* 43* 41  ALT 63 47 39  ALKPHOS 201* 176* 185*  BILITOT 0.5 0.3 0.3  PROT 7.5 6.4* 5.6*  ALBUMIN 2.9* 2.3* 2.1*   No results for input(s): LIPASE, AMYLASE in the last 168 hours. No results for input(s): AMMONIA in the last 168 hours. Coagulation Profile: Recent Labs  Lab 06/07/17 0210   INR 1.11   Cardiac Enzymes: No results for input(s): CKTOTAL, CKMB, CKMBINDEX, TROPONINI in the last 168 hours. BNP (last 3 results) No results for input(s): PROBNP in the last 8760 hours. HbA1C: No results for input(s): HGBA1C in the last 72 hours. CBG: No results for input(s): GLUCAP in the last 168 hours. Lipid Profile: No results for input(s): CHOL, HDL, LDLCALC, TRIG, CHOLHDL, LDLDIRECT in the last 72 hours. Thyroid Function Tests: No results for input(s): TSH, T4TOTAL, FREET4, T3FREE, THYROIDAB in the last 72 hours. Anemia Panel: No results for input(s): VITAMINB12, FOLATE, FERRITIN, TIBC, IRON, RETICCTPCT in the last 72 hours. Urine analysis:  Component Value Date/Time   COLORURINE YELLOW 06/06/2017 1333   APPEARANCEUR CLEAR 06/06/2017 1333   LABSPEC 1.010 06/06/2017 1333   PHURINE 6.0 06/06/2017 1333   GLUCOSEU NEGATIVE 06/06/2017 1333   HGBUR NEGATIVE 06/06/2017 1333   BILIRUBINUR NEGATIVE 06/06/2017 1333   KETONESUR NEGATIVE 06/06/2017 1333   PROTEINUR NEGATIVE 06/06/2017 1333   UROBILINOGEN 0.2 10/03/2012 0856   NITRITE NEGATIVE 06/06/2017 1333   LEUKOCYTESUR NEGATIVE 06/06/2017 1333     Ashwini Jago M.D. [facility] 06/08/2017, 2:40 PM  Pager: [phone] Between 7am to 7pm - call Pager - [phone]  After 7pm go to [email or web address] - password [email or web address]  Call night coverage person covering after 7pm

## 2017-06-08 NOTE — Progress Notes (Signed)
[facility]   Reason for visit: Follow up on MRSA bactermia, abscess  Interval History: aspiration with Staph aureus, TTE done but no report yet, IR has not yet drained iliacus  Physical Exam: Constitutional:  Vitals:   06/07/17 2111 06/08/17 0517  BP: 115/60 136/74  Pulse: 84 67  Resp: 18 18  Temp: (!) 97.5 F (36.4 C) 97.6 F (36.4 C)  SpO2: 98% 99%   patient appears in NAD   Review of Systems: Constitutional: negative for fevers  Lab Results  Component Value Date   WBC 21.5 (H) 06/08/2017   HGB 11.5 (L) 06/08/2017   HCT 34.6 (L) 06/08/2017   MCV 89.9 06/08/2017   PLT 398 06/08/2017    Lab Results  Component Value Date   CREATININE 0.62 06/08/2017   BUN 10 06/08/2017   NA 136 06/08/2017   K 4.1 06/08/2017   CL 100 (L) 06/08/2017   CO2 24 06/08/2017    Lab Results  Component Value Date   ALT 39 06/07/2017   AST 41 06/07/2017   ALKPHOS 185 (H) 06/07/2017     Microbiology: Recent Results (from the past 240 hour(s))  Blood culture (routine x 2)     Status: Abnormal   Collection Time: 06/05/17  2:44 PM  Result Value Ref Range Status   Specimen Description RIGHT ANTECUBITAL  Final   Special Requests   Final    BOTTLES DRAWN AEROBIC ONLY Blood Culture adequate volume   Culture  Setup Time   Final    GRAM POSITIVE COCCI Gram Stain Report Called to,Read Back By and Verified With: ROWE,C AT 0730 BY HUFFINES,S ON 06/06/17. AEROBIC BOTTLE ONLY    Culture (A)  Final    STAPHYLOCOCCUS AUREUS SUSCEPTIBILITIES PERFORMED ON PREVIOUS CULTURE WITHIN THE LAST 5 DAYS. Performed at [facility], [address] [place], [place] [address]    Report Status 06/08/2017 FINAL  Final  Blood culture (routine x 2)     Status: Abnormal   Collection Time: 06/05/17  2:47 PM  Result Value Ref Range Status   Specimen Description LEFT ANTECUBITAL  Final   Special Requests   Final    BOTTLES DRAWN AEROBIC AND ANAEROBIC Blood Culture adequate  volume   Culture  Setup Time   Final    GRAM POSITIVE COCCI BOTH AEB AND ANA Gram Stain Report Called to,Read Back By and Verified With: J.CRUISE, RN @ [facility] ED @ 0545 ON 1.15.19 BY BOWMAN,L CRITICAL RESULT CALLED TO, READ BACK BY AND VERIFIED WITH: N. Batchelder Pharm.D. 8:55 06/06/17 ([email or web address]) Performed at [facility], [address], [place], [place] [address]    Culture STAPHYLOCOCCUS AUREUS (A)  Final   Report Status 06/08/2017 FINAL  Final   Organism ID, Bacteria STAPHYLOCOCCUS AUREUS  Final      Susceptibility   Staphylococcus aureus - MIC*    CIPROFLOXACIN <=0.5 SENSITIVE Sensitive     ERYTHROMYCIN <=0.25 SENSITIVE Sensitive     GENTAMICIN <=0.5 SENSITIVE Sensitive     OXACILLIN <=0.25 SENSITIVE Sensitive     TETRACYCLINE <=1 SENSITIVE Sensitive     VANCOMYCIN <=0.5 SENSITIVE Sensitive     TRIMETH/SULFA <=10 SENSITIVE Sensitive     CLINDAMYCIN <=0.25 SENSITIVE Sensitive     RIFAMPIN <=0.5 SENSITIVE Sensitive     Inducible Clindamycin NEGATIVE Sensitive     * STAPHYLOCOCCUS AUREUS  Blood Culture ID Panel (Reflexed)     Status: Abnormal   Collection Time: 06/05/17  2:47  PM  Result Value Ref Range Status   Enterococcus species NOT DETECTED NOT DETECTED Final   Listeria monocytogenes NOT DETECTED NOT DETECTED Final   Staphylococcus species DETECTED (A) NOT DETECTED Final    Comment: CRITICAL RESULT CALLED TO, READ BACK BY AND VERIFIED WITH: N. Batchelder Pharm.D. 8:55 06/06/17 ([email or web address])    Staphylococcus aureus DETECTED (A) NOT DETECTED Final    Comment: Methicillin (oxacillin) susceptible Staphylococcus aureus (MSSA). Preferred therapy is anti staphylococcal beta lactam antibiotic (Cefazolin or Nafcillin), unless clinically contraindicated. CRITICAL RESULT CALLED TO, READ BACK BY AND VERIFIED WITH: N. Batchelder Pharm.D. 8:55 06/06/17 (wilsonm)    Methicillin resistance NOT DETECTED NOT DETECTED Final   Streptococcus species NOT DETECTED NOT DETECTED Final    Streptococcus agalactiae NOT DETECTED NOT DETECTED Final   Streptococcus pneumoniae NOT DETECTED NOT DETECTED Final   Streptococcus pyogenes NOT DETECTED NOT DETECTED Final   Acinetobacter baumannii NOT DETECTED NOT DETECTED Final   Enterobacteriaceae species NOT DETECTED NOT DETECTED Final   Enterobacter cloacae complex NOT DETECTED NOT DETECTED Final   Escherichia coli NOT DETECTED NOT DETECTED Final   Klebsiella oxytoca NOT DETECTED NOT DETECTED Final   Klebsiella pneumoniae NOT DETECTED NOT DETECTED Final   Proteus species NOT DETECTED NOT DETECTED Final   Serratia marcescens NOT DETECTED NOT DETECTED Final   Haemophilus influenzae NOT DETECTED NOT DETECTED Final   Neisseria meningitidis NOT DETECTED NOT DETECTED Final   Pseudomonas aeruginosa NOT DETECTED NOT DETECTED Final   Candida albicans NOT DETECTED NOT DETECTED Final   Candida glabrata NOT DETECTED NOT DETECTED Final   Candida krusei NOT DETECTED NOT DETECTED Final   Candida parapsilosis NOT DETECTED NOT DETECTED Final   Candida tropicalis NOT DETECTED NOT DETECTED Final  Culture, blood (routine x 2)     Status: None (Preliminary result)   Collection Time: 06/06/17  1:45 PM  Result Value Ref Range Status   Specimen Description BLOOD RIGHT ANTECUBITAL  Final   Special Requests   Final    BOTTLES DRAWN AEROBIC AND ANAEROBIC Blood Culture adequate volume   Culture NO GROWTH 2 DAYS  Final   Report Status PENDING  Incomplete  Culture, blood (routine x 2)     Status: None (Preliminary result)   Collection Time: 06/06/17  1:54 PM  Result Value Ref Range Status   Specimen Description BLOOD LEFT ANTECUBITAL  Final   Special Requests   Final    BOTTLES DRAWN AEROBIC AND ANAEROBIC Blood Culture adequate volume   Culture NO GROWTH 2 DAYS  Final   Report Status PENDING  Incomplete  Aerobic/Anaerobic Culture (surgical/deep wound)     Status: None (Preliminary result)   Collection Time: 06/06/17  4:09 PM  Result Value Ref Range  Status   Specimen Description WOUND LEFT HIP  Final   Special Requests PT ON VANC ANCEF ROCEPHIN ZOSYN  Final   Gram Stain   Final    NO WBC SEEN NO SQUAMOUS EPITHELIAL CELLS SEEN NO ORGANISMS SEEN    Culture   Final    RARE STAPHYLOCOCCUS AUREUS SUSCEPTIBILITIES TO FOLLOW NO ANAEROBES ISOLATED; CULTURE IN PROGRESS FOR 5 DAYS    Report Status PENDING  Incomplete  Surgical pcr screen     Status: Abnormal   Collection Time: 06/06/17  8:38 PM  Result Value Ref Range Status   MRSA, PCR NEGATIVE NEGATIVE Final   Staphylococcus aureus POSITIVE (A) NEGATIVE Final    Comment: (NOTE) The Xpert SA Assay (FDA approved for NASAL specimens in patients 22 years  of age and older), is one component of a comprehensive surveillance program. It is not intended to diagnose infection nor to guide or monitor treatment.   Culture, sputum-assessment     Status: None   Collection Time: 06/07/17  9:14 AM  Result Value Ref Range Status   Specimen Description SPUTUM  Final   Special Requests NONE  Final   Sputum evaluation THIS SPECIMEN IS ACCEPTABLE FOR SPUTUM CULTURE  Final   Report Status 06/07/2017 FINAL  Final  Culture, respiratory (NON-Expectorated)     Status: None (Preliminary result)   Collection Time: 06/07/17  9:14 AM  Result Value Ref Range Status   Specimen Description SPUTUM  Final   Special Requests NONE Reflexed from [id number]  Final   Gram Stain   Final    FEW WBC PRESENT, PREDOMINANTLY PMN NO ORGANISMS SEEN    Culture CULTURE REINCUBATED FOR BETTER GROWTH  Final   Report Status PENDING  Incomplete  Aerobic/Anaerobic Culture (surgical/deep wound)     Status: None (Preliminary result)   Collection Time: 06/07/17  3:32 PM  Result Value Ref Range Status   Specimen Description WOUND  Final   Special Requests CHEST WALL ABSCESS  Final   Gram Stain   Final    ABUNDANT WBC PRESENT, PREDOMINANTLY PMN MODERATE GRAM POSITIVE COCCI    Culture   Final    MODERATE STAPHYLOCOCCUS  AUREUS SUSCEPTIBILITIES TO FOLLOW    Report Status PENDING  Incomplete    Impression/Plan:  1. MRSA bacteremia, abscess, septic arthritis - on cefazolin.  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  He can go to SNF.  Repeat cultures ngtd.   Will await TTE.  Would treat 6 weeks for above.

## 2017-06-08 NOTE — Progress Notes (Signed)
Subjective: Pt stable - pain ok   Objective: Vital signs in last 24 hours: Temp:  [97.5 F (36.4 C)-98.4 F (36.9 C)] 97.6 F (36.4 C) (01/17 0517) Pulse Rate:  [67-103] 67 (01/17 0517) Resp:  [18-22] 18 (01/17 0517) BP: (115-144)/(60-81) 136/74 (01/17 0517) SpO2:  [95 %-99 %] 99 % (01/17 0517)  Intake/Output from previous day: 01/16 0701 - 01/17 0700 In: 2696.5 [P.O.:360; I.V.:2036.5; IV Piggyback:300] Out: 95 [Drains:65; Blood:30] Intake/Output this shift: No intake/output data recorded.  Exam:  No cellulitis present  Labs: Recent Labs    06/05/17 1444 06/05/17 1457 06/06/17 0315 06/07/17 0210 06/08/17 0541  HGB 13.3 14.6 11.0* 11.2* 11.5*   Recent Labs    06/07/17 0210 06/08/17 0541  WBC 18.0* 21.5*  RBC 3.68* 3.85*  HCT 33.1* 34.6*  PLT 334 398   Recent Labs    06/07/17 0210 06/08/17 0541  NA 134* 136  K 3.6 4.1  CL 97* 100*  CO2 27 24  BUN 6 10  CREATININE 0.64 0.62  GLUCOSE 123* 168*  CALCIUM 8.2* 8.6*   Recent Labs    06/07/17 0210  INR 1.11    Assessment/Plan: Plan for dc from ortho perspective on 6 w iv abx  but he needs ir consult for iliacus fluid collection drainage before dc   Clifford Tucker 06/08/2017, 11:38 AM

## 2017-06-08 NOTE — Progress Notes (Signed)
Patient refused ABG, stated he just wanted to rest and after an ABG he cannot go to sleep. No distress noted at this time, RCP will continue to monitor.

## 2017-06-08 NOTE — Progress Notes (Signed)
  Echocardiogram 2D Echocardiogram has been performed.  Trude Cansler L Androw 03/29/2018, 12:16 PM 

## 2017-06-08 NOTE — Care Management Note (Signed)
Case Management Note  Patient Details  Name: Lance BoschChristopher Folk MRN: [id number] Date of Birth: 1981/03/29  Subjective/Objective:                    Action/Plan:  Awaiting cultures. History of IV drug use.   Home VAC application placed on shadow chart for MD signature will also need wound measurements.   If patient discharges home on PO  antibiotics can provide MATCH letter.  Closer to discharge [facility] or [facility].  Will continue to follow.  Expected Discharge Date:                  Expected Discharge Plan:  Home w Home Health Services  In-House Referral:     Discharge planning Services  CM Consult  Post Acute Care Choice:    Choice offered to:     DME Arranged:  Vac DME Agency:  [facility]  HH Arranged:    HH Agency:     Status of Service:  In process, will continue to follow  If discussed at Long Length of Stay Meetings, dates discussed:    Additional Comments:  [name], Veola Cafaro Marie, RN 06/08/2017, 9:55 AM

## 2017-06-09 ENCOUNTER — Inpatient Hospital Stay (HOSPITAL_COMMUNITY): Payer: Self-pay

## 2017-06-09 DIAGNOSIS — B9561 Methicillin susceptible Staphylococcus aureus infection as the cause of diseases classified elsewhere: Secondary | ICD-10-CM

## 2017-06-09 LAB — CULTURE, RESPIRATORY W GRAM STAIN

## 2017-06-09 LAB — CBC
HCT: 36.4 % — ABNORMAL LOW (ref 39.0–52.0)
Hemoglobin: 12.2 g/dL — ABNORMAL LOW (ref 13.0–17.0)
MCH: 30 pg (ref 26.0–34.0)
MCHC: 33.5 g/dL (ref 30.0–36.0)
MCV: 89.4 fL (ref 78.0–100.0)
PLATELETS: 500 10*3/uL — AB (ref 150–400)
RBC: 4.07 MIL/uL — AB (ref 4.22–5.81)
RDW: 14.9 % (ref 11.5–15.5)
WBC: 19.4 10*3/uL — ABNORMAL HIGH (ref 4.0–10.5)

## 2017-06-09 LAB — TYPE AND SCREEN
ABO/RH(D): O NEG
Antibody Screen: NEGATIVE
Unit division: 0
Unit division: 0
Unit division: 0

## 2017-06-09 LAB — BPAM RBC
Blood Product Expiration Date: 201901262359
Blood Product Expiration Date: 201901262359
Blood Product Expiration Date: 201901262359
ISSUE DATE / TIME: 201901152318
ISSUE DATE / TIME: 201901161528
Unit Type and Rh: 9500
Unit Type and Rh: 9500
Unit Type and Rh: 9500

## 2017-06-09 LAB — COMPREHENSIVE METABOLIC PANEL
ALT: 36 U/L (ref 17–63)
AST: 42 U/L — ABNORMAL HIGH (ref 15–41)
Albumin: 2.6 g/dL — ABNORMAL LOW (ref 3.5–5.0)
Alkaline Phosphatase: 165 U/L — ABNORMAL HIGH (ref 38–126)
Anion gap: 11 (ref 5–15)
BUN: 13 mg/dL (ref 6–20)
CO2: 22 mmol/L (ref 22–32)
Calcium: 8.8 mg/dL — ABNORMAL LOW (ref 8.9–10.3)
Chloride: 102 mmol/L (ref 101–111)
Creatinine, Ser: 0.67 mg/dL (ref 0.61–1.24)
GFR calc Af Amer: >60 mL/min (ref >60)
GFR calc non Af Amer: >60 mL/min (ref >60)
Glucose, Bld: 159 mg/dL — ABNORMAL HIGH (ref 65–99)
Potassium: 3.8 mmol/L (ref 3.5–5.1)
Sodium: 135 mmol/L (ref 135–145)
Total Bilirubin: 0.6 mg/dL (ref 0.3–1.2)
Total Protein: 6.6 g/dL (ref 6.5–8.1)

## 2017-06-09 LAB — CULTURE, RESPIRATORY

## 2017-06-09 MED ORDER — FENTANYL CITRATE (PF) 100 MCG/2ML IJ SOLN
INTRAMUSCULAR | Status: AC | PRN
  Administered 2017-06-09: 50 ug via INTRAVENOUS
  Administered 2017-06-09 (×3): 25 ug via INTRAVENOUS
  Administered 2017-06-09: 50 ug via INTRAVENOUS
  Administered 2017-06-09: 25 ug via INTRAVENOUS

## 2017-06-09 MED ORDER — SODIUM CHLORIDE 0.9% FLUSH
5.0000 mL | Freq: Three times a day (TID) | INTRAVENOUS | Status: DC
  Administered 2017-06-09 – 2017-06-15 (×10): 5 mL via INTRAVENOUS

## 2017-06-09 MED ORDER — LIDOCAINE HCL 1 % IJ SOLN
INTRAMUSCULAR | Status: AC
  Filled 2017-06-09: qty 20

## 2017-06-09 MED ORDER — MIDAZOLAM HCL 2 MG/2ML IJ SOLN
INTRAMUSCULAR | Status: AC
  Filled 2017-06-09: qty 6

## 2017-06-09 MED ORDER — FENTANYL CITRATE (PF) 100 MCG/2ML IJ SOLN
INTRAMUSCULAR | Status: AC
  Filled 2017-06-09: qty 6

## 2017-06-09 MED ORDER — MIDAZOLAM HCL 2 MG/2ML IJ SOLN
INTRAMUSCULAR | Status: AC | PRN
  Administered 2017-06-09: 0.5 mg via INTRAVENOUS
  Administered 2017-06-09 (×4): 1 mg via INTRAVENOUS

## 2017-06-09 MED ORDER — SODIUM CHLORIDE 0.9 % IV SOLN
INTRAVENOUS | Status: AC | PRN
  Administered 2017-06-09: 10 mL/h via INTRAVENOUS

## 2017-06-09 NOTE — Procedures (Signed)
Interventional Radiology Procedure Note  Procedure: 6F drain placed into left iliacus abscess.  Aspirated 30 mL purulent material  Complications: None  Estimated Blood Loss: None  Recommendations: - Drain to JP bulb - Flush every shift  Signed,  Sterling BigHeath K. Kerryann Allaire, MD

## 2017-06-09 NOTE — Care Management Note (Signed)
Case Management Note  Patient Details  Name: Clifford Tucker MRN: [id number] Date of Birth: 1980-08-04  Subjective/Objective:                    Action/Plan:  History of IVDU, needs IV ABX x 6 weeks and VAC , SW consulted  Expected Discharge Date:                  Expected Discharge Plan:  Skilled Nursing Facility  In-House Referral:  Clinical Social Work  Discharge planning Services  CM Consult  Post Acute Care Choice:    Choice offered to:     DME Arranged:  Vac DME Agency:  NA  HH Arranged:  IV Antibiotics HH Agency:     Status of Service:  In process, will continue to follow  If discussed at Long Length of Stay Meetings, dates discussed:    Additional Comments:  [name], Kaleigh Spiegelman Marie, RN 06/09/2017, 10:23 AM

## 2017-06-09 NOTE — Consult Note (Addendum)
WOC Nurse wound consult note Reason for Consult: Pt is followed by the CT service for assessment and plan of care.  Requested to change sternoclavicular Vac dressing. Pt has been walking in the hall with the Vac machine/tubing disconnected.  Reviewed importance of maintaining a closed system to avoid infection. Wound type: Full thickness post-op wound Measurement:3X6X2.8cm Wound bed: 20% yellow to inner wound bed, 80% pale red.  Exposed bone Drainage (amount, consistency, odor) small amt dark red drainage, no odor Periwound: intact skin surrounding Dressing procedure/placement/frequency: Applied one piece white foam, then one piece black foam to 100mm cont suction.  Pt tolerated with mod amt discomfort, denies need for pain meds.  Plan for bedside nurse to change Vac dressing Q M/W/F. Discussed plan of care with patient and he verbalized understanding. Please re-consult if further assistance is needed.  Thank-you,  Cammie Mcgeeawn Neftali Abair MSN, RN, CWOCN, [place], CNS [phone]

## 2017-06-09 NOTE — NC FL2 (Signed)
[place] MEDICAID FL2 LEVEL OF CARE SCREENING TOOL     IDENTIFICATION  Patient Name: Clifford Tucker Birthdate: December 27, 1980 Sex: male Admission Date (Current Location): 06/05/2017  [facility] [place]   Facility and Address:  [facility], [address], [place], [place] [address]      Provider Number: [id number]  Attending Physician Name and Address:  Cathren Harshai, Ripudeep K, MD  Relative Name and Phone Number:       Current Level of Care: Hospital Recommended Level of Care: Skilled Nursing Facility Prior Approval Number:    Date Approved/Denied:   PASRR Number:    Discharge Plan: SNF    Current Diagnoses: Patient Active Problem List   Diagnosis Date Noted  . Bacteremia due to methicillin susceptible Staphylococcus aureus (MSSA) 06/06/2017  . Septic arthritis of sternoclavicular joint, right (HCC) 06/06/2017  . 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 Normocytic anemia 06/06/2017  . Septic arthritis of hip (HCC) 06/06/2017  . Neck abscess 06/05/2017  . IV drug user 06/05/2017  . Left hip pain 06/05/2017  . Multifocal pneumonia     Orientation RESPIRATION BLADDER Height & Weight     Self, Time, Situation, Place  Normal Continent Weight: 154 lb 15.7 oz (70.3 kg) Height:  5\' 10"  (177.8 cm)  BEHAVIORAL SYMPTOMS/MOOD NEUROLOGICAL BOWEL NUTRITION STATUS      Continent Diet  AMBULATORY STATUS COMMUNICATION OF NEEDS Skin   Supervision Verbally Surgical wounds, Wound Vac(100 MM/HG changed MWFri; R sternal clavicle)   (Wound on L Hip)                   Personal Care Assistance Level of Assistance  Bathing, Feeding, Dressing Bathing Assistance: Independent Feeding assistance: Independent Dressing Assistance: Independent     Functional Limitations Info  Sight, Hearing, Speech Sight Info: Adequate Hearing Info: Adequate Speech Info: Adequate    SPECIAL CARE FACTORS FREQUENCY  PT (By licensed PT), OT (By licensed  OT)     PT Frequency: 3x week OT Frequency: 3x week            Contractures Contractures Info: Not present    Additional Factors Info  Code Status, Allergies Code Status Info: Full Code Allergies Info: No Known Allergies           Current Medications (06/09/2017):  This is the current hospital active medication list Current Facility-Administered Medications  Medication Dose Route Frequency Provider Last Rate Last Dose  . 0.9 %  sodium chloride infusion  10 mL/hr Intravenous Once Burt Ekurner, Samantha Ashley, CRNA      . acetaminophen (TYLENOL) tablet 1,000 mg  1,000 mg Oral Q6H Donata ClayVan Trigt, Theron AristaPeter, MD   1,000 mg at 06/09/17 0548   Or  . acetaminophen (TYLENOL) solution 1,000 mg  1,000 mg Oral Q6H Donata ClayVan Trigt, Theron AristaPeter, MD      . acetaminophen (TYLENOL) tablet 650 mg  650 mg Oral Q6H PRN Opyd, Lavone Neriimothy S, MD       Or  . acetaminophen (TYLENOL) suppository 650 mg  650 mg Rectal Q6H PRN Opyd, Lavone Neriimothy S, MD      . ceFAZolin (ANCEF) IVPB 2g/100 mL premix  2 g Intravenous Q8H Narda BondsNettey, Ralph A, MD 200 mL/hr at 06/09/17 1202 2 g at 06/09/17 1202  . Chlorhexidine Gluconate Cloth 2 % PADS 6 each  6 each Topical Daily Cammy Copaean, Gregory Scott, MD   6 each at 06/09/17 1209  . dextrose 5 %-0.45 % sodium chloride infusion  Intravenous Continuous Kerin Perna, MD 30 mL/hr at 06/07/17 2000    . diphenhydrAMINE (BENADRYL) capsule 25 mg  25 mg Oral Q6H PRN Rai, Ripudeep K, MD   25 mg at 06/09/17 0007  . enoxaparin (LOVENOX) injection 40 mg  40 mg Subcutaneous Q24H Loyce Dys Sue-Ellen, PA      . fentaNYL (SUBLIMAZE) injection 25-50 mcg  25-50 mcg Intravenous Q2H PRN Donata Clay, Theron Arista, MD      . HYDROmorphone (DILAUDID) injection 1 mg  1 mg Intravenous Q4H PRN Rai, Ripudeep K, MD   1 mg at 06/09/17 0837  . iopamidol (ISOVUE-M) 41 % intrathecal injection 20 mL  20 mL Intra-articular Once Freeman Caldron, PA-C      . ketorolac (TORADOL) 30 MG/ML injection 30 mg  30 mg Intravenous Q6H PRN Opyd, Lavone Neri, MD    30 mg at 06/07/17 1900  . lactated ringers infusion   Intravenous Continuous Leilani Able, MD 10 mL/hr at 06/06/17 1406    . lactated ringers infusion   Intravenous Continuous Ellender, Catheryn Bacon, MD 10 mL/hr at 06/07/17 1354    . lidocaine (PF) (XYLOCAINE) 1 % injection 5 mL  5 mL Intradermal Once Freeman Caldron, PA-C      . metoCLOPramide (REGLAN) tablet 5-10 mg  5-10 mg Oral Q8H PRN Cammy Copa, MD       Or  . metoCLOPramide (REGLAN) injection 5-10 mg  5-10 mg Intravenous Q8H PRN Cammy Copa, MD      . mupirocin ointment (BACTROBAN) 2 % 1 application  1 application Nasal BID Cammy Copa, MD   1 application at 06/08/17 2214  . ondansetron (ZOFRAN) tablet 4 mg  4 mg Oral Q6H PRN Opyd, Lavone Neri, MD       Or  . ondansetron (ZOFRAN) injection 4 mg  4 mg Intravenous Q6H PRN Opyd, Lavone Neri, MD   4 mg at 06/06/17 0740  . oxyCODONE (Oxy IR/ROXICODONE) immediate release tablet 5-10 mg  5-10 mg Oral Q4H PRN Kerin Perna, MD   10 mg at 06/09/17 1201  . potassium chloride 10 mEq in 50 mL *CENTRAL LINE* IVPB  10 mEq Intravenous Daily PRN Donata Clay, Theron Arista, MD      . senna-docusate (Senokot-S) tablet 1 tablet  1 tablet Oral QHS PRN Opyd, Lavone Neri, MD      . sodium chloride 0.9 % injection 10 mL  10 mL Intravenous Once Freeman Caldron, PA-C      . zolpidem (AMBIEN) tablet 5 mg  5 mg Oral QHS PRN Rai, Ripudeep K, MD   5 mg at 06/08/17 2214     Discharge Medications: Please see discharge summary for a list of discharge medications.  Relevant Imaging Results:  Relevant Lab Results:   Additional Information SS# [id number]; IV ancef every 8 hours; wound vac   Doy Hutching, LCSWA

## 2017-06-09 NOTE — Progress Notes (Signed)
[facility]                                                                              Patient Demographics  Clifford Tucker, is a 37 y.o. male, DOB - Oct 31, 1980, ZOX:[id number]  Admit date - 06/05/2017   Admitting Physician Briscoe Deutscher, MD  Outpatient Primary MD for the patient is Patient, No Pcp Per  Outpatient specialists:   LOS - 4  days   Medical records reviewed and are as summarized below:    Chief Complaint  Patient presents with  . Oral Swelling       Brief summary   Clifford Tucker is a 37 y.o. malewith medical history significant forIV drug abuse and recurrent skin and soft tissue infections. He presented with neck/hip pain found to have a neck abscess, septic arthritis of the hip and now MSSA bacteremia. CT surgery, orthopedic surgery and infectious disease consulted.   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  Reported having history of multiple abscesses over the last few weeks -Patient was originally placed on vancomycin and Zosyn, transition to IV Ancef -Sternal aspirate showing moderate staph aureus, left hip wound cultures showing rare staph aureus, sensitivities pending -2D echo with no vegetation.  ID following, recommended continue cefazolin  Active Problems:   Multifocal pneumonia -ID following, continue cefazolin  Left hip abscess/iliopsoas septic arthritis, infectious myositis -Status post left hip I&D with cultures done on 1/15, showing staph aureus -To new IV Ancef -IR drainage planned today for iliacus fluid collection   Septic arthritis of the sternoclavicular joint, abscess -CTVS following, underwent debridement with wound VAC of the neck abscess, plan for VAC change today by wound care    IV drug  user -Per patient, has been using heroin, [facility] but relapsed a month ago  -Last heroin use a week ago, so far not in any withdrawals, monitor closely -Social work consulted for rehab   Code Status: Full code DVT Prophylaxis: Lovenox Family Communication: Discussed in detail with the patient, all imaging results, lab results explained to the patient, his mother and girlfriend in the room   Disposition Plan:   Time Spent in minutes   25 minutes  Procedures:  I&D left hip 1/15  Consultants:   Orthopedics CT VS ID  Antimicrobials:      Medications  Scheduled Meds: . acetaminophen  1,000 mg Oral Q6H   Or  . acetaminophen (TYLENOL) oral liquid 160 mg/5 mL  1,000 mg Oral Q6H  . Chlorhexidine Gluconate Cloth  6 each Topical Daily  . enoxaparin (LOVENOX) injection  40 mg Subcutaneous Q24H  . iopamidol  20 mL Intra-articular Once  . lidocaine (PF)  5 mL Intradermal Once  . mupirocin ointment  1 application Nasal BID  . sodium chloride  10 mL Intravenous Once   Continuous Infusions: . sodium chloride    .  ceFAZolin (ANCEF) IV 2  g (06/09/17 1202)  . dextrose 5 % and 0.45% NaCl 30 mL/hr at 06/07/17 2000  . lactated ringers 10 mL/hr at 06/06/17 1406  . lactated ringers 10 mL/hr at 06/07/17 1354  . potassium chloride     PRN Meds:.acetaminophen **OR** acetaminophen, diphenhydrAMINE, fentaNYL (SUBLIMAZE) injection, HYDROmorphone (DILAUDID) injection, ketorolac, metoCLOPramide **OR** metoCLOPramide (REGLAN) injection, ondansetron **OR** ondansetron (ZOFRAN) IV, oxyCODONE, potassium chloride, senna-docusate, zolpidem   Antibiotics   Anti-infectives (From admission, onward)   Start     Dose/Rate Route Frequency Ordered Stop   06/07/17 1400  cefUROXime (ZINACEF) 1.5 g in dextrose 5 % 50 mL IVPB     1.5 g 100 mL/hr over 30 Minutes Intravenous To [facility]Stay Surgical 06/07/17 0125 06/07/17 1527   06/07/17 0745  vancomycin (VANCOCIN) 1,000 mg in sodium  chloride 0.9 % 1,000 mL irrigation      Irrigation To Surgery 06/07/17 0744 06/07/17 1513   06/06/17 1639  gentamicin (GARAMYCIN) injection  Status:  Discontinued       As needed 06/06/17 1640 06/06/17 1715   06/06/17 1638  vancomycin (VANCOCIN) powder  Status:  Discontinued       As needed 06/06/17 1639 06/06/17 1715   06/06/17 1000  ceFAZolin (ANCEF) IVPB 2g/100 mL premix     2 g 200 mL/hr over 30 Minutes Intravenous Every 8 hours 06/06/17 0911     06/06/17 0000  vancomycin (VANCOCIN) IVPB 1000 mg/200 mL premix  Status:  Discontinued     1,000 mg 200 mL/hr over 60 Minutes Intravenous Every 8 hours 06/05/17 2211 06/06/17 0911   06/05/17 2330  piperacillin-tazobactam (ZOSYN) IVPB 3.375 g  Status:  Discontinued     3.375 g 12.5 mL/hr over 240 Minutes Intravenous Every 8 hours 06/05/17 2211 06/06/17 0911   06/05/17 2200  cefTRIAXone (ROCEPHIN) 1 g in dextrose 5 % 50 mL IVPB  Status:  Discontinued     1 g 100 mL/hr over 30 Minutes Intravenous Every 24 hours 06/05/17 2140 06/05/17 2202   06/05/17 2200  azithromycin (ZITHROMAX) 500 mg in dextrose 5 % 250 mL IVPB  Status:  Discontinued     500 mg 250 mL/hr over 60 Minutes Intravenous Every 24 hours 06/05/17 2140 06/06/17 0911   06/05/17 1600  vancomycin (VANCOCIN) 1,500 mg in sodium chloride 0.9 % 500 mL IVPB     1,500 mg 250 mL/hr over 120 Minutes Intravenous  Once 06/05/17 1542 06/05/17 1855   06/05/17 1515  piperacillin-tazobactam (ZOSYN) IVPB 3.375 g     3.375 g 100 mL/hr over 30 Minutes Intravenous  Once 06/05/17 1512 06/05/17 1557        Subjective:   Clifford Tucker was seen and examined today.  Feels better, pain controlled.  Had bowel movements yesterday. Patient denies dizziness, chest pain, shortness of breath, abdominal pain, N/V/D/C, new weakness, numbess, tingling. No acute events overnight.  No fevers.  Objective:   Vitals:   06/08/17 0517 06/08/17 1615 06/08/17 2135 06/09/17 0507  BP: 136/74 (!) 113/42 (!) 151/83  (!) 146/89  Pulse: 67 72 66 60  Resp: 18 18 18 18   Temp: 97.6 F (36.4 C) 98.9 F (37.2 C) 98.9 F (37.2 C) 98.6 F (37 C)  TempSrc: Oral Oral Oral Oral  SpO2: 99% 99% 100% 100%  Weight:      Height:        Intake/Output Summary (Last 24 hours) at 06/09/2017 1415 Last data filed at 06/09/2017 1018 Gross per 24 hour  Intake 1135.84 ml  Output 1 ml  Net 1134.84 ml     Wt Readings from Last 3 Encounters:  06/06/17 70.3 kg (154 lb 15.7 oz)  10/03/12 72.6 kg (160 lb)     Exam    General: Alert and oriented x 3, NAD  Eyes:  HEENT: wound VAC+  Cardiovascular: S1 S2 auscultated, Regular rate and rhythm. No pedal edema b/l  Respiratory: Clear to auscultation bilaterally, no wheezing, rales or rhonchi  Gastrointestinal: Soft, nontender, nondistended, + bowel sounds  Ext: no pedal edema bilaterally  Neuro: no new deficits  Musculoskeletal: No digital cyanosis, clubbing  Skin: No rashes  Psych: Normal affect and demeanor, alert and oriented x3    Data Reviewed:  I have personally reviewed following labs and imaging studies  Micro Results Recent Results (from the past 240 hour(s))  Blood culture (routine x 2)     Status: Abnormal   Collection Time: 06/05/17  2:44 PM  Result Value Ref Range Status   Specimen Description RIGHT ANTECUBITAL  Final   Special Requests   Final    BOTTLES DRAWN AEROBIC ONLY Blood Culture adequate volume   Culture  Setup Time   Final    GRAM POSITIVE COCCI Gram Stain Report Called to,Read Back By and Verified With: ROWE,C AT 0730 BY HUFFINES,S ON 06/06/17. AEROBIC BOTTLE ONLY    Culture (A)  Final    STAPHYLOCOCCUS AUREUS SUSCEPTIBILITIES PERFORMED ON PREVIOUS CULTURE WITHIN THE LAST 5 DAYS. Performed at [facility], [address], [place], [place] [address]    Report Status 06/08/2017 FINAL  Final  Blood culture (routine x 2)     Status: Abnormal   Collection Time: 06/05/17  2:47 PM  Result Value Ref Range Status    Specimen Description LEFT ANTECUBITAL  Final   Special Requests   Final    BOTTLES DRAWN AEROBIC AND ANAEROBIC Blood Culture adequate volume   Culture  Setup Time   Final    GRAM POSITIVE COCCI BOTH AEB AND ANA Gram Stain Report Called to,Read Back By and Verified With: J.CRUISE, RN @ [facility] @ 0545 ON 1.15.19 BY BOWMAN,L CRITICAL RESULT CALLED TO, READ BACK BY AND VERIFIED WITH: N. Batchelder Pharm.D. 8:55 06/06/17 ([email or web address]) Performed at [facility], [address], [place], [place] [address]    Culture STAPHYLOCOCCUS AUREUS (A)  Final   Report Status 06/08/2017 FINAL  Final   Organism ID, Bacteria STAPHYLOCOCCUS AUREUS  Final      Susceptibility   Staphylococcus aureus - MIC*    CIPROFLOXACIN <=0.5 SENSITIVE Sensitive     ERYTHROMYCIN <=0.25 SENSITIVE Sensitive     GENTAMICIN <=0.5 SENSITIVE Sensitive     OXACILLIN <=0.25 SENSITIVE Sensitive     TETRACYCLINE <=1 SENSITIVE Sensitive     VANCOMYCIN <=0.5 SENSITIVE Sensitive     TRIMETH/SULFA <=10 SENSITIVE Sensitive     CLINDAMYCIN <=0.25 SENSITIVE Sensitive     RIFAMPIN <=0.5 SENSITIVE Sensitive     Inducible Clindamycin NEGATIVE Sensitive     * STAPHYLOCOCCUS AUREUS  Blood Culture ID Panel (Reflexed)     Status: Abnormal   Collection Time: 06/05/17  2:47 PM  Result Value Ref Range Status   Enterococcus species NOT DETECTED NOT DETECTED Final   Listeria monocytogenes NOT DETECTED NOT DETECTED Final   Staphylococcus species DETECTED (A) NOT DETECTED Final    Comment: CRITICAL RESULT CALLED TO, READ BACK BY AND VERIFIED WITH: N. Batchelder Pharm.D. 8:55 06/06/17 ([email or web address])    Staphylococcus aureus DETECTED (A) NOT DETECTED Final  Comment: Methicillin (oxacillin) susceptible Staphylococcus aureus (MSSA). Preferred therapy is anti staphylococcal beta lactam antibiotic (Cefazolin or Nafcillin), unless clinically contraindicated. CRITICAL RESULT CALLED TO, READ BACK BY AND VERIFIED WITH: N. Batchelder Pharm.D. 8:55 06/06/17  ([email or web address])    Methicillin resistance NOT DETECTED NOT DETECTED Final   Streptococcus species NOT DETECTED NOT DETECTED Final   Streptococcus agalactiae NOT DETECTED NOT DETECTED Final   Streptococcus pneumoniae NOT DETECTED NOT DETECTED Final   Streptococcus pyogenes NOT DETECTED NOT DETECTED Final   Acinetobacter baumannii NOT DETECTED NOT DETECTED Final   Enterobacteriaceae species NOT DETECTED NOT DETECTED Final   Enterobacter cloacae complex NOT DETECTED NOT DETECTED Final   Escherichia coli NOT DETECTED NOT DETECTED Final   Klebsiella oxytoca NOT DETECTED NOT DETECTED Final   Klebsiella pneumoniae NOT DETECTED NOT DETECTED Final   Proteus species NOT DETECTED NOT DETECTED Final   Serratia marcescens NOT DETECTED NOT DETECTED Final   Haemophilus influenzae NOT DETECTED NOT DETECTED Final   Neisseria meningitidis NOT DETECTED NOT DETECTED Final   Pseudomonas aeruginosa NOT DETECTED NOT DETECTED Final   Candida albicans NOT DETECTED NOT DETECTED Final   Candida glabrata NOT DETECTED NOT DETECTED Final   Candida krusei NOT DETECTED NOT DETECTED Final   Candida parapsilosis NOT DETECTED NOT DETECTED Final   Candida tropicalis NOT DETECTED NOT DETECTED Final  Culture, blood (routine x 2)     Status: None (Preliminary result)   Collection Time: 06/06/17  1:45 PM  Result Value Ref Range Status   Specimen Description BLOOD RIGHT ANTECUBITAL  Final   Special Requests   Final    BOTTLES DRAWN AEROBIC AND ANAEROBIC Blood Culture adequate volume   Culture NO GROWTH 3 DAYS  Final   Report Status PENDING  Incomplete  Culture, blood (routine x 2)     Status: None (Preliminary result)   Collection Time: 06/06/17  1:54 PM  Result Value Ref Range Status   Specimen Description BLOOD LEFT ANTECUBITAL  Final   Special Requests   Final    BOTTLES DRAWN AEROBIC AND ANAEROBIC Blood Culture adequate volume   Culture NO GROWTH 3 DAYS  Final   Report Status PENDING  Incomplete  Aerobic/Anaerobic  Culture (surgical/deep wound)     Status: None (Preliminary result)   Collection Time: 06/06/17  4:09 PM  Result Value Ref Range Status   Specimen Description WOUND LEFT HIP  Final   Special Requests PT ON VANC ANCEF ROCEPHIN ZOSYN  Final   Gram Stain   Final    NO WBC SEEN NO SQUAMOUS EPITHELIAL CELLS SEEN NO ORGANISMS SEEN    Culture   Final    RARE STAPHYLOCOCCUS AUREUS NO ANAEROBES ISOLATED; CULTURE IN PROGRESS FOR 5 DAYS    Report Status PENDING  Incomplete   Organism ID, Bacteria STAPHYLOCOCCUS AUREUS  Final      Susceptibility   Staphylococcus aureus - MIC*    CIPROFLOXACIN <=0.5 SENSITIVE Sensitive     ERYTHROMYCIN <=0.25 SENSITIVE Sensitive     GENTAMICIN <=0.5 SENSITIVE Sensitive     OXACILLIN 0.5 SENSITIVE Sensitive     TETRACYCLINE <=1 SENSITIVE Sensitive     VANCOMYCIN <=0.5 SENSITIVE Sensitive     TRIMETH/SULFA <=10 SENSITIVE Sensitive     CLINDAMYCIN <=0.25 SENSITIVE Sensitive     RIFAMPIN <=0.5 SENSITIVE Sensitive     Inducible Clindamycin NEGATIVE Sensitive     * RARE STAPHYLOCOCCUS AUREUS  Surgical pcr screen     Status: Abnormal   Collection Time: 06/06/17  8:38 PM  Result Value Ref Range Status   MRSA, PCR NEGATIVE NEGATIVE Final   Staphylococcus aureus POSITIVE (A) NEGATIVE Final    Comment: (NOTE) The Xpert SA Assay (FDA approved for NASAL specimens in patients 64 years of age and older), is one component of a comprehensive surveillance program. It is not intended to diagnose infection nor to guide or monitor treatment.   Culture, sputum-assessment     Status: None   Collection Time: 06/07/17  9:14 AM  Result Value Ref Range Status   Specimen Description SPUTUM  Final   Special Requests NONE  Final   Sputum evaluation THIS SPECIMEN IS ACCEPTABLE FOR SPUTUM CULTURE  Final   Report Status 06/07/2017 FINAL  Final  Culture, respiratory (NON-Expectorated)     Status: None   Collection Time: 06/07/17  9:14 AM  Result Value Ref Range Status   Specimen  Description SPUTUM  Final   Special Requests NONE Reflexed from [id number]  Final   Gram Stain   Final    FEW WBC PRESENT, PREDOMINANTLY PMN NO ORGANISMS SEEN    Culture MODERATE CANDIDA TROPICALIS  Final   Report Status 06/09/2017 FINAL  Final  Aerobic/Anaerobic Culture (surgical/deep wound)     Status: None (Preliminary result)   Collection Time: 06/07/17  3:32 PM  Result Value Ref Range Status   Specimen Description WOUND  Final   Special Requests CHEST WALL ABSCESS  Final   Gram Stain   Final    ABUNDANT WBC PRESENT, PREDOMINANTLY PMN MODERATE GRAM POSITIVE COCCI    Culture   Final    MODERATE STAPHYLOCOCCUS AUREUS NO ANAEROBES ISOLATED; CULTURE IN PROGRESS FOR 5 DAYS    Report Status PENDING  Incomplete   Organism ID, Bacteria STAPHYLOCOCCUS AUREUS  Final      Susceptibility   Staphylococcus aureus - MIC*    CIPROFLOXACIN <=0.5 SENSITIVE Sensitive     ERYTHROMYCIN <=0.25 SENSITIVE Sensitive     GENTAMICIN <=0.5 SENSITIVE Sensitive     OXACILLIN 0.5 SENSITIVE Sensitive     TETRACYCLINE <=1 SENSITIVE Sensitive     VANCOMYCIN 1 SENSITIVE Sensitive     TRIMETH/SULFA <=10 SENSITIVE Sensitive     CLINDAMYCIN <=0.25 SENSITIVE Sensitive     RIFAMPIN <=0.5 SENSITIVE Sensitive     Inducible Clindamycin NEGATIVE Sensitive     * MODERATE STAPHYLOCOCCUS AUREUS    Radiology Reports Dg Chest 2 View  Result Date: 06/05/2017 CLINICAL DATA:  Shortness of Breath EXAM: CHEST  2 VIEW COMPARISON:  December 01, 2011 FINDINGS: There is patchy infiltrate in the lateral right base. Lungs elsewhere are clear. Heart size and pulmonary vascularity are normal. No adenopathy. No bone lesions. IMPRESSION: Infiltrate consistent with pneumonia lateral right base. Lungs elsewhere clear. Cardiac silhouette within normal limits. Electronically Signed   By: Bretta Bang III M.D.   On: 06/05/2017 14:47   Ct Soft Tissue Neck W Contrast  Result Date: 06/05/2017 CLINICAL DATA:  Neck mass.  Hard, red, raised  area in the neck. EXAM: CT NECK WITH CONTRAST TECHNIQUE: Multidetector CT imaging of the neck was performed using the standard protocol following the bolus administration of intravenous contrast. CONTRAST:  ISOVUE-300 IOPAMIDOL (ISOVUE-300) INJECTION 61% COMPARISON:  None. FINDINGS: Pharynx and larynx: No pharyngeal or laryngeal mass. Patent airway. No retropharyngeal fluid collection. Salivary glands: No inflammation, mass, or stone. Thyroid: Mass effect on the right thyroid lobe by the inflammatory process described below. No intrinsic thyroid abnormality identified. Lymph nodes: Subcentimeter but asymmetric anterior and posterior cervical lymph nodes throughout  the right neck, likely reactive. Vascular: Major vascular structures of the neck are patent. The below described inflammatory process extends into the right carotid space in the lower neck. Limited intracranial: Unremarkable. Visualized orbits: Not imaged. Mastoids and visualized paranasal sinuses: Partially visualized polypoid mucosal thickening or small mucous retention cyst in the left maxillary sinus. Visualized mastoid air cells are clear. Skeleton: There is a heterogeneous gas and fluid collection along the medial aspect of the right sternocleidomastoid muscle in the lower neck beginning near the clavicular head. This collection extends posteriorly and inferiorly into the anterior mediastinum, more fully evaluated on the separate chest CT. Phlegmon extends superiorly within/along the right sternocleidomastoid muscle in the lower and mid neck. Fluid extends superiorly in the right neck deep to the strap muscles and contains a few locules of gas, however this fluid does not appear as organized as the collections in the lower neck and upper chest. Inflammation in the right neck extends just superior to the level of the thyroid cartilage. No erosive changes are seen at the right sternoclavicular joint. Upper chest: Reported separately. Other: None.  IMPRESSION: Gas and fluid collections in the anterior upper chest as reported on separate chest CT, concerning for abscesses. Phlegmon and less organized gas and fluid extend superiorly in the anterior right neck with suspected myositis of the right sternocleidomastoid muscle. Electronically Signed   By: Sebastian Ache M.D.   On: 06/05/2017 16:31   Ct Chest W Contrast  Result Date: 06/05/2017 CLINICAL DATA:  Patient with shortness of breath. Red raised area anterior lower neck. EXAM: CT CHEST WITH CONTRAST TECHNIQUE: Multidetector CT imaging of the chest was performed during intravenous contrast administration. CONTRAST:  ISOVUE-300 IOPAMIDOL (ISOVUE-300) INJECTION 61% COMPARISON:  Chest CT 06/21/2005; neck CT same day. FINDINGS: Cardiovascular: Normal heart size. No pericardial effusion. Normal caliber thoracic aorta. Enlarged main pulmonary artery (3.3 cm) as can be seen with pulmonary arterial hypertension. Mediastinum/Nodes: 1.2 cm AP window lymph node (image 50; series 2). 7 mm right paratracheal lymph node (image 50; series 2). Normal appearance of the esophagus. No axillary adenopathy. Lungs/Pleura: Central airways are patent. Subpleural consolidation with adjacent tree-in-bud ground-glass nodularity within the right lower lobe (image 114; series 4). Within the right upper lobe there multiple areas of regional ground-glass attenuation. No pleural effusion or pneumothorax. Upper Abdomen: No acute process. Musculoskeletal: There is a large centrally necrotic peripherally enhancing mass within the lower neck which extends from the anterior aspect of the right sternal clavicular joint/distal right sternocleidomastoid superiorly along the right aspect of the thyroid and inferiorly within the retrosternal location. The largest portion of this mass measures 4.7 x 4.1 cm. There are multiple small foci of gas within the mass. No definite osseous destruction demonstrated within the right sternoclavicular joint.  IMPRESSION: 1. There is a large peripherally enhancing mass with central fluid and gas within the right lower neck extending from the anterior aspect of the right sternoclavicular location posteriorly along the retrosternal soft tissues. Findings are concerning for abscess. No definite osseous destruction at this time of the right sternoclavicular joint or adjacent costochondral joint however secondary infection of these joints is not excluded given the size of the suspected abscess. 2. Consolidation within the right lower lobe with right upper lobe ground-glass opacities favored to represent multifocal pneumonia. 3. Mediastinal and right hilar adenopathy likely reactive in etiology. Critical Value/emergent results were called by telephone at the time of interpretation on 06/05/2017 at 4:18 pm to Dr. Fayrene Fearing, who verbally acknowledged these  results. Electronically Signed   By: Annia Beltrew  Davis M.D.   On: 06/05/2017 16:29   Mr Hip Left W Wo Contrast  Result Date: 06/06/2017 CLINICAL DATA:  Severe left hip pain for the past few weeks. History of IV drug abuse and recurrent skin and soft tissue infections. EXAM: MRI OF THE LEFT HIP WITHOUT AND WITH CONTRAST TECHNIQUE: Multiplanar, multisequence MR imaging was performed both before and after administration of intravenous contrast. CONTRAST:  15mL MULTIHANCE GADOBENATE DIMEGLUMINE 529 MG/ML IV SOLN COMPARISON:  None. FINDINGS: Bones: There is no evidence of acute fracture, dislocation or avascular necrosis. The visualized bony pelvis appears normal. The visualized sacroiliac joints and symphysis pubis appear normal. Articular cartilage and labrum Articular cartilage: No focal chondral defect or subchondral signal abnormality identified. Labrum: There is a tear of the left anterior superior labrum. Joint or bursal effusion Joint effusion: Small left hip joint effusion with prominent synovial enhancement. No right hip joint effusion. Bursae: Prominent, rim enhancing fluid  within the left iliopsoas bursa, extending superiorly into the left iliacus muscle. The bursa measures up to 2.5 x 3.3 cm in maximal AP by TR dimension. No greater trochanteric bursal fluid. Muscles and tendons Muscles and tendons: Prominent muscle edema and enhancement of the left iliacus muscle. Mild myofascial edema along the left vastus and adductor compartments. The visualized gluteus, hamstring and iliopsoas tendons appear normal. The piriformis muscles appear symmetric. Other findings Miscellaneous: Trace presacral edema. The visualized internal pelvic contents otherwise appear unremarkable. IMPRESSION: 1. Findings consistent with left hip septic arthritis and left iliopsoas septic bursitis. The left iliopsoas bursa extends into the left iliacus muscle, which demonstrates prominent muscle edema and enhancement, consistent with infectious myositis. The preliminary findings were called by telephone at the time of interpretation on 06/06/2017 at 4:00 am to Dr. Marcial PacasIMOTHY OPYD, by Dr. Elgie CollardARASH RADPARVAR. Electronically Signed   By: Obie DredgeWilliam T Derry M.D.   On: 06/06/2017 08:15   Dg Chest Port 1 View  Result Date: 06/07/2017 CLINICAL DATA:  Shortness of breath, post debridement of RIGHT sternoclavicular abscess EXAM: PORTABLE CHEST 1 VIEW COMPARISON:  Portable exam 1639 hours compared to 06/05/2017 FINDINGS: Normal heart size, mediastinal contours, and pulmonary vascularity. Mild bronchitic changes. Question mild atelectasis versus infiltrate at medial RIGHT lung base. No additional infiltrate, pleural effusion or pneumothorax. No acute osseous findings. IMPRESSION: Mild chronic bronchitic changes with question minimal atelectasis versus infiltrate at medial RIGHT lung base. Electronically Signed   By: Ulyses SouthwardMark  Boles M.D.   On: 06/07/2017 17:47    Lab Data:  CBC: Recent Labs  Lab 06/05/17 1444 06/05/17 1457 06/06/17 0315 06/07/17 0210 06/08/17 0541 06/09/17 1237  WBC 23.0*  --  19.7* 18.0* 21.5* 19.4*    NEUTROABS 18.8*  --  15.1*  --   --   --   HGB 13.3 14.6 11.0* 11.2* 11.5* 12.2*  HCT 39.4 43.0 33.1* 33.1* 34.6* 36.4*  MCV 90.6  --  90.2 89.9 89.9 89.4  PLT 318  --  317 334 398 500*   Basic Metabolic Panel: Recent Labs  Lab 06/05/17 1444 06/05/17 1457 06/06/17 0315 06/07/17 0210 06/08/17 0541 06/09/17 1053  NA 137 135 134* 134* 136 135  K 3.7 3.6 4.0 3.6 4.1 3.8  CL 96* 95* 99* 97* 100* 102  CO2 27  --  24 27 24 22   GLUCOSE 109* 106* 82 123* 168* 159*  BUN 9 6 7 6 10 13   CREATININE 0.66 0.70 0.58* 0.64 0.62 0.67  CALCIUM 9.0  --  8.1* 8.2* 8.6* 8.8*   GFR: Estimated Creatinine Clearance: 128.2 mL/min (by C-G formula based on SCr of 0.67 mg/dL). Liver Function Tests: Recent Labs  Lab 06/05/17 1444 06/06/17 0315 06/07/17 0210 06/09/17 1053  AST 67* 43* 41 42*  ALT 63 47 39 36  ALKPHOS 201* 176* 185* 165*  BILITOT 0.5 0.3 0.3 0.6  PROT 7.5 6.4* 5.6* 6.6  ALBUMIN 2.9* 2.3* 2.1* 2.6*   No results for input(s): LIPASE, AMYLASE in the last 168 hours. No results for input(s): AMMONIA in the last 168 hours. Coagulation Profile: Recent Labs  Lab 06/07/17 0210  INR 1.11   Cardiac Enzymes: No results for input(s): CKTOTAL, CKMB, CKMBINDEX, TROPONINI in the last 168 hours. BNP (last 3 results) No results for input(s): PROBNP in the last 8760 hours. HbA1C: No results for input(s): HGBA1C in the last 72 hours. CBG: No results for input(s): GLUCAP in the last 168 hours. Lipid Profile: No results for input(s): CHOL, HDL, LDLCALC, TRIG, CHOLHDL, LDLDIRECT in the last 72 hours. Thyroid Function Tests: No results for input(s): TSH, T4TOTAL, FREET4, T3FREE, THYROIDAB in the last 72 hours. Anemia Panel: No results for input(s): VITAMINB12, FOLATE, FERRITIN, TIBC, IRON, RETICCTPCT in the last 72 hours. Urine analysis:    Component Value Date/Time   COLORURINE YELLOW 06/06/2017 1333   APPEARANCEUR CLEAR 06/06/2017 1333   LABSPEC 1.010 06/06/2017 1333   PHURINE 6.0  06/06/2017 1333   GLUCOSEU NEGATIVE 06/06/2017 1333   HGBUR NEGATIVE 06/06/2017 1333   BILIRUBINUR NEGATIVE 06/06/2017 1333   KETONESUR NEGATIVE 06/06/2017 1333   PROTEINUR NEGATIVE 06/06/2017 1333   UROBILINOGEN 0.2 10/03/2012 0856   NITRITE NEGATIVE 06/06/2017 1333   LEUKOCYTESUR NEGATIVE 06/06/2017 1333     Marygrace Sandoval M.D. [facility] 06/09/2017, 2:15 PM  Pager: [phone] Between 7am to 7pm - call Pager - [phone]  After 7pm go to [email or web address] - password [email or web address]  Call night coverage person covering after 7pm

## 2017-06-09 NOTE — Progress Notes (Signed)
PT Cancellation Note  Patient Details Name: Clifford BoschChristopher Tucker MRN: [id number] DOB: 1981-03-31   Cancelled Treatment:      Chart reviewed, RN consulted. Stopped by room and spoke to patient regarding his current mobility needs. Patient reports he has been AMB independently with IV pole down to coffee shop on 2nd floor 4x since yesterday and feels confident in his AMB over limited community distances. Pt reports high confidence with SPC use in AMB. Patient is at modified independent with all activity at this time, all education completed, and time is given to address all questions/concerns. No additional skilled PT services needed at this time, PT signing off. PT recommends daily ambulation ad lib or with nursing staff as needed to prevent deconditioning.   3:49 PM, 06/09/17 Rosamaria LintsAllan C Yaviel Kloster, PT, DPT [facility] [place] [phone] (Pager)  [phone] [facility](Mobile)  [phone] (Office)      [name] 06/09/2017, 3:46 PM

## 2017-06-09 NOTE — Progress Notes (Addendum)
[address]       [place] [address]             [phone]      2 Days Post-Op Procedure(s) (LRB): DEBRIDEMENT RIGHT STERNOCLAVICULAR ABSCESS (Right) APPLICATION OF WOUND VAC (Right) Subjective: C/o mult areas of pain, reasonable overall control  Objective: Vital signs in last 24 hours: Temp:  [98.6 F (37 C)-98.9 F (37.2 C)] 98.6 F (37 C) (01/18 0507) Pulse Rate:  [60-72] 60 (01/18 0507) Resp:  [18] 18 (01/18 0507) BP: (113-151)/(42-89) 146/89 (01/18 0507) SpO2:  [99 %-100 %] 100 % (01/18 0507)  Hemodynamic parameters for last 24 hours:    Intake/Output from previous day: 01/17 0701 - 01/18 0700 In: 1255.8 [P.O.:600; I.V.:555.8; IV Piggyback:100] Out: 1 [Urine:1] Intake/Output this shift: No intake/output data recorded.  General appearance: alert, cooperative and no distress Heart: regular rate and rhythm Lungs: clear to auscultation bilaterally Extremities: some edema LLE with knee swelling Wound: vac in place  Lab Results: Recent Labs    06/07/17 0210 06/08/17 0541  WBC 18.0* 21.5*  HGB 11.2* 11.5*  HCT 33.1* 34.6*  PLT 334 398   BMET:  Recent Labs    06/07/17 0210 06/08/17 0541  NA 134* 136  K 3.6 4.1  CL 97* 100*  CO2 27 24  GLUCOSE 123* 168*  BUN 6 10  CREATININE 0.64 0.62  CALCIUM 8.2* 8.6*    PT/INR:  Recent Labs    06/07/17 0210  LABPROT 14.2  INR 1.11   ABG    Component Value Date/Time   PHART 7.430 06/07/2017 0940   HCO3 27.5 06/07/2017 0940   TCO2 29 06/05/2017 1457   ACIDBASEDEF 4.0 (H) 03/13/2007 0120   O2SAT 90.2 06/07/2017 0940    Results for orders placed or performed during the hospital encounter of 06/05/17  Blood culture (routine x 2)     Status: Abnormal   Collection Time: 06/05/17  2:44 PM  Result Value Ref Range Status   Specimen Description RIGHT ANTECUBITAL  Final   Special Requests   Final    BOTTLES DRAWN AEROBIC ONLY Blood Culture adequate volume   Culture  Setup Time    Final    GRAM POSITIVE COCCI Gram Stain Report Called to,Read Back By and Verified With: ROWE,C AT 0730 BY HUFFINES,S ON 06/06/17. AEROBIC BOTTLE ONLY    Culture (A)  Final    STAPHYLOCOCCUS AUREUS SUSCEPTIBILITIES PERFORMED ON PREVIOUS CULTURE WITHIN THE LAST 5 DAYS. Performed at [facility], [address] [place], [place] [address]    Report Status 06/08/2017 FINAL  Final  Blood culture (routine x 2)     Status: Abnormal   Collection Time: 06/05/17  2:47 PM  Result Value Ref Range Status   Specimen Description LEFT ANTECUBITAL  Final   Special Requests   Final    BOTTLES DRAWN AEROBIC AND ANAEROBIC Blood Culture adequate volume   Culture  Setup Time   Final    GRAM POSITIVE COCCI BOTH AEB AND ANA Gram Stain Report Called to,Read Back By and Verified With: J.CRUISE, RN @ [facility] ED @ 0545 ON 1.15.19 BY BOWMAN,L CRITICAL RESULT CALLED TO, READ BACK BY AND VERIFIED WITH: N. Batchelder Pharm.D. 8:55 06/06/17 ([email or web address]) Performed at [facility], [address] [place], [place] [address]    Culture STAPHYLOCOCCUS AUREUS (A)  Final   Report Status 06/08/2017 FINAL  Final   Organism ID, Bacteria STAPHYLOCOCCUS AUREUS  Final  Susceptibility   Staphylococcus aureus - MIC*    CIPROFLOXACIN <=0.5 SENSITIVE Sensitive     ERYTHROMYCIN <=0.25 SENSITIVE Sensitive     GENTAMICIN <=0.5 SENSITIVE Sensitive     OXACILLIN <=0.25 SENSITIVE Sensitive     TETRACYCLINE <=1 SENSITIVE Sensitive     VANCOMYCIN <=0.5 SENSITIVE Sensitive     TRIMETH/SULFA <=10 SENSITIVE Sensitive     CLINDAMYCIN <=0.25 SENSITIVE Sensitive     RIFAMPIN <=0.5 SENSITIVE Sensitive     Inducible Clindamycin NEGATIVE Sensitive     * STAPHYLOCOCCUS AUREUS  Blood Culture ID Panel (Reflexed)     Status: Abnormal   Collection Time: 06/05/17  2:47 PM  Result Value Ref Range Status   Enterococcus species NOT DETECTED NOT DETECTED Final   Listeria monocytogenes NOT DETECTED NOT DETECTED Final   Staphylococcus  species DETECTED (A) NOT DETECTED Final    Comment: CRITICAL RESULT CALLED TO, READ BACK BY AND VERIFIED WITH: N. Batchelder Pharm.D. 8:55 06/06/17 ([email or web address])    Staphylococcus aureus DETECTED (A) NOT DETECTED Final    Comment: Methicillin (oxacillin) susceptible Staphylococcus aureus (MSSA). Preferred therapy is anti staphylococcal beta lactam antibiotic (Cefazolin or Nafcillin), unless clinically contraindicated. CRITICAL RESULT CALLED TO, READ BACK BY AND VERIFIED WITH: N. Batchelder Pharm.D. 8:55 06/06/17 (wilsonm)    Methicillin resistance NOT DETECTED NOT DETECTED Final   Streptococcus species NOT DETECTED NOT DETECTED Final   Streptococcus agalactiae NOT DETECTED NOT DETECTED Final   Streptococcus pneumoniae NOT DETECTED NOT DETECTED Final   Streptococcus pyogenes NOT DETECTED NOT DETECTED Final   Acinetobacter baumannii NOT DETECTED NOT DETECTED Final   Enterobacteriaceae species NOT DETECTED NOT DETECTED Final   Enterobacter cloacae complex NOT DETECTED NOT DETECTED Final   Escherichia coli NOT DETECTED NOT DETECTED Final   Klebsiella oxytoca NOT DETECTED NOT DETECTED Final   Klebsiella pneumoniae NOT DETECTED NOT DETECTED Final   Proteus species NOT DETECTED NOT DETECTED Final   Serratia marcescens NOT DETECTED NOT DETECTED Final   Haemophilus influenzae NOT DETECTED NOT DETECTED Final   Neisseria meningitidis NOT DETECTED NOT DETECTED Final   Pseudomonas aeruginosa NOT DETECTED NOT DETECTED Final   Candida albicans NOT DETECTED NOT DETECTED Final   Candida glabrata NOT DETECTED NOT DETECTED Final   Candida krusei NOT DETECTED NOT DETECTED Final   Candida parapsilosis NOT DETECTED NOT DETECTED Final   Candida tropicalis NOT DETECTED NOT DETECTED Final  Culture, blood (routine x 2)     Status: None (Preliminary result)   Collection Time: 06/06/17  1:45 PM  Result Value Ref Range Status   Specimen Description BLOOD RIGHT ANTECUBITAL  Final   Special Requests   Final    BOTTLES  DRAWN AEROBIC AND ANAEROBIC Blood Culture adequate volume   Culture NO GROWTH 2 DAYS  Final   Report Status PENDING  Incomplete  Culture, blood (routine x 2)     Status: None (Preliminary result)   Collection Time: 06/06/17  1:54 PM  Result Value Ref Range Status   Specimen Description BLOOD LEFT ANTECUBITAL  Final   Special Requests   Final    BOTTLES DRAWN AEROBIC AND ANAEROBIC Blood Culture adequate volume   Culture NO GROWTH 2 DAYS  Final   Report Status PENDING  Incomplete  Aerobic/Anaerobic Culture (surgical/deep wound)     Status: None (Preliminary result)   Collection Time: 06/06/17  4:09 PM  Result Value Ref Range Status   Specimen Description WOUND LEFT HIP  Final   Special Requests PT ON VANC ANCEF ROCEPHIN ZOSYN  Final   Gram Stain   Final    NO WBC SEEN NO SQUAMOUS EPITHELIAL CELLS SEEN NO ORGANISMS SEEN    Culture   Final    RARE STAPHYLOCOCCUS AUREUS SUSCEPTIBILITIES TO FOLLOW NO ANAEROBES ISOLATED; CULTURE IN PROGRESS FOR 5 DAYS    Report Status PENDING  Incomplete  Surgical pcr screen     Status: Abnormal   Collection Time: 06/06/17  8:38 PM  Result Value Ref Range Status   MRSA, PCR NEGATIVE NEGATIVE Final   Staphylococcus aureus POSITIVE (A) NEGATIVE Final    Comment: (NOTE) The Xpert SA Assay (FDA approved for NASAL specimens in patients 77 years of age and older), is one component of a comprehensive surveillance program. It is not intended to diagnose infection nor to guide or monitor treatment.   Culture, sputum-assessment     Status: None   Collection Time: 06/07/17  9:14 AM  Result Value Ref Range Status   Specimen Description SPUTUM  Final   Special Requests NONE  Final   Sputum evaluation THIS SPECIMEN IS ACCEPTABLE FOR SPUTUM CULTURE  Final   Report Status 06/07/2017 FINAL  Final  Culture, respiratory (NON-Expectorated)     Status: None (Preliminary result)   Collection Time: 06/07/17  9:14 AM  Result Value Ref Range Status   Specimen  Description SPUTUM  Final   Special Requests NONE Reflexed from [id number]  Final   Gram Stain   Final    FEW WBC PRESENT, PREDOMINANTLY PMN NO ORGANISMS SEEN    Culture CULTURE REINCUBATED FOR BETTER GROWTH  Final   Report Status PENDING  Incomplete  Aerobic/Anaerobic Culture (surgical/deep wound)     Status: None (Preliminary result)   Collection Time: 06/07/17  3:32 PM  Result Value Ref Range Status   Specimen Description WOUND  Final   Special Requests CHEST WALL ABSCESS  Final   Gram Stain   Final    ABUNDANT WBC PRESENT, PREDOMINANTLY PMN MODERATE GRAM POSITIVE COCCI    Culture   Final    MODERATE STAPHYLOCOCCUS AUREUS SUSCEPTIBILITIES TO FOLLOW    Report Status PENDING  Incomplete     CBG (last 3)  No results for input(s): GLUCAP in the last 72 hours.  Meds Scheduled Meds: . acetaminophen  1,000 mg Oral Q6H   Or  . acetaminophen (TYLENOL) oral liquid 160 mg/5 mL  1,000 mg Oral Q6H  . bisacodyl  10 mg Oral Daily  . Chlorhexidine Gluconate Cloth  6 each Topical Daily  . enoxaparin (LOVENOX) injection  40 mg Subcutaneous Q24H  . iopamidol  20 mL Intra-articular Once  . lidocaine (PF)  5 mL Intradermal Once  . mupirocin ointment  1 application Nasal BID  . senna-docusate  1 tablet Oral QHS  . sodium chloride  10 mL Intravenous Once   Continuous Infusions: . sodium chloride    .  ceFAZolin (ANCEF) IV Stopped (06/09/17 0233)  . dextrose 5 % and 0.45% NaCl 30 mL/hr at 06/07/17 2000  . lactated ringers 10 mL/hr at 06/06/17 1406  . lactated ringers 10 mL/hr at 06/07/17 1354  . potassium chloride     PRN Meds:.acetaminophen **OR** acetaminophen, diphenhydrAMINE, fentaNYL (SUBLIMAZE) injection, HYDROmorphone (DILAUDID) injection, ketorolac, metoCLOPramide **OR** metoCLOPramide (REGLAN) injection, ondansetron **OR** ondansetron (ZOFRAN) IV, oxyCODONE, potassium chloride, senna-docusate, zolpidem  Xrays Dg Chest Port 1 View  Result Date: 06/07/2017 CLINICAL DATA:   Shortness of breath, post debridement of RIGHT sternoclavicular abscess EXAM: PORTABLE CHEST 1 VIEW COMPARISON:  Portable exam 1639 hours compared to  06/05/2017 FINDINGS: Normal heart size, mediastinal contours, and pulmonary vascularity. Mild bronchitic changes. Question mild atelectasis versus infiltrate at medial RIGHT lung base. No additional infiltrate, pleural effusion or pneumothorax. No acute osseous findings. IMPRESSION: Mild chronic bronchitic changes with question minimal atelectasis versus infiltrate at medial RIGHT lung base. Electronically Signed   By: Ulyses SouthwardMark  Boles M.D.   On: 06/07/2017 17:47    Assessment/Plan: S/P Procedure(s) (LRB): DEBRIDEMENT RIGHT STERNOCLAVICULAR ABSCESS (Right) APPLICATION OF WOUND VAC (Right)  1 stable, improved surrounding erethema at vac site. For vac change today by wound care nurse 2 no fevers, no CBC done today- ABX as per ID 3 ortho management per Dr August Saucerean 4 med management as per hospitalist  LOS: 4 days    [name] 06/09/2017 Continue IV antibiotics and wound VAC therapy Encourage patient for improve nutrition patient examined and medical record reviewed,agree with above note. [name] 06/09/2017

## 2017-06-09 NOTE — Progress Notes (Addendum)
    [facility]   Reason for visit: Follow up on MSSA bacteremia  Interval History: for IR drainage today, TTE without vegetation.   Physical Exam: Constitutional:  Vitals:   06/08/17 2135 06/09/17 0507  BP: (!) 151/83 (!) 146/89  Pulse: 66 60  Resp: 18 18  Temp: 98.9 F (37.2 C) 98.6 F (37 C)  SpO2: 100% 100%   patient appears in NAD  Impression: bacteremia, follow up cultures ngtd.   Plan: 1. Continue cefazolin

## 2017-06-10 ENCOUNTER — Inpatient Hospital Stay (HOSPITAL_COMMUNITY): Payer: Self-pay

## 2017-06-10 DIAGNOSIS — M79609 Pain in unspecified limb: Secondary | ICD-10-CM

## 2017-06-10 LAB — BASIC METABOLIC PANEL
Anion gap: 11 (ref 5–15)
BUN: 10 mg/dL (ref 6–20)
CO2: 22 mmol/L (ref 22–32)
Calcium: 8.5 mg/dL — ABNORMAL LOW (ref 8.9–10.3)
Chloride: 102 mmol/L (ref 101–111)
Creatinine, Ser: 0.62 mg/dL (ref 0.61–1.24)
GFR calc Af Amer: >60 mL/min (ref >60)
GFR calc non Af Amer: >60 mL/min (ref >60)
Glucose, Bld: 121 mg/dL — ABNORMAL HIGH (ref 65–99)
Potassium: 3.4 mmol/L — ABNORMAL LOW (ref 3.5–5.1)
Sodium: 135 mmol/L (ref 135–145)

## 2017-06-10 MED ORDER — ALUM & MAG HYDROXIDE-SIMETH 200-200-20 MG/5ML PO SUSP
15.0000 mL | Freq: Four times a day (QID) | ORAL | Status: DC | PRN
  Administered 2017-06-10: 15 mL via ORAL
  Filled 2017-06-10: qty 30

## 2017-06-10 MED ORDER — NICOTINE 21 MG/24HR TD PT24
21.0000 mg | MEDICATED_PATCH | Freq: Every day | TRANSDERMAL | Status: DC
  Administered 2017-06-10 – 2017-06-14 (×4): 21 mg via TRANSDERMAL
  Filled 2017-06-10 (×9): qty 1

## 2017-06-10 MED ORDER — DIPHENHYDRAMINE HCL 25 MG PO CAPS
50.0000 mg | ORAL_CAPSULE | Freq: Once | ORAL | Status: DC
  Filled 2017-06-10: qty 2

## 2017-06-10 MED ORDER — PANTOPRAZOLE SODIUM 40 MG PO TBEC
40.0000 mg | DELAYED_RELEASE_TABLET | Freq: Every day | ORAL | Status: DC
  Administered 2017-06-10 – 2017-06-16 (×6): 40 mg via ORAL
  Filled 2017-06-10 (×7): qty 1

## 2017-06-10 NOTE — Progress Notes (Signed)
3 Days Post-Op Procedure(s) (LRB): DEBRIDEMENT RIGHT STERNOCLAVICULAR ABSCESS (Right) APPLICATION OF WOUND VAC (Right) Subjective: Multiple complaints Says VAC change "wasn't too bad"  Objective: Vital signs in last 24 hours: Temp:  [98 F (36.7 C)-98.3 F (36.8 C)] 98.2 F (36.8 C) (01/19 0521) Pulse Rate:  [51-71] 54 (01/19 0521) Cardiac Rhythm: Normal sinus rhythm (01/18 1830) Resp:  [10-18] 14 (01/19 0521) BP: (114-130)/(68-81) 129/70 (01/19 0521) SpO2:  [96 %-100 %] 97 % (01/19 0521)  Hemodynamic parameters for last 24 hours:    Intake/Output from previous day: 01/18 0701 - 01/19 0700 In: 915.5 [P.O.:222; I.V.:288.5; IV Piggyback:400] Out: 850 [Urine:800; Drains:50] Intake/Output this shift: No intake/output data recorded.  General appearance: alert and no distress Wound: VAC in place, minimal surrounding erythema  Lab Results: Recent Labs    06/08/17 0541 06/09/17 1237  WBC 21.5* 19.4*  HGB 11.5* 12.2*  HCT 34.6* 36.4*  PLT 398 500*   BMET:  Recent Labs    06/09/17 1053 06/10/17 0635  NA 135 135  K 3.8 3.4*  CL 102 102  CO2 22 22  GLUCOSE 159* 121*  BUN 13 10  CREATININE 0.67 0.62  CALCIUM 8.8* 8.5*    PT/INR: No results for input(s): LABPROT, INR in the last 72 hours. ABG    Component Value Date/Time   PHART 7.430 06/07/2017 0940   HCO3 27.5 06/07/2017 0940   TCO2 29 06/05/2017 1457   ACIDBASEDEF 4.0 (H) 03/13/2007 0120   O2SAT 90.2 06/07/2017 0940   CBG (last 3)  No results for input(s): GLUCAP in the last 72 hours.  Assessment/Plan: S/P Procedure(s) (LRB): DEBRIDEMENT RIGHT STERNOCLAVICULAR ABSCESS (Right) APPLICATION OF WOUND VAC (Right) -Continue VAC for local wound care Continue antibiotics   LOS: 5 days    Loreli SlotSteven C Hendrickson 06/10/2017

## 2017-06-10 NOTE — Progress Notes (Signed)
[facility]                                                                              Patient Demographics  Clifford Tucker, is a 37 y.o. male, DOB - 1981-02-23, NWG:[id number]  Admit date - 06/05/2017   Admitting Physician Briscoe Deutscher, MD  Outpatient Primary MD for the patient is Patient, No Pcp Per  Outpatient specialists:   LOS - 5  days   Medical records reviewed and are as summarized below:    Chief Complaint  Patient presents with  . Oral Swelling       Brief summary   Clifford Tucker is a 37 y.o. malewith medical history significant forIV drug abuse and recurrent skin and soft tissue infections. He presented with neck/hip pain found to have a neck abscess, septic arthritis of the hip and now MSSA bacteremia. CT surgery, orthopedic surgery and infectious disease consulted.   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  Reported having history of multiple abscesses over the last few weeks -Patient was originally placed on vancomycin and Zosyn, transition to IV Ancef -Sternal aspirate showing moderate staph aureus, left hip wound cultures showing rare staph aureus, sensitivities pending -2D echo with no vegetation.  ID following, recommended continue cefazolin  Active Problems:   Multifocal pneumonia -ID following, continue cefazolin  Left hip abscess/iliopsoas septic arthritis, infectious myositis -Status post left hip I&D with cultures done on 1/15, showing staph aureus -Continue Ancef -Status post drain placement 1/18 for iliacus abscess -Venous Doppler of the lower extremity to rule out DVT, states leg is more painful and swollen  Septic arthritis of the sternoclavicular joint, abscess -CTVS following, underwent debridement with  wound VAC of the neck abscess    IV drug user -Per patient, has been using heroin, [facility] but relapsed a month ago  -Last heroin use a week ago, so far not in any withdrawals, monitor closely -Social work consulted for rehab  Nicotine abuse -Nicotine patch placed   Code Status: Full code DVT Prophylaxis: Lovenox Family Communication: Discussed in detail with the patient, all imaging results, lab results explained to the patient and girlfriend in the room   Disposition Plan:   Time Spent in minutes   15 minutes  Procedures:  I&D left hip 1/15  Consultants:   Orthopedics CT VS ID  Antimicrobials:      Medications  Scheduled Meds: . acetaminophen  1,000 mg Oral Q6H   Or  . acetaminophen (TYLENOL) oral liquid 160 mg/5 mL  1,000 mg Oral Q6H  . Chlorhexidine Gluconate Cloth  6 each Topical Daily  . enoxaparin (LOVENOX) injection  40 mg Subcutaneous Q24H  . iopamidol  20 mL Intra-articular Once  . lidocaine (PF)  5 mL Intradermal Once  . mupirocin ointment  1 application Nasal BID  . nicotine  21 mg Transdermal Daily  . pantoprazole  40 mg Oral  [id number]  . sodium chloride  10 mL Intravenous Once  . sodium chloride flush  5 mL Intravenous Q8H   Continuous Infusions: . sodium chloride    .  ceFAZolin (ANCEF) IV Stopped (06/10/17 1031)  . dextrose 5 % and 0.45% NaCl 30 mL/hr at 06/07/17 2000  . lactated ringers 10 mL/hr at 06/06/17 1406  . lactated ringers 10 mL/hr at 06/07/17 1354  . potassium chloride     PRN Meds:.acetaminophen **OR** acetaminophen, alum & mag hydroxide-simeth, diphenhydrAMINE, fentaNYL (SUBLIMAZE) injection, HYDROmorphone (DILAUDID) injection, ketorolac, metoCLOPramide **OR** metoCLOPramide (REGLAN) injection, ondansetron **OR** ondansetron (ZOFRAN) IV, oxyCODONE, potassium chloride, senna-docusate, zolpidem   Antibiotics   Anti-infectives (From admission, onward)   Start     Dose/Rate Route Frequency Ordered Stop    06/07/17 1400  cefUROXime (ZINACEF) 1.5 g in dextrose 5 % 50 mL IVPB     1.5 g 100 mL/hr over 30 Minutes Intravenous To [facility] 06/07/17 0125 06/07/17 1527   06/07/17 0745  vancomycin (VANCOCIN) 1,000 mg in sodium chloride 0.9 % 1,000 mL irrigation      Irrigation To Surgery 06/07/17 0744 06/07/17 1513   06/06/17 1639  gentamicin (GARAMYCIN) injection  Status:  Discontinued       As needed 06/06/17 1640 06/06/17 1715   06/06/17 1638  vancomycin (VANCOCIN) powder  Status:  Discontinued       As needed 06/06/17 1639 06/06/17 1715   06/06/17 1000  ceFAZolin (ANCEF) IVPB 2g/100 mL premix     2 g 200 mL/hr over 30 Minutes Intravenous Every 8 hours 06/06/17 0911     06/06/17 0000  vancomycin (VANCOCIN) IVPB 1000 mg/200 mL premix  Status:  Discontinued     1,000 mg 200 mL/hr over 60 Minutes Intravenous Every 8 hours 06/05/17 2211 06/06/17 0911   06/05/17 2330  piperacillin-tazobactam (ZOSYN) IVPB 3.375 g  Status:  Discontinued     3.375 g 12.5 mL/hr over 240 Minutes Intravenous Every 8 hours 06/05/17 2211 06/06/17 0911   06/05/17 2200  cefTRIAXone (ROCEPHIN) 1 g in dextrose 5 % 50 mL IVPB  Status:  Discontinued     1 g 100 mL/hr over 30 Minutes Intravenous Every 24 hours 06/05/17 2140 06/05/17 2202   06/05/17 2200  azithromycin (ZITHROMAX) 500 mg in dextrose 5 % 250 mL IVPB  Status:  Discontinued     500 mg 250 mL/hr over 60 Minutes Intravenous Every 24 hours 06/05/17 2140 06/06/17 0911   06/05/17 1600  vancomycin (VANCOCIN) 1,500 mg in sodium chloride 0.9 % 500 mL IVPB     1,500 mg 250 mL/hr over 120 Minutes Intravenous  Once 06/05/17 1542 06/05/17 1855   06/05/17 1515  piperacillin-tazobactam (ZOSYN) IVPB 3.375 g     3.375 g 100 mL/hr over 30 Minutes Intravenous  Once 06/05/17 1512 06/05/17 1557        Subjective:   Clifford Tucker was seen and examined today.  did not sleep well last night, feels exhausted, pain not well controlled. Patient denies dizziness, chest pain,  shortness of breath, abdominal pain, N/V/D/C, new weakness, numbess, tingling.  States left leg is hurting more and swollen  Objective:   Vitals:   06/09/17 1830 06/09/17 2150 06/10/17 0521 06/10/17 1337  BP: 118/68 123/78 129/70 133/80  Pulse: 63 70 (!) 54 69  Resp: 11 12 14 15   Temp:  98.3 F (36.8 C) 98.2 F (36.8 C) 98.9 F (37.2 C)  TempSrc:  Oral Oral Oral  SpO2: 96% 100% 97% 98%  Weight:  Height:        Intake/Output Summary (Last 24 hours) at 06/10/2017 1419 Last data filed at 06/10/2017 1345 Gross per 24 hour  Intake 1137.5 ml  Output 850 ml  Net 287.5 ml     Wt Readings from Last 3 Encounters:  06/06/17 70.3 kg (154 lb 15.7 oz)  10/03/12 72.6 kg (160 lb)     Exam    General: Alert and oriented x 3, NAD  Eyes:   HEENT:    Cardiovascular: S1 S2 auscultated, Regular rate and rhythm. No pedal edema b/l  Respiratory: Clear to auscultation bilaterally, no wheezing, rales or rhonchi + wound VAC  Gastrointestinal: Soft, nontender, nondistended, + bowel sounds  Ext: no pedal edema bilaterally.  Left thigh dressing intact  Neuro: no new deficits  Musculoskeletal: No digital cyanosis, clubbing  Skin: Dressing intact left thigh  Psych: Normal affect and demeanor, alert and oriented x3    Data Reviewed:  I have personally reviewed following labs and imaging studies  Micro Results Recent Results (from the past 240 hour(s))  Blood culture (routine x 2)     Status: Abnormal   Collection Time: 06/05/17  2:44 PM  Result Value Ref Range Status   Specimen Description RIGHT ANTECUBITAL  Final   Special Requests   Final    BOTTLES DRAWN AEROBIC ONLY Blood Culture adequate volume   Culture  Setup Time   Final    GRAM POSITIVE COCCI Gram Stain Report Called to,Read Back By and Verified With: ROWE,C AT 0730 BY HUFFINES,S ON 06/06/17. AEROBIC BOTTLE ONLY    Culture (A)  Final    STAPHYLOCOCCUS AUREUS SUSCEPTIBILITIES PERFORMED ON PREVIOUS CULTURE WITHIN  THE LAST 5 DAYS. Performed at [facility], [address] [place], [place] [address]    Report Status 06/08/2017 FINAL  Final  Blood culture (routine x 2)     Status: Abnormal   Collection Time: 06/05/17  2:47 PM  Result Value Ref Range Status   Specimen Description LEFT ANTECUBITAL  Final   Special Requests   Final    BOTTLES DRAWN AEROBIC AND ANAEROBIC Blood Culture adequate volume   Culture  Setup Time   Final    GRAM POSITIVE COCCI BOTH AEB AND ANA Gram Stain Report Called to,Read Back By and Verified With: J.CRUISE, RN @ [facility] @ 0545 ON 1.15.19 BY BOWMAN,L CRITICAL RESULT CALLED TO, READ BACK BY AND VERIFIED WITH: N. Batchelder Pharm.D. 8:55 06/06/17 ([email or web address]) Performed at [facility], [address] [place], [place] [address]    Culture STAPHYLOCOCCUS AUREUS (A)  Final   Report Status 06/08/2017 FINAL  Final   Organism ID, Bacteria STAPHYLOCOCCUS AUREUS  Final      Susceptibility   Staphylococcus aureus - MIC*    CIPROFLOXACIN <=0.5 SENSITIVE Sensitive     ERYTHROMYCIN <=0.25 SENSITIVE Sensitive     GENTAMICIN <=0.5 SENSITIVE Sensitive     OXACILLIN <=0.25 SENSITIVE Sensitive     TETRACYCLINE <=1 SENSITIVE Sensitive     VANCOMYCIN <=0.5 SENSITIVE Sensitive     TRIMETH/SULFA <=10 SENSITIVE Sensitive     CLINDAMYCIN <=0.25 SENSITIVE Sensitive     RIFAMPIN <=0.5 SENSITIVE Sensitive     Inducible Clindamycin NEGATIVE Sensitive     * STAPHYLOCOCCUS AUREUS  Blood Culture ID Panel (Reflexed)     Status: Abnormal   Collection Time: 06/05/17  2:47 PM  Result Value Ref Range Status   Enterococcus species NOT DETECTED NOT DETECTED Final   Listeria monocytogenes NOT  DETECTED NOT DETECTED Final   Staphylococcus species DETECTED (A) NOT DETECTED Final    Comment: CRITICAL RESULT CALLED TO, READ BACK BY AND VERIFIED WITH: N. Batchelder Pharm.D. 8:55 06/06/17 ([email or web address])    Staphylococcus aureus DETECTED (A) NOT DETECTED Final    Comment: Methicillin (oxacillin)  susceptible Staphylococcus aureus (MSSA). Preferred therapy is anti staphylococcal beta lactam antibiotic (Cefazolin or Nafcillin), unless clinically contraindicated. CRITICAL RESULT CALLED TO, READ BACK BY AND VERIFIED WITH: N. Batchelder Pharm.D. 8:55 06/06/17 ([email or web address])    Methicillin resistance NOT DETECTED NOT DETECTED Final   Streptococcus species NOT DETECTED NOT DETECTED Final   Streptococcus agalactiae NOT DETECTED NOT DETECTED Final   Streptococcus pneumoniae NOT DETECTED NOT DETECTED Final   Streptococcus pyogenes NOT DETECTED NOT DETECTED Final   Acinetobacter baumannii NOT DETECTED NOT DETECTED Final   Enterobacteriaceae species NOT DETECTED NOT DETECTED Final   Enterobacter cloacae complex NOT DETECTED NOT DETECTED Final   Escherichia coli NOT DETECTED NOT DETECTED Final   Klebsiella oxytoca NOT DETECTED NOT DETECTED Final   Klebsiella pneumoniae NOT DETECTED NOT DETECTED Final   Proteus species NOT DETECTED NOT DETECTED Final   Serratia marcescens NOT DETECTED NOT DETECTED Final   Haemophilus influenzae NOT DETECTED NOT DETECTED Final   Neisseria meningitidis NOT DETECTED NOT DETECTED Final   Pseudomonas aeruginosa NOT DETECTED NOT DETECTED Final   Candida albicans NOT DETECTED NOT DETECTED Final   Candida glabrata NOT DETECTED NOT DETECTED Final   Candida krusei NOT DETECTED NOT DETECTED Final   Candida parapsilosis NOT DETECTED NOT DETECTED Final   Candida tropicalis NOT DETECTED NOT DETECTED Final  Culture, blood (routine x 2)     Status: None (Preliminary result)   Collection Time: 06/06/17  1:45 PM  Result Value Ref Range Status   Specimen Description BLOOD RIGHT ANTECUBITAL  Final   Special Requests   Final    BOTTLES DRAWN AEROBIC AND ANAEROBIC Blood Culture adequate volume   Culture NO GROWTH 3 DAYS  Final   Report Status PENDING  Incomplete  Culture, blood (routine x 2)     Status: None (Preliminary result)   Collection Time: 06/06/17  1:54 PM  Result Value  Ref Range Status   Specimen Description BLOOD LEFT ANTECUBITAL  Final   Special Requests   Final    BOTTLES DRAWN AEROBIC AND ANAEROBIC Blood Culture adequate volume   Culture NO GROWTH 3 DAYS  Final   Report Status PENDING  Incomplete  Aerobic/Anaerobic Culture (surgical/deep wound)     Status: None (Preliminary result)   Collection Time: 06/06/17  4:09 PM  Result Value Ref Range Status   Specimen Description WOUND LEFT HIP  Final   Special Requests PT ON VANC ANCEF ROCEPHIN ZOSYN  Final   Gram Stain   Final    NO WBC SEEN NO SQUAMOUS EPITHELIAL CELLS SEEN NO ORGANISMS SEEN    Culture   Final    RARE STAPHYLOCOCCUS AUREUS NO ANAEROBES ISOLATED; CULTURE IN PROGRESS FOR 5 DAYS    Report Status PENDING  Incomplete   Organism ID, Bacteria STAPHYLOCOCCUS AUREUS  Final      Susceptibility   Staphylococcus aureus - MIC*    CIPROFLOXACIN <=0.5 SENSITIVE Sensitive     ERYTHROMYCIN <=0.25 SENSITIVE Sensitive     GENTAMICIN <=0.5 SENSITIVE Sensitive     OXACILLIN 0.5 SENSITIVE Sensitive     TETRACYCLINE <=1 SENSITIVE Sensitive     VANCOMYCIN <=0.5 SENSITIVE Sensitive     TRIMETH/SULFA <=10 SENSITIVE Sensitive     CLINDAMYCIN <=  0.25 SENSITIVE Sensitive     RIFAMPIN <=0.5 SENSITIVE Sensitive     Inducible Clindamycin NEGATIVE Sensitive     * RARE STAPHYLOCOCCUS AUREUS  Surgical pcr screen     Status: Abnormal   Collection Time: 06/06/17  8:38 PM  Result Value Ref Range Status   MRSA, PCR NEGATIVE NEGATIVE Final   Staphylococcus aureus POSITIVE (A) NEGATIVE Final    Comment: (NOTE) The Xpert SA Assay (FDA approved for NASAL specimens in patients 43 years of age and older), is one component of a comprehensive surveillance program. It is not intended to diagnose infection nor to guide or monitor treatment.   Culture, sputum-assessment     Status: None   Collection Time: 06/07/17  9:14 AM  Result Value Ref Range Status   Specimen Description SPUTUM  Final   Special Requests NONE   Final   Sputum evaluation THIS SPECIMEN IS ACCEPTABLE FOR SPUTUM CULTURE  Final   Report Status 06/07/2017 FINAL  Final  Culture, respiratory (NON-Expectorated)     Status: None   Collection Time: 06/07/17  9:14 AM  Result Value Ref Range Status   Specimen Description SPUTUM  Final   Special Requests NONE Reflexed from [id number]  Final   Gram Stain   Final    FEW WBC PRESENT, PREDOMINANTLY PMN NO ORGANISMS SEEN    Culture MODERATE CANDIDA TROPICALIS  Final   Report Status 06/09/2017 FINAL  Final  Aerobic/Anaerobic Culture (surgical/deep wound)     Status: None (Preliminary result)   Collection Time: 06/07/17  3:32 PM  Result Value Ref Range Status   Specimen Description WOUND  Final   Special Requests CHEST WALL ABSCESS  Final   Gram Stain   Final    ABUNDANT WBC PRESENT, PREDOMINANTLY PMN MODERATE GRAM POSITIVE COCCI    Culture   Final    MODERATE STAPHYLOCOCCUS AUREUS NO ANAEROBES ISOLATED; CULTURE IN PROGRESS FOR 5 DAYS    Report Status PENDING  Incomplete   Organism ID, Bacteria STAPHYLOCOCCUS AUREUS  Final      Susceptibility   Staphylococcus aureus - MIC*    CIPROFLOXACIN <=0.5 SENSITIVE Sensitive     ERYTHROMYCIN <=0.25 SENSITIVE Sensitive     GENTAMICIN <=0.5 SENSITIVE Sensitive     OXACILLIN 0.5 SENSITIVE Sensitive     TETRACYCLINE <=1 SENSITIVE Sensitive     VANCOMYCIN 1 SENSITIVE Sensitive     TRIMETH/SULFA <=10 SENSITIVE Sensitive     CLINDAMYCIN <=0.25 SENSITIVE Sensitive     RIFAMPIN <=0.5 SENSITIVE Sensitive     Inducible Clindamycin NEGATIVE Sensitive     * MODERATE STAPHYLOCOCCUS AUREUS  Aerobic/Anaerobic Culture (surgical/deep wound)     Status: None (Preliminary result)   Collection Time: 06/09/17  6:24 PM  Result Value Ref Range Status   Specimen Description ABSCESS  Final   Special Requests NONE  Final   Gram Stain   Final    ABUNDANT WBC PRESENT, PREDOMINANTLY PMN NO ORGANISMS SEEN    Culture TOO YOUNG TO READ  Final   Report Status PENDING   Incomplete    Radiology Reports Dg Chest 2 View  Result Date: 06/05/2017 CLINICAL DATA:  Shortness of Breath EXAM: CHEST  2 VIEW COMPARISON:  December 01, 2011 FINDINGS: There is patchy infiltrate in the lateral right base. Lungs elsewhere are clear. Heart size and pulmonary vascularity are normal. No adenopathy. No bone lesions. IMPRESSION: Infiltrate consistent with pneumonia lateral right base. Lungs elsewhere clear. Cardiac silhouette within normal limits. Electronically Signed   By: Chrissie Noa  Margarita Grizzle III M.D.   On: 06/05/2017 14:47   Ct Soft Tissue Neck W Contrast  Result Date: 06/05/2017 CLINICAL DATA:  Neck mass.  Hard, red, raised area in the neck. EXAM: CT NECK WITH CONTRAST TECHNIQUE: Multidetector CT imaging of the neck was performed using the standard protocol following the bolus administration of intravenous contrast. CONTRAST:  ISOVUE-300 IOPAMIDOL (ISOVUE-300) INJECTION 61% COMPARISON:  None. FINDINGS: Pharynx and larynx: No pharyngeal or laryngeal mass. Patent airway. No retropharyngeal fluid collection. Salivary glands: No inflammation, mass, or stone. Thyroid: Mass effect on the right thyroid lobe by the inflammatory process described below. No intrinsic thyroid abnormality identified. Lymph nodes: Subcentimeter but asymmetric anterior and posterior cervical lymph nodes throughout the right neck, likely reactive. Vascular: Major vascular structures of the neck are patent. The below described inflammatory process extends into the right carotid space in the lower neck. Limited intracranial: Unremarkable. Visualized orbits: Not imaged. Mastoids and visualized paranasal sinuses: Partially visualized polypoid mucosal thickening or small mucous retention cyst in the left maxillary sinus. Visualized mastoid air cells are clear. Skeleton: There is a heterogeneous gas and fluid collection along the medial aspect of the right sternocleidomastoid muscle in the lower neck beginning near the  clavicular head. This collection extends posteriorly and inferiorly into the anterior mediastinum, more fully evaluated on the separate chest CT. Phlegmon extends superiorly within/along the right sternocleidomastoid muscle in the lower and mid neck. Fluid extends superiorly in the right neck deep to the strap muscles and contains a few locules of gas, however this fluid does not appear as organized as the collections in the lower neck and upper chest. Inflammation in the right neck extends just superior to the level of the thyroid cartilage. No erosive changes are seen at the right sternoclavicular joint. Upper chest: Reported separately. Other: None. IMPRESSION: Gas and fluid collections in the anterior upper chest as reported on separate chest CT, concerning for abscesses. Phlegmon and less organized gas and fluid extend superiorly in the anterior right neck with suspected myositis of the right sternocleidomastoid muscle. Electronically Signed   By: Sebastian Ache M.D.   On: 06/05/2017 16:31   Ct Chest W Contrast  Result Date: 06/05/2017 CLINICAL DATA:  Patient with shortness of breath. Red raised area anterior lower neck. EXAM: CT CHEST WITH CONTRAST TECHNIQUE: Multidetector CT imaging of the chest was performed during intravenous contrast administration. CONTRAST:  ISOVUE-300 IOPAMIDOL (ISOVUE-300) INJECTION 61% COMPARISON:  Chest CT 06/21/2005; neck CT same day. FINDINGS: Cardiovascular: Normal heart size. No pericardial effusion. Normal caliber thoracic aorta. Enlarged main pulmonary artery (3.3 cm) as can be seen with pulmonary arterial hypertension. Mediastinum/Nodes: 1.2 cm AP window lymph node (image 50; series 2). 7 mm right paratracheal lymph node (image 50; series 2). Normal appearance of the esophagus. No axillary adenopathy. Lungs/Pleura: Central airways are patent. Subpleural consolidation with adjacent tree-in-bud ground-glass nodularity within the right lower lobe (image 114; series 4).  Within the right upper lobe there multiple areas of regional ground-glass attenuation. No pleural effusion or pneumothorax. Upper Abdomen: No acute process. Musculoskeletal: There is a large centrally necrotic peripherally enhancing mass within the lower neck which extends from the anterior aspect of the right sternal clavicular joint/distal right sternocleidomastoid superiorly along the right aspect of the thyroid and inferiorly within the retrosternal location. The largest portion of this mass measures 4.7 x 4.1 cm. There are multiple small foci of gas within the mass. No definite osseous destruction demonstrated within the right sternoclavicular joint. IMPRESSION: 1.  There is a large peripherally enhancing mass with central fluid and gas within the right lower neck extending from the anterior aspect of the right sternoclavicular location posteriorly along the retrosternal soft tissues. Findings are concerning for abscess. No definite osseous destruction at this time of the right sternoclavicular joint or adjacent costochondral joint however secondary infection of these joints is not excluded given the size of the suspected abscess. 2. Consolidation within the right lower lobe with right upper lobe ground-glass opacities favored to represent multifocal pneumonia. 3. Mediastinal and right hilar adenopathy likely reactive in etiology. Critical Value/emergent results were called by telephone at the time of interpretation on 06/05/2017 at 4:18 pm to Dr. Fayrene Fearing, who verbally acknowledged these results. Electronically Signed   By: Annia Belt M.D.   On: 06/05/2017 16:29   Mr Hip Left W Wo Contrast  Result Date: 06/06/2017 CLINICAL DATA:  Severe left hip pain for the past few weeks. History of IV drug abuse and recurrent skin and soft tissue infections. EXAM: MRI OF THE LEFT HIP WITHOUT AND WITH CONTRAST TECHNIQUE: Multiplanar, multisequence MR imaging was performed both before and after administration of intravenous  contrast. CONTRAST:  15mL MULTIHANCE GADOBENATE DIMEGLUMINE 529 MG/ML IV SOLN COMPARISON:  None. FINDINGS: Bones: There is no evidence of acute fracture, dislocation or avascular necrosis. The visualized bony pelvis appears normal. The visualized sacroiliac joints and symphysis pubis appear normal. Articular cartilage and labrum Articular cartilage: No focal chondral defect or subchondral signal abnormality identified. Labrum: There is a tear of the left anterior superior labrum. Joint or bursal effusion Joint effusion: Small left hip joint effusion with prominent synovial enhancement. No right hip joint effusion. Bursae: Prominent, rim enhancing fluid within the left iliopsoas bursa, extending superiorly into the left iliacus muscle. The bursa measures up to 2.5 x 3.3 cm in maximal AP by TR dimension. No greater trochanteric bursal fluid. Muscles and tendons Muscles and tendons: Prominent muscle edema and enhancement of the left iliacus muscle. Mild myofascial edema along the left vastus and adductor compartments. The visualized gluteus, hamstring and iliopsoas tendons appear normal. The piriformis muscles appear symmetric. Other findings Miscellaneous: Trace presacral edema. The visualized internal pelvic contents otherwise appear unremarkable. IMPRESSION: 1. Findings consistent with left hip septic arthritis and left iliopsoas septic bursitis. The left iliopsoas bursa extends into the left iliacus muscle, which demonstrates prominent muscle edema and enhancement, consistent with infectious myositis. The preliminary findings were called by telephone at the time of interpretation on 06/06/2017 at 4:00 am to Dr. Marcial Pacas OPYD, by Dr. Elgie Collard. Electronically Signed   By: Obie Dredge M.D.   On: 06/06/2017 08:15   Dg Chest Port 1 View  Result Date: 06/07/2017 CLINICAL DATA:  Shortness of breath, post debridement of RIGHT sternoclavicular abscess EXAM: PORTABLE CHEST 1 VIEW COMPARISON:  Portable exam 1639  hours compared to 06/05/2017 FINDINGS: Normal heart size, mediastinal contours, and pulmonary vascularity. Mild bronchitic changes. Question mild atelectasis versus infiltrate at medial RIGHT lung base. No additional infiltrate, pleural effusion or pneumothorax. No acute osseous findings. IMPRESSION: Mild chronic bronchitic changes with question minimal atelectasis versus infiltrate at medial RIGHT lung base. Electronically Signed   By: Ulyses Southward M.D.   On: 06/07/2017 17:47   Ct Image Guided Drainage By Percutaneous Catheter  Result Date: 06/09/2017 INDICATION: 37 year old male IV drug user with left iliacus abscess. EXAM: CT GUIDED DRAINAGE OF  ABSCESS MEDICATIONS: The patient is currently admitted to the hospital and receiving intravenous antibiotics. The antibiotics were administered within  an appropriate time frame prior to the initiation of the procedure. ANESTHESIA/SEDATION: 4.5 mg IV Versed 200 mcg IV Fentanyl Moderate Sedation Time:  25 minutes The patient was continuously monitored during the procedure by the interventional radiology nurse under my direct supervision. COMPLICATIONS: None immediate. TECHNIQUE: Informed written consent was obtained from the patient after a thorough discussion of the procedural risks, benefits and alternatives. All questions were addressed. Maximal Sterile Barrier Technique was utilized including caps, mask, sterile gowns, sterile gloves, sterile drape, hand hygiene and skin antiseptic. A timeout was performed prior to the initiation of the procedure. PROCEDURE: The operative field was prepped with Chlorhexidine in a sterile fashion, and a sterile drape was applied covering the operative field. A sterile gown and sterile gloves were used for the procedure. Local anesthesia was provided with 1% Lidocaine. A planning axial CT scan was performed. The fluid and gas collection in the left iliac fossa was successfully identified. Local anesthesia was attained by infiltration  with 1% lidocaine. A small dermatotomy was made. Under intermittent CT guidance, an 18 gauge trocar needle was advanced into the fluid collection. A 0.035 Amplatz wire was advanced. The trocar needle was removed. The skin tract was dilated to 12 [place]. A 12 French all-purpose drainage catheter was advanced over the wire and formed. Aspiration yields approximately 30 mL thick purulent fluid. A sample was sent for culture. Catheter was secured to the skin with 0 Prolene suture. A sterile bandage was placed. Post drain placement imaging demonstrates a well-positioned drainage catheter with near-total resolution of the abscess cavity. FINDINGS: Left iliacus abscess. IMPRESSION: Successful placement of a 12 French drainage catheter into the left iliacus abscess. PLAN: Maintain drain to JP bulb suction.  Flush every shift. When drain output is less than 10 mL per day for 2 consecutive days, the drain may be removed. Electronically Signed   By: Malachy Moan M.D.   On: 06/09/2017 18:59    Lab Data:  CBC: Recent Labs  Lab 06/05/17 1444 06/05/17 1457 06/06/17 0315 06/07/17 0210 06/08/17 0541 06/09/17 1237  WBC 23.0*  --  19.7* 18.0* 21.5* 19.4*  NEUTROABS 18.8*  --  15.1*  --   --   --   HGB 13.3 14.6 11.0* 11.2* 11.5* 12.2*  HCT 39.4 43.0 33.1* 33.1* 34.6* 36.4*  MCV 90.6  --  90.2 89.9 89.9 89.4  PLT 318  --  317 334 398 500*   Basic Metabolic Panel: Recent Labs  Lab 06/06/17 0315 06/07/17 0210 06/08/17 0541 06/09/17 1053 06/10/17 0635  NA 134* 134* 136 135 135  K 4.0 3.6 4.1 3.8 3.4*  CL 99* 97* 100* 102 102  CO2 24 27 24 22 22   GLUCOSE 82 123* 168* 159* 121*  BUN 7 6 10 13 10   CREATININE 0.58* 0.64 0.62 0.67 0.62  CALCIUM 8.1* 8.2* 8.6* 8.8* 8.5*   GFR: Estimated Creatinine Clearance: 128.2 mL/min (by C-G formula based on SCr of 0.62 mg/dL). Liver Function Tests: Recent Labs  Lab 06/05/17 1444 06/06/17 0315 06/07/17 0210 06/09/17 1053  AST 67* 43* 41 42*  ALT 63 47 39  36  ALKPHOS 201* 176* 185* 165*  BILITOT 0.5 0.3 0.3 0.6  PROT 7.5 6.4* 5.6* 6.6  ALBUMIN 2.9* 2.3* 2.1* 2.6*   No results for input(s): LIPASE, AMYLASE in the last 168 hours. No results for input(s): AMMONIA in the last 168 hours. Coagulation Profile: Recent Labs  Lab 06/07/17 0210  INR 1.11   Cardiac Enzymes: No results for  input(s): CKTOTAL, CKMB, CKMBINDEX, TROPONINI in the last 168 hours. BNP (last 3 results) No results for input(s): PROBNP in the last 8760 hours. HbA1C: No results for input(s): HGBA1C in the last 72 hours. CBG: No results for input(s): GLUCAP in the last 168 hours. Lipid Profile: No results for input(s): CHOL, HDL, LDLCALC, TRIG, CHOLHDL, LDLDIRECT in the last 72 hours. Thyroid Function Tests: No results for input(s): TSH, T4TOTAL, FREET4, T3FREE, THYROIDAB in the last 72 hours. Anemia Panel: No results for input(s): VITAMINB12, FOLATE, FERRITIN, TIBC, IRON, RETICCTPCT in the last 72 hours. Urine analysis:    Component Value Date/Time   COLORURINE YELLOW 06/06/2017 1333   APPEARANCEUR CLEAR 06/06/2017 1333   LABSPEC 1.010 06/06/2017 1333   PHURINE 6.0 06/06/2017 1333   GLUCOSEU NEGATIVE 06/06/2017 1333   HGBUR NEGATIVE 06/06/2017 1333   BILIRUBINUR NEGATIVE 06/06/2017 1333   KETONESUR NEGATIVE 06/06/2017 1333   PROTEINUR NEGATIVE 06/06/2017 1333   UROBILINOGEN 0.2 10/03/2012 0856   NITRITE NEGATIVE 06/06/2017 1333   LEUKOCYTESUR NEGATIVE 06/06/2017 1333     Lycia Sachdeva M.D. [facility] 06/10/2017, 2:19 PM  Pager: [phone] Between 7am to 7pm - call Pager - [phone]  After 7pm go to [email or web address] - password [email or web address]  Call night coverage person covering after 7pm

## 2017-06-10 NOTE — Progress Notes (Signed)
Preliminary results by tech - Left Lower Ext. Venous Duplex Complete. Negative for deep and superficial vein thrombosis. Marilynne Halstedita Teron Blais, BS, RDMS, RVT

## 2017-06-10 NOTE — Progress Notes (Signed)
Patient adamant about leaving floor which he has been doing the previous days as he  claimed .  Advised him and his spouse not to .  Dr. Isidoro Donningai notified. Pt does not have off the floor privileges per MD.

## 2017-06-10 NOTE — Progress Notes (Signed)
Referring Physician(s): Dr. August Saucerean  Supervising Physician: Richarda OverlieHenn, Adam  Patient Status:  [facility]  Chief Complaint: Iliacus abscess  Subjective: No complaints at time of visit.  Resting comfortably.   Allergies: Patient has no known allergies.  Medications: Prior to Admission medications   Medication Sig Start Date End Date Taking? Authorizing Provider  Aspirin-Salicylamide-Caffeine (BC HEADACHE POWDER PO) Take 2-6 packets by mouth daily as needed (pain).   Yes [provider]  HYDROcodone-acetaminophen (NORCO/VICODIN) 5-325 MG per tablet 1 or 2 tabs PO q6 hours prn pain Patient taking differently: Take 1-2 tablets by mouth every 6 (six) hours as needed for moderate pain.  10/03/12  Yes Samuel JesterMcManus, Kathleen, DO  ibuprofen (ADVIL,MOTRIN) 200 MG tablet Take 200-400 mg by mouth every 6 (six) hours as needed for mild pain.   Yes [provider]     Vital Signs: BP 133/80 (BP Location: Right Arm)   Pulse 69   Temp 98.9 F (37.2 C) (Oral)   Resp 15   Ht 5\' 10"  (1.778 m)   Wt 154 lb 15.7 oz (70.3 kg)   SpO2 98%   BMI 22.24 kg/m   Physical Exam  NAD, alert Abd:  Drain intact.  Insertion site c/d/i.  Thin, bloody fluid in collection bulb. 50 mL output recorded yesterday.   Imaging: Dg Chest Port 1 View  Result Date: 06/07/2017 CLINICAL DATA:  Shortness of breath, post debridement of RIGHT sternoclavicular abscess EXAM: PORTABLE CHEST 1 VIEW COMPARISON:  Portable exam 1639 hours compared to 06/05/2017 FINDINGS: Normal heart size, mediastinal contours, and pulmonary vascularity. Mild bronchitic changes. Question mild atelectasis versus infiltrate at medial RIGHT lung base. No additional infiltrate, pleural effusion or pneumothorax. No acute osseous findings. IMPRESSION: Mild chronic bronchitic changes with question minimal atelectasis versus infiltrate at medial RIGHT lung base. Electronically Signed   By: Ulyses SouthwardMark  Boles M.D.   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 EXAM: CT GUIDED DRAINAGE OF  ABSCESS MEDICATIONS: The patient is currently admitted to the hospital and receiving intravenous antibiotics. The antibiotics were administered within an appropriate time frame prior to the initiation of the procedure. ANESTHESIA/SEDATION: 4.5 mg IV Versed 200 mcg IV Fentanyl Moderate Sedation Time:  25 minutes The patient was continuously monitored during the procedure by the interventional radiology nurse under my direct supervision. COMPLICATIONS: None immediate. TECHNIQUE: Informed written consent was obtained from the patient after a thorough discussion of the procedural risks, benefits and alternatives. All questions were addressed. Maximal Sterile Barrier Technique was utilized including caps, mask, sterile gowns, sterile gloves, sterile drape, hand hygiene and skin antiseptic. A timeout was performed prior to the initiation of the procedure. PROCEDURE: The operative field was prepped with Chlorhexidine in a sterile fashion, and a sterile drape was applied covering the operative field. A sterile gown and sterile gloves were used for the procedure. Local anesthesia was provided with 1% Lidocaine. A planning axial CT scan was performed. The fluid and gas collection in the left iliac fossa was successfully identified. Local anesthesia was attained by infiltration with 1% lidocaine. A small dermatotomy was made. Under intermittent CT guidance, an 18 gauge trocar needle was advanced into the fluid collection. A 0.035 Amplatz wire was advanced. The trocar needle was removed. The skin tract was dilated to 12 [place]. A 12 French all-purpose drainage catheter was advanced over the wire and formed. Aspiration yields  approximately 30 mL thick purulent fluid. A sample was sent for culture. Catheter was secured to the skin with 0 Prolene suture. A sterile  bandage was placed. Post drain placement imaging demonstrates a well-positioned drainage catheter with near-total resolution of the abscess cavity. FINDINGS: Left iliacus abscess. IMPRESSION: Successful placement of a 12 French drainage catheter into the left iliacus abscess. PLAN: Maintain drain to JP bulb suction.  Flush every shift. When drain output is less than 10 mL per day for 2 consecutive days, the drain may be removed. Electronically Signed   By: Malachy Moan M.D.   On: 06/09/2017 18:59    Labs:  CBC: Recent Labs    06/06/17 0315 06/07/17 0210 06/08/17 0541 06/09/17 1237  WBC 19.7* 18.0* 21.5* 19.4*  HGB 11.0* 11.2* 11.5* 12.2*  HCT 33.1* 33.1* 34.6* 36.4*  PLT 317 334 398 500*    COAGS: Recent Labs    06/07/17 0210  INR 1.11  APTT 39*    BMP: Recent Labs    06/07/17 0210 06/08/17 0541 06/09/17 1053 06/10/17 0635  NA 134* 136 135 135  K 3.6 4.1 3.8 3.4*  CL 97* 100* 102 102  CO2 27 24 22 22   GLUCOSE 123* 168* 159* 121*  BUN 6 10 13 10   CALCIUM 8.2* 8.6* 8.8* 8.5*  CREATININE 0.64 0.62 0.67 0.62  GFRNONAA >60 >60 >60 >60  GFRAA >60 >60 >60 >60    LIVER FUNCTION TESTS: Recent Labs    06/05/17 1444 06/06/17 0315 06/07/17 0210 06/09/17 1053  BILITOT 0.5 0.3 0.3 0.6  AST 67* 43* 41 42*  ALT 63 47 39 36  ALKPHOS 201* 176* 185* 165*  PROT 7.5 6.4* 5.6* 6.6  ALBUMIN 2.9* 2.3* 2.1* 2.6*    Assessment and Plan: Iliacus abscess s/p drain placement 1/18 Drain intact.  Insertion site c/d/i.  Slight improvement in WBC overnight. Disposition pending.  IR to follow.    Electronically Signed: Hoyt Koch, PA 06/10/2017, 1:57 PM   I spent a total of 15 Minutes at the the patient's bedside AND on the patient's hospital floor or unit, greater than 50% of which was counseling/coordinating care for iliacus abscess.

## 2017-06-11 LAB — AEROBIC/ANAEROBIC CULTURE W GRAM STAIN (SURGICAL/DEEP WOUND): Gram Stain: NONE SEEN

## 2017-06-11 LAB — CULTURE, BLOOD (ROUTINE X 2)
CULTURE: NO GROWTH
Culture: NO GROWTH
SPECIAL REQUESTS: ADEQUATE
Special Requests: ADEQUATE

## 2017-06-11 LAB — BASIC METABOLIC PANEL
Anion gap: 11 (ref 5–15)
BUN: 7 mg/dL (ref 6–20)
CO2: 22 mmol/L (ref 22–32)
Calcium: 8.8 mg/dL — ABNORMAL LOW (ref 8.9–10.3)
Chloride: 103 mmol/L (ref 101–111)
Creatinine, Ser: 0.56 mg/dL — ABNORMAL LOW (ref 0.61–1.24)
GFR calc Af Amer: >60 mL/min (ref >60)
GFR calc non Af Amer: >60 mL/min (ref >60)
Glucose, Bld: 105 mg/dL — ABNORMAL HIGH (ref 65–99)
Potassium: 4 mmol/L (ref 3.5–5.1)
Sodium: 136 mmol/L (ref 135–145)

## 2017-06-11 LAB — AEROBIC/ANAEROBIC CULTURE (SURGICAL/DEEP WOUND)

## 2017-06-11 MED ORDER — ZOLPIDEM TARTRATE 5 MG PO TABS
10.0000 mg | ORAL_TABLET | Freq: Every evening | ORAL | Status: DC | PRN
  Administered 2017-06-11 – 2017-08-01 (×52): 10 mg via ORAL
  Filled 2017-06-11 (×52): qty 2

## 2017-06-11 MED ORDER — HYDROMORPHONE HCL 1 MG/ML IJ SOLN
1.0000 mg | INTRAMUSCULAR | Status: DC | PRN
  Administered 2017-06-11 – 2017-06-16 (×33): 1 mg via INTRAVENOUS
  Filled 2017-06-11 (×35): qty 1

## 2017-06-11 NOTE — Progress Notes (Signed)
Pt/family asked if he could go downstairs, informed him that he had a "no off unit" order.

## 2017-06-11 NOTE — Progress Notes (Signed)
      [address]       [place], [id number]             [phone]      4 Days Post-Op Procedure(s) (LRB): DEBRIDEMENT RIGHT STERNOCLAVICULAR ABSCESS (Right) APPLICATION OF WOUND VAC (Right)   Subjective:  Patient with a lot of discomfort, didn't get much sleep.  He states the fentanyl isn't providing much relief.  Objective: Vital signs in last 24 hours: Temp:  [98.3 F (36.8 C)-99 F (37.2 C)] 98.3 F (36.8 C) (01/20 0403) Pulse Rate:  [68-71] 68 (01/20 0403) Resp:  [15-16] 16 (01/20 0403) BP: (126-133)/(74-80) 128/76 (01/20 0403) SpO2:  [95 %-98 %] 95 % (01/20 0403)  Intake/Output from previous day: 01/19 0701 - 01/20 0700 In: 347 [P.O.:342] Out: 1146 [Urine:1100; Drains:45; Stool:1] Intake/Output this shift: Total I/O In: 25 [P.O.:25] Out: -   General appearance: alert, cooperative and no distress Heart: regular rate and rhythm Lungs: clear to auscultation bilaterally Wound: wound vac in place, no erythema around wound  Lab Results: Recent Labs    06/09/17 1237  WBC 19.4*  HGB 12.2*  HCT 36.4*  PLT 500*   BMET:  Recent Labs    06/09/17 1053 06/10/17 0635  NA 135 135  K 3.8 3.4*  CL 102 102  CO2 22 22  GLUCOSE 159* 121*  BUN 13 10  CREATININE 0.67 0.62  CALCIUM 8.8* 8.5*    PT/INR: No results for input(s): LABPROT, INR in the last 72 hours. ABG    Component Value Date/Time   PHART 7.430 06/07/2017 0940   HCO3 27.5 06/07/2017 0940   TCO2 29 06/05/2017 1457   ACIDBASEDEF 4.0 (H) 03/13/2007 0120   O2SAT 90.2 06/07/2017 0940   CBG (last 3)  No results for input(s): GLUCAP in the last 72 hours.  Assessment/Plan: S/P Procedure(s) (LRB): DEBRIDEMENT RIGHT STERNOCLAVICULAR ABSCESS (Right) APPLICATION OF WOUND VAC (Right)  1. Sternoclavicular joint infection- wound vac in place, will plan for change tomorrow 2. Pain control- patient with a lot of discomfort, states fentanyl isn't working, states he is able to get some  relief from dilaudid, will change frequency to every 3 hours, may want to discontinue fentanyl if not effective 3. ID- afebrile, continue ABX 4. Dispo- care per primary, wound vac change tomorrow   LOS: 6 days    Lowella Dandyrin Olegario Emberson 06/11/2017

## 2017-06-11 NOTE — Progress Notes (Signed)
Pt does not walk in the hall.

## 2017-06-11 NOTE — Progress Notes (Signed)
[facility]                                                                              Patient Demographics  Clifford Tucker, is a 37 y.o. male, DOB - Mar 13, 1981, WUJ:[id number]  Admit date - 06/05/2017   Admitting Physician Briscoe Deutscher, MD  Outpatient Primary MD for the patient is Patient, No Pcp Per  Outpatient specialists:   LOS - 6  days   Medical records reviewed and are as summarized below:    Chief Complaint  Patient presents with  . Oral Swelling       Brief summary   Clifford Tucker is a 37 y.o. malewith medical history significant forIV drug abuse and recurrent skin and soft tissue infections. He presented with neck/hip pain found to have a neck abscess, septic arthritis of the hip and now MSSA bacteremia. CT surgery, orthopedic surgery and infectious disease consulted.   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  Reported having history of multiple abscesses over the last few weeks -Patient was originally placed on vancomycin and Zosyn, transition to IV Ancef -Sternal aspirate showed MSSA, left hip wound cultures also MSSA -2D echo with no vegetation.  ID following, recommended continue cefazolin - States pain is not well controlled, 8-9/10 pain, did not get enough sleep last night, increased Dilaudid, Ambien. Monitor closely for any respiratory depression  Active Problems:   Multifocal pneumonia -ID following, continue cefazolin  Left hip abscess/iliopsoas septic arthritis, infectious myositis -Status post left hip I&D with cultures done on 1/15, showing staph aureus -Continue Ancef -Status post drain placement 1/18 for iliacus abscess -Doppler ultrasound of the lower extremity, left negative for any DVT  Septic arthritis  of the sternoclavicular joint, abscess -CTVS following, underwent debridement with wound VAC of the neck abscess    IV drug user -Per patient, has been using heroin, [facility] but relapsed a month ago  -Last heroin use a week ago, so far not in any withdrawals, monitor closely -Social work consulted for rehab  Nicotine abuse -Nicotine patch placed   Code Status: Full code DVT Prophylaxis: Lovenox Family Communication: Discussed in detail with the patient, all imaging results, lab results explained to the patient and girlfriend in the room   Disposition Plan:   Time Spent in minutes   15 minutes  Procedures:  I&D left hip 1/15  Consultants:   Orthopedics CT VS ID  Antimicrobials:      Medications  Scheduled Meds: . acetaminophen  1,000 mg Oral Q6H   Or  . acetaminophen (TYLENOL) oral liquid 160 mg/5 mL  1,000 mg Oral Q6H  . Chlorhexidine Gluconate Cloth  6 each Topical Daily  . enoxaparin (LOVENOX) injection  40 mg Subcutaneous Q24H  . iopamidol  20 mL Intra-articular Once  . lidocaine (PF)  5 mL Intradermal Once  . mupirocin ointment  1 application Nasal BID  .  nicotine  21 mg Transdermal Daily  . pantoprazole  40 mg Oral Q0600  . sodium chloride  10 mL Intravenous Once  . sodium chloride flush  5 mL Intravenous Q8H   Continuous Infusions: . sodium chloride    .  ceFAZolin (ANCEF) IV Stopped (06/11/17 0235)  . dextrose 5 % and 0.45% NaCl 30 mL/hr at 06/07/17 2000  . lactated ringers 10 mL/hr at 06/06/17 1406  . lactated ringers 10 mL/hr at 06/07/17 1354  . potassium chloride     PRN Meds:.acetaminophen **OR** acetaminophen, alum & mag hydroxide-simeth, diphenhydrAMINE, fentaNYL (SUBLIMAZE) injection, HYDROmorphone (DILAUDID) injection, metoCLOPramide **OR** metoCLOPramide (REGLAN) injection, ondansetron **OR** ondansetron (ZOFRAN) IV, oxyCODONE, potassium chloride, senna-docusate, zolpidem   Antibiotics   Anti-infectives (From  admission, onward)   Start     Dose/Rate Route Frequency Ordered Stop   06/07/17 1400  cefUROXime (ZINACEF) 1.5 g in dextrose 5 % 50 mL IVPB     1.5 g 100 mL/hr over 30 Minutes Intravenous To [facility] 06/07/17 0125 06/07/17 1527   06/07/17 0745  vancomycin (VANCOCIN) 1,000 mg in sodium chloride 0.9 % 1,000 mL irrigation      Irrigation To Surgery 06/07/17 0744 06/07/17 1513   06/06/17 1639  gentamicin (GARAMYCIN) injection  Status:  Discontinued       As needed 06/06/17 1640 06/06/17 1715   06/06/17 1638  vancomycin (VANCOCIN) powder  Status:  Discontinued       As needed 06/06/17 1639 06/06/17 1715   06/06/17 1000  ceFAZolin (ANCEF) IVPB 2g/100 mL premix     2 g 200 mL/hr over 30 Minutes Intravenous Every 8 hours 06/06/17 0911     06/06/17 0000  vancomycin (VANCOCIN) IVPB 1000 mg/200 mL premix  Status:  Discontinued     1,000 mg 200 mL/hr over 60 Minutes Intravenous Every 8 hours 06/05/17 2211 06/06/17 0911   06/05/17 2330  piperacillin-tazobactam (ZOSYN) IVPB 3.375 g  Status:  Discontinued     3.375 g 12.5 mL/hr over 240 Minutes Intravenous Every 8 hours 06/05/17 2211 06/06/17 0911   06/05/17 2200  cefTRIAXone (ROCEPHIN) 1 g in dextrose 5 % 50 mL IVPB  Status:  Discontinued     1 g 100 mL/hr over 30 Minutes Intravenous Every 24 hours 06/05/17 2140 06/05/17 2202   06/05/17 2200  azithromycin (ZITHROMAX) 500 mg in dextrose 5 % 250 mL IVPB  Status:  Discontinued     500 mg 250 mL/hr over 60 Minutes Intravenous Every 24 hours 06/05/17 2140 06/06/17 0911   06/05/17 1600  vancomycin (VANCOCIN) 1,500 mg in sodium chloride 0.9 % 500 mL IVPB     1,500 mg 250 mL/hr over 120 Minutes Intravenous  Once 06/05/17 1542 06/05/17 1855   06/05/17 1515  piperacillin-tazobactam (ZOSYN) IVPB 3.375 g     3.375 g 100 mL/hr over 30 Minutes Intravenous  Once 06/05/17 1512 06/05/17 1557        Subjective:   Clifford Tucker was seen and examined today. States pain not well controlled, 8-9  out of 10, didn't get enough sleep last night. Patient denies dizziness, chest pain, shortness of breath, abdominal pain, N/V/D/C, new weakness, numbess, tingling.    Objective:   Vitals:   06/10/17 0521 06/10/17 1337 06/10/17 2057 06/11/17 0403  BP: 129/70 133/80 126/74 128/76  Pulse: (!) 54 69 71 68  Resp: 14 15 16 16   Temp: 98.2 F (36.8 C) 98.9 F (37.2 C) 99 F (37.2 C) 98.3 F (36.8 C)  TempSrc: Oral Oral Oral  Oral  SpO2: 97% 98% 98% 95%  Weight:      Height:        Intake/Output Summary (Last 24 hours) at 06/11/2017 1044 Last data filed at 06/11/2017 0847 Gross per 24 hour  Intake 372 ml  Output 1146 ml  Net -774 ml     Wt Readings from Last 3 Encounters:  06/06/17 70.3 kg (154 lb 15.7 oz)  10/03/12 72.6 kg (160 lb)     Exam    General: Alert and oriented x 3, NAD  Eyes:   HEENT:  wound VAC +  Cardiovascular: S1 S2 clear, RRR, No pedal edema b/l  Respiratory: CTA B  Gastrointestinal: Soft, nontender, nondistended, + bowel sounds  Ext: no pedal edema bilaterally  Neuro: no new deficits  Musculoskeletal: No digital cyanosis, clubbing  Skin: No rashes  Psych: Normal affect and demeanor, alert and oriented x3   Data Reviewed:  I have personally reviewed following labs and imaging studies  Micro Results Recent Results (from the past 240 hour(s))  Blood culture (routine x 2)     Status: Abnormal   Collection Time: 06/05/17  2:44 PM  Result Value Ref Range Status   Specimen Description RIGHT ANTECUBITAL  Final   Special Requests   Final    BOTTLES DRAWN AEROBIC ONLY Blood Culture adequate volume   Culture  Setup Time   Final    GRAM POSITIVE COCCI Gram Stain Report Called to,Read Back By and Verified With: ROWE,C AT 0730 BY HUFFINES,S ON 06/06/17. AEROBIC BOTTLE ONLY    Culture (A)  Final    STAPHYLOCOCCUS AUREUS SUSCEPTIBILITIES PERFORMED ON PREVIOUS CULTURE WITHIN THE LAST 5 DAYS. Performed at [facility], [address],  [place], [place] [address]    Report Status 06/08/2017 FINAL  Final  Blood culture (routine x 2)     Status: Abnormal   Collection Time: 06/05/17  2:47 PM  Result Value Ref Range Status   Specimen Description LEFT ANTECUBITAL  Final   Special Requests   Final    BOTTLES DRAWN AEROBIC AND ANAEROBIC Blood Culture adequate volume   Culture  Setup Time   Final    GRAM POSITIVE COCCI BOTH AEB AND ANA Gram Stain Report Called to,Read Back By and Verified With: J.CRUISE, RN @ [facility] ED @ 0545 ON 1.15.19 BY BOWMAN,L CRITICAL RESULT CALLED TO, READ BACK BY AND VERIFIED WITH: N. Batchelder Pharm.D. 8:55 06/06/17 ([email or web address]) Performed at [facility], [address], [place], [place] [address]    Culture STAPHYLOCOCCUS AUREUS (A)  Final   Report Status 06/08/2017 FINAL  Final   Organism ID, Bacteria STAPHYLOCOCCUS AUREUS  Final      Susceptibility   Staphylococcus aureus - MIC*    CIPROFLOXACIN <=0.5 SENSITIVE Sensitive     ERYTHROMYCIN <=0.25 SENSITIVE Sensitive     GENTAMICIN <=0.5 SENSITIVE Sensitive     OXACILLIN <=0.25 SENSITIVE Sensitive     TETRACYCLINE <=1 SENSITIVE Sensitive     VANCOMYCIN <=0.5 SENSITIVE Sensitive     TRIMETH/SULFA <=10 SENSITIVE Sensitive     CLINDAMYCIN <=0.25 SENSITIVE Sensitive     RIFAMPIN <=0.5 SENSITIVE Sensitive     Inducible Clindamycin NEGATIVE Sensitive     * STAPHYLOCOCCUS AUREUS  Blood Culture ID Panel (Reflexed)     Status: Abnormal   Collection Time: 06/05/17  2:47 PM  Result Value Ref Range Status   Enterococcus species NOT DETECTED NOT DETECTED Final   Listeria monocytogenes NOT DETECTED NOT DETECTED Final  Staphylococcus species DETECTED (A) NOT DETECTED Final    Comment: CRITICAL RESULT CALLED TO, READ BACK BY AND VERIFIED WITH: N. Batchelder Pharm.D. 8:55 06/06/17 ([email or web address])    Staphylococcus aureus DETECTED (A) NOT DETECTED Final    Comment: Methicillin (oxacillin) susceptible Staphylococcus aureus (MSSA). Preferred therapy is anti staphylococcal  beta lactam antibiotic (Cefazolin or Nafcillin), unless clinically contraindicated. CRITICAL RESULT CALLED TO, READ BACK BY AND VERIFIED WITH: N. Batchelder Pharm.D. 8:55 06/06/17 (wilsonm)    Methicillin resistance NOT DETECTED NOT DETECTED Final   Streptococcus species NOT DETECTED NOT DETECTED Final   Streptococcus agalactiae NOT DETECTED NOT DETECTED Final   Streptococcus pneumoniae NOT DETECTED NOT DETECTED Final   Streptococcus pyogenes NOT DETECTED NOT DETECTED Final   Acinetobacter baumannii NOT DETECTED NOT DETECTED Final   Enterobacteriaceae species NOT DETECTED NOT DETECTED Final   Enterobacter cloacae complex NOT DETECTED NOT DETECTED Final   Escherichia coli NOT DETECTED NOT DETECTED Final   Klebsiella oxytoca NOT DETECTED NOT DETECTED Final   Klebsiella pneumoniae NOT DETECTED NOT DETECTED Final   Proteus species NOT DETECTED NOT DETECTED Final   Serratia marcescens NOT DETECTED NOT DETECTED Final   Haemophilus influenzae NOT DETECTED NOT DETECTED Final   Neisseria meningitidis NOT DETECTED NOT DETECTED Final   Pseudomonas aeruginosa NOT DETECTED NOT DETECTED Final   Candida albicans NOT DETECTED NOT DETECTED Final   Candida glabrata NOT DETECTED NOT DETECTED Final   Candida krusei NOT DETECTED NOT DETECTED Final   Candida parapsilosis NOT DETECTED NOT DETECTED Final   Candida tropicalis NOT DETECTED NOT DETECTED Final  Culture, blood (routine x 2)     Status: None (Preliminary result)   Collection Time: 06/06/17  1:45 PM  Result Value Ref Range Status   Specimen Description BLOOD RIGHT ANTECUBITAL  Final   Special Requests   Final    BOTTLES DRAWN AEROBIC AND ANAEROBIC Blood Culture adequate volume   Culture NO GROWTH 4 DAYS  Final   Report Status PENDING  Incomplete  Culture, blood (routine x 2)     Status: None (Preliminary result)   Collection Time: 06/06/17  1:54 PM  Result Value Ref Range Status   Specimen Description BLOOD LEFT ANTECUBITAL  Final   Special  Requests   Final    BOTTLES DRAWN AEROBIC AND ANAEROBIC Blood Culture adequate volume   Culture NO GROWTH 4 DAYS  Final   Report Status PENDING  Incomplete  Aerobic/Anaerobic Culture (surgical/deep wound)     Status: None (Preliminary result)   Collection Time: 06/06/17  4:09 PM  Result Value Ref Range Status   Specimen Description WOUND LEFT HIP  Final   Special Requests PT ON VANC ANCEF ROCEPHIN ZOSYN  Final   Gram Stain   Final    NO WBC SEEN NO SQUAMOUS EPITHELIAL CELLS SEEN NO ORGANISMS SEEN    Culture   Final    RARE STAPHYLOCOCCUS AUREUS NO ANAEROBES ISOLATED; CULTURE IN PROGRESS FOR 5 DAYS    Report Status PENDING  Incomplete   Organism ID, Bacteria STAPHYLOCOCCUS AUREUS  Final      Susceptibility   Staphylococcus aureus - MIC*    CIPROFLOXACIN <=0.5 SENSITIVE Sensitive     ERYTHROMYCIN <=0.25 SENSITIVE Sensitive     GENTAMICIN <=0.5 SENSITIVE Sensitive     OXACILLIN 0.5 SENSITIVE Sensitive     TETRACYCLINE <=1 SENSITIVE Sensitive     VANCOMYCIN <=0.5 SENSITIVE Sensitive     TRIMETH/SULFA <=10 SENSITIVE Sensitive     CLINDAMYCIN <=0.25 SENSITIVE Sensitive  RIFAMPIN <=0.5 SENSITIVE Sensitive     Inducible Clindamycin NEGATIVE Sensitive     * RARE STAPHYLOCOCCUS AUREUS  Surgical pcr screen     Status: Abnormal   Collection Time: 06/06/17  8:38 PM  Result Value Ref Range Status   MRSA, PCR NEGATIVE NEGATIVE Final   Staphylococcus aureus POSITIVE (A) NEGATIVE Final    Comment: (NOTE) The Xpert SA Assay (FDA approved for NASAL specimens in patients 38 years of age and older), is one component of a comprehensive surveillance program. It is not intended to diagnose infection nor to guide or monitor treatment.   Culture, sputum-assessment     Status: None   Collection Time: 06/07/17  9:14 AM  Result Value Ref Range Status   Specimen Description SPUTUM  Final   Special Requests NONE  Final   Sputum evaluation THIS SPECIMEN IS ACCEPTABLE FOR SPUTUM CULTURE  Final    Report Status 06/07/2017 FINAL  Final  Culture, respiratory (NON-Expectorated)     Status: None   Collection Time: 06/07/17  9:14 AM  Result Value Ref Range Status   Specimen Description SPUTUM  Final   Special Requests NONE Reflexed from [id number]  Final   Gram Stain   Final    FEW WBC PRESENT, PREDOMINANTLY PMN NO ORGANISMS SEEN    Culture MODERATE CANDIDA TROPICALIS  Final   Report Status 06/09/2017 FINAL  Final  Aerobic/Anaerobic Culture (surgical/deep wound)     Status: None (Preliminary result)   Collection Time: 06/07/17  3:32 PM  Result Value Ref Range Status   Specimen Description WOUND  Final   Special Requests CHEST WALL ABSCESS  Final   Gram Stain   Final    ABUNDANT WBC PRESENT, PREDOMINANTLY PMN MODERATE GRAM POSITIVE COCCI    Culture   Final    MODERATE STAPHYLOCOCCUS AUREUS NO ANAEROBES ISOLATED; CULTURE IN PROGRESS FOR 5 DAYS    Report Status PENDING  Incomplete   Organism ID, Bacteria STAPHYLOCOCCUS AUREUS  Final      Susceptibility   Staphylococcus aureus - MIC*    CIPROFLOXACIN <=0.5 SENSITIVE Sensitive     ERYTHROMYCIN <=0.25 SENSITIVE Sensitive     GENTAMICIN <=0.5 SENSITIVE Sensitive     OXACILLIN 0.5 SENSITIVE Sensitive     TETRACYCLINE <=1 SENSITIVE Sensitive     VANCOMYCIN 1 SENSITIVE Sensitive     TRIMETH/SULFA <=10 SENSITIVE Sensitive     CLINDAMYCIN <=0.25 SENSITIVE Sensitive     RIFAMPIN <=0.5 SENSITIVE Sensitive     Inducible Clindamycin NEGATIVE Sensitive     * MODERATE STAPHYLOCOCCUS AUREUS  Aerobic/Anaerobic Culture (surgical/deep wound)     Status: None (Preliminary result)   Collection Time: 06/09/17  6:24 PM  Result Value Ref Range Status   Specimen Description ABSCESS  Final   Special Requests NONE  Final   Gram Stain   Final    ABUNDANT WBC PRESENT, PREDOMINANTLY PMN NO ORGANISMS SEEN    Culture TOO YOUNG TO READ  Final   Report Status PENDING  Incomplete    Radiology Reports Dg Chest 2 View  Result Date: 06/05/2017 CLINICAL  DATA:  Shortness of Breath EXAM: CHEST  2 VIEW COMPARISON:  December 01, 2011 FINDINGS: There is patchy infiltrate in the lateral right base. Lungs elsewhere are clear. Heart size and pulmonary vascularity are normal. No adenopathy. No bone lesions. IMPRESSION: Infiltrate consistent with pneumonia lateral right base. Lungs elsewhere clear. Cardiac silhouette within normal limits. Electronically Signed   By: Bretta Bang III M.D.   On: 06/05/2017  14:47   Ct Soft Tissue Neck W Contrast  Result Date: 06/05/2017 CLINICAL DATA:  Neck mass.  Hard, red, raised area in the neck. EXAM: CT NECK WITH CONTRAST TECHNIQUE: Multidetector CT imaging of the neck was performed using the standard protocol following the bolus administration of intravenous contrast. CONTRAST:  ISOVUE-300 IOPAMIDOL (ISOVUE-300) INJECTION 61% COMPARISON:  None. FINDINGS: Pharynx and larynx: No pharyngeal or laryngeal mass. Patent airway. No retropharyngeal fluid collection. Salivary glands: No inflammation, mass, or stone. Thyroid: Mass effect on the right thyroid lobe by the inflammatory process described below. No intrinsic thyroid abnormality identified. Lymph nodes: Subcentimeter but asymmetric anterior and posterior cervical lymph nodes throughout the right neck, likely reactive. Vascular: Major vascular structures of the neck are patent. The below described inflammatory process extends into the right carotid space in the lower neck. Limited intracranial: Unremarkable. Visualized orbits: Not imaged. Mastoids and visualized paranasal sinuses: Partially visualized polypoid mucosal thickening or small mucous retention cyst in the left maxillary sinus. Visualized mastoid air cells are clear. Skeleton: There is a heterogeneous gas and fluid collection along the medial aspect of the right sternocleidomastoid muscle in the lower neck beginning near the clavicular head. This collection extends posteriorly and inferiorly into the anterior  mediastinum, more fully evaluated on the separate chest CT. Phlegmon extends superiorly within/along the right sternocleidomastoid muscle in the lower and mid neck. Fluid extends superiorly in the right neck deep to the strap muscles and contains a few locules of gas, however this fluid does not appear as organized as the collections in the lower neck and upper chest. Inflammation in the right neck extends just superior to the level of the thyroid cartilage. No erosive changes are seen at the right sternoclavicular joint. Upper chest: Reported separately. Other: None. IMPRESSION: Gas and fluid collections in the anterior upper chest as reported on separate chest CT, concerning for abscesses. Phlegmon and less organized gas and fluid extend superiorly in the anterior right neck with suspected myositis of the right sternocleidomastoid muscle. Electronically Signed   By: Sebastian Ache M.D.   On: 06/05/2017 16:31   Ct Chest W Contrast  Result Date: 06/05/2017 CLINICAL DATA:  Patient with shortness of breath. Red raised area anterior lower neck. EXAM: CT CHEST WITH CONTRAST TECHNIQUE: Multidetector CT imaging of the chest was performed during intravenous contrast administration. CONTRAST:  ISOVUE-300 IOPAMIDOL (ISOVUE-300) INJECTION 61% COMPARISON:  Chest CT 06/21/2005; neck CT same day. FINDINGS: Cardiovascular: Normal heart size. No pericardial effusion. Normal caliber thoracic aorta. Enlarged main pulmonary artery (3.3 cm) as can be seen with pulmonary arterial hypertension. Mediastinum/Nodes: 1.2 cm AP window lymph node (image 50; series 2). 7 mm right paratracheal lymph node (image 50; series 2). Normal appearance of the esophagus. No axillary adenopathy. Lungs/Pleura: Central airways are patent. Subpleural consolidation with adjacent tree-in-bud ground-glass nodularity within the right lower lobe (image 114; series 4). Within the right upper lobe there multiple areas of regional ground-glass attenuation.  No pleural effusion or pneumothorax. Upper Abdomen: No acute process. Musculoskeletal: There is a large centrally necrotic peripherally enhancing mass within the lower neck which extends from the anterior aspect of the right sternal clavicular joint/distal right sternocleidomastoid superiorly along the right aspect of the thyroid and inferiorly within the retrosternal location. The largest portion of this mass measures 4.7 x 4.1 cm. There are multiple small foci of gas within the mass. No definite osseous destruction demonstrated within the right sternoclavicular joint. IMPRESSION: 1. There is a large peripherally enhancing mass  with central fluid and gas within the right lower neck extending from the anterior aspect of the right sternoclavicular location posteriorly along the retrosternal soft tissues. Findings are concerning for abscess. No definite osseous destruction at this time of the right sternoclavicular joint or adjacent costochondral joint however secondary infection of these joints is not excluded given the size of the suspected abscess. 2. Consolidation within the right lower lobe with right upper lobe ground-glass opacities favored to represent multifocal pneumonia. 3. Mediastinal and right hilar adenopathy likely reactive in etiology. Critical Value/emergent results were called by telephone at the time of interpretation on 06/05/2017 at 4:18 pm to Dr. Fayrene Fearing, who verbally acknowledged these results. Electronically Signed   By: Annia Belt M.D.   On: 06/05/2017 16:29   Mr Hip Left W Wo Contrast  Result Date: 06/06/2017 CLINICAL DATA:  Severe left hip pain for the past few weeks. History of IV drug abuse and recurrent skin and soft tissue infections. EXAM: MRI OF THE LEFT HIP WITHOUT AND WITH CONTRAST TECHNIQUE: Multiplanar, multisequence MR imaging was performed both before and after administration of intravenous contrast. CONTRAST:  15mL MULTIHANCE GADOBENATE DIMEGLUMINE 529 MG/ML IV SOLN  COMPARISON:  None. FINDINGS: Bones: There is no evidence of acute fracture, dislocation or avascular necrosis. The visualized bony pelvis appears normal. The visualized sacroiliac joints and symphysis pubis appear normal. Articular cartilage and labrum Articular cartilage: No focal chondral defect or subchondral signal abnormality identified. Labrum: There is a tear of the left anterior superior labrum. Joint or bursal effusion Joint effusion: Small left hip joint effusion with prominent synovial enhancement. No right hip joint effusion. Bursae: Prominent, rim enhancing fluid within the left iliopsoas bursa, extending superiorly into the left iliacus muscle. The bursa measures up to 2.5 x 3.3 cm in maximal AP by TR dimension. No greater trochanteric bursal fluid. Muscles and tendons Muscles and tendons: Prominent muscle edema and enhancement of the left iliacus muscle. Mild myofascial edema along the left vastus and adductor compartments. The visualized gluteus, hamstring and iliopsoas tendons appear normal. The piriformis muscles appear symmetric. Other findings Miscellaneous: Trace presacral edema. The visualized internal pelvic contents otherwise appear unremarkable. IMPRESSION: 1. Findings consistent with left hip septic arthritis and left iliopsoas septic bursitis. The left iliopsoas bursa extends into the left iliacus muscle, which demonstrates prominent muscle edema and enhancement, consistent with infectious myositis. The preliminary findings were called by telephone at the time of interpretation on 06/06/2017 at 4:00 am to Dr. Marcial Pacas OPYD, by Dr. Elgie Collard. Electronically Signed   By: Obie Dredge M.D.   On: 06/06/2017 08:15   Dg Chest Port 1 View  Result Date: 06/07/2017 CLINICAL DATA:  Shortness of breath, post debridement of RIGHT sternoclavicular abscess EXAM: PORTABLE CHEST 1 VIEW COMPARISON:  Portable exam 1639 hours compared to 06/05/2017 FINDINGS: Normal heart size, mediastinal  contours, and pulmonary vascularity. Mild bronchitic changes. Question mild atelectasis versus infiltrate at medial RIGHT lung base. No additional infiltrate, pleural effusion or pneumothorax. No acute osseous findings. IMPRESSION: Mild chronic bronchitic changes with question minimal atelectasis versus infiltrate at medial RIGHT lung base. Electronically Signed   By: Ulyses Southward M.D.   On: 06/07/2017 17:47   Ct Image Guided Drainage By Percutaneous Catheter  Result Date: 06/09/2017 INDICATION: 37 year old male IV drug user with left iliacus abscess. EXAM: CT GUIDED DRAINAGE OF  ABSCESS MEDICATIONS: The patient is currently admitted to the hospital and receiving intravenous antibiotics. The antibiotics were administered within an appropriate time frame prior to the  initiation of the procedure. ANESTHESIA/SEDATION: 4.5 mg IV Versed 200 mcg IV Fentanyl Moderate Sedation Time:  25 minutes The patient was continuously monitored during the procedure by the interventional radiology nurse under my direct supervision. COMPLICATIONS: None immediate. TECHNIQUE: Informed written consent was obtained from the patient after a thorough discussion of the procedural risks, benefits and alternatives. All questions were addressed. Maximal Sterile Barrier Technique was utilized including caps, mask, sterile gowns, sterile gloves, sterile drape, hand hygiene and skin antiseptic. A timeout was performed prior to the initiation of the procedure. PROCEDURE: The operative field was prepped with Chlorhexidine in a sterile fashion, and a sterile drape was applied covering the operative field. A sterile gown and sterile gloves were used for the procedure. Local anesthesia was provided with 1% Lidocaine. A planning axial CT scan was performed. The fluid and gas collection in the left iliac fossa was successfully identified. Local anesthesia was attained by infiltration with 1% lidocaine. A small dermatotomy was made. Under intermittent CT  guidance, an 18 gauge trocar needle was advanced into the fluid collection. A 0.035 Amplatz wire was advanced. The trocar needle was removed. The skin tract was dilated to 12 [place]French. A 12 French all-purpose drainage catheter was advanced over the wire and formed. Aspiration yields approximately 30 mL thick purulent fluid. A sample was sent for culture. Catheter was secured to the skin with 0 Prolene suture. A sterile bandage was placed. Post drain placement imaging demonstrates a well-positioned drainage catheter with near-total resolution of the abscess cavity. FINDINGS: Left iliacus abscess. IMPRESSION: Successful placement of a 12 French drainage catheter into the left iliacus abscess. PLAN: Maintain drain to JP bulb suction.  Flush every shift. When drain output is less than 10 mL per day for 2 consecutive days, the drain may be removed. Electronically Signed   By: Malachy MoanHeath  McCullough M.D.   On: 06/09/2017 18:59    Lab Data:  CBC: Recent Labs  Lab 06/05/17 1444 06/05/17 1457 06/06/17 0315 06/07/17 0210 06/08/17 0541 06/09/17 1237  WBC 23.0*  --  19.7* 18.0* 21.5* 19.4*  NEUTROABS 18.8*  --  15.1*  --   --   --   HGB 13.3 14.6 11.0* 11.2* 11.5* 12.2*  HCT 39.4 43.0 33.1* 33.1* 34.6* 36.4*  MCV 90.6  --  90.2 89.9 89.9 89.4  PLT 318  --  317 334 398 500*   Basic Metabolic Panel: Recent Labs  Lab 06/07/17 0210 06/08/17 0541 06/09/17 1053 06/10/17 0635 06/11/17 0915  NA 134* 136 135 135 136  K 3.6 4.1 3.8 3.4* 4.0  CL 97* 100* 102 102 103  CO2 27 24 22 22 22   GLUCOSE 123* 168* 159* 121* 105*  BUN 6 10 13 10 7   CREATININE 0.64 0.62 0.67 0.62 0.56*  CALCIUM 8.2* 8.6* 8.8* 8.5* 8.8*   GFR: Estimated Creatinine Clearance: 128.2 mL/min (A) (by C-G formula based on SCr of 0.56 mg/dL (L)). Liver Function Tests: Recent Labs  Lab 06/05/17 1444 06/06/17 0315 06/07/17 0210 06/09/17 1053  AST 67* 43* 41 42*  ALT 63 47 39 36  ALKPHOS 201* 176* 185* 165*  BILITOT 0.5 0.3 0.3 0.6    PROT 7.5 6.4* 5.6* 6.6  ALBUMIN 2.9* 2.3* 2.1* 2.6*   No results for input(s): LIPASE, AMYLASE in the last 168 hours. No results for input(s): AMMONIA in the last 168 hours. Coagulation Profile: Recent Labs  Lab 06/07/17 0210  INR 1.11   Cardiac Enzymes: No results for input(s): CKTOTAL, CKMB, CKMBINDEX,  TROPONINI in the last 168 hours. BNP (last 3 results) No results for input(s): PROBNP in the last 8760 hours. HbA1C: No results for input(s): HGBA1C in the last 72 hours. CBG: No results for input(s): GLUCAP in the last 168 hours. Lipid Profile: No results for input(s): CHOL, HDL, LDLCALC, TRIG, CHOLHDL, LDLDIRECT in the last 72 hours. Thyroid Function Tests: No results for input(s): TSH, T4TOTAL, FREET4, T3FREE, THYROIDAB in the last 72 hours. Anemia Panel: No results for input(s): VITAMINB12, FOLATE, FERRITIN, TIBC, IRON, RETICCTPCT in the last 72 hours. Urine analysis:    Component Value Date/Time   COLORURINE YELLOW 06/06/2017 1333   APPEARANCEUR CLEAR 06/06/2017 1333   LABSPEC 1.010 06/06/2017 1333   PHURINE 6.0 06/06/2017 1333   GLUCOSEU NEGATIVE 06/06/2017 1333   HGBUR NEGATIVE 06/06/2017 1333   BILIRUBINUR NEGATIVE 06/06/2017 1333   KETONESUR NEGATIVE 06/06/2017 1333   PROTEINUR NEGATIVE 06/06/2017 1333   UROBILINOGEN 0.2 10/03/2012 0856   NITRITE NEGATIVE 06/06/2017 1333   LEUKOCYTESUR NEGATIVE 06/06/2017 1333     Ripudeep Rai M.D. [facility] 06/11/2017, 10:44 AM  Pager: [phone] Between 7am to 7pm - call Pager - [phone]  After 7pm go to [email or web address] - password [email or web address]  Call night coverage person covering after 7pm

## 2017-06-12 DIAGNOSIS — M00011 Staphylococcal arthritis, right shoulder: Secondary | ICD-10-CM

## 2017-06-12 DIAGNOSIS — Z978 Presence of other specified devices: Secondary | ICD-10-CM

## 2017-06-12 DIAGNOSIS — L0211 Cutaneous abscess of neck: Secondary | ICD-10-CM

## 2017-06-12 DIAGNOSIS — L02416 Cutaneous abscess of left lower limb: Secondary | ICD-10-CM

## 2017-06-12 LAB — BASIC METABOLIC PANEL
Anion gap: 11 (ref 5–15)
BUN: 10 mg/dL (ref 6–20)
CO2: 26 mmol/L (ref 22–32)
Calcium: 9.1 mg/dL (ref 8.9–10.3)
Chloride: 103 mmol/L (ref 101–111)
Creatinine, Ser: 0.53 mg/dL — ABNORMAL LOW (ref 0.61–1.24)
GFR calc Af Amer: >60 mL/min (ref >60)
GFR calc non Af Amer: >60 mL/min (ref >60)
Glucose, Bld: 105 mg/dL — ABNORMAL HIGH (ref 65–99)
Potassium: 4.1 mmol/L (ref 3.5–5.1)
Sodium: 140 mmol/L (ref 135–145)

## 2017-06-12 LAB — AEROBIC/ANAEROBIC CULTURE W GRAM STAIN (SURGICAL/DEEP WOUND)

## 2017-06-12 MED ORDER — OXYCODONE HCL 5 MG PO TABS
10.0000 mg | ORAL_TABLET | ORAL | Status: DC | PRN
  Administered 2017-06-12 – 2017-06-17 (×23): 10 mg via ORAL
  Filled 2017-06-12 (×24): qty 2

## 2017-06-12 NOTE — Progress Notes (Addendum)
[address]       [place] [address]             [phone]      5 Days Post-Op Procedure(s) (LRB): DEBRIDEMENT RIGHT STERNOCLAVICULAR ABSCESS (Right) APPLICATION OF WOUND VAC (Right) Subjective: Feels better, pain is well controlled  Objective: Vital signs in last 24 hours: Temp:  [98.1 F (36.7 C)-98.9 F (37.2 C)] 98.8 F (37.1 C) (01/21 0620) Pulse Rate:  [71-93] 71 (01/21 0620) Resp:  [16-18] 16 (01/21 0620) BP: (115-127)/(64-73) 120/68 (01/21 0620) SpO2:  [97 %-99 %] 99 % (01/21 0620)  Hemodynamic parameters for last 24 hours:    Intake/Output from previous day: 01/20 0701 - 01/21 0700 In: 545 [P.O.:540] Out: 50 [Drains:50] Intake/Output this shift: No intake/output data recorded.  General appearance: alert, cooperative and no distress Heart: regular rate and rhythm Lungs: clear to auscultation bilaterally Wound: vac in place, no erethema or purulence  Lab Results: Recent Labs    06/09/17 1237  WBC 19.4*  HGB 12.2*  HCT 36.4*  PLT 500*   BMET:  Recent Labs    06/10/17 0635 06/11/17 0915  NA 135 136  K 3.4* 4.0  CL 102 103  CO2 22 22  GLUCOSE 121* 105*  BUN 10 7  CREATININE 0.62 0.56*  CALCIUM 8.5* 8.8*    PT/INR: No results for input(s): LABPROT, INR in the last 72 hours. ABG    Component Value Date/Time   PHART 7.430 06/07/2017 0940   HCO3 27.5 06/07/2017 0940   TCO2 29 06/05/2017 1457   ACIDBASEDEF 4.0 (H) 03/13/2007 0120   O2SAT 90.2 06/07/2017 0940   Results for orders placed or performed during the hospital encounter of 06/05/17  Blood culture (routine x 2)     Status: Abnormal   Collection Time: 06/05/17  2:44 PM  Result Value Ref Range Status   Specimen Description RIGHT ANTECUBITAL  Final   Special Requests   Final    BOTTLES DRAWN AEROBIC ONLY Blood Culture adequate volume   Culture  Setup Time   Final    GRAM POSITIVE COCCI Gram Stain Report Called to,Read Back By and Verified With: ROWE,C AT  0730 BY HUFFINES,S ON 06/06/17. AEROBIC BOTTLE ONLY    Culture (A)  Final    STAPHYLOCOCCUS AUREUS SUSCEPTIBILITIES PERFORMED ON PREVIOUS CULTURE WITHIN THE LAST 5 DAYS. Performed at [facility], [address], [place], [place] [address]    Report Status 06/08/2017 FINAL  Final  Blood culture (routine x 2)     Status: Abnormal   Collection Time: 06/05/17  2:47 PM  Result Value Ref Range Status   Specimen Description LEFT ANTECUBITAL  Final   Special Requests   Final    BOTTLES DRAWN AEROBIC AND ANAEROBIC Blood Culture adequate volume   Culture  Setup Time   Final    GRAM POSITIVE COCCI BOTH AEB AND ANA Gram Stain Report Called to,Read Back By and Verified With: J.CRUISE, RN @ [facility] @ 0545 ON 1.15.19 BY BOWMAN,L CRITICAL RESULT CALLED TO, READ BACK BY AND VERIFIED WITH: N. Batchelder Pharm.D. 8:55 06/06/17 ([email or web address]) Performed at [facility], [address] [place], [place] [address]    Culture STAPHYLOCOCCUS AUREUS (A)  Final   Report Status 06/08/2017 FINAL  Final   Organism ID, Bacteria STAPHYLOCOCCUS AUREUS  Final      Susceptibility   Staphylococcus aureus - MIC*    CIPROFLOXACIN <=0.5 SENSITIVE Sensitive  ERYTHROMYCIN <=0.25 SENSITIVE Sensitive     GENTAMICIN <=0.5 SENSITIVE Sensitive     OXACILLIN <=0.25 SENSITIVE Sensitive     TETRACYCLINE <=1 SENSITIVE Sensitive     VANCOMYCIN <=0.5 SENSITIVE Sensitive     TRIMETH/SULFA <=10 SENSITIVE Sensitive     CLINDAMYCIN <=0.25 SENSITIVE Sensitive     RIFAMPIN <=0.5 SENSITIVE Sensitive     Inducible Clindamycin NEGATIVE Sensitive     * STAPHYLOCOCCUS AUREUS  Blood Culture ID Panel (Reflexed)     Status: Abnormal   Collection Time: 06/05/17  2:47 PM  Result Value Ref Range Status   Enterococcus species NOT DETECTED NOT DETECTED Final   Listeria monocytogenes NOT DETECTED NOT DETECTED Final   Staphylococcus species DETECTED (A) NOT DETECTED Final    Comment: CRITICAL RESULT CALLED TO, READ BACK BY AND VERIFIED  WITH: N. Batchelder Pharm.D. 8:55 06/06/17 (wilsonm)    Staphylococcus aureus DETECTED (A) NOT DETECTED Final    Comment: Methicillin (oxacillin) susceptible Staphylococcus aureus (MSSA). Preferred therapy is anti staphylococcal beta lactam antibiotic (Cefazolin or Nafcillin), unless clinically contraindicated. CRITICAL RESULT CALLED TO, READ BACK BY AND VERIFIED WITH: N. Batchelder Pharm.D. 8:55 06/06/17 (wilsonm)    Methicillin resistance NOT DETECTED NOT DETECTED Final   Streptococcus species NOT DETECTED NOT DETECTED Final   Streptococcus agalactiae NOT DETECTED NOT DETECTED Final   Streptococcus pneumoniae NOT DETECTED NOT DETECTED Final   Streptococcus pyogenes NOT DETECTED NOT DETECTED Final   Acinetobacter baumannii NOT DETECTED NOT DETECTED Final   Enterobacteriaceae species NOT DETECTED NOT DETECTED Final   Enterobacter cloacae complex NOT DETECTED NOT DETECTED Final   Escherichia coli NOT DETECTED NOT DETECTED Final   Klebsiella oxytoca NOT DETECTED NOT DETECTED Final   Klebsiella pneumoniae NOT DETECTED NOT DETECTED Final   Proteus species NOT DETECTED NOT DETECTED Final   Serratia marcescens NOT DETECTED NOT DETECTED Final   Haemophilus influenzae NOT DETECTED NOT DETECTED Final   Neisseria meningitidis NOT DETECTED NOT DETECTED Final   Pseudomonas aeruginosa NOT DETECTED NOT DETECTED Final   Candida albicans NOT DETECTED NOT DETECTED Final   Candida glabrata NOT DETECTED NOT DETECTED Final   Candida krusei NOT DETECTED NOT DETECTED Final   Candida parapsilosis NOT DETECTED NOT DETECTED Final   Candida tropicalis NOT DETECTED NOT DETECTED Final  Culture, blood (routine x 2)     Status: None   Collection Time: 06/06/17  1:45 PM  Result Value Ref Range Status   Specimen Description BLOOD RIGHT ANTECUBITAL  Final   Special Requests   Final    BOTTLES DRAWN AEROBIC AND ANAEROBIC Blood Culture adequate volume   Culture NO GROWTH 5 DAYS  Final   Report Status 06/11/2017 FINAL   Final  Culture, blood (routine x 2)     Status: None   Collection Time: 06/06/17  1:54 PM  Result Value Ref Range Status   Specimen Description BLOOD LEFT ANTECUBITAL  Final   Special Requests   Final    BOTTLES DRAWN AEROBIC AND ANAEROBIC Blood Culture adequate volume   Culture NO GROWTH 5 DAYS  Final   Report Status 06/11/2017 FINAL  Final  Aerobic/Anaerobic Culture (surgical/deep wound)     Status: None   Collection Time: 06/06/17  4:09 PM  Result Value Ref Range Status   Specimen Description WOUND LEFT HIP  Final   Special Requests PT ON VANC ANCEF ROCEPHIN ZOSYN  Final   Gram Stain   Final    NO WBC SEEN NO SQUAMOUS EPITHELIAL CELLS SEEN NO ORGANISMS SEEN  Culture   Final    RARE STAPHYLOCOCCUS AUREUS NO ANAEROBES ISOLATED    Report Status 06/11/2017 FINAL  Final   Organism ID, Bacteria STAPHYLOCOCCUS AUREUS  Final      Susceptibility   Staphylococcus aureus - MIC*    CIPROFLOXACIN <=0.5 SENSITIVE Sensitive     ERYTHROMYCIN <=0.25 SENSITIVE Sensitive     GENTAMICIN <=0.5 SENSITIVE Sensitive     OXACILLIN 0.5 SENSITIVE Sensitive     TETRACYCLINE <=1 SENSITIVE Sensitive     VANCOMYCIN <=0.5 SENSITIVE Sensitive     TRIMETH/SULFA <=10 SENSITIVE Sensitive     CLINDAMYCIN <=0.25 SENSITIVE Sensitive     RIFAMPIN <=0.5 SENSITIVE Sensitive     Inducible Clindamycin NEGATIVE Sensitive     * RARE STAPHYLOCOCCUS AUREUS  Surgical pcr screen     Status: Abnormal   Collection Time: 06/06/17  8:38 PM  Result Value Ref Range Status   MRSA, PCR NEGATIVE NEGATIVE Final   Staphylococcus aureus POSITIVE (A) NEGATIVE Final    Comment: (NOTE) The Xpert SA Assay (FDA approved for NASAL specimens in patients 45 years of age and older), is one component of a comprehensive surveillance program. It is not intended to diagnose infection nor to guide or monitor treatment.   Culture, sputum-assessment     Status: None   Collection Time: 06/07/17  9:14 AM  Result Value Ref Range Status    Specimen Description SPUTUM  Final   Special Requests NONE  Final   Sputum evaluation THIS SPECIMEN IS ACCEPTABLE FOR SPUTUM CULTURE  Final   Report Status 06/07/2017 FINAL  Final  Culture, respiratory (NON-Expectorated)     Status: None   Collection Time: 06/07/17  9:14 AM  Result Value Ref Range Status   Specimen Description SPUTUM  Final   Special Requests NONE Reflexed from [id number]  Final   Gram Stain   Final    FEW WBC PRESENT, PREDOMINANTLY PMN NO ORGANISMS SEEN    Culture MODERATE CANDIDA TROPICALIS  Final   Report Status 06/09/2017 FINAL  Final  Aerobic/Anaerobic Culture (surgical/deep wound)     Status: None (Preliminary result)   Collection Time: 06/07/17  3:32 PM  Result Value Ref Range Status   Specimen Description WOUND  Final   Special Requests CHEST WALL ABSCESS  Final   Gram Stain   Final    ABUNDANT WBC PRESENT, PREDOMINANTLY PMN MODERATE GRAM POSITIVE COCCI    Culture   Final    MODERATE STAPHYLOCOCCUS AUREUS NO ANAEROBES ISOLATED; CULTURE IN PROGRESS FOR 5 DAYS    Report Status PENDING  Incomplete   Organism ID, Bacteria STAPHYLOCOCCUS AUREUS  Final      Susceptibility   Staphylococcus aureus - MIC*    CIPROFLOXACIN <=0.5 SENSITIVE Sensitive     ERYTHROMYCIN <=0.25 SENSITIVE Sensitive     GENTAMICIN <=0.5 SENSITIVE Sensitive     OXACILLIN 0.5 SENSITIVE Sensitive     TETRACYCLINE <=1 SENSITIVE Sensitive     VANCOMYCIN 1 SENSITIVE Sensitive     TRIMETH/SULFA <=10 SENSITIVE Sensitive     CLINDAMYCIN <=0.25 SENSITIVE Sensitive     RIFAMPIN <=0.5 SENSITIVE Sensitive     Inducible Clindamycin NEGATIVE Sensitive     * MODERATE STAPHYLOCOCCUS AUREUS  Aerobic/Anaerobic Culture (surgical/deep wound)     Status: None (Preliminary result)   Collection Time: 06/09/17  6:24 PM  Result Value Ref Range Status   Specimen Description ABSCESS LEFT ILIACUS  Final   Special Requests NONE  Final   Gram Stain   Final  ABUNDANT WBC PRESENT, PREDOMINANTLY PMN NO ORGANISMS  SEEN    Culture   Final    FEW STAPHYLOCOCCUS AUREUS SUSCEPTIBILITIES PERFORMED ON PREVIOUS CULTURE WITHIN THE LAST 5 DAYS. NO ANAEROBES ISOLATED; CULTURE IN PROGRESS FOR 5 DAYS    Report Status PENDING  Incomplete     CBG (last 3)  No results for input(s): GLUCAP in the last 72 hours.  Meds Scheduled Meds: . acetaminophen  1,000 mg Oral Q6H   Or  . acetaminophen (TYLENOL) oral liquid 160 mg/5 mL  1,000 mg Oral Q6H  . enoxaparin (LOVENOX) injection  40 mg Subcutaneous Q24H  . iopamidol  20 mL Intra-articular Once  . lidocaine (PF)  5 mL Intradermal Once  . nicotine  21 mg Transdermal Daily  . pantoprazole  40 mg Oral Q0600  . sodium chloride  10 mL Intravenous Once  . sodium chloride flush  5 mL Intravenous Q8H   Continuous Infusions: . sodium chloride    .  ceFAZolin (ANCEF) IV Stopped (06/12/17 0255)  . dextrose 5 % and 0.45% NaCl 30 mL/hr at 06/07/17 2000  . lactated ringers 10 mL/hr at 06/06/17 1406  . lactated ringers 10 mL/hr at 06/07/17 1354  . potassium chloride     PRN Meds:.acetaminophen **OR** acetaminophen, alum & mag hydroxide-simeth, diphenhydrAMINE, fentaNYL (SUBLIMAZE) injection, HYDROmorphone (DILAUDID) injection, metoCLOPramide **OR** metoCLOPramide (REGLAN) injection, ondansetron **OR** ondansetron (ZOFRAN) IV, oxyCODONE, potassium chloride, senna-docusate, zolpidem  Xrays No results found.  Assessment/Plan: S/P Procedure(s) (LRB): DEBRIDEMENT RIGHT STERNOCLAVICULAR ABSCESS (Right) APPLICATION OF WOUND VAC (Right)   1 plan for wound vac change today  2 conts current abx- ID managing 3 med management per primary  LOS: 7 days    Rowe Clack 06/12/2017  patient examined and medical record reviewed,agree with above note. Kathlee Nations Trigt III 06/12/2017

## 2017-06-12 NOTE — Progress Notes (Signed)
Referring Physician(s): Dr Rod Can Rai  Supervising Physician: Ruel FavorsShick, Trevor  Patient Status:  [facility]  Chief Complaint:  Iliacus abscess Drain placed 06/09/17  Subjective:  Up in bed; eating Reg diet Says he feels lots better than few days ago OP is blood tinged; not new OP = flushes per pt  Allergies: Patient has no known allergies.  Medications: Prior to Admission medications   Medication Sig Start Date End Date Taking? Authorizing Provider  Aspirin-Salicylamide-Caffeine (BC HEADACHE POWDER PO) Take 2-6 packets by mouth daily as needed (pain).   Yes [provider]  HYDROcodone-acetaminophen (NORCO/VICODIN) 5-325 MG per tablet 1 or 2 tabs PO q6 hours prn pain Patient taking differently: Take 1-2 tablets by mouth every 6 (six) hours as needed for moderate pain.  10/03/12  Yes Samuel JesterMcManus, Kathleen, DO  ibuprofen (ADVIL,MOTRIN) 200 MG tablet Take 200-400 mg by mouth every 6 (six) hours as needed for mild pain.   Yes [provider]     Vital Signs: BP 119/74 (BP Location: Left Arm)   Pulse 80   Temp 98.5 F (36.9 C) (Oral)   Resp 16   Ht 5\' 10"  (1.778 m)   Wt 154 lb 15.7 oz (70.3 kg)   SpO2 100%   BMI 22.24 kg/m   Physical Exam  Constitutional: He is oriented to person, place, and time.  Abdominal: Soft. Bowel sounds are normal.  Musculoskeletal: Normal range of motion.  Neurological: He is alert and oriented to person, place, and time.  Skin: Skin is warm and dry.  Site of drain is clean and dry NT no bleeding OP blood tinged 10 cc in JP 25 cc per day for IR drain  Psychiatric: He has a normal mood and affect. His behavior is normal.  Nursing note and vitals reviewed.   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 EXAM: CT GUIDED DRAINAGE OF  ABSCESS MEDICATIONS: The patient is currently admitted to the hospital and receiving  intravenous antibiotics. The antibiotics were administered within an appropriate time frame prior to the initiation of the procedure. ANESTHESIA/SEDATION: 4.5 mg IV Versed 200 mcg IV Fentanyl Moderate Sedation Time:  25 minutes The patient was continuously monitored during the procedure by the interventional radiology nurse under my direct supervision. COMPLICATIONS: None immediate. TECHNIQUE: Informed written consent was obtained from the patient after a thorough discussion of the procedural risks, benefits and alternatives. All questions were addressed. Maximal Sterile Barrier Technique was utilized including caps, mask, sterile gowns, sterile gloves, sterile drape, hand hygiene and skin antiseptic. A timeout was performed prior to the initiation of the procedure. PROCEDURE: The operative field was prepped with Chlorhexidine in a sterile fashion, and a sterile drape was applied covering the operative field. A sterile gown and sterile gloves were used for the procedure. Local anesthesia was provided with 1% Lidocaine. A planning axial CT scan was performed. The fluid and gas collection in the left iliac fossa was successfully identified. Local anesthesia was attained by infiltration with 1% lidocaine. A small dermatotomy was made. Under intermittent CT guidance, an 18 gauge trocar needle was advanced into the fluid collection. A 0.035 Amplatz wire was advanced. The trocar needle was removed. The skin tract was dilated to 12 [place]French. A 12 French all-purpose drainage catheter was advanced over the wire and formed. Aspiration yields approximately 30 mL thick purulent fluid. A sample was sent for culture. Catheter was secured  to the skin with 0 Prolene suture. A sterile bandage was placed. Post drain placement imaging demonstrates a well-positioned drainage catheter with near-total resolution of the abscess cavity. FINDINGS: Left iliacus abscess. IMPRESSION: Successful placement of a 12 French drainage catheter into the  left iliacus abscess. PLAN: Maintain drain to JP bulb suction.  Flush every shift. When drain output is less than 10 mL per day for 2 consecutive days, the drain may be removed. Electronically Signed   By: Malachy Moan M.D.   On: 06/09/2017 18:59    Labs:  CBC: Recent Labs    06/06/17 0315 06/07/17 0210 06/08/17 0541 06/09/17 1237  WBC 19.7* 18.0* 21.5* 19.4*  HGB 11.0* 11.2* 11.5* 12.2*  HCT 33.1* 33.1* 34.6* 36.4*  PLT 317 334 398 500*    COAGS: Recent Labs    06/07/17 0210  INR 1.11  APTT 39*    BMP: Recent Labs    06/09/17 1053 06/10/17 0635 06/11/17 0915 06/12/17 0718  NA 135 135 136 140  K 3.8 3.4* 4.0 4.1  CL 102 102 103 103  CO2 22 22 22 26   GLUCOSE 159* 121* 105* 105*  BUN 13 10 7 10   CALCIUM 8.8* 8.5* 8.8* 9.1  CREATININE 0.67 0.62 0.56* 0.53*  GFRNONAA >60 >60 >60 >60  GFRAA >60 >60 >60 >60    LIVER FUNCTION TESTS: Recent Labs    06/05/17 1444 06/06/17 0315 06/07/17 0210 06/09/17 1053  BILITOT 0.5 0.3 0.3 0.6  AST 67* 43* 41 42*  ALT 63 47 39 36  ALKPHOS 201* 176* 185* 165*  PROT 7.5 6.4* 5.6* 6.6  ALBUMIN 2.9* 2.3* 2.1* 2.6*    Assessment and Plan:  Iliacus abscess drain 06/09/17 Draining well Still with 25 cc per day Flushing maybe 15 cc daily Will follow Will need CT when OP is less than 10 cc per day IR can follow as OP if pt dc'd with drain---   Electronically Signed: Analeya Luallen A, PA-C 06/12/2017, 3:07 PM   I spent a total of 15 Minutes at the the patient's bedside AND on the patient's hospital floor or unit, greater than 50% of which was counseling/coordinating care for iliacus abscess drain

## 2017-06-12 NOTE — Progress Notes (Signed)
[facility]  Date of Admission:  06/05/2017     Reason for Visit: Follow up on MSSA bacteremia, septic arthritis of sternoclavicular joint, neck abscesses, left iliopsoas abscess.   Total days of antibiotics 8  Cefazolin day 7          Patient ID: Clifford Tucker is a 37 y.o. 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 Pain is under better control and getting a little more sleep. No complaints aside from some "tightness" to his chest from wound vac suction. Due for a change today. He has little drainage out from IR tube and is complaining of itching to his suture sites. Is concerned that the drain may have "pulled out" a little during sleep.   S/P IR drainage of left iliacus abscess 06/09/17 S/P debridement of chest 06/07/17 S/P debridement of left hip 06/06/17  . acetaminophen  1,000 mg Oral Q6H   Or  . acetaminophen (TYLENOL) oral liquid 160 mg/5 mL  1,000 mg Oral Q6H  . enoxaparin (LOVENOX) injection  40 mg Subcutaneous Q24H  . iopamidol  20 mL Intra-articular Once  . lidocaine (PF)  5 mL Intradermal Once  . nicotine  21 mg Transdermal Daily  . pantoprazole  40 mg Oral Q0600  . sodium chloride  10 mL Intravenous Once  . sodium chloride flush  5 mL Intravenous Q8H   No Known Allergies  OBJECTIVE: Vitals:   06/11/17 0403 06/11/17 1412 06/11/17 2208 06/12/17 0620  BP: 128/76 115/73 127/64 120/68  Pulse: 68 93 81 71  Resp: 16  18 16   Temp: 98.3 F (36.8 C) 98.9 F (37.2 C) 98.1 F (36.7 C) 98.8 F (37.1 C)  TempSrc: Oral Oral Oral Oral  SpO2: 95% 97% 99%  99%  Weight:      Height:       Body mass index is 22.24 kg/m.  Physical Exam  Constitutional: He is oriented to person, place, and time and well-developed, well-nourished, and in no distress.  HENT:  Mouth/Throat: No oral lesions. Normal dentition. No dental caries.  Eyes: No scleral icterus.  Cardiovascular: Normal rate, regular rhythm and normal heart sounds.  Pulmonary/Chest: Effort normal and breath sounds normal.    Abdominal: Soft. He exhibits no distension. There is no tenderness.  Musculoskeletal:       Legs: Lymphadenopathy:    He has no cervical adenopathy.  Neurological: He is alert and oriented to person, place, and time.  Skin: Skin is warm and dry. No rash noted.  Psychiatric: Mood and affect normal.  Vitals reviewed.  Lab Results Lab Results  Component Value Date   WBC 19.4 (H) 06/09/2017   HGB 12.2 (L) 06/09/2017   HCT 36.4 (L) 06/09/2017   MCV 89.4 06/09/2017   PLT 500 (H) 06/09/2017    Lab Results  Component Value Date   CREATININE 0.53 (L) 06/12/2017   BUN 10 06/12/2017   NA 140 06/12/2017   K 4.1 06/12/2017   CL 103 06/12/2017   CO2 26 06/12/2017    Lab  Results  Component Value Date   ALT 36 06/09/2017   AST 42 (H) 06/09/2017   ALKPHOS 165 (H) 06/09/2017   BILITOT 0.6 06/09/2017     Microbiology: Recent Results (from the past 240 hour(s))  Blood culture (routine x 2)     Status: Abnormal   Collection Time: 06/05/17  2:44 PM  Result Value Ref Range Status   Specimen Description RIGHT ANTECUBITAL  Final   Special Requests   Final    BOTTLES DRAWN AEROBIC ONLY Blood Culture adequate volume   Culture  Setup Time   Final    GRAM POSITIVE COCCI Gram Stain Report Called to,Read Back By and Verified With: ROWE,C AT 0730 BY HUFFINES,S ON 06/06/17. AEROBIC BOTTLE ONLY    Culture (A)  Final    STAPHYLOCOCCUS AUREUS SUSCEPTIBILITIES PERFORMED ON PREVIOUS CULTURE WITHIN THE LAST 5 DAYS. Performed at [facility], [address]  [place], [place] [address]    Report Status 06/08/2017 FINAL  Final  Blood culture (routine x 2)     Status: Abnormal   Collection Time: 06/05/17  2:47 PM  Result Value Ref Range Status   Specimen Description LEFT ANTECUBITAL  Final   Special Requests   Final    BOTTLES DRAWN AEROBIC AND ANAEROBIC Blood Culture adequate volume   Culture  Setup Time   Final    GRAM POSITIVE COCCI BOTH AEB AND ANA Gram Stain Report Called to,Read Back By and Verified With: J.CRUISE, RN @ [facility] ED @ 0545 ON 1.15.19 BY BOWMAN,L CRITICAL RESULT CALLED TO, READ BACK BY AND VERIFIED WITH: N. Batchelder Pharm.D. 8:55 06/06/17 ([email or web address]) Performed at [facility], [address] [place], [place] [address]    Culture STAPHYLOCOCCUS AUREUS (A)  Final   Report Status 06/08/2017 FINAL  Final   Organism ID, Bacteria STAPHYLOCOCCUS AUREUS  Final      Susceptibility   Staphylococcus aureus - MIC*    CIPROFLOXACIN <=0.5 SENSITIVE Sensitive     ERYTHROMYCIN <=0.25 SENSITIVE Sensitive     GENTAMICIN <=0.5 SENSITIVE Sensitive     OXACILLIN <=0.25 SENSITIVE Sensitive     TETRACYCLINE <=1 SENSITIVE Sensitive     VANCOMYCIN <=0.5 SENSITIVE Sensitive     TRIMETH/SULFA <=10 SENSITIVE Sensitive     CLINDAMYCIN <=0.25 SENSITIVE Sensitive     RIFAMPIN <=0.5 SENSITIVE Sensitive     Inducible Clindamycin NEGATIVE Sensitive     * STAPHYLOCOCCUS AUREUS  Blood Culture ID Panel (Reflexed)     Status: Abnormal   Collection Time: 06/05/17  2:47 PM  Result Value Ref Range Status   Enterococcus species NOT DETECTED NOT DETECTED Final   Listeria monocytogenes NOT DETECTED NOT DETECTED Final   Staphylococcus species DETECTED (A) NOT DETECTED Final    Comment: CRITICAL RESULT CALLED TO, READ BACK BY AND VERIFIED WITH: N. Batchelder Pharm.D. 8:55 06/06/17 ([email or web address])    Staphylococcus aureus DETECTED (A) NOT DETECTED Final    Comment: Methicillin (oxacillin) susceptible Staphylococcus aureus (MSSA). Preferred therapy is anti staphylococcal  beta lactam antibiotic (Cefazolin or Nafcillin), unless clinically contraindicated. CRITICAL RESULT CALLED TO, READ BACK BY AND VERIFIED WITH: N. Batchelder Pharm.D. 8:55 06/06/17 (wilsonm)    Methicillin resistance NOT DETECTED NOT DETECTED Final   Streptococcus species NOT DETECTED NOT DETECTED Final   Streptococcus agalactiae NOT DETECTED NOT DETECTED Final   Streptococcus pneumoniae NOT DETECTED NOT DETECTED Final   Streptococcus pyogenes NOT DETECTED NOT DETECTED Final   Acinetobacter baumannii NOT DETECTED NOT DETECTED Final   Enterobacteriaceae species  NOT DETECTED NOT DETECTED Final   Enterobacter cloacae complex NOT DETECTED NOT DETECTED Final   Escherichia coli NOT DETECTED NOT DETECTED Final   Klebsiella oxytoca NOT DETECTED NOT DETECTED Final   Klebsiella pneumoniae NOT DETECTED NOT DETECTED Final   Proteus species NOT DETECTED NOT DETECTED Final   Serratia marcescens NOT DETECTED NOT DETECTED Final   Haemophilus influenzae NOT DETECTED NOT DETECTED Final   Neisseria meningitidis NOT DETECTED NOT DETECTED Final   Pseudomonas aeruginosa NOT DETECTED NOT DETECTED Final   Candida albicans NOT DETECTED NOT DETECTED Final   Candida glabrata NOT DETECTED NOT DETECTED Final   Candida krusei NOT DETECTED NOT DETECTED Final   Candida parapsilosis NOT DETECTED NOT DETECTED Final   Candida tropicalis NOT DETECTED NOT DETECTED Final  Culture, blood (routine x 2)     Status: None   Collection Time: 06/06/17  1:45 PM  Result Value Ref Range Status   Specimen Description BLOOD RIGHT ANTECUBITAL  Final   Special Requests   Final    BOTTLES DRAWN AEROBIC AND ANAEROBIC Blood Culture adequate volume   Culture NO GROWTH 5 DAYS  Final   Report Status 06/11/2017 FINAL  Final  Culture, blood (routine x 2)     Status: None   Collection Time: 06/06/17  1:54 PM  Result Value Ref Range Status   Specimen Description BLOOD LEFT ANTECUBITAL  Final   Special Requests   Final    BOTTLES DRAWN  AEROBIC AND ANAEROBIC Blood Culture adequate volume   Culture NO GROWTH 5 DAYS  Final   Report Status 06/11/2017 FINAL  Final  Aerobic/Anaerobic Culture (surgical/deep wound)     Status: None   Collection Time: 06/06/17  4:09 PM  Result Value Ref Range Status   Specimen Description WOUND LEFT HIP  Final   Special Requests PT ON VANC ANCEF ROCEPHIN ZOSYN  Final   Gram Stain   Final    NO WBC SEEN NO SQUAMOUS EPITHELIAL CELLS SEEN NO ORGANISMS SEEN    Culture   Final    RARE STAPHYLOCOCCUS AUREUS NO ANAEROBES ISOLATED    Report Status 06/11/2017 FINAL  Final   Organism ID, Bacteria STAPHYLOCOCCUS AUREUS  Final      Susceptibility   Staphylococcus aureus - MIC*    CIPROFLOXACIN <=0.5 SENSITIVE Sensitive     ERYTHROMYCIN <=0.25 SENSITIVE Sensitive     GENTAMICIN <=0.5 SENSITIVE Sensitive     OXACILLIN 0.5 SENSITIVE Sensitive     TETRACYCLINE <=1 SENSITIVE Sensitive     VANCOMYCIN <=0.5 SENSITIVE Sensitive     TRIMETH/SULFA <=10 SENSITIVE Sensitive     CLINDAMYCIN <=0.25 SENSITIVE Sensitive     RIFAMPIN <=0.5 SENSITIVE Sensitive     Inducible Clindamycin NEGATIVE Sensitive     * RARE STAPHYLOCOCCUS AUREUS  Surgical pcr screen     Status: Abnormal   Collection Time: 06/06/17  8:38 PM  Result Value Ref Range Status   MRSA, PCR NEGATIVE NEGATIVE Final   Staphylococcus aureus POSITIVE (A) NEGATIVE Final    Comment: (NOTE) The Xpert SA Assay (FDA approved for NASAL specimens in patients 46 years of age and older), is one component of a comprehensive surveillance program. It is not intended to diagnose infection nor to guide or monitor treatment.   Culture, sputum-assessment     Status: None   Collection Time: 06/07/17  9:14 AM  Result Value Ref Range Status   Specimen Description SPUTUM  Final   Special Requests NONE  Final   Sputum evaluation THIS  SPECIMEN IS ACCEPTABLE FOR SPUTUM CULTURE  Final   Report Status 06/07/2017 FINAL  Final  Culture, respiratory (NON-Expectorated)      Status: None   Collection Time: 06/07/17  9:14 AM  Result Value Ref Range Status   Specimen Description SPUTUM  Final   Special Requests NONE Reflexed from [id number]  Final   Gram Stain   Final    FEW WBC PRESENT, PREDOMINANTLY PMN NO ORGANISMS SEEN    Culture MODERATE CANDIDA TROPICALIS  Final   Report Status 06/09/2017 FINAL  Final  Aerobic/Anaerobic Culture (surgical/deep wound)     Status: None (Preliminary result)   Collection Time: 06/07/17  3:32 PM  Result Value Ref Range Status   Specimen Description WOUND  Final   Special Requests CHEST WALL ABSCESS  Final   Gram Stain   Final    ABUNDANT WBC PRESENT, PREDOMINANTLY PMN MODERATE GRAM POSITIVE COCCI    Culture   Final    MODERATE STAPHYLOCOCCUS AUREUS NO ANAEROBES ISOLATED; CULTURE IN PROGRESS FOR 5 DAYS    Report Status PENDING  Incomplete   Organism ID, Bacteria STAPHYLOCOCCUS AUREUS  Final      Susceptibility   Staphylococcus aureus - MIC*    CIPROFLOXACIN <=0.5 SENSITIVE Sensitive     ERYTHROMYCIN <=0.25 SENSITIVE Sensitive     GENTAMICIN <=0.5 SENSITIVE Sensitive     OXACILLIN 0.5 SENSITIVE Sensitive     TETRACYCLINE <=1 SENSITIVE Sensitive     VANCOMYCIN 1 SENSITIVE Sensitive     TRIMETH/SULFA <=10 SENSITIVE Sensitive     CLINDAMYCIN <=0.25 SENSITIVE Sensitive     RIFAMPIN <=0.5 SENSITIVE Sensitive     Inducible Clindamycin NEGATIVE Sensitive     * MODERATE STAPHYLOCOCCUS AUREUS  Aerobic/Anaerobic Culture (surgical/deep wound)     Status: None (Preliminary result)   Collection Time: 06/09/17  6:24 PM  Result Value Ref Range Status   Specimen Description ABSCESS LEFT ILIACUS  Final   Special Requests NONE  Final   Gram Stain   Final    ABUNDANT WBC PRESENT, PREDOMINANTLY PMN NO ORGANISMS SEEN    Culture   Final    FEW STAPHYLOCOCCUS AUREUS SUSCEPTIBILITIES PERFORMED ON PREVIOUS CULTURE WITHIN THE LAST 5 DAYS. NO ANAEROBES ISOLATED; CULTURE IN PROGRESS FOR 5 DAYS    Report Status PENDING  Incomplete    ASSESSMENT: MSSA bacteremia - TTE without vegetation  Sternoclavicular joint septic arthritis - s/p debridement 06/07/17 Septic arthritis of left hip - s/p debridement 06/06/17 Left Iliopsoas abscess - 06/09/17 perc drain  IV[facility]  PLAN: 1. Continue Ancef for 6 weeks through March 1st.  2. Once SNF details are complete please place single lumen PICC line. I discussed with he and his family today that he is too high risk to be sent home with PICC line and we are concerned for his safety with recent relapse.  3. I anticipate that he will be ready to be discharged from surgery stand point soon. He and his family have many questions regarding when and where he will need to go to complete therapy. LCSW has seen him but no firm plans for D/C yet.  4. He is interested in suboxone therapy outpatient again as it worked well for him however it became too expensive for him.  5. Sutures remain intact with drain. Output clear and appears to be what it has been flushed with. Plan for removal per IR team.  6. He will need follow up in ID clinic outpatient in a few weeks to follow closely.  Rexene Alberts, MSN, NP-C [facility] Cell: [phone] Pager: [phone]  06/12/2017  12:18 PM

## 2017-06-12 NOTE — Progress Notes (Signed)
[facility]                                                                              Patient Demographics  Clifford Tucker, is a 37 y.o. male, DOB - 02-04-1981, ZOX:[id number]  Admit date - 06/05/2017   Admitting Physician Briscoe Deutscherimothy S Opyd, MD  Outpatient Primary MD for the patient is Patient, No Pcp Per  Outpatient specialists:   LOS - 7  days   Medical records reviewed and are as summarized below:    Chief Complaint  Patient presents with  . Oral Swelling       Brief summary   Clifford Tucker is a 37 y.o. malewith medical history significant forIV drug abuse and recurrent skin and soft tissue infections. He presented with neck/hip pain found to have a neck abscess, septic arthritis of the hip and now MSSA bacteremia. CT surgery, orthopedic surgery and infectious disease consulted.   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  Reported having history of multiple abscesses over the last few weeks -Patient was originally placed on vancomycin and Zosyn, transition to IV Ancef -Sternal aspirate showed MSSA, left hip wound cultures also MSSA -2D echo with no vegetation.  ID following, recommended continue cefazolin - feels better today, reviewed ID recommendations. Cont IV ancef for 6 weeks through March 1st. [facility]   Active Problems:   Multifocal pneumonia -ID following, continue cefazolin  Left hip abscess/iliopsoas septic arthritis, infectious myositis -Status post left hip I&D with cultures done on 1/15, showing staph aureus -Continue Ancef for 6 weeks  -Status post drain placement 1/18 for iliacus abscess -Doppler ultrasound of the lower extremity, left negative for any DVT  Septic arthritis of the sternoclavicular  joint, abscess -CTVS following, underwent debridement with wound VAC of the neck abscess    IV drug user -Per patient, has been using heroin, [facility] but relapsed a month ago  -Last heroin use a week ago, so far not in any withdrawals, monitor closely -Social work consulted for rehab/SNF placement   Nicotine abuse -Nicotine patch placed   Code Status: Full code DVT Prophylaxis: Lovenox Family Communication: Discussed in detail with the patient, all imaging results, lab results explained to the patient and girlfriend in the room   Disposition Plan:   Time Spent in minutes   15 minutes  Procedures:  I&D left hip 1/15  Consultants:   Orthopedics CT VS ID  Antimicrobials:      Medications  Scheduled Meds: . acetaminophen  1,000 mg Oral Q6H   Or  . acetaminophen (TYLENOL) oral liquid 160 mg/5 mL  1,000 mg Oral Q6H  . enoxaparin (LOVENOX) injection  40 mg Subcutaneous Q24H  . iopamidol  20 mL Intra-articular Once  . lidocaine (PF)  5 mL Intradermal Once  . nicotine  21 mg Transdermal Daily  . pantoprazole  40 mg Oral Q0600  .  sodium chloride  10 mL Intravenous Once  . sodium chloride flush  5 mL Intravenous Q8H   Continuous Infusions: . sodium chloride    .  ceFAZolin (ANCEF) IV Stopped (06/12/17 0940)  . dextrose 5 % and 0.45% NaCl 30 mL/hr at 06/07/17 2000  . lactated ringers 10 mL/hr at 06/06/17 1406  . lactated ringers 10 mL/hr at 06/07/17 1354  . potassium chloride     PRN Meds:.acetaminophen **OR** acetaminophen, alum & mag hydroxide-simeth, diphenhydrAMINE, fentaNYL (SUBLIMAZE) injection, HYDROmorphone (DILAUDID) injection, metoCLOPramide **OR** metoCLOPramide (REGLAN) injection, ondansetron **OR** ondansetron (ZOFRAN) IV, oxyCODONE, potassium chloride, senna-docusate, zolpidem   Antibiotics   Anti-infectives (From admission, onward)   Start     Dose/Rate Route Frequency Ordered Stop   06/07/17 1400  cefUROXime (ZINACEF) 1.5 g  in dextrose 5 % 50 mL IVPB     1.5 g 100 mL/hr over 30 Minutes Intravenous To [facility] 06/07/17 0125 06/07/17 1527   06/07/17 0745  vancomycin (VANCOCIN) 1,000 mg in sodium chloride 0.9 % 1,000 mL irrigation      Irrigation To Surgery 06/07/17 0744 06/07/17 1513   06/06/17 1639  gentamicin (GARAMYCIN) injection  Status:  Discontinued       As needed 06/06/17 1640 06/06/17 1715   06/06/17 1638  vancomycin (VANCOCIN) powder  Status:  Discontinued       As needed 06/06/17 1639 06/06/17 1715   06/06/17 1000  ceFAZolin (ANCEF) IVPB 2g/100 mL premix     2 g 200 mL/hr over 30 Minutes Intravenous Every 8 hours 06/06/17 0911     06/06/17 0000  vancomycin (VANCOCIN) IVPB 1000 mg/200 mL premix  Status:  Discontinued     1,000 mg 200 mL/hr over 60 Minutes Intravenous Every 8 hours 06/05/17 2211 06/06/17 0911   06/05/17 2330  piperacillin-tazobactam (ZOSYN) IVPB 3.375 g  Status:  Discontinued     3.375 g 12.5 mL/hr over 240 Minutes Intravenous Every 8 hours 06/05/17 2211 06/06/17 0911   06/05/17 2200  cefTRIAXone (ROCEPHIN) 1 g in dextrose 5 % 50 mL IVPB  Status:  Discontinued     1 g 100 mL/hr over 30 Minutes Intravenous Every 24 hours 06/05/17 2140 06/05/17 2202   06/05/17 2200  azithromycin (ZITHROMAX) 500 mg in dextrose 5 % 250 mL IVPB  Status:  Discontinued     500 mg 250 mL/hr over 60 Minutes Intravenous Every 24 hours 06/05/17 2140 06/06/17 0911   06/05/17 1600  vancomycin (VANCOCIN) 1,500 mg in sodium chloride 0.9 % 500 mL IVPB     1,500 mg 250 mL/hr over 120 Minutes Intravenous  Once 06/05/17 1542 06/05/17 1855   06/05/17 1515  piperacillin-tazobactam (ZOSYN) IVPB 3.375 g     3.375 g 100 mL/hr over 30 Minutes Intravenous  Once 06/05/17 1512 06/05/17 1557        Subjective:   Faheem Ziemann was seen and examined today.Feels better today, slept well last night. Patient denies dizziness, chest pain, shortness of breath, abdominal pain, N/V/D/C, new weakness, numbess,  tingling.    Objective:   Vitals:   06/11/17 0403 06/11/17 1412 06/11/17 2208 06/12/17 0620  BP: 128/76 115/73 127/64 120/68  Pulse: 68 93 81 71  Resp: 16  18 16   Temp: 98.3 F (36.8 C) 98.9 F (37.2 C) 98.1 F (36.7 C) 98.8 F (37.1 C)  TempSrc: Oral Oral Oral Oral  SpO2: 95% 97% 99% 99%  Weight:      Height:        Intake/Output Summary (Last 24  hours) at 06/12/2017 1304 Last data filed at 06/12/2017 1123 Gross per 24 hour  Intake 505 ml  Output 50 ml  Net 455 ml     Wt Readings from Last 3 Encounters:  06/06/17 70.3 kg (154 lb 15.7 oz)  10/03/12 72.6 kg (160 lb)     Exam  Physical Exam  General: Alert and oriented x 3, NAD  Eyes:   HEENT:  wound vac +  Cardiovascular: S1 S2 auscultated, no rubs, murmurs or gallops. Regular rate and rhythm. No pedal edema b/l  Respiratory: Clear to auscultation bilaterally, no wheezing, rales or rhonchi  Gastrointestinal: Soft, nontender, nondistended, + bowel sounds  Ext: no pedal edema bilaterally  Neuro: no new deficits  Musculoskeletal: No digital cyanosis, clubbing  Skin: No rashes  Psych: Normal affect and demeanor, alert and oriented x3    Data Reviewed:  I have personally reviewed following labs and imaging studies  Micro Results Recent Results (from the past 240 hour(s))  Blood culture (routine x 2)     Status: Abnormal   Collection Time: 06/05/17  2:44 PM  Result Value Ref Range Status   Specimen Description RIGHT ANTECUBITAL  Final   Special Requests   Final    BOTTLES DRAWN AEROBIC ONLY Blood Culture adequate volume   Culture  Setup Time   Final    GRAM POSITIVE COCCI Gram Stain Report Called to,Read Back By and Verified With: ROWE,C AT 0730 BY HUFFINES,S ON 06/06/17. AEROBIC BOTTLE ONLY    Culture (A)  Final    STAPHYLOCOCCUS AUREUS SUSCEPTIBILITIES PERFORMED ON PREVIOUS CULTURE WITHIN THE LAST 5 DAYS. Performed at [facility], [address] [place], [place] [address]    Report  Status 06/08/2017 FINAL  Final  Blood culture (routine x 2)     Status: Abnormal   Collection Time: 06/05/17  2:47 PM  Result Value Ref Range Status   Specimen Description LEFT ANTECUBITAL  Final   Special Requests   Final    BOTTLES DRAWN AEROBIC AND ANAEROBIC Blood Culture adequate volume   Culture  Setup Time   Final    GRAM POSITIVE COCCI BOTH AEB AND ANA Gram Stain Report Called to,Read Back By and Verified With: J.CRUISE, RN @ [facility] ED @ 0545 ON 1.15.19 BY BOWMAN,L CRITICAL RESULT CALLED TO, READ BACK BY AND VERIFIED WITH: N. Batchelder Pharm.D. 8:55 06/06/17 ([email or web address]) Performed at [facility], [address], [place], [place] [address]    Culture STAPHYLOCOCCUS AUREUS (A)  Final   Report Status 06/08/2017 FINAL  Final   Organism ID, Bacteria STAPHYLOCOCCUS AUREUS  Final      Susceptibility   Staphylococcus aureus - MIC*    CIPROFLOXACIN <=0.5 SENSITIVE Sensitive     ERYTHROMYCIN <=0.25 SENSITIVE Sensitive     GENTAMICIN <=0.5 SENSITIVE Sensitive     OXACILLIN <=0.25 SENSITIVE Sensitive     TETRACYCLINE <=1 SENSITIVE Sensitive     VANCOMYCIN <=0.5 SENSITIVE Sensitive     TRIMETH/SULFA <=10 SENSITIVE Sensitive     CLINDAMYCIN <=0.25 SENSITIVE Sensitive     RIFAMPIN <=0.5 SENSITIVE Sensitive     Inducible Clindamycin NEGATIVE Sensitive     * STAPHYLOCOCCUS AUREUS  Blood Culture ID Panel (Reflexed)     Status: Abnormal   Collection Time: 06/05/17  2:47 PM  Result Value Ref Range Status   Enterococcus species NOT DETECTED NOT DETECTED Final   Listeria monocytogenes NOT DETECTED NOT DETECTED Final   Staphylococcus species DETECTED (A) NOT DETECTED Final  Comment: CRITICAL RESULT CALLED TO, READ BACK BY AND VERIFIED WITH: N. Batchelder Pharm.D. 8:55 06/06/17 ([email or web address])    Staphylococcus aureus DETECTED (A) NOT DETECTED Final    Comment: Methicillin (oxacillin) susceptible Staphylococcus aureus (MSSA). Preferred therapy is anti staphylococcal beta lactam antibiotic  (Cefazolin or Nafcillin), unless clinically contraindicated. CRITICAL RESULT CALLED TO, READ BACK BY AND VERIFIED WITH: N. Batchelder Pharm.D. 8:55 06/06/17 ([email or web address])    Methicillin resistance NOT DETECTED NOT DETECTED Final   Streptococcus species NOT DETECTED NOT DETECTED Final   Streptococcus agalactiae NOT DETECTED NOT DETECTED Final   Streptococcus pneumoniae NOT DETECTED NOT DETECTED Final   Streptococcus pyogenes NOT DETECTED NOT DETECTED Final   Acinetobacter baumannii NOT DETECTED NOT DETECTED Final   Enterobacteriaceae species NOT DETECTED NOT DETECTED Final   Enterobacter cloacae complex NOT DETECTED NOT DETECTED Final   Escherichia coli NOT DETECTED NOT DETECTED Final   Klebsiella oxytoca NOT DETECTED NOT DETECTED Final   Klebsiella pneumoniae NOT DETECTED NOT DETECTED Final   Proteus species NOT DETECTED NOT DETECTED Final   Serratia marcescens NOT DETECTED NOT DETECTED Final   Haemophilus influenzae NOT DETECTED NOT DETECTED Final   Neisseria meningitidis NOT DETECTED NOT DETECTED Final   Pseudomonas aeruginosa NOT DETECTED NOT DETECTED Final   Candida albicans NOT DETECTED NOT DETECTED Final   Candida glabrata NOT DETECTED NOT DETECTED Final   Candida krusei NOT DETECTED NOT DETECTED Final   Candida parapsilosis NOT DETECTED NOT DETECTED Final   Candida tropicalis NOT DETECTED NOT DETECTED Final  Culture, blood (routine x 2)     Status: None   Collection Time: 06/06/17  1:45 PM  Result Value Ref Range Status   Specimen Description BLOOD RIGHT ANTECUBITAL  Final   Special Requests   Final    BOTTLES DRAWN AEROBIC AND ANAEROBIC Blood Culture adequate volume   Culture NO GROWTH 5 DAYS  Final   Report Status 06/11/2017 FINAL  Final  Culture, blood (routine x 2)     Status: None   Collection Time: 06/06/17  1:54 PM  Result Value Ref Range Status   Specimen Description BLOOD LEFT ANTECUBITAL  Final   Special Requests   Final    BOTTLES DRAWN AEROBIC AND ANAEROBIC Blood  Culture adequate volume   Culture NO GROWTH 5 DAYS  Final   Report Status 06/11/2017 FINAL  Final  Aerobic/Anaerobic Culture (surgical/deep wound)     Status: None   Collection Time: 06/06/17  4:09 PM  Result Value Ref Range Status   Specimen Description WOUND LEFT HIP  Final   Special Requests PT ON VANC ANCEF ROCEPHIN ZOSYN  Final   Gram Stain   Final    NO WBC SEEN NO SQUAMOUS EPITHELIAL CELLS SEEN NO ORGANISMS SEEN    Culture   Final    RARE STAPHYLOCOCCUS AUREUS NO ANAEROBES ISOLATED    Report Status 06/11/2017 FINAL  Final   Organism ID, Bacteria STAPHYLOCOCCUS AUREUS  Final      Susceptibility   Staphylococcus aureus - MIC*    CIPROFLOXACIN <=0.5 SENSITIVE Sensitive     ERYTHROMYCIN <=0.25 SENSITIVE Sensitive     GENTAMICIN <=0.5 SENSITIVE Sensitive     OXACILLIN 0.5 SENSITIVE Sensitive     TETRACYCLINE <=1 SENSITIVE Sensitive     VANCOMYCIN <=0.5 SENSITIVE Sensitive     TRIMETH/SULFA <=10 SENSITIVE Sensitive     CLINDAMYCIN <=0.25 SENSITIVE Sensitive     RIFAMPIN <=0.5 SENSITIVE Sensitive     Inducible Clindamycin NEGATIVE Sensitive     * RARE  STAPHYLOCOCCUS AUREUS  Surgical pcr screen     Status: Abnormal   Collection Time: 06/06/17  8:38 PM  Result Value Ref Range Status   MRSA, PCR NEGATIVE NEGATIVE Final   Staphylococcus aureus POSITIVE (A) NEGATIVE Final    Comment: (NOTE) The Xpert SA Assay (FDA approved for NASAL specimens in patients 44 years of age and older), is one component of a comprehensive surveillance program. It is not intended to diagnose infection nor to guide or monitor treatment.   Culture, sputum-assessment     Status: None   Collection Time: 06/07/17  9:14 AM  Result Value Ref Range Status   Specimen Description SPUTUM  Final   Special Requests NONE  Final   Sputum evaluation THIS SPECIMEN IS ACCEPTABLE FOR SPUTUM CULTURE  Final   Report Status 06/07/2017 FINAL  Final  Culture, respiratory (NON-Expectorated)     Status: None    Collection Time: 06/07/17  9:14 AM  Result Value Ref Range Status   Specimen Description SPUTUM  Final   Special Requests NONE Reflexed from [id number]  Final   Gram Stain   Final    FEW WBC PRESENT, PREDOMINANTLY PMN NO ORGANISMS SEEN    Culture MODERATE CANDIDA TROPICALIS  Final   Report Status 06/09/2017 FINAL  Final  Aerobic/Anaerobic Culture (surgical/deep wound)     Status: None (Preliminary result)   Collection Time: 06/07/17  3:32 PM  Result Value Ref Range Status   Specimen Description WOUND  Final   Special Requests CHEST WALL ABSCESS  Final   Gram Stain   Final    ABUNDANT WBC PRESENT, PREDOMINANTLY PMN MODERATE GRAM POSITIVE COCCI    Culture   Final    MODERATE STAPHYLOCOCCUS AUREUS NO ANAEROBES ISOLATED    Report Status PENDING  Incomplete   Organism ID, Bacteria STAPHYLOCOCCUS AUREUS  Final      Susceptibility   Staphylococcus aureus - MIC*    CIPROFLOXACIN <=0.5 SENSITIVE Sensitive     ERYTHROMYCIN <=0.25 SENSITIVE Sensitive     GENTAMICIN <=0.5 SENSITIVE Sensitive     OXACILLIN 0.5 SENSITIVE Sensitive     TETRACYCLINE <=1 SENSITIVE Sensitive     VANCOMYCIN 1 SENSITIVE Sensitive     TRIMETH/SULFA <=10 SENSITIVE Sensitive     CLINDAMYCIN <=0.25 SENSITIVE Sensitive     RIFAMPIN <=0.5 SENSITIVE Sensitive     Inducible Clindamycin NEGATIVE Sensitive     * MODERATE STAPHYLOCOCCUS AUREUS  Aerobic/Anaerobic Culture (surgical/deep wound)     Status: None (Preliminary result)   Collection Time: 06/09/17  6:24 PM  Result Value Ref Range Status   Specimen Description ABSCESS LEFT ILIACUS  Final   Special Requests NONE  Final   Gram Stain   Final    ABUNDANT WBC PRESENT, PREDOMINANTLY PMN NO ORGANISMS SEEN    Culture   Final    FEW STAPHYLOCOCCUS AUREUS SUSCEPTIBILITIES PERFORMED ON PREVIOUS CULTURE WITHIN THE LAST 5 DAYS. NO ANAEROBES ISOLATED; CULTURE IN PROGRESS FOR 5 DAYS    Report Status PENDING  Incomplete    Radiology Reports Dg Chest 2 View  Result  Date: 06/05/2017 CLINICAL DATA:  Shortness of Breath EXAM: CHEST  2 VIEW COMPARISON:  December 01, 2011 FINDINGS: There is patchy infiltrate in the lateral right base. Lungs elsewhere are clear. Heart size and pulmonary vascularity are normal. No adenopathy. No bone lesions. IMPRESSION: Infiltrate consistent with pneumonia lateral right base. Lungs elsewhere clear. Cardiac silhouette within normal limits. Electronically Signed   By: Bretta Bang III M.D.   On:  06/05/2017 14:47   Ct Soft Tissue Neck W Contrast  Result Date: 06/05/2017 CLINICAL DATA:  Neck mass.  Hard, red, raised area in the neck. EXAM: CT NECK WITH CONTRAST TECHNIQUE: Multidetector CT imaging of the neck was performed using the standard protocol following the bolus administration of intravenous contrast. CONTRAST:  ISOVUE-300 IOPAMIDOL (ISOVUE-300) INJECTION 61% COMPARISON:  None. FINDINGS: Pharynx and larynx: No pharyngeal or laryngeal mass. Patent airway. No retropharyngeal fluid collection. Salivary glands: No inflammation, mass, or stone. Thyroid: Mass effect on the right thyroid lobe by the inflammatory process described below. No intrinsic thyroid abnormality identified. Lymph nodes: Subcentimeter but asymmetric anterior and posterior cervical lymph nodes throughout the right neck, likely reactive. Vascular: Major vascular structures of the neck are patent. The below described inflammatory process extends into the right carotid space in the lower neck. Limited intracranial: Unremarkable. Visualized orbits: Not imaged. Mastoids and visualized paranasal sinuses: Partially visualized polypoid mucosal thickening or small mucous retention cyst in the left maxillary sinus. Visualized mastoid air cells are clear. Skeleton: There is a heterogeneous gas and fluid collection along the medial aspect of the right sternocleidomastoid muscle in the lower neck beginning near the clavicular head. This collection extends posteriorly and inferiorly  into the anterior mediastinum, more fully evaluated on the separate chest CT. Phlegmon extends superiorly within/along the right sternocleidomastoid muscle in the lower and mid neck. Fluid extends superiorly in the right neck deep to the strap muscles and contains a few locules of gas, however this fluid does not appear as organized as the collections in the lower neck and upper chest. Inflammation in the right neck extends just superior to the level of the thyroid cartilage. No erosive changes are seen at the right sternoclavicular joint. Upper chest: Reported separately. Other: None. IMPRESSION: Gas and fluid collections in the anterior upper chest as reported on separate chest CT, concerning for abscesses. Phlegmon and less organized gas and fluid extend superiorly in the anterior right neck with suspected myositis of the right sternocleidomastoid muscle. Electronically Signed   By: Sebastian Ache M.D.   On: 06/05/2017 16:31   Ct Chest W Contrast  Result Date: 06/05/2017 CLINICAL DATA:  Patient with shortness of breath. Red raised area anterior lower neck. EXAM: CT CHEST WITH CONTRAST TECHNIQUE: Multidetector CT imaging of the chest was performed during intravenous contrast administration. CONTRAST:  ISOVUE-300 IOPAMIDOL (ISOVUE-300) INJECTION 61% COMPARISON:  Chest CT 06/21/2005; neck CT same day. FINDINGS: Cardiovascular: Normal heart size. No pericardial effusion. Normal caliber thoracic aorta. Enlarged main pulmonary artery (3.3 cm) as can be seen with pulmonary arterial hypertension. Mediastinum/Nodes: 1.2 cm AP window lymph node (image 50; series 2). 7 mm right paratracheal lymph node (image 50; series 2). Normal appearance of the esophagus. No axillary adenopathy. Lungs/Pleura: Central airways are patent. Subpleural consolidation with adjacent tree-in-bud ground-glass nodularity within the right lower lobe (image 114; series 4). Within the right upper lobe there multiple areas of regional  ground-glass attenuation. No pleural effusion or pneumothorax. Upper Abdomen: No acute process. Musculoskeletal: There is a large centrally necrotic peripherally enhancing mass within the lower neck which extends from the anterior aspect of the right sternal clavicular joint/distal right sternocleidomastoid superiorly along the right aspect of the thyroid and inferiorly within the retrosternal location. The largest portion of this mass measures 4.7 x 4.1 cm. There are multiple small foci of gas within the mass. No definite osseous destruction demonstrated within the right sternoclavicular joint. IMPRESSION: 1. There is a large peripherally enhancing  mass with central fluid and gas within the right lower neck extending from the anterior aspect of the right sternoclavicular location posteriorly along the retrosternal soft tissues. Findings are concerning for abscess. No definite osseous destruction at this time of the right sternoclavicular joint or adjacent costochondral joint however secondary infection of these joints is not excluded given the size of the suspected abscess. 2. Consolidation within the right lower lobe with right upper lobe ground-glass opacities favored to represent multifocal pneumonia. 3. Mediastinal and right hilar adenopathy likely reactive in etiology. Critical Value/emergent results were called by telephone at the time of interpretation on 06/05/2017 at 4:18 pm to Dr. Fayrene Fearing, who verbally acknowledged these results. Electronically Signed   By: Annia Belt M.D.   On: 06/05/2017 16:29   Mr Hip Left W Wo Contrast  Result Date: 06/06/2017 CLINICAL DATA:  Severe left hip pain for the past few weeks. History of IV drug abuse and recurrent skin and soft tissue infections. EXAM: MRI OF THE LEFT HIP WITHOUT AND WITH CONTRAST TECHNIQUE: Multiplanar, multisequence MR imaging was performed both before and after administration of intravenous contrast. CONTRAST:  15mL MULTIHANCE GADOBENATE DIMEGLUMINE  529 MG/ML IV SOLN COMPARISON:  None. FINDINGS: Bones: There is no evidence of acute fracture, dislocation or avascular necrosis. The visualized bony pelvis appears normal. The visualized sacroiliac joints and symphysis pubis appear normal. Articular cartilage and labrum Articular cartilage: No focal chondral defect or subchondral signal abnormality identified. Labrum: There is a tear of the left anterior superior labrum. Joint or bursal effusion Joint effusion: Small left hip joint effusion with prominent synovial enhancement. No right hip joint effusion. Bursae: Prominent, rim enhancing fluid within the left iliopsoas bursa, extending superiorly into the left iliacus muscle. The bursa measures up to 2.5 x 3.3 cm in maximal AP by TR dimension. No greater trochanteric bursal fluid. Muscles and tendons Muscles and tendons: Prominent muscle edema and enhancement of the left iliacus muscle. Mild myofascial edema along the left vastus and adductor compartments. The visualized gluteus, hamstring and iliopsoas tendons appear normal. The piriformis muscles appear symmetric. Other findings Miscellaneous: Trace presacral edema. The visualized internal pelvic contents otherwise appear unremarkable. IMPRESSION: 1. Findings consistent with left hip septic arthritis and left iliopsoas septic bursitis. The left iliopsoas bursa extends into the left iliacus muscle, which demonstrates prominent muscle edema and enhancement, consistent with infectious myositis. The preliminary findings were called by telephone at the time of interpretation on 06/06/2017 at 4:00 am to Dr. Marcial Pacas OPYD, by Dr. Elgie Collard. Electronically Signed   By: Obie Dredge M.D.   On: 06/06/2017 08:15   Dg Chest Port 1 View  Result Date: 06/07/2017 CLINICAL DATA:  Shortness of breath, post debridement of RIGHT sternoclavicular abscess EXAM: PORTABLE CHEST 1 VIEW COMPARISON:  Portable exam 1639 hours compared to 06/05/2017 FINDINGS: Normal heart size,  mediastinal contours, and pulmonary vascularity. Mild bronchitic changes. Question mild atelectasis versus infiltrate at medial RIGHT lung base. No additional infiltrate, pleural effusion or pneumothorax. No acute osseous findings. IMPRESSION: Mild chronic bronchitic changes with question minimal atelectasis versus infiltrate at medial RIGHT lung base. Electronically Signed   By: Ulyses Southward M.D.   On: 06/07/2017 17:47   Ct Image Guided Drainage By Percutaneous Catheter  Result Date: 06/09/2017 INDICATION: 37 year old male IV drug user with left iliacus abscess. EXAM: CT GUIDED DRAINAGE OF  ABSCESS MEDICATIONS: The patient is currently admitted to the hospital and receiving intravenous antibiotics. The antibiotics were administered within an appropriate time frame prior to  the initiation of the procedure. ANESTHESIA/SEDATION: 4.5 mg IV Versed 200 mcg IV Fentanyl Moderate Sedation Time:  25 minutes The patient was continuously monitored during the procedure by the interventional radiology nurse under my direct supervision. COMPLICATIONS: None immediate. TECHNIQUE: Informed written consent was obtained from the patient after a thorough discussion of the procedural risks, benefits and alternatives. All questions were addressed. Maximal Sterile Barrier Technique was utilized including caps, mask, sterile gowns, sterile gloves, sterile drape, hand hygiene and skin antiseptic. A timeout was performed prior to the initiation of the procedure. PROCEDURE: The operative field was prepped with Chlorhexidine in a sterile fashion, and a sterile drape was applied covering the operative field. A sterile gown and sterile gloves were used for the procedure. Local anesthesia was provided with 1% Lidocaine. A planning axial CT scan was performed. The fluid and gas collection in the left iliac fossa was successfully identified. Local anesthesia was attained by infiltration with 1% lidocaine. A small dermatotomy was made. Under  intermittent CT guidance, an 18 gauge trocar needle was advanced into the fluid collection. A 0.035 Amplatz wire was advanced. The trocar needle was removed. The skin tract was dilated to 12 [place]. A 12 French all-purpose drainage catheter was advanced over the wire and formed. Aspiration yields approximately 30 mL thick purulent fluid. A sample was sent for culture. Catheter was secured to the skin with 0 Prolene suture. A sterile bandage was placed. Post drain placement imaging demonstrates a well-positioned drainage catheter with near-total resolution of the abscess cavity. FINDINGS: Left iliacus abscess. IMPRESSION: Successful placement of a 12 French drainage catheter into the left iliacus abscess. PLAN: Maintain drain to JP bulb suction.  Flush every shift. When drain output is less than 10 mL per day for 2 consecutive days, the drain may be removed. Electronically Signed   By: Malachy Moan M.D.   On: 06/09/2017 18:59    Lab Data:  CBC: Recent Labs  Lab 06/05/17 1444 06/05/17 1457 06/06/17 0315 06/07/17 0210 06/08/17 0541 06/09/17 1237  WBC 23.0*  --  19.7* 18.0* 21.5* 19.4*  NEUTROABS 18.8*  --  15.1*  --   --   --   HGB 13.3 14.6 11.0* 11.2* 11.5* 12.2*  HCT 39.4 43.0 33.1* 33.1* 34.6* 36.4*  MCV 90.6  --  90.2 89.9 89.9 89.4  PLT 318  --  317 334 398 500*   Basic Metabolic Panel: Recent Labs  Lab 06/08/17 0541 06/09/17 1053 06/10/17 0635 06/11/17 0915 06/12/17 0718  NA 136 135 135 136 140  K 4.1 3.8 3.4* 4.0 4.1  CL 100* 102 102 103 103  CO2 24 22 22 22 26   GLUCOSE 168* 159* 121* 105* 105*  BUN 10 13 10 7 10   CREATININE 0.62 0.67 0.62 0.56* 0.53*  CALCIUM 8.6* 8.8* 8.5* 8.8* 9.1   GFR: Estimated Creatinine Clearance: 128.2 mL/min (A) (by C-G formula based on SCr of 0.53 mg/dL (L)). Liver Function Tests: Recent Labs  Lab 06/05/17 1444 06/06/17 0315 06/07/17 0210 06/09/17 1053  AST 67* 43* 41 42*  ALT 63 47 39 36  ALKPHOS 201* 176* 185* 165*  BILITOT 0.5  0.3 0.3 0.6  PROT 7.5 6.4* 5.6* 6.6  ALBUMIN 2.9* 2.3* 2.1* 2.6*   No results for input(s): LIPASE, AMYLASE in the last 168 hours. No results for input(s): AMMONIA in the last 168 hours. Coagulation Profile: Recent Labs  Lab 06/07/17 0210  INR 1.11   Cardiac Enzymes: No results for input(s): CKTOTAL, CKMB, CKMBINDEX,  TROPONINI in the last 168 hours. BNP (last 3 results) No results for input(s): PROBNP in the last 8760 hours. HbA1C: No results for input(s): HGBA1C in the last 72 hours. CBG: No results for input(s): GLUCAP in the last 168 hours. Lipid Profile: No results for input(s): CHOL, HDL, LDLCALC, TRIG, CHOLHDL, LDLDIRECT in the last 72 hours. Thyroid Function Tests: No results for input(s): TSH, T4TOTAL, FREET4, T3FREE, THYROIDAB in the last 72 hours. Anemia Panel: No results for input(s): VITAMINB12, FOLATE, FERRITIN, TIBC, IRON, RETICCTPCT in the last 72 hours. Urine analysis:    Component Value Date/Time   COLORURINE YELLOW 06/06/2017 1333   APPEARANCEUR CLEAR 06/06/2017 1333   LABSPEC 1.010 06/06/2017 1333   PHURINE 6.0 06/06/2017 1333   GLUCOSEU NEGATIVE 06/06/2017 1333   HGBUR NEGATIVE 06/06/2017 1333   BILIRUBINUR NEGATIVE 06/06/2017 1333   KETONESUR NEGATIVE 06/06/2017 1333   PROTEINUR NEGATIVE 06/06/2017 1333   UROBILINOGEN 0.2 10/03/2012 0856   NITRITE NEGATIVE 06/06/2017 1333   LEUKOCYTESUR NEGATIVE 06/06/2017 1333     Ripudeep Rai M.D. [facility] 06/12/2017, 1:04 PM  Pager: [phone] Between 7am to 7pm - call Pager - [phone]  After 7pm go to [email or web address] - password [email or web address]  Call night coverage person covering after 7pm

## 2017-06-12 NOTE — Op Note (Signed)
NAMCarilyn Goodpasture:  Ruedas, Lavin           ACCOUNT NO.:  [id number]  MEDICAL RECORD NO.:  [id number]  LOCATION:                                 FACILITY:  PHYSICIAN:  Kerin PernaPeter Van Trigt, M.D.  DATE OF BIRTH:  1980/09/06  DATE OF PROCEDURE:  06/07/2017 DATE OF DISCHARGE:                              OPERATIVE REPORT   OPERATION:  Debridement and drainage of right sternoclavicular joint abscess with application of wound VAC.  SURGEON:  Kerin PernaPeter Van Trigt, M.D.  ANESTHESIA:  General.  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  DESCRIPTION OF PROCEDURE:  The patient was brought from the preop holding area after informed consent was documented with the patient and family and the proper side marked.  The patient was placed supine on the operating table and general anesthesia was induced.  The right neck and chest were prepped and draped as a sterile field.  A proper time-out was performed.  Preoperative antibiotics had been administered.  Incision was made centered on the right sternoclavicular joint over the area of erythema and induration.  After the subcutaneous layer was opened, pus drained out and this was sent for cultures for routine AFB and fungal. The incision was carried down deeper into the soft tissues of the neck and chest.  The pleural space was not entered.  The clavicle had been partially destroyed.  This was debrided with rongeur and the curette. The sternal bone appeared to be intact.  A pocket of infection deep to the right clavicle into the superior mediastinum was opened and drained. Significant bleeding was not encountered, but the innominate artery was closed.  The wound was then irrigated with pulse lavage of 1.5 L.  Hemostasis was achieved.  Wound VAC sponges were placed, first a small white  sponge into the deep superior mediastinal cavity and then a black standard sponge on top.  The wound VAC sterile drapes were applied.  Suction line was applied and connected to the pump and the sponges were placed on suction.  The patient was reversed from anesthesia and returned to recovery room in stable condition.    Kerin PernaPeter Van Trigt, M.D.   ______________________________ Kerin PernaPeter Van Trigt, M.D.   PV/MEDQ  D:  06/11/2017  T:  06/11/2017  Job:  [id number]

## 2017-06-13 DIAGNOSIS — M00811 Arthritis due to other bacteria, right shoulder: Secondary | ICD-10-CM

## 2017-06-13 LAB — BASIC METABOLIC PANEL
Anion gap: 11 (ref 5–15)
BUN: 10 mg/dL (ref 6–20)
CO2: 24 mmol/L (ref 22–32)
Calcium: 9 mg/dL (ref 8.9–10.3)
Chloride: 100 mmol/L — ABNORMAL LOW (ref 101–111)
Creatinine, Ser: 0.48 mg/dL — ABNORMAL LOW (ref 0.61–1.24)
GFR calc Af Amer: >60 mL/min (ref >60)
GFR calc non Af Amer: >60 mL/min (ref >60)
Glucose, Bld: 92 mg/dL (ref 65–99)
Potassium: 3.7 mmol/L (ref 3.5–5.1)
Sodium: 135 mmol/L (ref 135–145)

## 2017-06-13 NOTE — Progress Notes (Signed)
[facility]                                                                              Patient Demographics  Clifford Tucker, is a 37 y.o. male, DOB - Dec 10, 1980, XWR:[id number]  Admit date - 06/05/2017   Admitting Physician Briscoe Deutscher, MD  Outpatient Primary MD for the patient is Patient, No Pcp Per  Outpatient specialists:   LOS - 8  days   Medical records reviewed and are as summarized below:    Chief Complaint  Patient presents with  . Oral Swelling       Brief summary   Clifford Tucker is a 37 y.o. malewith medical history significant forIV drug abuse and recurrent skin and soft tissue infections. He presented with neck/hip pain found to have a neck abscess, septic arthritis of the hip and now MSSA bacteremia. CT surgery, orthopedic surgery and infectious disease consulted.   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  Reported having history of multiple abscesses over the last few weeks -Patient was originally placed on vancomycin and Zosyn, transition to IV Ancef -Sternal aspirate showed MSSA, left hip wound cultures also MSSA -2D echo with no vegetation.  ID following, recommended continue cefazolin -Cont IV ancef for 6 weeks through March 1st. SW working on SNF placement, difficult placement due to drug use.   Active Problems:   Multifocal pneumonia -ID following, continue cefazolin  Left hip abscess/iliopsoas septic arthritis, infectious myositis -Status post left hip I&D with cultures done on 1/15, showing staph aureus -Continue Ancef for 6 weeks through March 1st -Status post drain placement 1/18 for iliacus abscess, IV following closely, draining well, recommended needs CT when output is less than 10 mL per day. Will follow  as outpatient if patient is discharged with drain -Doppler ultrasound of the lower extremity, left negative for any DVT  Septic arthritis of the sternoclavicular joint, abscess -CTVS following, underwent debridement with wound VAC of the neck abscess - Continue current VAC change schedule and antibiotics    IV drug user -Per patient, has been using heroin, [facility] but relapsed again in a month before the admission -Last heroin use a week ago PTA, so far not in any withdrawals -Social work consulted for rehab/SNF placement   Nicotine abuse -Nicotine patch placed   Code Status: Full code DVT Prophylaxis: Lovenox Family Communication: Discussed in detail with the patient, all imaging results, lab results explained to the patient    Disposition Plan: Social work assisting on skilled nursing facility  Time Spent in minutes   15 minutes  Procedures:  I&D left hip 1/15  Consultants:   Orthopedics CT VS ID IR  Antimicrobials:      Medications  Scheduled Meds: . enoxaparin (LOVENOX) injection  40 mg Subcutaneous Q24H  . iopamidol  20 mL Intra-articular Once  . lidocaine (PF)  5 mL Intradermal Once  . nicotine  21 mg Transdermal Daily  . pantoprazole  40 mg Oral Q0600  . sodium chloride  10 mL Intravenous Once  . sodium chloride flush  5 mL Intravenous Q8H   Continuous Infusions: . sodium chloride    .  ceFAZolin (ANCEF) IV Stopped (06/13/17 1005)  . dextrose 5 % and 0.45% NaCl 30 mL/hr at 06/07/17 2000  . lactated ringers 10 mL/hr at 06/06/17 1406  . lactated ringers 10 mL/hr at 06/07/17 1354  . potassium chloride     PRN Meds:.acetaminophen **OR** acetaminophen, alum & mag hydroxide-simeth, diphenhydrAMINE, fentaNYL (SUBLIMAZE) injection, HYDROmorphone (DILAUDID) injection, metoCLOPramide **OR** metoCLOPramide (REGLAN) injection, ondansetron **OR** ondansetron (ZOFRAN) IV, oxyCODONE, potassium chloride, senna-docusate,  zolpidem   Antibiotics   Anti-infectives (From admission, onward)   Start     Dose/Rate Route Frequency Ordered Stop   06/07/17 1400  cefUROXime (ZINACEF) 1.5 g in dextrose 5 % 50 mL IVPB     1.5 g 100 mL/hr over 30 Minutes Intravenous To [facility]Stay Surgical 06/07/17 0125 06/07/17 1527   06/07/17 0745  vancomycin (VANCOCIN) 1,000 mg in sodium chloride 0.9 % 1,000 mL irrigation      Irrigation To Surgery 06/07/17 0744 06/07/17 1513   06/06/17 1639  gentamicin (GARAMYCIN) injection  Status:  Discontinued       As needed 06/06/17 1640 06/06/17 1715   06/06/17 1638  vancomycin (VANCOCIN) powder  Status:  Discontinued       As needed 06/06/17 1639 06/06/17 1715   06/06/17 1000  ceFAZolin (ANCEF) IVPB 2g/100 mL premix     2 g 200 mL/hr over 30 Minutes Intravenous Every 8 hours 06/06/17 0911     06/06/17 0000  vancomycin (VANCOCIN) IVPB 1000 mg/200 mL premix  Status:  Discontinued     1,000 mg 200 mL/hr over 60 Minutes Intravenous Every 8 hours 06/05/17 2211 06/06/17 0911   06/05/17 2330  piperacillin-tazobactam (ZOSYN) IVPB 3.375 g  Status:  Discontinued     3.375 g 12.5 mL/hr over 240 Minutes Intravenous Every 8 hours 06/05/17 2211 06/06/17 0911   06/05/17 2200  cefTRIAXone (ROCEPHIN) 1 g in dextrose 5 % 50 mL IVPB  Status:  Discontinued     1 g 100 mL/hr over 30 Minutes Intravenous Every 24 hours 06/05/17 2140 06/05/17 2202   06/05/17 2200  azithromycin (ZITHROMAX) 500 mg in dextrose 5 % 250 mL IVPB  Status:  Discontinued     500 mg 250 mL/hr over 60 Minutes Intravenous Every 24 hours 06/05/17 2140 06/06/17 0911   06/05/17 1600  vancomycin (VANCOCIN) 1,500 mg in sodium chloride 0.9 % 500 mL IVPB     1,500 mg 250 mL/hr over 120 Minutes Intravenous  Once 06/05/17 1542 06/05/17 1855   06/05/17 1515  piperacillin-tazobactam (ZOSYN) IVPB 3.375 g     3.375 g 100 mL/hr over 30 Minutes Intravenous  Once 06/05/17 1512 06/05/17 1557        Subjective:   Clifford Tucker was seen and  examined today. States pain is not well controlled but does not like fentanyl does not help. Slept okay. Had BM yesterday.   Patient denies dizziness, chest pain, shortness of breath, abdominal pain, N/V/D/C, new weakness, numbess, tingling.    Objective:   Vitals:   06/12/17 0620 06/12/17 1344 06/12/17 2135 06/13/17 0452  BP: 120/68 119/74 117/65 124/77  Pulse: 71 80 78 75  Resp: 16 16 16 16   Temp: 98.8 F (37.1 C) 98.5 F (36.9 C) 98.3 F (36.8 C) 98.2 F (36.8 C)  TempSrc:  Oral Oral Oral Oral  SpO2: 99% 100% 100% 100%  Weight:      Height:        Intake/Output Summary (Last 24 hours) at 06/13/2017 1247 Last data filed at 06/12/2017 1800 Gross per 24 hour  Intake 205 ml  Output 14 ml  Net 191 ml     Wt Readings from Last 3 Encounters:  06/06/17 70.3 kg (154 lb 15.7 oz)  10/03/12 72.6 kg (160 lb)     Exam   General: Alert and oriented x 3, NAD  Eyes:   HEENT:  wound VAC +  Cardiovascular: S1 S2 clear, RRR. No pedal edema b/l  Respiratory: CTA B  Gastrointestinal: Soft, nontender, nondistended, + bowel sounds  Ext: no pedal edema bilaterally  Neuro: no new deficit  Musculoskeletal: No digital cyanosis, clubbing  Skin: No rashes  Psych: Normal affect and demeanor, alert and oriented x3    Data Reviewed:  I have personally reviewed following labs and imaging studies  Micro Results Recent Results (from the past 240 hour(s))  Blood culture (routine x 2)     Status: Abnormal   Collection Time: 06/05/17  2:44 PM  Result Value Ref Range Status   Specimen Description RIGHT ANTECUBITAL  Final   Special Requests   Final    BOTTLES DRAWN AEROBIC ONLY Blood Culture adequate volume   Culture  Setup Time   Final    GRAM POSITIVE COCCI Gram Stain Report Called to,Read Back By and Verified With: ROWE,C AT 0730 BY HUFFINES,S ON 06/06/17. AEROBIC BOTTLE ONLY    Culture (A)  Final    STAPHYLOCOCCUS AUREUS SUSCEPTIBILITIES PERFORMED ON PREVIOUS CULTURE WITHIN  THE LAST 5 DAYS. Performed at [facility], [address], [place], [place] [address]    Report Status 06/08/2017 FINAL  Final  Blood culture (routine x 2)     Status: Abnormal   Collection Time: 06/05/17  2:47 PM  Result Value Ref Range Status   Specimen Description LEFT ANTECUBITAL  Final   Special Requests   Final    BOTTLES DRAWN AEROBIC AND ANAEROBIC Blood Culture adequate volume   Culture  Setup Time   Final    GRAM POSITIVE COCCI BOTH AEB AND ANA Gram Stain Report Called to,Read Back By and Verified With: J.CRUISE, RN @ [facility] @ 0545 ON 1.15.19 BY BOWMAN,L CRITICAL RESULT CALLED TO, READ BACK BY AND VERIFIED WITH: N. Batchelder Pharm.D. 8:55 06/06/17 ([email or web address]) Performed at [facility], [address], [place], [place] [address]    Culture STAPHYLOCOCCUS AUREUS (A)  Final   Report Status 06/08/2017 FINAL  Final   Organism ID, Bacteria STAPHYLOCOCCUS AUREUS  Final      Susceptibility   Staphylococcus aureus - MIC*    CIPROFLOXACIN <=0.5 SENSITIVE Sensitive     ERYTHROMYCIN <=0.25 SENSITIVE Sensitive     GENTAMICIN <=0.5 SENSITIVE Sensitive     OXACILLIN <=0.25 SENSITIVE Sensitive     TETRACYCLINE <=1 SENSITIVE Sensitive     VANCOMYCIN <=0.5 SENSITIVE Sensitive     TRIMETH/SULFA <=10 SENSITIVE Sensitive     CLINDAMYCIN <=0.25 SENSITIVE Sensitive     RIFAMPIN <=0.5 SENSITIVE Sensitive     Inducible Clindamycin NEGATIVE Sensitive     * STAPHYLOCOCCUS AUREUS  Blood Culture ID Panel (Reflexed)     Status: Abnormal   Collection Time: 06/05/17  2:47 PM  Result Value Ref Range Status   Enterococcus species NOT DETECTED NOT DETECTED Final   Listeria monocytogenes NOT DETECTED NOT  DETECTED Final   Staphylococcus species DETECTED (A) NOT DETECTED Final    Comment: CRITICAL RESULT CALLED TO, READ BACK BY AND VERIFIED WITH: N. Batchelder Pharm.D. 8:55 06/06/17 ([email or web address])    Staphylococcus aureus DETECTED (A) NOT DETECTED Final    Comment: Methicillin (oxacillin)  susceptible Staphylococcus aureus (MSSA). Preferred therapy is anti staphylococcal beta lactam antibiotic (Cefazolin or Nafcillin), unless clinically contraindicated. CRITICAL RESULT CALLED TO, READ BACK BY AND VERIFIED WITH: N. Batchelder Pharm.D. 8:55 06/06/17 ([email or web address])    Methicillin resistance NOT DETECTED NOT DETECTED Final   Streptococcus species NOT DETECTED NOT DETECTED Final   Streptococcus agalactiae NOT DETECTED NOT DETECTED Final   Streptococcus pneumoniae NOT DETECTED NOT DETECTED Final   Streptococcus pyogenes NOT DETECTED NOT DETECTED Final   Acinetobacter baumannii NOT DETECTED NOT DETECTED Final   Enterobacteriaceae species NOT DETECTED NOT DETECTED Final   Enterobacter cloacae complex NOT DETECTED NOT DETECTED Final   Escherichia coli NOT DETECTED NOT DETECTED Final   Klebsiella oxytoca NOT DETECTED NOT DETECTED Final   Klebsiella pneumoniae NOT DETECTED NOT DETECTED Final   Proteus species NOT DETECTED NOT DETECTED Final   Serratia marcescens NOT DETECTED NOT DETECTED Final   Haemophilus influenzae NOT DETECTED NOT DETECTED Final   Neisseria meningitidis NOT DETECTED NOT DETECTED Final   Pseudomonas aeruginosa NOT DETECTED NOT DETECTED Final   Candida albicans NOT DETECTED NOT DETECTED Final   Candida glabrata NOT DETECTED NOT DETECTED Final   Candida krusei NOT DETECTED NOT DETECTED Final   Candida parapsilosis NOT DETECTED NOT DETECTED Final   Candida tropicalis NOT DETECTED NOT DETECTED Final  Culture, blood (routine x 2)     Status: None   Collection Time: 06/06/17  1:45 PM  Result Value Ref Range Status   Specimen Description BLOOD RIGHT ANTECUBITAL  Final   Special Requests   Final    BOTTLES DRAWN AEROBIC AND ANAEROBIC Blood Culture adequate volume   Culture NO GROWTH 5 DAYS  Final   Report Status 06/11/2017 FINAL  Final  Culture, blood (routine x 2)     Status: None   Collection Time: 06/06/17  1:54 PM  Result Value Ref Range Status   Specimen Description  BLOOD LEFT ANTECUBITAL  Final   Special Requests   Final    BOTTLES DRAWN AEROBIC AND ANAEROBIC Blood Culture adequate volume   Culture NO GROWTH 5 DAYS  Final   Report Status 06/11/2017 FINAL  Final  Aerobic/Anaerobic Culture (surgical/deep wound)     Status: None   Collection Time: 06/06/17  4:09 PM  Result Value Ref Range Status   Specimen Description WOUND LEFT HIP  Final   Special Requests PT ON VANC ANCEF ROCEPHIN ZOSYN  Final   Gram Stain   Final    NO WBC SEEN NO SQUAMOUS EPITHELIAL CELLS SEEN NO ORGANISMS SEEN    Culture   Final    RARE STAPHYLOCOCCUS AUREUS NO ANAEROBES ISOLATED    Report Status 06/11/2017 FINAL  Final   Organism ID, Bacteria STAPHYLOCOCCUS AUREUS  Final      Susceptibility   Staphylococcus aureus - MIC*    CIPROFLOXACIN <=0.5 SENSITIVE Sensitive     ERYTHROMYCIN <=0.25 SENSITIVE Sensitive     GENTAMICIN <=0.5 SENSITIVE Sensitive     OXACILLIN 0.5 SENSITIVE Sensitive     TETRACYCLINE <=1 SENSITIVE Sensitive     VANCOMYCIN <=0.5 SENSITIVE Sensitive     TRIMETH/SULFA <=10 SENSITIVE Sensitive     CLINDAMYCIN <=0.25 SENSITIVE Sensitive     RIFAMPIN <=0.5 SENSITIVE Sensitive  Inducible Clindamycin NEGATIVE Sensitive     * RARE STAPHYLOCOCCUS AUREUS  Surgical pcr screen     Status: Abnormal   Collection Time: 06/06/17  8:38 PM  Result Value Ref Range Status   MRSA, PCR NEGATIVE NEGATIVE Final   Staphylococcus aureus POSITIVE (A) NEGATIVE Final    Comment: (NOTE) The Xpert SA Assay (FDA approved for NASAL specimens in patients 82 years of age and older), is one component of a comprehensive surveillance program. It is not intended to diagnose infection nor to guide or monitor treatment.   Culture, sputum-assessment     Status: None   Collection Time: 06/07/17  9:14 AM  Result Value Ref Range Status   Specimen Description SPUTUM  Final   Special Requests NONE  Final   Sputum evaluation THIS SPECIMEN IS ACCEPTABLE FOR SPUTUM CULTURE  Final    Report Status 06/07/2017 FINAL  Final  Culture, respiratory (NON-Expectorated)     Status: None   Collection Time: 06/07/17  9:14 AM  Result Value Ref Range Status   Specimen Description SPUTUM  Final   Special Requests NONE Reflexed from [id number]  Final   Gram Stain   Final    FEW WBC PRESENT, PREDOMINANTLY PMN NO ORGANISMS SEEN    Culture MODERATE CANDIDA TROPICALIS  Final   Report Status 06/09/2017 FINAL  Final  Aerobic/Anaerobic Culture (surgical/deep wound)     Status: None   Collection Time: 06/07/17  3:32 PM  Result Value Ref Range Status   Specimen Description WOUND  Final   Special Requests CHEST WALL ABSCESS  Final   Gram Stain   Final    ABUNDANT WBC PRESENT, PREDOMINANTLY PMN MODERATE GRAM POSITIVE COCCI    Culture   Final    MODERATE STAPHYLOCOCCUS AUREUS NO ANAEROBES ISOLATED    Report Status 06/12/2017 FINAL  Final   Organism ID, Bacteria STAPHYLOCOCCUS AUREUS  Final      Susceptibility   Staphylococcus aureus - MIC*    CIPROFLOXACIN <=0.5 SENSITIVE Sensitive     ERYTHROMYCIN <=0.25 SENSITIVE Sensitive     GENTAMICIN <=0.5 SENSITIVE Sensitive     OXACILLIN 0.5 SENSITIVE Sensitive     TETRACYCLINE <=1 SENSITIVE Sensitive     VANCOMYCIN 1 SENSITIVE Sensitive     TRIMETH/SULFA <=10 SENSITIVE Sensitive     CLINDAMYCIN <=0.25 SENSITIVE Sensitive     RIFAMPIN <=0.5 SENSITIVE Sensitive     Inducible Clindamycin NEGATIVE Sensitive     * MODERATE STAPHYLOCOCCUS AUREUS  Aerobic/Anaerobic Culture (surgical/deep wound)     Status: None (Preliminary result)   Collection Time: 06/09/17  6:24 PM  Result Value Ref Range Status   Specimen Description ABSCESS LEFT ILIACUS  Final   Special Requests NONE  Final   Gram Stain   Final    ABUNDANT WBC PRESENT, PREDOMINANTLY PMN NO ORGANISMS SEEN    Culture   Final    FEW STAPHYLOCOCCUS AUREUS SUSCEPTIBILITIES PERFORMED ON PREVIOUS CULTURE WITHIN THE LAST 5 DAYS. NO ANAEROBES ISOLATED; CULTURE IN PROGRESS FOR 5 DAYS     Report Status PENDING  Incomplete    Radiology Reports Dg Chest 2 View  Result Date: 06/05/2017 CLINICAL DATA:  Shortness of Breath EXAM: CHEST  2 VIEW COMPARISON:  December 01, 2011 FINDINGS: There is patchy infiltrate in the lateral right base. Lungs elsewhere are clear. Heart size and pulmonary vascularity are normal. No adenopathy. No bone lesions. IMPRESSION: Infiltrate consistent with pneumonia lateral right base. Lungs elsewhere clear. Cardiac silhouette within normal limits. Electronically Signed  By: Bretta Bang III M.D.   On: 06/05/2017 14:47   Ct Soft Tissue Neck W Contrast  Result Date: 06/05/2017 CLINICAL DATA:  Neck mass.  Hard, red, raised area in the neck. EXAM: CT NECK WITH CONTRAST TECHNIQUE: Multidetector CT imaging of the neck was performed using the standard protocol following the bolus administration of intravenous contrast. CONTRAST:  ISOVUE-300 IOPAMIDOL (ISOVUE-300) INJECTION 61% COMPARISON:  None. FINDINGS: Pharynx and larynx: No pharyngeal or laryngeal mass. Patent airway. No retropharyngeal fluid collection. Salivary glands: No inflammation, mass, or stone. Thyroid: Mass effect on the right thyroid lobe by the inflammatory process described below. No intrinsic thyroid abnormality identified. Lymph nodes: Subcentimeter but asymmetric anterior and posterior cervical lymph nodes throughout the right neck, likely reactive. Vascular: Major vascular structures of the neck are patent. The below described inflammatory process extends into the right carotid space in the lower neck. Limited intracranial: Unremarkable. Visualized orbits: Not imaged. Mastoids and visualized paranasal sinuses: Partially visualized polypoid mucosal thickening or small mucous retention cyst in the left maxillary sinus. Visualized mastoid air cells are clear. Skeleton: There is a heterogeneous gas and fluid collection along the medial aspect of the right sternocleidomastoid muscle in the lower neck  beginning near the clavicular head. This collection extends posteriorly and inferiorly into the anterior mediastinum, more fully evaluated on the separate chest CT. Phlegmon extends superiorly within/along the right sternocleidomastoid muscle in the lower and mid neck. Fluid extends superiorly in the right neck deep to the strap muscles and contains a few locules of gas, however this fluid does not appear as organized as the collections in the lower neck and upper chest. Inflammation in the right neck extends just superior to the level of the thyroid cartilage. No erosive changes are seen at the right sternoclavicular joint. Upper chest: Reported separately. Other: None. IMPRESSION: Gas and fluid collections in the anterior upper chest as reported on separate chest CT, concerning for abscesses. Phlegmon and less organized gas and fluid extend superiorly in the anterior right neck with suspected myositis of the right sternocleidomastoid muscle. Electronically Signed   By: Sebastian Ache M.D.   On: 06/05/2017 16:31   Ct Chest W Contrast  Result Date: 06/05/2017 CLINICAL DATA:  Patient with shortness of breath. Red raised area anterior lower neck. EXAM: CT CHEST WITH CONTRAST TECHNIQUE: Multidetector CT imaging of the chest was performed during intravenous contrast administration. CONTRAST:  ISOVUE-300 IOPAMIDOL (ISOVUE-300) INJECTION 61% COMPARISON:  Chest CT 06/21/2005; neck CT same day. FINDINGS: Cardiovascular: Normal heart size. No pericardial effusion. Normal caliber thoracic aorta. Enlarged main pulmonary artery (3.3 cm) as can be seen with pulmonary arterial hypertension. Mediastinum/Nodes: 1.2 cm AP window lymph node (image 50; series 2). 7 mm right paratracheal lymph node (image 50; series 2). Normal appearance of the esophagus. No axillary adenopathy. Lungs/Pleura: Central airways are patent. Subpleural consolidation with adjacent tree-in-bud ground-glass nodularity within the right lower lobe (image  114; series 4). Within the right upper lobe there multiple areas of regional ground-glass attenuation. No pleural effusion or pneumothorax. Upper Abdomen: No acute process. Musculoskeletal: There is a large centrally necrotic peripherally enhancing mass within the lower neck which extends from the anterior aspect of the right sternal clavicular joint/distal right sternocleidomastoid superiorly along the right aspect of the thyroid and inferiorly within the retrosternal location. The largest portion of this mass measures 4.7 x 4.1 cm. There are multiple small foci of gas within the mass. No definite osseous destruction demonstrated within the right sternoclavicular  joint. IMPRESSION: 1. There is a large peripherally enhancing mass with central fluid and gas within the right lower neck extending from the anterior aspect of the right sternoclavicular location posteriorly along the retrosternal soft tissues. Findings are concerning for abscess. No definite osseous destruction at this time of the right sternoclavicular joint or adjacent costochondral joint however secondary infection of these joints is not excluded given the size of the suspected abscess. 2. Consolidation within the right lower lobe with right upper lobe ground-glass opacities favored to represent multifocal pneumonia. 3. Mediastinal and right hilar adenopathy likely reactive in etiology. Critical Value/emergent results were called by telephone at the time of interpretation on 06/05/2017 at 4:18 pm to Dr. Fayrene Fearing, who verbally acknowledged these results. Electronically Signed   By: Annia Belt M.D.   On: 06/05/2017 16:29   Mr Hip Left W Wo Contrast  Result Date: 06/06/2017 CLINICAL DATA:  Severe left hip pain for the past few weeks. History of IV drug abuse and recurrent skin and soft tissue infections. EXAM: MRI OF THE LEFT HIP WITHOUT AND WITH CONTRAST TECHNIQUE: Multiplanar, multisequence MR imaging was performed both before and after administration  of intravenous contrast. CONTRAST:  15mL MULTIHANCE GADOBENATE DIMEGLUMINE 529 MG/ML IV SOLN COMPARISON:  None. FINDINGS: Bones: There is no evidence of acute fracture, dislocation or avascular necrosis. The visualized bony pelvis appears normal. The visualized sacroiliac joints and symphysis pubis appear normal. Articular cartilage and labrum Articular cartilage: No focal chondral defect or subchondral signal abnormality identified. Labrum: There is a tear of the left anterior superior labrum. Joint or bursal effusion Joint effusion: Small left hip joint effusion with prominent synovial enhancement. No right hip joint effusion. Bursae: Prominent, rim enhancing fluid within the left iliopsoas bursa, extending superiorly into the left iliacus muscle. The bursa measures up to 2.5 x 3.3 cm in maximal AP by TR dimension. No greater trochanteric bursal fluid. Muscles and tendons Muscles and tendons: Prominent muscle edema and enhancement of the left iliacus muscle. Mild myofascial edema along the left vastus and adductor compartments. The visualized gluteus, hamstring and iliopsoas tendons appear normal. The piriformis muscles appear symmetric. Other findings Miscellaneous: Trace presacral edema. The visualized internal pelvic contents otherwise appear unremarkable. IMPRESSION: 1. Findings consistent with left hip septic arthritis and left iliopsoas septic bursitis. The left iliopsoas bursa extends into the left iliacus muscle, which demonstrates prominent muscle edema and enhancement, consistent with infectious myositis. The preliminary findings were called by telephone at the time of interpretation on 06/06/2017 at 4:00 am to Dr. Marcial Pacas OPYD, by Dr. Elgie Collard. Electronically Signed   By: Obie Dredge M.D.   On: 06/06/2017 08:15   Dg Chest Port 1 View  Result Date: 06/07/2017 CLINICAL DATA:  Shortness of breath, post debridement of RIGHT sternoclavicular abscess EXAM: PORTABLE CHEST 1 VIEW COMPARISON:   Portable exam 1639 hours compared to 06/05/2017 FINDINGS: Normal heart size, mediastinal contours, and pulmonary vascularity. Mild bronchitic changes. Question mild atelectasis versus infiltrate at medial RIGHT lung base. No additional infiltrate, pleural effusion or pneumothorax. No acute osseous findings. IMPRESSION: Mild chronic bronchitic changes with question minimal atelectasis versus infiltrate at medial RIGHT lung base. Electronically Signed   By: Ulyses Southward M.D.   On: 06/07/2017 17:47   Ct Image Guided Drainage By Percutaneous Catheter  Result Date: 06/09/2017 INDICATION: 37 year old male IV drug user with left iliacus abscess. EXAM: CT GUIDED DRAINAGE OF  ABSCESS MEDICATIONS: The patient is currently admitted to the hospital and receiving intravenous antibiotics. The antibiotics  were administered within an appropriate time frame prior to the initiation of the procedure. ANESTHESIA/SEDATION: 4.5 mg IV Versed 200 mcg IV Fentanyl Moderate Sedation Time:  25 minutes The patient was continuously monitored during the procedure by the interventional radiology nurse under my direct supervision. COMPLICATIONS: None immediate. TECHNIQUE: Informed written consent was obtained from the patient after a thorough discussion of the procedural risks, benefits and alternatives. All questions were addressed. Maximal Sterile Barrier Technique was utilized including caps, mask, sterile gowns, sterile gloves, sterile drape, hand hygiene and skin antiseptic. A timeout was performed prior to the initiation of the procedure. PROCEDURE: The operative field was prepped with Chlorhexidine in a sterile fashion, and a sterile drape was applied covering the operative field. A sterile gown and sterile gloves were used for the procedure. Local anesthesia was provided with 1% Lidocaine. A planning axial CT scan was performed. The fluid and gas collection in the left iliac fossa was successfully identified. Local anesthesia was  attained by infiltration with 1% lidocaine. A small dermatotomy was made. Under intermittent CT guidance, an 18 gauge trocar needle was advanced into the fluid collection. A 0.035 Amplatz wire was advanced. The trocar needle was removed. The skin tract was dilated to 12 [place]. A 12 French all-purpose drainage catheter was advanced over the wire and formed. Aspiration yields approximately 30 mL thick purulent fluid. A sample was sent for culture. Catheter was secured to the skin with 0 Prolene suture. A sterile bandage was placed. Post drain placement imaging demonstrates a well-positioned drainage catheter with near-total resolution of the abscess cavity. FINDINGS: Left iliacus abscess. IMPRESSION: Successful placement of a 12 French drainage catheter into the left iliacus abscess. PLAN: Maintain drain to JP bulb suction.  Flush every shift. When drain output is less than 10 mL per day for 2 consecutive days, the drain may be removed. Electronically Signed   By: Malachy Moan M.D.   On: 06/09/2017 18:59    Lab Data:  CBC: Recent Labs  Lab 06/07/17 0210 06/08/17 0541 06/09/17 1237  WBC 18.0* 21.5* 19.4*  HGB 11.2* 11.5* 12.2*  HCT 33.1* 34.6* 36.4*  MCV 89.9 89.9 89.4  PLT 334 398 500*   Basic Metabolic Panel: Recent Labs  Lab 06/09/17 1053 06/10/17 0635 06/11/17 0915 06/12/17 0718 06/13/17 0527  NA 135 135 136 140 135  K 3.8 3.4* 4.0 4.1 3.7  CL 102 102 103 103 100*  CO2 22 22 22 26 24   GLUCOSE 159* 121* 105* 105* 92  BUN 13 10 7 10 10   CREATININE 0.67 0.62 0.56* 0.53* 0.48*  CALCIUM 8.8* 8.5* 8.8* 9.1 9.0   GFR: Estimated Creatinine Clearance: 128.2 mL/min (A) (by C-G formula based on SCr of 0.48 mg/dL (L)). Liver Function Tests: Recent Labs  Lab 06/07/17 0210 06/09/17 1053  AST 41 42*  ALT 39 36  ALKPHOS 185* 165*  BILITOT 0.3 0.6  PROT 5.6* 6.6  ALBUMIN 2.1* 2.6*   No results for input(s): LIPASE, AMYLASE in the last 168 hours. No results for input(s): AMMONIA  in the last 168 hours. Coagulation Profile: Recent Labs  Lab 06/07/17 0210  INR 1.11   Cardiac Enzymes: No results for input(s): CKTOTAL, CKMB, CKMBINDEX, TROPONINI in the last 168 hours. BNP (last 3 results) No results for input(s): PROBNP in the last 8760 hours. HbA1C: No results for input(s): HGBA1C in the last 72 hours. CBG: No results for input(s): GLUCAP in the last 168 hours. Lipid Profile: No results for input(s): CHOL, HDL,  LDLCALC, TRIG, CHOLHDL, LDLDIRECT in the last 72 hours. Thyroid Function Tests: No results for input(s): TSH, T4TOTAL, FREET4, T3FREE, THYROIDAB in the last 72 hours. Anemia Panel: No results for input(s): VITAMINB12, FOLATE, FERRITIN, TIBC, IRON, RETICCTPCT in the last 72 hours. Urine analysis:    Component Value Date/Time   COLORURINE YELLOW 06/06/2017 1333   APPEARANCEUR CLEAR 06/06/2017 1333   LABSPEC 1.010 06/06/2017 1333   PHURINE 6.0 06/06/2017 1333   GLUCOSEU NEGATIVE 06/06/2017 1333   HGBUR NEGATIVE 06/06/2017 1333   BILIRUBINUR NEGATIVE 06/06/2017 1333   KETONESUR NEGATIVE 06/06/2017 1333   PROTEINUR NEGATIVE 06/06/2017 1333   UROBILINOGEN 0.2 10/03/2012 0856   NITRITE NEGATIVE 06/06/2017 1333   LEUKOCYTESUR NEGATIVE 06/06/2017 1333     Clifford Tucker M.D. [facility] 06/13/2017, 12:47 PM  Pager: [phone] Between 7am to 7pm - call Pager - [phone]  After 7pm go to [email or web address] - password [email or web address]  Call night coverage person covering after 7pm

## 2017-06-13 NOTE — Progress Notes (Addendum)
[address]       [place] [address]             [phone]      6 Days Post-Op Procedure(s) (LRB): DEBRIDEMENT RIGHT STERNOCLAVICULAR ABSCESS (Right) APPLICATION OF WOUND VAC (Right) Subjective: More sore after yesterdays VAC change but trying to minimize pain meds  Objective: Vital signs in last 24 hours: Temp:  [98.2 F (36.8 C)-98.5 F (36.9 C)] 98.2 F (36.8 C) (01/22 0452) Pulse Rate:  [75-80] 75 (01/22 0452) Resp:  [16] 16 (01/22 0452) BP: (117-124)/(65-77) 124/77 (01/22 0452) SpO2:  [100 %] 100 % (01/22 0452)  Hemodynamic parameters for last 24 hours:    Intake/Output from previous day: 01/21 0701 - 01/22 0700 In: 410 [P.O.:400] Out: 14 [Drains:14] Intake/Output this shift: No intake/output data recorded.  General appearance: alert, cooperative and no distress Heart: regular rate and rhythm and no murmur Lungs: clear to auscultation bilaterally Wound: cac in place, no cellulitis  Lab Results: No results for input(s): WBC, HGB, HCT, PLT in the last 72 hours. BMET:  Recent Labs    06/12/17 0718 06/13/17 0527  NA 140 135  K 4.1 3.7  CL 103 100*  CO2 26 24  GLUCOSE 105* 92  BUN 10 10  CREATININE 0.53* 0.48*  CALCIUM 9.1 9.0    PT/INR: No results for input(s): LABPROT, INR in the last 72 hours. ABG    Component Value Date/Time   PHART 7.430 06/07/2017 0940   HCO3 27.5 06/07/2017 0940   TCO2 29 06/05/2017 1457   ACIDBASEDEF 4.0 (H) 03/13/2007 0120   O2SAT 90.2 06/07/2017 0940   CBG (last 3)  No results for input(s): GLUCAP in the last 72 hours.  Meds Scheduled Meds: . enoxaparin (LOVENOX) injection  40 mg Subcutaneous Q24H  . iopamidol  20 mL Intra-articular Once  . lidocaine (PF)  5 mL Intradermal Once  . nicotine  21 mg Transdermal Daily  . pantoprazole  40 mg Oral Q0600  . sodium chloride  10 mL Intravenous Once  . sodium chloride flush  5 mL Intravenous Q8H   Continuous Infusions: . sodium chloride    .   ceFAZolin (ANCEF) IV Stopped (06/13/17 0259)  . dextrose 5 % and 0.45% NaCl 30 mL/hr at 06/07/17 2000  . lactated ringers 10 mL/hr at 06/06/17 1406  . lactated ringers 10 mL/hr at 06/07/17 1354  . potassium chloride      Results for orders placed or performed during the hospital encounter of 06/05/17  Blood culture (routine x 2)     Status: Abnormal   Collection Time: 06/05/17  2:44 PM  Result Value Ref Range Status   Specimen Description RIGHT ANTECUBITAL  Final   Special Requests   Final    BOTTLES DRAWN AEROBIC ONLY Blood Culture adequate volume   Culture  Setup Time   Final    GRAM POSITIVE COCCI Gram Stain Report Called to,Read Back By and Verified With: ROWE,C AT 0730 BY HUFFINES,S ON 06/06/17. AEROBIC BOTTLE ONLY    Culture (A)  Final    STAPHYLOCOCCUS AUREUS SUSCEPTIBILITIES PERFORMED ON PREVIOUS CULTURE WITHIN THE LAST 5 DAYS. Performed at [facility], [address] [place], [place] [address]    Report Status 06/08/2017 FINAL  Final  Blood culture (routine x 2)     Status: Abnormal   Collection Time: 06/05/17  2:47 PM  Result Value Ref Range Status   Specimen Description LEFT ANTECUBITAL  Final  Special Requests   Final    BOTTLES DRAWN AEROBIC AND ANAEROBIC Blood Culture adequate volume   Culture  Setup Time   Final    GRAM POSITIVE COCCI BOTH AEB AND ANA Gram Stain Report Called to,Read Back By and Verified With: J.CRUISE, RN @ [facility] ED @ 0545 ON 1.15.19 BY BOWMAN,L CRITICAL RESULT CALLED TO, READ BACK BY AND VERIFIED WITH: N. Batchelder Pharm.D. 8:55 06/06/17 ([email or web address]) Performed at [facility], [address], [place], [place] [address]    Culture STAPHYLOCOCCUS AUREUS (A)  Final   Report Status 06/08/2017 FINAL  Final   Organism ID, Bacteria STAPHYLOCOCCUS AUREUS  Final      Susceptibility   Staphylococcus aureus - MIC*    CIPROFLOXACIN <=0.5 SENSITIVE Sensitive     ERYTHROMYCIN <=0.25 SENSITIVE Sensitive     GENTAMICIN <=0.5 SENSITIVE Sensitive      OXACILLIN <=0.25 SENSITIVE Sensitive     TETRACYCLINE <=1 SENSITIVE Sensitive     VANCOMYCIN <=0.5 SENSITIVE Sensitive     TRIMETH/SULFA <=10 SENSITIVE Sensitive     CLINDAMYCIN <=0.25 SENSITIVE Sensitive     RIFAMPIN <=0.5 SENSITIVE Sensitive     Inducible Clindamycin NEGATIVE Sensitive     * STAPHYLOCOCCUS AUREUS  Blood Culture ID Panel (Reflexed)     Status: Abnormal   Collection Time: 06/05/17  2:47 PM  Result Value Ref Range Status   Enterococcus species NOT DETECTED NOT DETECTED Final   Listeria monocytogenes NOT DETECTED NOT DETECTED Final   Staphylococcus species DETECTED (A) NOT DETECTED Final    Comment: CRITICAL RESULT CALLED TO, READ BACK BY AND VERIFIED WITH: N. Batchelder Pharm.D. 8:55 06/06/17 ([email or web address])    Staphylococcus aureus DETECTED (A) NOT DETECTED Final    Comment: Methicillin (oxacillin) susceptible Staphylococcus aureus (MSSA). Preferred therapy is anti staphylococcal beta lactam antibiotic (Cefazolin or Nafcillin), unless clinically contraindicated. CRITICAL RESULT CALLED TO, READ BACK BY AND VERIFIED WITH: N. Batchelder Pharm.D. 8:55 06/06/17 (wilsonm)    Methicillin resistance NOT DETECTED NOT DETECTED Final   Streptococcus species NOT DETECTED NOT DETECTED Final   Streptococcus agalactiae NOT DETECTED NOT DETECTED Final   Streptococcus pneumoniae NOT DETECTED NOT DETECTED Final   Streptococcus pyogenes NOT DETECTED NOT DETECTED Final   Acinetobacter baumannii NOT DETECTED NOT DETECTED Final   Enterobacteriaceae species NOT DETECTED NOT DETECTED Final   Enterobacter cloacae complex NOT DETECTED NOT DETECTED Final   Escherichia coli NOT DETECTED NOT DETECTED Final   Klebsiella oxytoca NOT DETECTED NOT DETECTED Final   Klebsiella pneumoniae NOT DETECTED NOT DETECTED Final   Proteus species NOT DETECTED NOT DETECTED Final   Serratia marcescens NOT DETECTED NOT DETECTED Final   Haemophilus influenzae NOT DETECTED NOT DETECTED Final   Neisseria meningitidis  NOT DETECTED NOT DETECTED Final   Pseudomonas aeruginosa NOT DETECTED NOT DETECTED Final   Candida albicans NOT DETECTED NOT DETECTED Final   Candida glabrata NOT DETECTED NOT DETECTED Final   Candida krusei NOT DETECTED NOT DETECTED Final   Candida parapsilosis NOT DETECTED NOT DETECTED Final   Candida tropicalis NOT DETECTED NOT DETECTED Final  Culture, blood (routine x 2)     Status: None   Collection Time: 06/06/17  1:45 PM  Result Value Ref Range Status   Specimen Description BLOOD RIGHT ANTECUBITAL  Final   Special Requests   Final    BOTTLES DRAWN AEROBIC AND ANAEROBIC Blood Culture adequate volume   Culture NO GROWTH 5 DAYS  Final   Report Status 06/11/2017 FINAL  Final  Culture, blood (  routine x 2)     Status: None   Collection Time: 06/06/17  1:54 PM  Result Value Ref Range Status   Specimen Description BLOOD LEFT ANTECUBITAL  Final   Special Requests   Final    BOTTLES DRAWN AEROBIC AND ANAEROBIC Blood Culture adequate volume   Culture NO GROWTH 5 DAYS  Final   Report Status 06/11/2017 FINAL  Final  Aerobic/Anaerobic Culture (surgical/deep wound)     Status: None   Collection Time: 06/06/17  4:09 PM  Result Value Ref Range Status   Specimen Description WOUND LEFT HIP  Final   Special Requests PT ON VANC ANCEF ROCEPHIN ZOSYN  Final   Gram Stain   Final    NO WBC SEEN NO SQUAMOUS EPITHELIAL CELLS SEEN NO ORGANISMS SEEN    Culture   Final    RARE STAPHYLOCOCCUS AUREUS NO ANAEROBES ISOLATED    Report Status 06/11/2017 FINAL  Final   Organism ID, Bacteria STAPHYLOCOCCUS AUREUS  Final      Susceptibility   Staphylococcus aureus - MIC*    CIPROFLOXACIN <=0.5 SENSITIVE Sensitive     ERYTHROMYCIN <=0.25 SENSITIVE Sensitive     GENTAMICIN <=0.5 SENSITIVE Sensitive     OXACILLIN 0.5 SENSITIVE Sensitive     TETRACYCLINE <=1 SENSITIVE Sensitive     VANCOMYCIN <=0.5 SENSITIVE Sensitive     TRIMETH/SULFA <=10 SENSITIVE Sensitive     CLINDAMYCIN <=0.25 SENSITIVE Sensitive       RIFAMPIN <=0.5 SENSITIVE Sensitive     Inducible Clindamycin NEGATIVE Sensitive     * RARE STAPHYLOCOCCUS AUREUS  Surgical pcr screen     Status: Abnormal   Collection Time: 06/06/17  8:38 PM  Result Value Ref Range Status   MRSA, PCR NEGATIVE NEGATIVE Final   Staphylococcus aureus POSITIVE (A) NEGATIVE Final    Comment: (NOTE) The Xpert SA Assay (FDA approved for NASAL specimens in patients 86 years of age and older), is one component of a comprehensive surveillance program. It is not intended to diagnose infection nor to guide or monitor treatment.   Culture, sputum-assessment     Status: None   Collection Time: 06/07/17  9:14 AM  Result Value Ref Range Status   Specimen Description SPUTUM  Final   Special Requests NONE  Final   Sputum evaluation THIS SPECIMEN IS ACCEPTABLE FOR SPUTUM CULTURE  Final   Report Status 06/07/2017 FINAL  Final  Culture, respiratory (NON-Expectorated)     Status: None   Collection Time: 06/07/17  9:14 AM  Result Value Ref Range Status   Specimen Description SPUTUM  Final   Special Requests NONE Reflexed from [id number]  Final   Gram Stain   Final    FEW WBC PRESENT, PREDOMINANTLY PMN NO ORGANISMS SEEN    Culture MODERATE CANDIDA TROPICALIS  Final   Report Status 06/09/2017 FINAL  Final  Aerobic/Anaerobic Culture (surgical/deep wound)     Status: None   Collection Time: 06/07/17  3:32 PM  Result Value Ref Range Status   Specimen Description WOUND  Final   Special Requests CHEST WALL ABSCESS  Final   Gram Stain   Final    ABUNDANT WBC PRESENT, PREDOMINANTLY PMN MODERATE GRAM POSITIVE COCCI    Culture   Final    MODERATE STAPHYLOCOCCUS AUREUS NO ANAEROBES ISOLATED    Report Status 06/12/2017 FINAL  Final   Organism ID, Bacteria STAPHYLOCOCCUS AUREUS  Final      Susceptibility   Staphylococcus aureus - MIC*    CIPROFLOXACIN <=0.5 SENSITIVE Sensitive  ERYTHROMYCIN <=0.25 SENSITIVE Sensitive     GENTAMICIN <=0.5 SENSITIVE Sensitive      OXACILLIN 0.5 SENSITIVE Sensitive     TETRACYCLINE <=1 SENSITIVE Sensitive     VANCOMYCIN 1 SENSITIVE Sensitive     TRIMETH/SULFA <=10 SENSITIVE Sensitive     CLINDAMYCIN <=0.25 SENSITIVE Sensitive     RIFAMPIN <=0.5 SENSITIVE Sensitive     Inducible Clindamycin NEGATIVE Sensitive     * MODERATE STAPHYLOCOCCUS AUREUS  Aerobic/Anaerobic Culture (surgical/deep wound)     Status: None (Preliminary result)   Collection Time: 06/09/17  6:24 PM  Result Value Ref Range Status   Specimen Description ABSCESS LEFT ILIACUS  Final   Special Requests NONE  Final   Gram Stain   Final    ABUNDANT WBC PRESENT, PREDOMINANTLY PMN NO ORGANISMS SEEN    Culture   Final    FEW STAPHYLOCOCCUS AUREUS SUSCEPTIBILITIES PERFORMED ON PREVIOUS CULTURE WITHIN THE LAST 5 DAYS. NO ANAEROBES ISOLATED; CULTURE IN PROGRESS FOR 5 DAYS    Report Status PENDING  Incomplete     PRN Meds:.acetaminophen **OR** acetaminophen, alum & mag hydroxide-simeth, diphenhydrAMINE, fentaNYL (SUBLIMAZE) injection, HYDROmorphone (DILAUDID) injection, metoCLOPramide **OR** metoCLOPramide (REGLAN) injection, ondansetron **OR** ondansetron (ZOFRAN) IV, oxyCODONE, potassium chloride, senna-docusate, zolpidem  Xrays No results found.  Assessment/Plan: S/P Procedure(s) (LRB): DEBRIDEMENT RIGHT STERNOCLAVICULAR ABSCESS (Right) APPLICATION OF WOUND VAC (Right)  1 doing well, conts current VAC change schedule and ABX per ID recs    LOS: 8 days    [name] 06/13/2017   patient examined and medical record reviewed,agree with above note. [name] 06/13/2017

## 2017-06-13 NOTE — Progress Notes (Signed)
[facility]  Date of Admission:  06/05/2017     Reason for Visit: Follow up on MSSA bacteremia, septic arthritis of sternoclavicular joint, neck abscesses, left iliopsoas abscess.   Total days of antibiotics 9  Cefazolin day 8        Patient ID: Clifford Tucker is a 37 y.o. 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 He has some more soreness after wound vac was changed yesterday.   . enoxaparin (LOVENOX) injection  40 mg Subcutaneous Q24H  . iopamidol  20 mL Intra-articular Once  . lidocaine (PF)  5 mL Intradermal Once  . nicotine  21 mg Transdermal Daily  . pantoprazole  40 mg Oral Q0600  . sodium chloride  10 mL Intravenous Once  . sodium chloride flush  5 mL Intravenous Q8H   No Known Allergies  OBJECTIVE: Vitals:   06/12/17 0620 06/12/17 1344 06/12/17 2135 06/13/17 0452  BP: 120/68 119/74 117/65 124/77  Pulse: 71 80 78 75  Resp: 16 16 16 16   Temp: 98.8 F (37.1 C) 98.5 F (36.9 C) 98.3 F (36.8 C) 98.2 F (36.8 C)  TempSrc: Oral Oral Oral Oral  SpO2: 99% 100% 100% 100%  Weight:      Height:       Body mass index is 22.24 kg/m.  Physical Exam  Constitutional: He is oriented to person, place, and time and well-developed, well-nourished, and in no distress.  HENT:  Mouth/Throat: No oral lesions. Normal dentition. No dental caries.  Eyes: No scleral icterus.  Cardiovascular: Normal rate, regular rhythm and normal heart sounds.  Pulmonary/Chest: Effort normal and breath sounds normal.  Wound vac engaged. Site clean and dry.   Abdominal: Soft.  He exhibits no distension. There is no tenderness.  Musculoskeletal:  Drain to left hip/groin remains. Thin opaque drainage.   Lymphadenopathy:    He has no cervical adenopathy.  Neurological: He is alert and oriented to person, place, and time.  Skin: Skin is warm and dry. No rash noted.  Psychiatric: Mood and affect normal.   Lab Results Lab Results  Component Value Date   WBC 19.4 (H) 06/09/2017   HGB 12.2 (L) 06/09/2017   HCT 36.4 (L) 06/09/2017   MCV 89.4 06/09/2017   PLT 500 (H) 06/09/2017    Lab Results  Component Value Date   CREATININE 0.48 (L) 06/13/2017   BUN 10 06/13/2017   NA 135 06/13/2017   K 3.7 06/13/2017   CL 100 (L) 06/13/2017   CO2 24 06/13/2017    Lab Results  Component Value Date   ALT 36 06/09/2017   AST 42 (H) 06/09/2017   ALKPHOS 165 (H) 06/09/2017   BILITOT 0.6 06/09/2017     Microbiology: Recent Results (from the past 240 hour(s))  Blood culture (routine x 2)     Status: Abnormal   Collection Time: 06/05/17  2:44 PM  Result Value Ref Range Status   Specimen Description RIGHT ANTECUBITAL  Final   Special Requests   Final    BOTTLES DRAWN  AEROBIC ONLY Blood Culture adequate volume   Culture  Setup Time   Final    GRAM POSITIVE COCCI Gram Stain Report Called to,Read Back By and Verified With: ROWE,C AT 0730 BY HUFFINES,S ON 06/06/17. AEROBIC BOTTLE ONLY    Culture (A)  Final    STAPHYLOCOCCUS AUREUS SUSCEPTIBILITIES PERFORMED ON PREVIOUS CULTURE WITHIN THE LAST 5 DAYS. Performed at [facility], [address] [place], [place] [address]    Report Status 06/08/2017 FINAL  Final  Blood culture (routine x 2)     Status: Abnormal   Collection Time: 06/05/17  2:47 PM  Result Value Ref Range Status   Specimen Description LEFT ANTECUBITAL  Final   Special Requests   Final    BOTTLES DRAWN AEROBIC AND ANAEROBIC Blood Culture adequate volume   Culture  Setup Time   Final    GRAM POSITIVE COCCI BOTH AEB AND ANA Gram Stain Report  Called to,Read Back By and Verified With: J.CRUISE, RN @ [facility] @ 0545 ON 1.15.19 BY BOWMAN,L CRITICAL RESULT CALLED TO, READ BACK BY AND VERIFIED WITH: N. Batchelder Pharm.D. 8:55 06/06/17 ([email or web address]) Performed at [facility], [address] [place], [place] [address]    Culture STAPHYLOCOCCUS AUREUS (A)  Final   Report Status 06/08/2017 FINAL  Final   Organism ID, Bacteria STAPHYLOCOCCUS AUREUS  Final      Susceptibility   Staphylococcus aureus - MIC*    CIPROFLOXACIN <=0.5 SENSITIVE Sensitive     ERYTHROMYCIN <=0.25 SENSITIVE Sensitive     GENTAMICIN <=0.5 SENSITIVE Sensitive     OXACILLIN <=0.25 SENSITIVE Sensitive     TETRACYCLINE <=1 SENSITIVE Sensitive     VANCOMYCIN <=0.5 SENSITIVE Sensitive     TRIMETH/SULFA <=10 SENSITIVE Sensitive     CLINDAMYCIN <=0.25 SENSITIVE Sensitive     RIFAMPIN <=0.5 SENSITIVE Sensitive     Inducible Clindamycin NEGATIVE Sensitive     * STAPHYLOCOCCUS AUREUS  Blood Culture ID Panel (Reflexed)     Status: Abnormal   Collection Time: 06/05/17  2:47 PM  Result Value Ref Range Status   Enterococcus species NOT DETECTED NOT DETECTED Final   Listeria monocytogenes NOT DETECTED NOT DETECTED Final   Staphylococcus species DETECTED (A) NOT DETECTED Final    Comment: CRITICAL RESULT CALLED TO, READ BACK BY AND VERIFIED WITH: N. Batchelder Pharm.D. 8:55 06/06/17 (wilsonm)    Staphylococcus aureus DETECTED (A) NOT DETECTED Final    Comment: Methicillin (oxacillin) susceptible Staphylococcus aureus (MSSA). Preferred therapy is anti staphylococcal beta lactam antibiotic (Cefazolin or Nafcillin), unless clinically contraindicated. CRITICAL RESULT CALLED TO, READ BACK BY AND VERIFIED WITH: N. Batchelder Pharm.D. 8:55 06/06/17 (wilsonm)    Methicillin resistance NOT DETECTED NOT DETECTED Final   Streptococcus species NOT DETECTED NOT DETECTED Final   Streptococcus agalactiae NOT DETECTED NOT DETECTED Final   Streptococcus pneumoniae NOT DETECTED NOT DETECTED  Final   Streptococcus pyogenes NOT DETECTED NOT DETECTED Final   Acinetobacter baumannii NOT DETECTED NOT DETECTED Final   Enterobacteriaceae species NOT DETECTED NOT DETECTED Final   Enterobacter cloacae complex NOT DETECTED NOT DETECTED Final   Escherichia coli NOT DETECTED NOT DETECTED Final   Klebsiella oxytoca NOT DETECTED NOT DETECTED Final   Klebsiella pneumoniae NOT DETECTED NOT DETECTED Final   Proteus species NOT DETECTED NOT DETECTED Final   Serratia marcescens NOT DETECTED NOT DETECTED Final   Haemophilus influenzae NOT DETECTED NOT DETECTED Final   Neisseria meningitidis NOT DETECTED NOT DETECTED Final   Pseudomonas aeruginosa NOT DETECTED NOT  DETECTED Final   Candida albicans NOT DETECTED NOT DETECTED Final   Candida glabrata NOT DETECTED NOT DETECTED Final   Candida krusei NOT DETECTED NOT DETECTED Final   Candida parapsilosis NOT DETECTED NOT DETECTED Final   Candida tropicalis NOT DETECTED NOT DETECTED Final  Culture, blood (routine x 2)     Status: None   Collection Time: 06/06/17  1:45 PM  Result Value Ref Range Status   Specimen Description BLOOD RIGHT ANTECUBITAL  Final   Special Requests   Final    BOTTLES DRAWN AEROBIC AND ANAEROBIC Blood Culture adequate volume   Culture NO GROWTH 5 DAYS  Final   Report Status 06/11/2017 FINAL  Final  Culture, blood (routine x 2)     Status: None   Collection Time: 06/06/17  1:54 PM  Result Value Ref Range Status   Specimen Description BLOOD LEFT ANTECUBITAL  Final   Special Requests   Final    BOTTLES DRAWN AEROBIC AND ANAEROBIC Blood Culture adequate volume   Culture NO GROWTH 5 DAYS  Final   Report Status 06/11/2017 FINAL  Final  Aerobic/Anaerobic Culture (surgical/deep wound)     Status: None   Collection Time: 06/06/17  4:09 PM  Result Value Ref Range Status   Specimen Description WOUND LEFT HIP  Final   Special Requests PT ON VANC ANCEF ROCEPHIN ZOSYN  Final   Gram Stain   Final    NO WBC SEEN NO SQUAMOUS  EPITHELIAL CELLS SEEN NO ORGANISMS SEEN    Culture   Final    RARE STAPHYLOCOCCUS AUREUS NO ANAEROBES ISOLATED    Report Status 06/11/2017 FINAL  Final   Organism ID, Bacteria STAPHYLOCOCCUS AUREUS  Final      Susceptibility   Staphylococcus aureus - MIC*    CIPROFLOXACIN <=0.5 SENSITIVE Sensitive     ERYTHROMYCIN <=0.25 SENSITIVE Sensitive     GENTAMICIN <=0.5 SENSITIVE Sensitive     OXACILLIN 0.5 SENSITIVE Sensitive     TETRACYCLINE <=1 SENSITIVE Sensitive     VANCOMYCIN <=0.5 SENSITIVE Sensitive     TRIMETH/SULFA <=10 SENSITIVE Sensitive     CLINDAMYCIN <=0.25 SENSITIVE Sensitive     RIFAMPIN <=0.5 SENSITIVE Sensitive     Inducible Clindamycin NEGATIVE Sensitive     * RARE STAPHYLOCOCCUS AUREUS  Surgical pcr screen     Status: Abnormal   Collection Time: 06/06/17  8:38 PM  Result Value Ref Range Status   MRSA, PCR NEGATIVE NEGATIVE Final   Staphylococcus aureus POSITIVE (A) NEGATIVE Final    Comment: (NOTE) The Xpert SA Assay (FDA approved for NASAL specimens in patients 23 years of age and older), is one component of a comprehensive surveillance program. It is not intended to diagnose infection nor to guide or monitor treatment.   Culture, sputum-assessment     Status: None   Collection Time: 06/07/17  9:14 AM  Result Value Ref Range Status   Specimen Description SPUTUM  Final   Special Requests NONE  Final   Sputum evaluation THIS SPECIMEN IS ACCEPTABLE FOR SPUTUM CULTURE  Final   Report Status 06/07/2017 FINAL  Final  Culture, respiratory (NON-Expectorated)     Status: None   Collection Time: 06/07/17  9:14 AM  Result Value Ref Range Status   Specimen Description SPUTUM  Final   Special Requests NONE Reflexed from [id number]  Final   Gram Stain   Final    FEW WBC PRESENT, PREDOMINANTLY PMN NO ORGANISMS SEEN    Culture MODERATE CANDIDA TROPICALIS  Final  Report Status 06/09/2017 FINAL  Final  Aerobic/Anaerobic Culture (surgical/deep wound)     Status: None    Collection Time: 06/07/17  3:32 PM  Result Value Ref Range Status   Specimen Description WOUND  Final   Special Requests CHEST WALL ABSCESS  Final   Gram Stain   Final    ABUNDANT WBC PRESENT, PREDOMINANTLY PMN MODERATE GRAM POSITIVE COCCI    Culture   Final    MODERATE STAPHYLOCOCCUS AUREUS NO ANAEROBES ISOLATED    Report Status 06/12/2017 FINAL  Final   Organism ID, Bacteria STAPHYLOCOCCUS AUREUS  Final      Susceptibility   Staphylococcus aureus - MIC*    CIPROFLOXACIN <=0.5 SENSITIVE Sensitive     ERYTHROMYCIN <=0.25 SENSITIVE Sensitive     GENTAMICIN <=0.5 SENSITIVE Sensitive     OXACILLIN 0.5 SENSITIVE Sensitive     TETRACYCLINE <=1 SENSITIVE Sensitive     VANCOMYCIN 1 SENSITIVE Sensitive     TRIMETH/SULFA <=10 SENSITIVE Sensitive     CLINDAMYCIN <=0.25 SENSITIVE Sensitive     RIFAMPIN <=0.5 SENSITIVE Sensitive     Inducible Clindamycin NEGATIVE Sensitive     * MODERATE STAPHYLOCOCCUS AUREUS  Aerobic/Anaerobic Culture (surgical/deep wound)     Status: None (Preliminary result)   Collection Time: 06/09/17  6:24 PM  Result Value Ref Range Status   Specimen Description ABSCESS LEFT ILIACUS  Final   Special Requests NONE  Final   Gram Stain   Final    ABUNDANT WBC PRESENT, PREDOMINANTLY PMN NO ORGANISMS SEEN    Culture   Final    FEW STAPHYLOCOCCUS AUREUS SUSCEPTIBILITIES PERFORMED ON PREVIOUS CULTURE WITHIN THE LAST 5 DAYS. NO ANAEROBES ISOLATED; CULTURE IN PROGRESS FOR 5 DAYS    Report Status PENDING  Incomplete   ASSESSMENT: MSSA bacteremia - TTE without vegetation  Sternoclavicular joint septic arthritis - s/p debridement 06/07/17 Septic arthritis of left hip - s/p debridement 06/06/17 Left Iliopsoas abscess - 06/09/17 perc drain  IVDU  In discussion with LCSW today it appears we will less likely be able to secure SNF placement considering his recent drug use and risk. While it would be ideal to provide him with 6 weeks of IV Ancef, I am not certain this will be  realistic considering he is not a candidate for home therapies and SNF may not accept him.   PLAN: 1. Continue Ancef with hopes for placement and 6 weeks of treatment.  2. If this is not possible he really needs at least 2 weeks of IV therapy considering he was bacteremic with multiple areas of metastatic infection through February 1st with transition to oral regimen that offers good bone penetration like linezolid. Bactrim +/- Rifampin would be an option as well with close follow up.  3. Would be helpful to have Dr. Criselda Peaches or Dr. Oswaldo Done to discuss potential suboxone therapy while inpatient with goal to transition to outpatient management. His current opioid dose may be weaned down a bit to help get him back into care for his opioid use disorder and prevent further harm.   Rexene Alberts, MSN, NP-C [facility] Cell: [phone] Pager: [phone]  06/13/2017  1:43 PM

## 2017-06-13 NOTE — Social Work (Addendum)
CSW continuing to search for placement for pt to complete IV abx, this is complicated by pt drug hx. CSW updated ID NP/[facility] with this information and will update care team with any new updates regarding potential placement.   CSW continuing to follow.   Doy HutchingIsabel H Hopelynn Tucker, LCSWA [facility] Clinical Social Work [phone]

## 2017-06-14 ENCOUNTER — Inpatient Hospital Stay (HOSPITAL_COMMUNITY): Payer: Self-pay

## 2017-06-14 LAB — BASIC METABOLIC PANEL
Anion gap: 11 (ref 5–15)
BUN: 8 mg/dL (ref 6–20)
CO2: 25 mmol/L (ref 22–32)
Calcium: 9.3 mg/dL (ref 8.9–10.3)
Chloride: 100 mmol/L — ABNORMAL LOW (ref 101–111)
Creatinine, Ser: 0.62 mg/dL (ref 0.61–1.24)
GFR calc Af Amer: >60 mL/min (ref >60)
GFR calc non Af Amer: >60 mL/min (ref >60)
Glucose, Bld: 96 mg/dL (ref 65–99)
Potassium: 4 mmol/L (ref 3.5–5.1)
Sodium: 136 mmol/L (ref 135–145)

## 2017-06-14 MED ORDER — IOPAMIDOL (ISOVUE-300) INJECTION 61%
INTRAVENOUS | Status: AC
  Administered 2017-06-14: 75 mL via INTRAVENOUS
  Filled 2017-06-14: qty 75

## 2017-06-14 NOTE — Progress Notes (Signed)
Drain removed with no difficulties after CT scan showed no residual fluid collection.  Patient tolerated the procedure well.  Clean and dry dressing placed.  Letha CapeKelly E Crystle Carelli 3:01 PM 06/14/2017

## 2017-06-14 NOTE — Progress Notes (Signed)
[facility]                                                                              Patient Demographics  Clifford Tucker, is a 37 y.o. male, DOB - 1981-05-12, UEA:[id number]  Admit date - 06/05/2017   Admitting Physician Briscoe Deutscher, MD  Outpatient Primary MD for the patient is Patient, No Pcp Per  Outpatient specialists:   LOS - 9  days   Medical records reviewed and are as summarized below:    Chief Complaint  Patient presents with  . Oral Swelling       Brief summary   Clifford Tucker is a 37 y.o. malewith medical history significant forIV drug abuse and recurrent skin and soft tissue infections. He presented with neck/hip pain found to have a neck abscess, septic arthritis of the hip and now MSSA bacteremia. CT surgery, orthopedic surgery and infectious disease consulted.   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  Reported having history of multiple abscesses over the last few weeks -Patient was originally placed on vancomycin and Zosyn, transitioned to IV Ancef -Sternal aspirate showed MSSA, left hip wound cultures also MSSA -2D echo with no vegetation.  ID following, recommended continue cefazolin - Continue IV Ancef for 6 weeks through March 1st.  - SW working on SNF placement, difficult placement due to drug use.   Active Problems:   Multifocal pneumonia -ID following, continue cefazolin  Left hip abscess/iliopsoas septic arthritis, infectious myositis -Status post left hip I&D with cultures done on 1/15, showing staph aureus -Continue Ancef for 6 weeks through March 1st -Status post drain placement 1/18 for iliacus abscess, IR following, plan for CT abdomen and pelvis today to determine if the drain needs to remain    -Doppler ultrasound of the lower extremity, left negative for any DVT  Septic arthritis of the sternoclavicular joint, abscess -CTVS following, underwent debridement with wound VAC of the neck abscess - Continue current VAC change schedule and antibiotics  Right shoulder pain - Patient reports that he had been having smoldering pain in the right shoulder for last 3 days and now progressively getting worse. Range of movement normal, ordered CT of the right shoulder to rule out septic arthritis, any infectious abscess given his history    IV drug user -Per patient, has been using heroin, [facility] but relapsed again in a month before the admission -Last heroin use a week ago PTA, so far not in any withdrawals -Social work consulted for rehab/SNF placement   Nicotine abuse -Nicotine patch placed   Code Status: Full code DVT Prophylaxis: Lovenox Family Communication: Discussed in detail with the patient, all imaging results, lab results explained to the patient and girlfriend at the bedside   Disposition Plan: Social work assisting on skilled nursing facility  Time Spent in minutes   25 minutes  Procedures:  I&D left hip 1/15 I&D of  the sternal abscess Perc drain for left iliacus abscess  Consultants:   Orthopedics CT VS ID IR  Antimicrobials:      Medications  Scheduled Meds: . enoxaparin (LOVENOX) injection  40 mg Subcutaneous Q24H  . iopamidol      . iopamidol  20 mL Intra-articular Once  . lidocaine (PF)  5 mL Intradermal Once  . nicotine  21 mg Transdermal Daily  . pantoprazole  40 mg Oral Q0600  . sodium chloride  10 mL Intravenous Once  . sodium chloride flush  5 mL Intravenous Q8H   Continuous Infusions: . sodium chloride    .  ceFAZolin (ANCEF) IV 2 g (06/14/17 0943)  . dextrose 5 % and 0.45% NaCl 30 mL/hr at 06/07/17 2000  . lactated ringers 10 mL/hr at 06/06/17 1406  . lactated ringers 10 mL/hr at 06/07/17 1354  . potassium  chloride     PRN Meds:.acetaminophen **OR** acetaminophen, alum & mag hydroxide-simeth, diphenhydrAMINE, HYDROmorphone (DILAUDID) injection, metoCLOPramide **OR** metoCLOPramide (REGLAN) injection, ondansetron **OR** ondansetron (ZOFRAN) IV, oxyCODONE, potassium chloride, senna-docusate, zolpidem   Antibiotics   Anti-infectives (From admission, onward)   Start     Dose/Rate Route Frequency Ordered Stop   06/07/17 1400  cefUROXime (ZINACEF) 1.5 g in dextrose 5 % 50 mL IVPB     1.5 g 100 mL/hr over 30 Minutes Intravenous To ShortStay Surgical 06/07/17 0125 06/07/17 1527   06/07/17 0745  vancomycin (VANCOCIN) 1,000 mg in sodium chloride 0.9 % 1,000 mL irrigation      Irrigation To Surgery 06/07/17 0744 06/07/17 1513   06/06/17 1639  gentamicin (GARAMYCIN) injection  Status:  Discontinued       As needed 06/06/17 1640 06/06/17 1715   06/06/17 1638  vancomycin (VANCOCIN) powder  Status:  Discontinued       As needed 06/06/17 1639 06/06/17 1715   06/06/17 1000  ceFAZolin (ANCEF) IVPB 2g/100 mL premix     2 g 200 mL/hr over 30 Minutes Intravenous Every 8 hours 06/06/17 0911     06/06/17 0000  vancomycin (VANCOCIN) IVPB 1000 mg/200 mL premix  Status:  Discontinued     1,000 mg 200 mL/hr over 60 Minutes Intravenous Every 8 hours 06/05/17 2211 06/06/17 0911   06/05/17 2330  piperacillin-tazobactam (ZOSYN) IVPB 3.375 g  Status:  Discontinued     3.375 g 12.5 mL/hr over 240 Minutes Intravenous Every 8 hours 06/05/17 2211 06/06/17 0911   06/05/17 2200  cefTRIAXone (ROCEPHIN) 1 g in dextrose 5 % 50 mL IVPB  Status:  Discontinued     1 g 100 mL/hr over 30 Minutes Intravenous Every 24 hours 06/05/17 2140 06/05/17 2202   06/05/17 2200  azithromycin (ZITHROMAX) 500 mg in dextrose 5 % 250 mL IVPB  Status:  Discontinued     500 mg 250 mL/hr over 60 Minutes Intravenous Every 24 hours 06/05/17 2140 06/06/17 0911   06/05/17 1600  vancomycin (VANCOCIN) 1,500 mg in sodium chloride 0.9 % 500 mL IVPB      1,500 mg 250 mL/hr over 120 Minutes Intravenous  Once 06/05/17 1542 06/05/17 1855   06/05/17 1515  piperacillin-tazobactam (ZOSYN) IVPB 3.375 g     3.375 g 100 mL/hr over 30 Minutes Intravenous  Once 06/05/17 1512 06/05/17 1557        Subjective:   Clifford Tucker was seen and examined today. States pain is better controlled, had a BM yesterday, slept well. However he's been having right shoulder pain for the last 3 days progressively worsening.  Patient  denies dizziness, chest pain, shortness of breath, abdominal pain, N/V/D/C, new weakness, numbess, tingling.    Objective:   Vitals:   06/13/17 0452 06/13/17 1400 06/13/17 2057 06/14/17 0451  BP: 124/77 107/68 113/61 126/77  Pulse: 75 84 75 79  Resp: 16 16 16 16   Temp: 98.2 F (36.8 C) 98 F (36.7 C) 98.6 F (37 C) 98.9 F (37.2 C)  TempSrc: Oral Oral Oral Oral  SpO2: 100% 97% 99% 97%  Weight:      Height:        Intake/Output Summary (Last 24 hours) at 06/14/2017 1220 Last data filed at 06/13/2017 1401 Gross per 24 hour  Intake 200 ml  Output -  Net 200 ml     Wt Readings from Last 3 Encounters:  06/06/17 70.3 kg (154 lb 15.7 oz)  10/03/12 72.6 kg (160 lb)     Exam   General: Alert and oriented x 3, NAD  Eyes:   HEENT:  Wound vac  Cardiovascular: S1 S2 clear, RRR, no pedal edema bilaterally   Respiratory: Clear to auscultation bilaterally, no wheezing, rales or rhonchi  Gastrointestinal: Soft, nontender, nondistended, + bowel sounds, + drain  Ext: no pedal edema bilaterally, range of movements normal on the right shoulder, patient reporting pain  Neuro: no neuro deficits  Musculoskeletal: No digital cyanosis, clubbing  Skin: No rashes  Psych: Normal affect and demeanor, alert and oriented x3    Data Reviewed:  I have personally reviewed following labs and imaging studies  Micro Results Recent Results (from the past 240 hour(s))  Blood culture (routine x 2)     Status: Abnormal    Collection Time: 06/05/17  2:44 PM  Result Value Ref Range Status   Specimen Description RIGHT ANTECUBITAL  Final   Special Requests   Final    BOTTLES DRAWN AEROBIC ONLY Blood Culture adequate volume   Culture  Setup Time   Final    GRAM POSITIVE COCCI Gram Stain Report Called to,Read Back By and Verified With: ROWE,C AT 0730 BY HUFFINES,S ON 06/06/17. AEROBIC BOTTLE ONLY    Culture (A)  Final    STAPHYLOCOCCUS AUREUS SUSCEPTIBILITIES PERFORMED ON PREVIOUS CULTURE WITHIN THE LAST 5 DAYS. Performed at [facility], [address] [place], [place] [address]    Report Status 06/08/2017 FINAL  Final  Blood culture (routine x 2)     Status: Abnormal   Collection Time: 06/05/17  2:47 PM  Result Value Ref Range Status   Specimen Description LEFT ANTECUBITAL  Final   Special Requests   Final    BOTTLES DRAWN AEROBIC AND ANAEROBIC Blood Culture adequate volume   Culture  Setup Time   Final    GRAM POSITIVE COCCI BOTH AEB AND ANA Gram Stain Report Called to,Read Back By and Verified With: J.CRUISE, RN @ [facility] ED @ 0545 ON 1.15.19 BY BOWMAN,L CRITICAL RESULT CALLED TO, READ BACK BY AND VERIFIED WITH: N. Batchelder Pharm.D. 8:55 06/06/17 ([email or web address]) Performed at [facility], [address], [place], [place] [address]    Culture STAPHYLOCOCCUS AUREUS (A)  Final   Report Status 06/08/2017 FINAL  Final   Organism ID, Bacteria STAPHYLOCOCCUS AUREUS  Final      Susceptibility   Staphylococcus aureus - MIC*    CIPROFLOXACIN <=0.5 SENSITIVE Sensitive     ERYTHROMYCIN <=0.25 SENSITIVE Sensitive     GENTAMICIN <=0.5 SENSITIVE Sensitive     OXACILLIN <=0.25 SENSITIVE Sensitive     TETRACYCLINE <=1 SENSITIVE Sensitive  VANCOMYCIN <=0.5 SENSITIVE Sensitive     TRIMETH/SULFA <=10 SENSITIVE Sensitive     CLINDAMYCIN <=0.25 SENSITIVE Sensitive     RIFAMPIN <=0.5 SENSITIVE Sensitive     Inducible Clindamycin NEGATIVE Sensitive     * STAPHYLOCOCCUS AUREUS  Blood Culture ID Panel  (Reflexed)     Status: Abnormal   Collection Time: 06/05/17  2:47 PM  Result Value Ref Range Status   Enterococcus species NOT DETECTED NOT DETECTED Final   Listeria monocytogenes NOT DETECTED NOT DETECTED Final   Staphylococcus species DETECTED (A) NOT DETECTED Final    Comment: CRITICAL RESULT CALLED TO, READ BACK BY AND VERIFIED WITH: N. Batchelder Pharm.D. 8:55 06/06/17 ([email or web address])    Staphylococcus aureus DETECTED (A) NOT DETECTED Final    Comment: Methicillin (oxacillin) susceptible Staphylococcus aureus (MSSA). Preferred therapy is anti staphylococcal beta lactam antibiotic (Cefazolin or Nafcillin), unless clinically contraindicated. CRITICAL RESULT CALLED TO, READ BACK BY AND VERIFIED WITH: N. Batchelder Pharm.D. 8:55 06/06/17 ([email or web address])    Methicillin resistance NOT DETECTED NOT DETECTED Final   Streptococcus species NOT DETECTED NOT DETECTED Final   Streptococcus agalactiae NOT DETECTED NOT DETECTED Final   Streptococcus pneumoniae NOT DETECTED NOT DETECTED Final   Streptococcus pyogenes NOT DETECTED NOT DETECTED Final   Acinetobacter baumannii NOT DETECTED NOT DETECTED Final   Enterobacteriaceae species NOT DETECTED NOT DETECTED Final   Enterobacter cloacae complex NOT DETECTED NOT DETECTED Final   Escherichia coli NOT DETECTED NOT DETECTED Final   Klebsiella oxytoca NOT DETECTED NOT DETECTED Final   Klebsiella pneumoniae NOT DETECTED NOT DETECTED Final   Proteus species NOT DETECTED NOT DETECTED Final   Serratia marcescens NOT DETECTED NOT DETECTED Final   Haemophilus influenzae NOT DETECTED NOT DETECTED Final   Neisseria meningitidis NOT DETECTED NOT DETECTED Final   Pseudomonas aeruginosa NOT DETECTED NOT DETECTED Final   Candida albicans NOT DETECTED NOT DETECTED Final   Candida glabrata NOT DETECTED NOT DETECTED Final   Candida krusei NOT DETECTED NOT DETECTED Final   Candida parapsilosis NOT DETECTED NOT DETECTED Final   Candida tropicalis NOT DETECTED NOT DETECTED Final   Culture, blood (routine x 2)     Status: None   Collection Time: 06/06/17  1:45 PM  Result Value Ref Range Status   Specimen Description BLOOD RIGHT ANTECUBITAL  Final   Special Requests   Final    BOTTLES DRAWN AEROBIC AND ANAEROBIC Blood Culture adequate volume   Culture NO GROWTH 5 DAYS  Final   Report Status 06/11/2017 FINAL  Final  Culture, blood (routine x 2)     Status: None   Collection Time: 06/06/17  1:54 PM  Result Value Ref Range Status   Specimen Description BLOOD LEFT ANTECUBITAL  Final   Special Requests   Final    BOTTLES DRAWN AEROBIC AND ANAEROBIC Blood Culture adequate volume   Culture NO GROWTH 5 DAYS  Final   Report Status 06/11/2017 FINAL  Final  Aerobic/Anaerobic Culture (surgical/deep wound)     Status: None   Collection Time: 06/06/17  4:09 PM  Result Value Ref Range Status   Specimen Description WOUND LEFT HIP  Final   Special Requests PT ON VANC ANCEF ROCEPHIN ZOSYN  Final   Gram Stain   Final    NO WBC SEEN NO SQUAMOUS EPITHELIAL CELLS SEEN NO ORGANISMS SEEN    Culture   Final    RARE STAPHYLOCOCCUS AUREUS NO ANAEROBES ISOLATED    Report Status 06/11/2017 FINAL  Final   Organism ID, Bacteria STAPHYLOCOCCUS AUREUS  Final      Susceptibility   Staphylococcus aureus - MIC*    CIPROFLOXACIN <=0.5 SENSITIVE Sensitive     ERYTHROMYCIN <=0.25 SENSITIVE Sensitive     GENTAMICIN <=0.5 SENSITIVE Sensitive     OXACILLIN 0.5 SENSITIVE Sensitive     TETRACYCLINE <=1 SENSITIVE Sensitive     VANCOMYCIN <=0.5 SENSITIVE Sensitive     TRIMETH/SULFA <=10 SENSITIVE Sensitive     CLINDAMYCIN <=0.25 SENSITIVE Sensitive     RIFAMPIN <=0.5 SENSITIVE Sensitive     Inducible Clindamycin NEGATIVE Sensitive     * RARE STAPHYLOCOCCUS AUREUS  Surgical pcr screen     Status: Abnormal   Collection Time: 06/06/17  8:38 PM  Result Value Ref Range Status   MRSA, PCR NEGATIVE NEGATIVE Final   Staphylococcus aureus POSITIVE (A) NEGATIVE Final    Comment: (NOTE) The Xpert  SA Assay (FDA approved for NASAL specimens in patients 76 years of age and older), is one component of a comprehensive surveillance program. It is not intended to diagnose infection nor to guide or monitor treatment.   Culture, sputum-assessment     Status: None   Collection Time: 06/07/17  9:14 AM  Result Value Ref Range Status   Specimen Description SPUTUM  Final   Special Requests NONE  Final   Sputum evaluation THIS SPECIMEN IS ACCEPTABLE FOR SPUTUM CULTURE  Final   Report Status 06/07/2017 FINAL  Final  Culture, respiratory (NON-Expectorated)     Status: None   Collection Time: 06/07/17  9:14 AM  Result Value Ref Range Status   Specimen Description SPUTUM  Final   Special Requests NONE Reflexed from [id number]  Final   Gram Stain   Final    FEW WBC PRESENT, PREDOMINANTLY PMN NO ORGANISMS SEEN    Culture MODERATE CANDIDA TROPICALIS  Final   Report Status 06/09/2017 FINAL  Final  Aerobic/Anaerobic Culture (surgical/deep wound)     Status: None   Collection Time: 06/07/17  3:32 PM  Result Value Ref Range Status   Specimen Description WOUND  Final   Special Requests CHEST WALL ABSCESS  Final   Gram Stain   Final    ABUNDANT WBC PRESENT, PREDOMINANTLY PMN MODERATE GRAM POSITIVE COCCI    Culture   Final    MODERATE STAPHYLOCOCCUS AUREUS NO ANAEROBES ISOLATED    Report Status 06/12/2017 FINAL  Final   Organism ID, Bacteria STAPHYLOCOCCUS AUREUS  Final      Susceptibility   Staphylococcus aureus - MIC*    CIPROFLOXACIN <=0.5 SENSITIVE Sensitive     ERYTHROMYCIN <=0.25 SENSITIVE Sensitive     GENTAMICIN <=0.5 SENSITIVE Sensitive     OXACILLIN 0.5 SENSITIVE Sensitive     TETRACYCLINE <=1 SENSITIVE Sensitive     VANCOMYCIN 1 SENSITIVE Sensitive     TRIMETH/SULFA <=10 SENSITIVE Sensitive     CLINDAMYCIN <=0.25 SENSITIVE Sensitive     RIFAMPIN <=0.5 SENSITIVE Sensitive     Inducible Clindamycin NEGATIVE Sensitive     * MODERATE STAPHYLOCOCCUS AUREUS  Aerobic/Anaerobic Culture  (surgical/deep wound)     Status: None (Preliminary result)   Collection Time: 06/09/17  6:24 PM  Result Value Ref Range Status   Specimen Description ABSCESS LEFT ILIACUS  Final   Special Requests NONE  Final   Gram Stain   Final    ABUNDANT WBC PRESENT, PREDOMINANTLY PMN NO ORGANISMS SEEN    Culture   Final    FEW STAPHYLOCOCCUS AUREUS SUSCEPTIBILITIES PERFORMED ON PREVIOUS CULTURE WITHIN THE LAST 5 DAYS. NO ANAEROBES ISOLATED  Report Status PENDING  Incomplete    Radiology Reports Dg Chest 2 View  Result Date: 06/05/2017 CLINICAL DATA:  Shortness of Breath EXAM: CHEST  2 VIEW COMPARISON:  December 01, 2011 FINDINGS: There is patchy infiltrate in the lateral right base. Lungs elsewhere are clear. Heart size and pulmonary vascularity are normal. No adenopathy. No bone lesions. IMPRESSION: Infiltrate consistent with pneumonia lateral right base. Lungs elsewhere clear. Cardiac silhouette within normal limits. Electronically Signed   By: Bretta Bang III M.D.   On: 06/05/2017 14:47   Ct Soft Tissue Neck W Contrast  Result Date: 06/05/2017 CLINICAL DATA:  Neck mass.  Hard, red, raised area in the neck. EXAM: CT NECK WITH CONTRAST TECHNIQUE: Multidetector CT imaging of the neck was performed using the standard protocol following the bolus administration of intravenous contrast. CONTRAST:  ISOVUE-300 IOPAMIDOL (ISOVUE-300) INJECTION 61% COMPARISON:  None. FINDINGS: Pharynx and larynx: No pharyngeal or laryngeal mass. Patent airway. No retropharyngeal fluid collection. Salivary glands: No inflammation, mass, or stone. Thyroid: Mass effect on the right thyroid lobe by the inflammatory process described below. No intrinsic thyroid abnormality identified. Lymph nodes: Subcentimeter but asymmetric anterior and posterior cervical lymph nodes throughout the right neck, likely reactive. Vascular: Major vascular structures of the neck are patent. The below described inflammatory process extends  into the right carotid space in the lower neck. Limited intracranial: Unremarkable. Visualized orbits: Not imaged. Mastoids and visualized paranasal sinuses: Partially visualized polypoid mucosal thickening or small mucous retention cyst in the left maxillary sinus. Visualized mastoid air cells are clear. Skeleton: There is a heterogeneous gas and fluid collection along the medial aspect of the right sternocleidomastoid muscle in the lower neck beginning near the clavicular head. This collection extends posteriorly and inferiorly into the anterior mediastinum, more fully evaluated on the separate chest CT. Phlegmon extends superiorly within/along the right sternocleidomastoid muscle in the lower and mid neck. Fluid extends superiorly in the right neck deep to the strap muscles and contains a few locules of gas, however this fluid does not appear as organized as the collections in the lower neck and upper chest. Inflammation in the right neck extends just superior to the level of the thyroid cartilage. No erosive changes are seen at the right sternoclavicular joint. Upper chest: Reported separately. Other: None. IMPRESSION: Gas and fluid collections in the anterior upper chest as reported on separate chest CT, concerning for abscesses. Phlegmon and less organized gas and fluid extend superiorly in the anterior right neck with suspected myositis of the right sternocleidomastoid muscle. Electronically Signed   By: Sebastian Ache M.D.   On: 06/05/2017 16:31   Ct Chest W Contrast  Result Date: 06/05/2017 CLINICAL DATA:  Patient with shortness of breath. Red raised area anterior lower neck. EXAM: CT CHEST WITH CONTRAST TECHNIQUE: Multidetector CT imaging of the chest was performed during intravenous contrast administration. CONTRAST:  ISOVUE-300 IOPAMIDOL (ISOVUE-300) INJECTION 61% COMPARISON:  Chest CT 06/21/2005; neck CT same day. FINDINGS: Cardiovascular: Normal heart size. No pericardial effusion. Normal  caliber thoracic aorta. Enlarged main pulmonary artery (3.3 cm) as can be seen with pulmonary arterial hypertension. Mediastinum/Nodes: 1.2 cm AP window lymph node (image 50; series 2). 7 mm right paratracheal lymph node (image 50; series 2). Normal appearance of the esophagus. No axillary adenopathy. Lungs/Pleura: Central airways are patent. Subpleural consolidation with adjacent tree-in-bud ground-glass nodularity within the right lower lobe (image 114; series 4). Within the right upper lobe there multiple areas of regional ground-glass attenuation. No pleural  effusion or pneumothorax. Upper Abdomen: No acute process. Musculoskeletal: There is a large centrally necrotic peripherally enhancing mass within the lower neck which extends from the anterior aspect of the right sternal clavicular joint/distal right sternocleidomastoid superiorly along the right aspect of the thyroid and inferiorly within the retrosternal location. The largest portion of this mass measures 4.7 x 4.1 cm. There are multiple small foci of gas within the mass. No definite osseous destruction demonstrated within the right sternoclavicular joint. IMPRESSION: 1. There is a large peripherally enhancing mass with central fluid and gas within the right lower neck extending from the anterior aspect of the right sternoclavicular location posteriorly along the retrosternal soft tissues. Findings are concerning for abscess. No definite osseous destruction at this time of the right sternoclavicular joint or adjacent costochondral joint however secondary infection of these joints is not excluded given the size of the suspected abscess. 2. Consolidation within the right lower lobe with right upper lobe ground-glass opacities favored to represent multifocal pneumonia. 3. Mediastinal and right hilar adenopathy likely reactive in etiology. Critical Value/emergent results were called by telephone at the time of interpretation on 06/05/2017 at 4:18 pm to Dr.  Fayrene Fearing, who verbally acknowledged these results. Electronically Signed   By: Annia Belt M.D.   On: 06/05/2017 16:29   Mr Hip Left W Wo Contrast  Result Date: 06/06/2017 CLINICAL DATA:  Severe left hip pain for the past few weeks. History of IV drug abuse and recurrent skin and soft tissue infections. EXAM: MRI OF THE LEFT HIP WITHOUT AND WITH CONTRAST TECHNIQUE: Multiplanar, multisequence MR imaging was performed both before and after administration of intravenous contrast. CONTRAST:  15mL MULTIHANCE GADOBENATE DIMEGLUMINE 529 MG/ML IV SOLN COMPARISON:  None. FINDINGS: Bones: There is no evidence of acute fracture, dislocation or avascular necrosis. The visualized bony pelvis appears normal. The visualized sacroiliac joints and symphysis pubis appear normal. Articular cartilage and labrum Articular cartilage: No focal chondral defect or subchondral signal abnormality identified. Labrum: There is a tear of the left anterior superior labrum. Joint or bursal effusion Joint effusion: Small left hip joint effusion with prominent synovial enhancement. No right hip joint effusion. Bursae: Prominent, rim enhancing fluid within the left iliopsoas bursa, extending superiorly into the left iliacus muscle. The bursa measures up to 2.5 x 3.3 cm in maximal AP by TR dimension. No greater trochanteric bursal fluid. Muscles and tendons Muscles and tendons: Prominent muscle edema and enhancement of the left iliacus muscle. Mild myofascial edema along the left vastus and adductor compartments. The visualized gluteus, hamstring and iliopsoas tendons appear normal. The piriformis muscles appear symmetric. Other findings Miscellaneous: Trace presacral edema. The visualized internal pelvic contents otherwise appear unremarkable. IMPRESSION: 1. Findings consistent with left hip septic arthritis and left iliopsoas septic bursitis. The left iliopsoas bursa extends into the left iliacus muscle, which demonstrates prominent muscle edema  and enhancement, consistent with infectious myositis. The preliminary findings were called by telephone at the time of interpretation on 06/06/2017 at 4:00 am to Dr. Marcial Pacas OPYD, by Dr. Elgie Collard. Electronically Signed   By: Obie Dredge M.D.   On: 06/06/2017 08:15   Dg Chest Port 1 View  Result Date: 06/07/2017 CLINICAL DATA:  Shortness of breath, post debridement of RIGHT sternoclavicular abscess EXAM: PORTABLE CHEST 1 VIEW COMPARISON:  Portable exam 1639 hours compared to 06/05/2017 FINDINGS: Normal heart size, mediastinal contours, and pulmonary vascularity. Mild bronchitic changes. Question mild atelectasis versus infiltrate at medial RIGHT lung base. No additional infiltrate, pleural effusion or  pneumothorax. No acute osseous findings. IMPRESSION: Mild chronic bronchitic changes with question minimal atelectasis versus infiltrate at medial RIGHT lung base. Electronically Signed   By: Ulyses Southward M.D.   On: 06/07/2017 17:47   Ct Image Guided Drainage By Percutaneous Catheter  Result Date: 06/09/2017 INDICATION: 36 year old male IV drug user with left iliacus abscess. EXAM: CT GUIDED DRAINAGE OF  ABSCESS MEDICATIONS: The patient is currently admitted to the hospital and receiving intravenous antibiotics. The antibiotics were administered within an appropriate time frame prior to the initiation of the procedure. ANESTHESIA/SEDATION: 4.5 mg IV Versed 200 mcg IV Fentanyl Moderate Sedation Time:  25 minutes The patient was continuously monitored during the procedure by the interventional radiology nurse under my direct supervision. COMPLICATIONS: None immediate. TECHNIQUE: Informed written consent was obtained from the patient after a thorough discussion of the procedural risks, benefits and alternatives. All questions were addressed. Maximal Sterile Barrier Technique was utilized including caps, mask, sterile gowns, sterile gloves, sterile drape, hand hygiene and skin antiseptic. A timeout was  performed prior to the initiation of the procedure. PROCEDURE: The operative field was prepped with Chlorhexidine in a sterile fashion, and a sterile drape was applied covering the operative field. A sterile gown and sterile gloves were used for the procedure. Local anesthesia was provided with 1% Lidocaine. A planning axial CT scan was performed. The fluid and gas collection in the left iliac fossa was successfully identified. Local anesthesia was attained by infiltration with 1% lidocaine. A small dermatotomy was made. Under intermittent CT guidance, an 18 gauge trocar needle was advanced into the fluid collection. A 0.035 Amplatz wire was advanced. The trocar needle was removed. The skin tract was dilated to 12 [place]. A 12 French all-purpose drainage catheter was advanced over the wire and formed. Aspiration yields approximately 30 mL thick purulent fluid. A sample was sent for culture. Catheter was secured to the skin with 0 Prolene suture. A sterile bandage was placed. Post drain placement imaging demonstrates a well-positioned drainage catheter with near-total resolution of the abscess cavity. FINDINGS: Left iliacus abscess. IMPRESSION: Successful placement of a 12 French drainage catheter into the left iliacus abscess. PLAN: Maintain drain to JP bulb suction.  Flush every shift. When drain output is less than 10 mL per day for 2 consecutive days, the drain may be removed. Electronically Signed   By: Malachy Moan M.D.   On: 06/09/2017 18:59    Lab Data:  CBC: Recent Labs  Lab 06/08/17 0541 06/09/17 1237  WBC 21.5* 19.4*  HGB 11.5* 12.2*  HCT 34.6* 36.4*  MCV 89.9 89.4  PLT 398 500*   Basic Metabolic Panel: Recent Labs  Lab 06/10/17 0635 06/11/17 0915 06/12/17 0718 06/13/17 0527 06/14/17 0810  NA 135 136 140 135 136  K 3.4* 4.0 4.1 3.7 4.0  CL 102 103 103 100* 100*  CO2 22 22 26 24 25   GLUCOSE 121* 105* 105* 92 96  BUN 10 7 10 10 8   CREATININE 0.62 0.56* 0.53* 0.48* 0.62    CALCIUM 8.5* 8.8* 9.1 9.0 9.3   GFR: Estimated Creatinine Clearance: 128.2 mL/min (by C-G formula based on SCr of 0.62 mg/dL). Liver Function Tests: Recent Labs  Lab 06/09/17 1053  AST 42*  ALT 36  ALKPHOS 165*  BILITOT 0.6  PROT 6.6  ALBUMIN 2.6*   No results for input(s): LIPASE, AMYLASE in the last 168 hours. No results for input(s): AMMONIA in the last 168 hours. Coagulation Profile: No results for input(s): INR, PROTIME  in the last 168 hours. Cardiac Enzymes: No results for input(s): CKTOTAL, CKMB, CKMBINDEX, TROPONINI in the last 168 hours. BNP (last 3 results) No results for input(s): PROBNP in the last 8760 hours. HbA1C: No results for input(s): HGBA1C in the last 72 hours. CBG: No results for input(s): GLUCAP in the last 168 hours. Lipid Profile: No results for input(s): CHOL, HDL, LDLCALC, TRIG, CHOLHDL, LDLDIRECT in the last 72 hours. Thyroid Function Tests: No results for input(s): TSH, T4TOTAL, FREET4, T3FREE, THYROIDAB in the last 72 hours. Anemia Panel: No results for input(s): VITAMINB12, FOLATE, FERRITIN, TIBC, IRON, RETICCTPCT in the last 72 hours. Urine analysis:    Component Value Date/Time   COLORURINE YELLOW 06/06/2017 1333   APPEARANCEUR CLEAR 06/06/2017 1333   LABSPEC 1.010 06/06/2017 1333   PHURINE 6.0 06/06/2017 1333   GLUCOSEU NEGATIVE 06/06/2017 1333   HGBUR NEGATIVE 06/06/2017 1333   BILIRUBINUR NEGATIVE 06/06/2017 1333   KETONESUR NEGATIVE 06/06/2017 1333   PROTEINUR NEGATIVE 06/06/2017 1333   UROBILINOGEN 0.2 10/03/2012 0856   NITRITE NEGATIVE 06/06/2017 1333   LEUKOCYTESUR NEGATIVE 06/06/2017 1333     Clifford Tucker M.D. [facility] 06/14/2017, 12:20 PM  Pager: [phone] Between 7am to 7pm - call Pager - [phone]  After 7pm go to [email or web address] - password [email or web address]  Call night coverage person covering after 7pm

## 2017-06-14 NOTE — Progress Notes (Signed)
Patient disconnected himself from his IV tubing, made him aware not to do it again, refused to have his wound vac dressing changed, claimed that it was just changed yesterday.

## 2017-06-14 NOTE — Progress Notes (Signed)
Patient ID: Clifford Tucker, male   DOB: Dec 18, 1980, 37 y.o.   MRN: [id number]    Referring Physician(s): Dr. Thad Rangeripudeep Rai  Supervising Physician: Malachy MoanMcCullough, Heath  [facility]  Chief Complaint: Left iliacus abscess  Subjective: Patient feeling ok in regards to his abdomen.  States output is mostly drain flushes.  Allergies: Patient has no known allergies.  Medications: Prior to Admission medications   Medication Sig Start Date End Date Taking? Authorizing Provider  Aspirin-Salicylamide-Caffeine (BC HEADACHE POWDER PO) Take 2-6 packets by mouth daily as needed (pain).   Yes [provider]  HYDROcodone-acetaminophen (NORCO/VICODIN) 5-325 MG per tablet 1 or 2 tabs PO q6 hours prn pain Patient taking differently: Take 1-2 tablets by mouth every 6 (six) hours as needed for moderate pain.  10/03/12  Yes Samuel JesterMcManus, Kathleen, DO  ibuprofen (ADVIL,MOTRIN) 200 MG tablet Take 200-400 mg by mouth every 6 (six) hours as needed for mild pain.   Yes [provider]    Vital Signs: BP 126/77 (BP Location: Right Arm)   Pulse 79   Temp 98.9 F (37.2 C) (Oral)   Resp 16   Ht 5\' 10"  (1.778 m)   Wt 154 lb 15.7 oz (70.3 kg)   SpO2 97%   BMI 22.24 kg/m   Physical Exam: Abd: soft, NT, Nd, +BS, drain with minimal serous output.  Less than 14cc documented yesterday.  Site is clean  Imaging: No results found.  Labs:  CBC: Recent Labs    06/06/17 0315 06/07/17 0210 06/08/17 0541 06/09/17 1237  WBC 19.7* 18.0* 21.5* 19.4*  HGB 11.0* 11.2* 11.5* 12.2*  HCT 33.1* 33.1* 34.6* 36.4*  PLT 317 334 398 500*    COAGS: Recent Labs    06/07/17 0210  INR 1.11  APTT 39*    BMP: Recent Labs    06/11/17 0915 06/12/17 0718 06/13/17 0527 06/14/17 0810  NA 136 140 135 136  K 4.0 4.1 3.7 4.0  CL 103 103 100* 100*  CO2 22 26 24 25   GLUCOSE 105* 105* 92 96  BUN 7 10 10 8   CALCIUM 8.8* 9.1 9.0 9.3  CREATININE 0.56* 0.53* 0.48* 0.62  GFRNONAA >60  >60 >60 >60  GFRAA >60 >60 >60 >60    LIVER FUNCTION TESTS: Recent Labs    06/05/17 1444 06/06/17 0315 06/07/17 0210 06/09/17 1053  BILITOT 0.5 0.3 0.3 0.6  AST 67* 43* 41 42*  ALT 63 47 39 36  ALKPHOS 201* 176* 185* 165*  PROT 7.5 6.4* 5.6* 6.6  ALBUMIN 2.9* 2.3* 2.1* 2.6*    Assessment and Plan: 1. Left iliacus abscess, s/p perc drain  Drain has been present for 5 days and his output is minimal.  He is going down today for a CT of his shoulder.  Will go ahead and order a repeat CT of his abd/pel to evaluate this collection to determine if his drain needs to remain in place.   Electronically Signed: Letha CapeKelly E Verginia Toohey 06/14/2017, 11:55 AM   I spent a total of 15 Minutes at the the patient's bedside AND on the patient's hospital floor or unit, greater than 50% of which was counseling/coordinating care for left iliacus abscess

## 2017-06-14 NOTE — Anesthesia Preprocedure Evaluation (Signed)
Anesthesia Evaluation  Patient identified by MRN, date of birth, ID band Patient awake    Reviewed: Allergy & Precautions, NPO status , Patient's Chart, lab work & pertinent test results  Airway Mallampati: II       Dental  (+) Poor Dentition   Pulmonary Current Smoker,    Pulmonary exam normal breath sounds clear to auscultation       Cardiovascular negative cardio ROS   Rhythm:Regular Rate:Tachycardia  ECG: ST   Neuro/Psych negative psych ROS   GI/Hepatic negative GI ROS, (+)     substance abuse  IV drug use,   Endo/Other  negative endocrine ROS  Renal/GU negative Renal ROS     Musculoskeletal  (+) Arthritis , Septic   Abdominal Normal abdominal exam  (+)   Peds  Hematology  (+) Blood dyscrasia, anemia , MSSA bacteremia   Anesthesia Other Findings NECK ABSCESS  Reproductive/Obstetrics                             Anesthesia Physical  Anesthesia Plan  ASA: III  Anesthesia Plan: General   Post-op Pain Management:    Induction: Intravenous  PONV Risk Score and Plan: 2 and Ondansetron, Dexamethasone, Midazolam and Treatment may vary due to age or medical condition  Airway Management Planned: Oral ETT and Video Laryngoscope Planned  Additional Equipment:   Intra-op Plan:   Post-operative Plan: Extubation in OR  Informed Consent: I have reviewed the patients History and Physical, chart, labs and discussed the procedure including the risks, benefits and alternatives for the proposed anesthesia with the patient or authorized representative who has indicated his/her understanding and acceptance.   Dental advisory given  Plan Discussed with: CRNA  Anesthesia Plan Comments:         Anesthesia Quick Evaluation

## 2017-06-14 NOTE — Progress Notes (Addendum)
      [address]       [place] [id number]             [phone]      7 Days Post-Op Procedure(s) (LRB): DEBRIDEMENT RIGHT STERNOCLAVICULAR ABSCESS (Right) APPLICATION OF WOUND VAC (Right) Subjective: Right scapular discomfort  Objective: Vital signs in last 24 hours: Temp:  [98 F (36.7 C)-98.9 F (37.2 C)] 98.9 F (37.2 C) (01/23 0451) Pulse Rate:  [75-84] 79 (01/23 0451) Resp:  [16] 16 (01/23 0451) BP: (107-126)/(61-77) 126/77 (01/23 0451) SpO2:  [97 %-99 %] 97 % (01/23 0451)  Hemodynamic parameters for last 24 hours:    Intake/Output from previous day: 01/22 0701 - 01/23 0700 In: 320 [P.O.:320] Out: -  Intake/Output this shift: No intake/output data recorded.  General appearance: alert, cooperative and no distress Heart: regular rate and rhythm Lungs: clear to auscultation bilaterally Wound: vac site ok, no cellulitis or purulence  Lab Results: No results for input(s): WBC, HGB, HCT, PLT in the last 72 hours. BMET:  Recent Labs    06/12/17 0718 06/13/17 0527  NA 140 135  K 4.1 3.7  CL 103 100*  CO2 26 24  GLUCOSE 105* 92  BUN 10 10  CREATININE 0.53* 0.48*  CALCIUM 9.1 9.0    PT/INR: No results for input(s): LABPROT, INR in the last 72 hours. ABG    Component Value Date/Time   PHART 7.430 06/07/2017 0940   HCO3 27.5 06/07/2017 0940   TCO2 29 06/05/2017 1457   ACIDBASEDEF 4.0 (H) 03/13/2007 0120   O2SAT 90.2 06/07/2017 0940   CBG (last 3)  No results for input(s): GLUCAP in the last 72 hours.  Meds Scheduled Meds: . enoxaparin (LOVENOX) injection  40 mg Subcutaneous Q24H  . iopamidol  20 mL Intra-articular Once  . lidocaine (PF)  5 mL Intradermal Once  . nicotine  21 mg Transdermal Daily  . pantoprazole  40 mg Oral Q0600  . sodium chloride  10 mL Intravenous Once  . sodium chloride flush  5 mL Intravenous Q8H   Continuous Infusions: . sodium chloride    .  ceFAZolin (ANCEF) IV Stopped (06/14/17 0319)  . dextrose 5  % and 0.45% NaCl 30 mL/hr at 06/07/17 2000  . lactated ringers 10 mL/hr at 06/06/17 1406  . lactated ringers 10 mL/hr at 06/07/17 1354  . potassium chloride     PRN Meds:.acetaminophen **OR** acetaminophen, alum & mag hydroxide-simeth, diphenhydrAMINE, HYDROmorphone (DILAUDID) injection, metoCLOPramide **OR** metoCLOPramide (REGLAN) injection, ondansetron **OR** ondansetron (ZOFRAN) IV, oxyCODONE, potassium chloride, senna-docusate, zolpidem  Xrays No results found.  Assessment/Plan: S/P Procedure(s) (LRB): DEBRIDEMENT RIGHT STERNOCLAVICULAR ABSCESS (Right) APPLICATION OF WOUND VAC (Right)  1 stable- cont current vac management/ABX per ID 2 may need to consider imaging of right scapular region with increasing pain- no erethema or apparent abscess- will defer to ortho   LOS: 9 days    [name] 06/14/2017  WBC remains elevated- afebrile with HR < 80 Will irrigate R sternoclavicular wound and apply Acell powder if clean, change VAC Procedure discussed with patient  patient examined and medical record reviewed,agree with above note. [name] 06/14/2017

## 2017-06-15 ENCOUNTER — Encounter (HOSPITAL_COMMUNITY)
Admission: EM | Disposition: A | Discharge status: Home / Self Care | Payer: Self-pay | Source: Home / Self Care | Attending: Internal Medicine

## 2017-06-15 ENCOUNTER — Inpatient Hospital Stay (HOSPITAL_COMMUNITY): Payer: Self-pay | Admitting: Certified Registered Nurse Anesthetist

## 2017-06-15 ENCOUNTER — Encounter (HOSPITAL_COMMUNITY): Payer: Self-pay | Admitting: Certified Registered Nurse Anesthetist

## 2017-06-15 HISTORY — PX: APPLICATION OF WOUND VAC: SHX5189

## 2017-06-15 HISTORY — PX: I&D EXTREMITY: SHX5045

## 2017-06-15 LAB — CBC
HCT: 36.5 % — ABNORMAL LOW (ref 39.0–52.0)
Hemoglobin: 11.8 g/dL — ABNORMAL LOW (ref 13.0–17.0)
MCH: 29.9 pg (ref 26.0–34.0)
MCHC: 32.3 g/dL (ref 30.0–36.0)
MCV: 92.4 fL (ref 78.0–100.0)
Platelets: 809 10*3/uL — ABNORMAL HIGH (ref 150–400)
RBC: 3.95 MIL/uL — ABNORMAL LOW (ref 4.22–5.81)
RDW: 15.1 % (ref 11.5–15.5)
WBC: 10.1 10*3/uL (ref 4.0–10.5)

## 2017-06-15 LAB — BASIC METABOLIC PANEL
Anion gap: 10 (ref 5–15)
BUN: 8 mg/dL (ref 6–20)
CO2: 25 mmol/L (ref 22–32)
Calcium: 9 mg/dL (ref 8.9–10.3)
Chloride: 103 mmol/L (ref 101–111)
Creatinine, Ser: 0.6 mg/dL — ABNORMAL LOW (ref 0.61–1.24)
GFR calc Af Amer: >60 mL/min (ref >60)
GFR calc non Af Amer: >60 mL/min (ref >60)
Glucose, Bld: 86 mg/dL (ref 65–99)
Potassium: 4.3 mmol/L (ref 3.5–5.1)
Sodium: 138 mmol/L (ref 135–145)

## 2017-06-15 LAB — TYPE AND SCREEN
ABO/RH(D): O NEG
Antibody Screen: NEGATIVE

## 2017-06-15 LAB — AEROBIC/ANAEROBIC CULTURE (SURGICAL/DEEP WOUND)

## 2017-06-15 LAB — AEROBIC/ANAEROBIC CULTURE W GRAM STAIN (SURGICAL/DEEP WOUND)

## 2017-06-15 SURGERY — IRRIGATION AND DEBRIDEMENT EXTREMITY
Anesthesia: General | Laterality: Right

## 2017-06-15 MED ORDER — PROPOFOL 10 MG/ML IV BOLUS
INTRAVENOUS | Status: DC | PRN
  Administered 2017-06-15: 30 mg via INTRAVENOUS
  Administered 2017-06-15: 20 mg via INTRAVENOUS
  Administered 2017-06-15: 100 mg via INTRAVENOUS
  Administered 2017-06-15: 50 mg via INTRAVENOUS

## 2017-06-15 MED ORDER — ROCURONIUM BROMIDE 10 MG/ML (PF) SYRINGE
PREFILLED_SYRINGE | INTRAVENOUS | Status: AC
  Filled 2017-06-15: qty 5

## 2017-06-15 MED ORDER — FENTANYL CITRATE (PF) 100 MCG/2ML IJ SOLN
INTRAMUSCULAR | Status: DC | PRN
  Administered 2017-06-15 (×2): 50 ug via INTRAVENOUS
  Administered 2017-06-15: 150 ug via INTRAVENOUS

## 2017-06-15 MED ORDER — KETOROLAC TROMETHAMINE 30 MG/ML IJ SOLN
30.0000 mg | Freq: Once | INTRAMUSCULAR | Status: DC | PRN
  Administered 2017-06-15: 30 mg via INTRAVENOUS

## 2017-06-15 MED ORDER — HYDROMORPHONE HCL 1 MG/ML IJ SOLN
INTRAMUSCULAR | Status: AC
  Administered 2017-06-15: 0.5 mg via INTRAVENOUS
  Filled 2017-06-15: qty 1

## 2017-06-15 MED ORDER — FENTANYL CITRATE (PF) 250 MCG/5ML IJ SOLN
INTRAMUSCULAR | Status: AC
  Filled 2017-06-15: qty 5

## 2017-06-15 MED ORDER — VANCOMYCIN HCL 1000 MG IV SOLR
INTRAVENOUS | Status: DC | PRN
  Administered 2017-06-15: 1000 mg

## 2017-06-15 MED ORDER — PROPOFOL 10 MG/ML IV BOLUS
INTRAVENOUS | Status: AC
  Filled 2017-06-15: qty 20

## 2017-06-15 MED ORDER — MEPERIDINE HCL 25 MG/ML IJ SOLN: 6.2500 mg | INTRAMUSCULAR | Status: DC | PRN

## 2017-06-15 MED ORDER — SODIUM CHLORIDE 0.9 % IR SOLN
Status: DC | PRN
  Administered 2017-06-15: 2000 mL

## 2017-06-15 MED ORDER — PHENYLEPHRINE 40 MCG/ML (10ML) SYRINGE FOR IV PUSH (FOR BLOOD PRESSURE SUPPORT)
PREFILLED_SYRINGE | INTRAVENOUS | Status: AC
  Filled 2017-06-15: qty 10

## 2017-06-15 MED ORDER — HYDROMORPHONE HCL 1 MG/ML IJ SOLN
0.2500 mg | INTRAMUSCULAR | Status: DC | PRN
  Administered 2017-06-15 (×4): 0.5 mg via INTRAVENOUS

## 2017-06-15 MED ORDER — KETOROLAC TROMETHAMINE 30 MG/ML IJ SOLN
INTRAMUSCULAR | Status: AC
  Administered 2017-06-15: 30 mg via INTRAVENOUS
  Filled 2017-06-15: qty 1

## 2017-06-15 MED ORDER — VANCOMYCIN HCL 1000 MG IV SOLR
INTRAVENOUS | Status: AC
  Filled 2017-06-15: qty 1000

## 2017-06-15 MED ORDER — PHENYLEPHRINE 40 MCG/ML (10ML) SYRINGE FOR IV PUSH (FOR BLOOD PRESSURE SUPPORT)
PREFILLED_SYRINGE | INTRAVENOUS | Status: DC | PRN
  Administered 2017-06-15: 120 ug via INTRAVENOUS
  Administered 2017-06-15 (×2): 80 ug via INTRAVENOUS
  Administered 2017-06-15: 40 ug via INTRAVENOUS

## 2017-06-15 MED ORDER — LACTATED RINGERS IV SOLN
INTRAVENOUS | Status: DC | PRN
  Administered 2017-06-15: 07:00:00 via INTRAVENOUS

## 2017-06-15 MED ORDER — LIDOCAINE 2% (20 MG/ML) 5 ML SYRINGE
INTRAMUSCULAR | Status: AC
  Filled 2017-06-15: qty 5

## 2017-06-15 MED ORDER — KETAMINE HCL-SODIUM CHLORIDE 100-0.9 MG/10ML-% IV SOSY
PREFILLED_SYRINGE | INTRAVENOUS | Status: AC
  Filled 2017-06-15: qty 10

## 2017-06-15 MED ORDER — DEXAMETHASONE SODIUM PHOSPHATE 10 MG/ML IJ SOLN
INTRAMUSCULAR | Status: DC | PRN
  Administered 2017-06-15: 10 mg via INTRAVENOUS

## 2017-06-15 MED ORDER — MIDAZOLAM HCL 2 MG/2ML IJ SOLN
INTRAMUSCULAR | Status: AC
  Filled 2017-06-15: qty 2

## 2017-06-15 MED ORDER — ROCURONIUM BROMIDE 10 MG/ML (PF) SYRINGE
PREFILLED_SYRINGE | INTRAVENOUS | Status: DC | PRN
  Administered 2017-06-15: 50 mg via INTRAVENOUS

## 2017-06-15 MED ORDER — ONDANSETRON HCL 4 MG/2ML IJ SOLN
INTRAMUSCULAR | Status: AC
  Filled 2017-06-15: qty 2

## 2017-06-15 MED ORDER — KETAMINE HCL 10 MG/ML IJ SOLN
INTRAMUSCULAR | Status: DC | PRN
  Administered 2017-06-15: 60 mg via INTRAVENOUS

## 2017-06-15 MED ORDER — ONDANSETRON HCL 4 MG/2ML IJ SOLN
INTRAMUSCULAR | Status: DC | PRN
  Administered 2017-06-15: 4 mg via INTRAVENOUS

## 2017-06-15 MED ORDER — CEFAZOLIN SODIUM-DEXTROSE 1-4 GM/50ML-% IV SOLN
INTRAVENOUS | Status: DC | PRN
  Administered 2017-06-15: 1 g via INTRAVENOUS

## 2017-06-15 MED ORDER — SUGAMMADEX SODIUM 200 MG/2ML IV SOLN
INTRAVENOUS | Status: DC | PRN
  Administered 2017-06-15: 150 mg via INTRAVENOUS

## 2017-06-15 MED ORDER — CEFAZOLIN SODIUM 1 G IJ SOLR
INTRAMUSCULAR | Status: AC
  Filled 2017-06-15: qty 10

## 2017-06-15 MED ORDER — DEXAMETHASONE SODIUM PHOSPHATE 10 MG/ML IJ SOLN
INTRAMUSCULAR | Status: AC
  Filled 2017-06-15: qty 1

## 2017-06-15 MED ORDER — MIDAZOLAM HCL 5 MG/5ML IJ SOLN
INTRAMUSCULAR | Status: DC | PRN
  Administered 2017-06-15: 2 mg via INTRAVENOUS

## 2017-06-15 MED ORDER — LIDOCAINE 2% (20 MG/ML) 5 ML SYRINGE
INTRAMUSCULAR | Status: DC | PRN
  Administered 2017-06-15: 100 mg via INTRAVENOUS

## 2017-06-15 MED ORDER — SODIUM CHLORIDE 0.9 % IR SOLN
Status: DC | PRN
  Administered 2017-06-15: 3000 mL

## 2017-06-15 MED ORDER — SODIUM CHLORIDE 0.9 % IJ SOLN
INTRAMUSCULAR | Status: AC
  Filled 2017-06-15: qty 10

## 2017-06-15 MED ORDER — SUGAMMADEX SODIUM 200 MG/2ML IV SOLN
INTRAVENOUS | Status: AC
  Filled 2017-06-15: qty 2

## 2017-06-15 MED ORDER — PROMETHAZINE HCL 25 MG/ML IJ SOLN: 6.2500 mg | INTRAMUSCULAR | Status: DC | PRN

## 2017-06-15 SURGICAL SUPPLY — 63 items
BANDAGE ACE 4X5 VEL STRL LF (GAUZE/BANDAGES/DRESSINGS) IMPLANT
BANDAGE ACE 6X5 VEL STRL LF (GAUZE/BANDAGES/DRESSINGS) IMPLANT
BENZOIN TINCTURE PRP APPL 2/3 (GAUZE/BANDAGES/DRESSINGS) IMPLANT
BLADE SURG 10 STRL SS (BLADE) IMPLANT
BLADE SURG 15 STRL LF DISP TIS (BLADE) IMPLANT
BLADE SURG 15 STRL SS (BLADE)
BNDG GAUZE ELAST 4 BULKY (GAUZE/BANDAGES/DRESSINGS) IMPLANT
BRUSH SCRUB EZ PLAIN DRY (MISCELLANEOUS) ×9 IMPLANT
CANISTER SUCT 3000ML PPV (MISCELLANEOUS) ×3 IMPLANT
CATH THORACIC 28FR RT ANG (CATHETERS) IMPLANT
CATH THORACIC 36FR (CATHETERS) IMPLANT
CATH THORACIC 36FR RT ANG (CATHETERS) IMPLANT
CLIP VESOCCLUDE SM WIDE 24/CT (CLIP) IMPLANT
CONT SPEC 4OZ CLIKSEAL STRL BL (MISCELLANEOUS) IMPLANT
COVER SURGICAL LIGHT HANDLE (MISCELLANEOUS) ×3 IMPLANT
DRAPE LAPAROSCOPIC ABDOMINAL (DRAPES) ×3 IMPLANT
DRAPE SLUSH/WARMER DISC (DRAPES) IMPLANT
DRSG AQUACEL AG ADV 3.5X14 (GAUZE/BANDAGES/DRESSINGS) ×3 IMPLANT
DRSG CUTIMED SORBACT 7X9 (GAUZE/BANDAGES/DRESSINGS) ×3 IMPLANT
DRSG PAD ABDOMINAL 8X10 ST (GAUZE/BANDAGES/DRESSINGS) IMPLANT
DRSG VAC ATS SM SENSATRAC (GAUZE/BANDAGES/DRESSINGS) ×3 IMPLANT
ELECT REM PT RETURN 9FT ADLT (ELECTROSURGICAL) ×3
ELECTRODE REM PT RTRN 9FT ADLT (ELECTROSURGICAL) ×1 IMPLANT
GAUZE SPONGE 4X4 12PLY STRL (GAUZE/BANDAGES/DRESSINGS) ×3 IMPLANT
GAUZE XEROFORM 5X9 LF (GAUZE/BANDAGES/DRESSINGS) IMPLANT
GLOVE BIO SURGEON STRL SZ7.5 (GLOVE) ×6 IMPLANT
GOWN STRL REUS W/ TWL LRG LVL3 (GOWN DISPOSABLE) ×2 IMPLANT
GOWN STRL REUS W/TWL LRG LVL3 (GOWN DISPOSABLE) ×4
HANDPIECE INTERPULSE COAX TIP (DISPOSABLE) ×2
HEMOSTAT POWDER SURGIFOAM 1G (HEMOSTASIS) IMPLANT
HEMOSTAT SURGICEL 2X14 (HEMOSTASIS) IMPLANT
KIT BASIN OR (CUSTOM PROCEDURE TRAY) ×3 IMPLANT
KIT ROOM TURNOVER OR (KITS) ×3 IMPLANT
KIT SUCTION CATH 14FR (SUCTIONS) IMPLANT
MICROMATRIX 500MG (Tissue) ×3 IMPLANT
NS IRRIG 1000ML POUR BTL (IV SOLUTION) ×3 IMPLANT
PACK CHEST (CUSTOM PROCEDURE TRAY) IMPLANT
PACK GENERAL/GYN (CUSTOM PROCEDURE TRAY) ×3 IMPLANT
PAD ARMBOARD 7.5X6 YLW CONV (MISCELLANEOUS) ×6 IMPLANT
SET HNDPC FAN SPRY TIP SCT (DISPOSABLE) ×1 IMPLANT
SOLUTION PARTIC MCRMTRX 500MG (Tissue) ×1 IMPLANT
SPONGE LAP 18X18 X RAY DECT (DISPOSABLE) IMPLANT
STAPLER VISISTAT 35W (STAPLE) IMPLANT
STRAP MONTGOMERY 1.25X11-1/8 (MISCELLANEOUS) IMPLANT
SUT ETHILON 3 0 FSL (SUTURE) IMPLANT
SUT STEEL 6MS V (SUTURE) IMPLANT
SUT STEEL STERNAL CCS#1 18IN (SUTURE) IMPLANT
SUT STEEL SZ 6 DBL 3X14 BALL (SUTURE) IMPLANT
SUT VIC AB 1 CTX 36 (SUTURE)
SUT VIC AB 1 CTX36XBRD ANBCTR (SUTURE) IMPLANT
SUT VIC AB 2-0 CTX 27 (SUTURE) IMPLANT
SUT VIC AB 2-0 CTX 36 (SUTURE) IMPLANT
SUT VIC AB 3-0 SH 27 (SUTURE)
SUT VIC AB 3-0 SH 27X BRD (SUTURE) IMPLANT
SUT VIC AB 3-0 SH 8-18 (SUTURE) ×3 IMPLANT
SUT VIC AB 3-0 X1 27 (SUTURE) IMPLANT
SWAB COLLECTION DEVICE MRSA (MISCELLANEOUS) IMPLANT
SWAB CULTURE ESWAB REG 1ML (MISCELLANEOUS) IMPLANT
SYR 5ML LL (SYRINGE) IMPLANT
TOWEL GREEN STERILE (TOWEL DISPOSABLE) IMPLANT
TOWEL GREEN STERILE FF (TOWEL DISPOSABLE) ×3 IMPLANT
TRAY FOLEY W/METER SILVER 16FR (SET/KITS/TRAYS/PACK) IMPLANT
WATER STERILE IRR 1000ML POUR (IV SOLUTION) ×3 IMPLANT

## 2017-06-15 NOTE — Progress Notes (Signed)
Day of Surgery Procedure(s) (LRB): IRRIGATION OF RIGHT STERNOCLAVICULAR WOUND (Right) WOUND VAC CHANGE (Right) Subjective: Wound irrigated, acell powder applied and VAC changed Wound is getting cleaner WBC now WNL Wound remains too deep and close to major vascular structures for home VAC changes.  Cont VAC change M-W-F starting Monday 1-28  Objective: Vital signs in last 24 hours: Temp:  [98.4 F (36.9 C)-99.3 F (37.4 C)] 98.4 F (36.9 C) (01/24 0514) Pulse Rate:  [74-87] 79 (01/24 0514) Resp:  [16-18] 18 (01/24 0514) BP: (96-109)/(51-64) 99/64 (01/24 0514) SpO2:  [96 %-97 %] 97 % (01/24 0514)  Hemodynamic parameters for last 24 hours:  afebrile  Intake/Output from previous day: 01/23 0701 - 01/24 0700 In: 2120 [P.O.:720; IV Piggyback:1400] Out: -  Intake/Output this shift: Total I/O In: 700 [I.V.:700] Out: 20 [Blood:20]       Exam    General- alert and comfortable in PACU after wound vac change    Neck- no JVD, no cervical adenopathy palpable, no carotid bruit. VAC in place on suction   Lungs- clear without rales, wheezes   Cor- regular rate and rhythm, no murmur , gallop   Abdomen- soft, non-tender   Extremities - warm, non-tender, minimal edema   Neuro- oriented, appropriate, no focal weakness   Lab Results: Recent Labs    06/15/17 0534  WBC 10.1  HGB 11.8*  HCT 36.5*  PLT 809*   BMET:  Recent Labs    06/14/17 0810 06/15/17 0534  NA 136 138  K 4.0 4.3  CL 100* 103  CO2 25 25  GLUCOSE 96 86  BUN 8 8  CREATININE 0.62 0.60*  CALCIUM 9.3 9.0    PT/INR: No results for input(s): LABPROT, INR in the last 72 hours. ABG    Component Value Date/Time   PHART 7.430 06/07/2017 0940   HCO3 27.5 06/07/2017 0940   TCO2 29 06/05/2017 1457   ACIDBASEDEF 4.0 (H) 03/13/2007 0120   O2SAT 90.2 06/07/2017 0940   CBG (last 3)  No results for input(s): GLUCAP in the last 72 hours.  Assessment/Plan: S/P Procedure(s) (LRB): IRRIGATION OF RIGHT  STERNOCLAVICULAR WOUND (Right) WOUND VAC CHANGE (Right) Cont iv ancef and vac sponge changes in hospital   LOS: 10 days    Clifford Tucker 06/15/2017

## 2017-06-15 NOTE — Brief Op Note (Signed)
06/15/2017  8:51 AM  PATIENT:  Clifford Tucker  37 y.o. male  PRE-OPERATIVE DIAGNOSIS:  RIGHT STERNOCLAVICULAR JOINT INFECTION  POST-OPERATIVE DIAGNOSIS:  same diagnosis   PROCEDURE:  Procedure(s): IRRIGATION OF RIGHT STERNOCLAVICULAR WOUND (Right) WOUND VAC CHANGE (Right) Application of 500 g acell powder  SURGEON:  Surgeon(s) and Role:    Kerin Perna* Van Trigt, Peter, MD - Primary  PHYSICIAN ASSISTANT:   ASSISTANTS: none   ANESTHESIA:   general  EBL:  20 mL   BLOOD ADMINISTERED:none  DRAINS: none   LOCAL MEDICATIONS USED:  NONE  SPECIMEN:  No Specimen  DISPOSITION OF SPECIMEN:  N/A  COUNTS:  YES  TOURNIQUET:  * No tourniquets in log *  DICTATION: .Dragon Dictation  PLAN OF CARE: return to [address]  PATIENT DISPOSITION:  PACU - hemodynamically stable.   Delay start of Pharmacological VTE agent (>24hrs) due to surgical blood loss or risk of bleeding: yes

## 2017-06-15 NOTE — Progress Notes (Signed)
Pre Procedure note for inpatients:   Clifford BoschChristopher Tucker has been scheduled for Procedure(s): IRRIGATION OF RIGHT STERNOCLAVICULAR WOUND (Right) WOUND VAC CHANGE (Right) today. The various methods of treatment have been discussed with the patient. After consideration of the risks, benefits and treatment options the patient has consented to the planned procedure.   The patient has been seen and labs reviewed. There are no changes in the patient's condition to prevent proceeding with the planned procedure today.  Recent labs:  Lab Results  Component Value Date   WBC 10.1 06/15/2017   HGB 11.8 (L) 06/15/2017   HCT 36.5 (L) 06/15/2017   PLT 809 (H) 06/15/2017   GLUCOSE 86 06/15/2017   ALT 36 06/09/2017   AST 42 (H) 06/09/2017   NA 138 06/15/2017   K 4.3 06/15/2017   CL 103 06/15/2017   CREATININE 0.60 (L) 06/15/2017   BUN 8 06/15/2017   CO2 25 06/15/2017   INR 1.11 06/07/2017    Mikey BussingPeter Van Trigt III, MD 06/15/2017 7:46 AM

## 2017-06-15 NOTE — Consult Note (Signed)
WOC follow-up: Pt went to the OR today for Vac dressing change and application of A-cell, according to the EMR.  Dr Morton PetersVan Tright requests:  Cont VAC change M-W-F starting Monday 1-28.  WOC team will plan to perform dressing change on Mon as requested. Cammie Mcgeeawn Kaavya Puskarich MSN, RN, CWOCN, [place] [facility]AP, CNS [phone]

## 2017-06-15 NOTE — Anesthesia Procedure Notes (Signed)
Procedure Name: Intubation Date/Time: 06/15/2017 7:58 AM Performed by: Nils PyleBell, Tangy Drozdowski T, CRNA Pre-anesthesia Checklist: Patient identified, Emergency Drugs available, Suction available and Patient being monitored Patient Re-evaluated:Patient Re-evaluated prior to induction Oxygen Delivery Method: Circle System Utilized Preoxygenation: Pre-oxygenation with 100% oxygen Induction Type: IV induction Ventilation: Mask ventilation without difficulty Laryngoscope Size: Glidescope and 4 Grade View: Grade I Tube type: Oral Tube size: 7.5 mm Number of attempts: 1 Airway Equipment and Method: Stylet and Oral airway Placement Confirmation: ETT inserted through vocal cords under direct vision,  positive ETCO2 and breath sounds checked- equal and bilateral Secured at: 23 cm Tube secured with: Tape Dental Injury: Teeth and Oropharynx as per pre-operative assessment

## 2017-06-15 NOTE — Progress Notes (Signed)
[facility]                                                                              Patient Demographics  Clifford Tucker, is a 37 y.o. male, DOB - Oct 31, 1980, ZOX:[id number]  Admit date - 06/05/2017   Admitting Physician Briscoe Deutscher, MD  Outpatient Primary MD for the patient is Patient, No Pcp Per  Outpatient specialists:   LOS - 10  days    Subjective:    Medical records reviewed and are as summarized below:  Patient was seen and examined this a.m., stable.  status post I&D of neck abscess, and wound VAC placement   Chief Complaint  Patient presents with  . Oral Swelling       Brief summary   Clifford Tucker is a 37 y.o. malewith medical history significant forIV drug abuse and recurrent skin and soft tissue infections. He presented with neck/hip pain found to have a neck abscess, septic arthritis of the hip and now MSSA bacteremia. CT surgery, orthopedic surgery and infectious disease consulted.   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  Reported having history of multiple abscesses over the last few weeks -Patient was originally placed on vancomycin and Zosyn, transition to IV Ancef -Sternal aspirate showed MSSA, left hip wound cultures also MSSA -2D echo with no vegetation.  ID following, recommended continue cefazolin -June 15, 2017 :status post irrigation of the right sternoclavicular wound, with a wound VAC placement  -Cont IV ancef for 6 weeks through March 1st. SW working on SNF placement, difficult placement due to drug use.   Active Problems:   Multifocal pneumonia -ID following, continue cefazolin  Left hip abscess/iliopsoas septic arthritis, infectious myositis -Status post left hip I&D with cultures done on 1/15, showing staph  aureus -Continue Ancef for 6 weeks through March 1st -Status post drain placement 1/18 for iliacus abscess, IV following closely, draining well, recommended needs CT when output is less than 10 mL per day. Will follow as outpatient if patient is discharged with drain -Doppler ultrasound of the lower extremity, left negative for any DVT  Septic arthritis of the sternoclavicular joint, abscess -CTVS following, underwent debridement with wound VAC of the neck abscess - Continue current VAC change schedule and antibiotics    IV drug user -Per patient, has been using heroin, did detox at [facility] but relapsed again in a month before the admission -Last heroin use a week ago PTA, so far not in any withdrawals -Social work consulted for rehab/SNF placement   Nicotine abuse -Nicotine patch placed   Code Status: Full code DVT Prophylaxis: Lovenox Family Communication: Discussed in detail with the patient, all imaging results, lab results explained to the patient    Disposition Plan: Social work assisting on skilled nursing facility  Time Spent in minutes   15 minutes  Procedures:  I&D left hip 1/15  Consultants:   Orthopedics CT VS ID IR  Antimicrobials:      Medications  Scheduled Meds: . enoxaparin (LOVENOX) injection  40 mg Subcutaneous Q24H  . iopamidol  20 mL Intra-articular Once  . lidocaine (PF)  5 mL Intradermal Once  . nicotine  21 mg Transdermal Daily  . pantoprazole  40 mg Oral Q0600  . sodium chloride  10 mL Intravenous Once  . sodium chloride flush  5 mL Intravenous Q8H   Continuous Infusions: . sodium chloride    .  ceFAZolin (ANCEF) IV Stopped (06/15/17 1100)  . dextrose 5 % and 0.45% NaCl 30 mL/hr at 06/07/17 2000  . lactated ringers 10 mL/hr at 06/06/17 1406  . lactated ringers 10 mL/hr at 06/07/17 1354  . potassium chloride     PRN Meds:.acetaminophen **OR** acetaminophen, alum & mag hydroxide-simeth, diphenhydrAMINE, HYDROmorphone  (DILAUDID) injection, metoCLOPramide **OR** metoCLOPramide (REGLAN) injection, ondansetron **OR** ondansetron (ZOFRAN) IV, oxyCODONE, potassium chloride, senna-docusate, zolpidem   Antibiotics   Anti-infectives (From admission, onward)   Start     Dose/Rate Route Frequency Ordered Stop   06/15/17 0816  vancomycin (VANCOCIN) powder  Status:  Discontinued       As needed 06/15/17 0817 06/15/17 0854   06/07/17 1400  cefUROXime (ZINACEF) 1.5 g in dextrose 5 % 50 mL IVPB     1.5 g 100 mL/hr over 30 Minutes Intravenous To [facility] 06/07/17 0125 06/07/17 1527   06/07/17 0745  vancomycin (VANCOCIN) 1,000 mg in sodium chloride 0.9 % 1,000 mL irrigation      Irrigation To Surgery 06/07/17 0744 06/07/17 1513   06/06/17 1639  gentamicin (GARAMYCIN) injection  Status:  Discontinued       As needed 06/06/17 1640 06/06/17 1715   06/06/17 1638  vancomycin (VANCOCIN) powder  Status:  Discontinued       As needed 06/06/17 1639 06/06/17 1715   06/06/17 1000  ceFAZolin (ANCEF) IVPB 2g/100 mL premix     2 g 200 mL/hr over 30 Minutes Intravenous Every 8 hours 06/06/17 0911     06/06/17 0000  vancomycin (VANCOCIN) IVPB 1000 mg/200 mL premix  Status:  Discontinued     1,000 mg 200 mL/hr over 60 Minutes Intravenous Every 8 hours 06/05/17 2211 06/06/17 0911   06/05/17 2330  piperacillin-tazobactam (ZOSYN) IVPB 3.375 g  Status:  Discontinued     3.375 g 12.5 mL/hr over 240 Minutes Intravenous Every 8 hours 06/05/17 2211 06/06/17 0911   06/05/17 2200  cefTRIAXone (ROCEPHIN) 1 g in dextrose 5 % 50 mL IVPB  Status:  Discontinued     1 g 100 mL/hr over 30 Minutes Intravenous Every 24 hours 06/05/17 2140 06/05/17 2202   06/05/17 2200  azithromycin (ZITHROMAX) 500 mg in dextrose 5 % 250 mL IVPB  Status:  Discontinued     500 mg 250 mL/hr over 60 Minutes Intravenous Every 24 hours 06/05/17 2140 06/06/17 0911   06/05/17 1600  vancomycin (VANCOCIN) 1,500 mg in sodium chloride 0.9 % 500 mL IVPB     1,500  mg 250 mL/hr over 120 Minutes Intravenous  Once 06/05/17 1542 06/05/17 1855   06/05/17 1515  piperacillin-tazobactam (ZOSYN) IVPB 3.375 g     3.375 g 100 mL/hr over 30 Minutes Intravenous  Once 06/05/17 1512 06/05/17 1557        Objective:   Vitals:   06/15/17 0915 06/15/17 0930 06/15/17 0938 06/15/17 0949  BP: 110/69 106/60 (!) 105/58 108/63  Pulse: 90 84 83 82  Resp: 14 12 12 15   Temp:   99 F (37.2  C) 98.2 F (36.8 C)  TempSrc:    Oral  SpO2: 97% 94% 95% 94%  Weight:      Height:        Intake/Output Summary (Last 24 hours) at 06/15/2017 1441 Last data filed at 06/15/2017 1314 Gross per 24 hour  Intake 3375 ml  Output 20 ml  Net 3355 ml     Wt Readings from Last 3 Encounters:  06/06/17 70.3 kg (154 lb 15.7 oz)  10/03/12 72.6 kg (160 lb)     Exam   General: Alert and oriented x 3, NAD  Eyes:   HEENT:  wound VAC +  Cardiovascular: S1 S2 clear, RRR. No pedal edema b/l  Respiratory: CTA B  Gastrointestinal: Soft, nontender, nondistended, + bowel sounds  Ext: no pedal edema bilaterally  Neuro: no new deficit  Musculoskeletal: No digital cyanosis, clubbing  Skin: No rashes  Psych: Normal affect and demeanor, alert and oriented x3    Data Reviewed:  I have personally reviewed following labs and imaging studies  Micro Results   OXACILLIN 0.5 SENSITIVE Sensitive     TETRACYCLINE <=1 SENSITIVE Sensitive     VANCOMYCIN 1 SENSITIVE Sensitive     TRIMETH/SULFA <=10 SENSITIVE Sensitive     CLINDAMYCIN <=0.25 SENSITIVE Sensitive     RIFAMPIN <=0.5 SENSITIVE Sensitive     Inducible Clindamycin NEGATIVE Sensitive     * MODERATE STAPHYLOCOCCUS AUREUS  Aerobic/Anaerobic Culture (surgical/deep wound)     Status: None   Collection Time: 06/09/17  6:24 PM  Result Value Ref Range Status   Specimen Description ABSCESS LEFT ILIACUS  Final   Special Requests NONE  Final   Gram Stain   Final    ABUNDANT WBC PRESENT, PREDOMINANTLY PMN NO ORGANISMS SEEN     Culture   Final    FEW STAPHYLOCOCCUS AUREUS SUSCEPTIBILITIES PERFORMED ON PREVIOUS CULTURE WITHIN THE LAST 5 DAYS. NO ANAEROBES ISOLATED    Report Status 06/15/2017 FINAL  Final    Radiology Reports DCt Abdomen Pelvis W Contrast  Result Date: 06/14/2017  CLINICAL DATA:  37 year old male IV drug abuser with multifocal abscesses status post percutaneous drainage of an abscess in the left iliac fossa. EXAM: CT ABDOMEN AND PELVIS WITH CONTRAST TECHNIQUE: Multidetector CT imaging of the abdomen and pelvis was performed using the standard protocol following bolus administration of intravenous contrast. CONTRASmuscle belly of the tensor fascia lata muscle on the left. Musculoskeletal: No acute or significant osseous findings. IMPRESSION: 1. Interval resolution of left iliacus abscess. The drainage catheter remains in good position. Electronically Signed   By: Malachy Moan M.D.   On: 06/14/18 Result Date: 06/07/2017 CLINICAL DATA:  Shortness of breath, post debridement of RIGHT sternoclavicular abscess EXAM: PORTABLE CHEST 1 VIEW COMPARISON:  Portable exam 1639 hours compared to 06/05/2017 FINDINGS: Normal heart size, mediastinal contours, and pulmonary vascularity. Mild bronchitic changes. Question mild atelectasis versus infiltrate at medial RIGHT lung base. No additional infiltrate, pleural effusion or pneumothorax. No acute osseous findings. IMPRESSION: Mild chronic bronchitic changes with question minimal atelectasis versus infiltrate at medial RIGHT lung base. Electronically Signed   By: Ulyses Southward M.D.   On: 06/07/2017 17:47   Ct Image Guided Drainage By Percutaneous Catheter  Result Date: 06/09/2017 INDICATION: 37 year old male IV drug user with left iliacus abscess. EXAM: CT GUIDED DRAINAGE OF  ABSCESS MEDICATIONS: The patient is currently admitted to the hospital and receiving intravenous antibiotics. The antibiotics were administered within an appropriate time frame s cavity. FINDINGS: Left  iliacus abscess.  IMPRESSION: Successful placement of a 12 French drainage catheter into the left iliacus abscess. PLAN: Maintain drain to JP bulb suction.  Flush every shift. When drain output is less than 10 mL per day for 2 consecutive days, the drain may be removed. Electronically Signed   By: Malachy MoanHeath  McCullough M.D.   On: 06/09/2017 18:59    Lab Data:  CBC: Recent Labs  Lab 06/09/17 1237 06/15/17 0534  WBC 19.4* 10.1  HGB 12.2* 11.8*  HCT 36.4* 36.5*  MCV 89.4 92.4  PLT 500* 809*   Basic Metabolic Panel: Recent Labs  Lab 06/11/17 0915 06/12/17 0718 06/13/17 0527 06/14/17 0810 06/15/17 0534  NA 136 140 135 136 138  K 4.0 4.1 3.7 4.0 4.3  CL 103 103 100* 100* 103  CO2 22 26 24 25 25   GLUCOSE 105* 105* 92 96 86  BUN 7 10 10 8 8   CREATININE 0.56* 0.53* 0.48* 0.62 0.60*  CALCIUM 8.8* 9.1 9.0 9.3 9.0   GFR: Estimated Creatinine Clearance: 128.2 mL/min (A) (by C-G formula based on SCr of 0.6 mg/dL (L)). Liver Function Tests: Recent Labs  Lab 06/09/17 1053  AST 42*  ALT 36  ALKPHOS 165*  BILITOT 0.6  PROT 6.6  ALBUMIN 2.6*   Urine analysis:    Component Value Date/Time   COLORURINE YELLOW 06/06/2017 1333   APPEARANCEUR CLEAR 06/06/2017 1333   LABSPEC 1.010 06/06/2017 1333   PHURINE 6.0 06/06/2017 1333   GLUCOSEU NEGATIVE 06/06/2017 1333   HGBUR NEGATIVE 06/06/2017 1333   BILIRUBINUR NEGATIVE 06/06/2017 1333   KETONESUR NEGATIVE 06/06/2017 1333   PROTEINUR NEGATIVE 06/06/2017 1333   UROBILINOGEN 0.2 10/03/2012 0856   NITRITE NEGATIVE 06/06/2017 1333   LEUKOCYTESUR NEGATIVE 06/06/2017 1333     Chancy Smigiel A Adarsh Mundorf M.D. [facility] 06/15/2017, 2:41 PM  Pager: [phone] Between 7am to 7pm - call Pager - [phone] After 7pm go to [email or web address] - password [email or web address] Call night coverage person covering after 7pm

## 2017-06-15 NOTE — Transfer of Care (Signed)
Immediate Anesthesia Transfer of Care Note  Patient: Clifford Tucker  Procedure(s) Performed: IRRIGATION OF RIGHT STERNOCLAVICULAR WOUND (Right ) WOUND VAC CHANGE (Right )  Patient Location: PACU  Anesthesia Type:General  Level of Consciousness: awake, alert  and oriented  Airway & Oxygen Therapy: Patient Spontanous Breathing  Post-op Assessment: Report given to RN, Post -op Vital signs reviewed and stable and Patient moving all extremities X 4  Post vital signs: Reviewed and stable  Last Vitals:  Vitals:   06/14/17 2126 06/15/17 0514  BP: 109/60 99/64  Pulse: 74 79  Resp: 18 18  Temp: 37.4 C 36.9 C  SpO2: 96% 97%    Last Pain:  Vitals:   06/15/17 0542  TempSrc:   PainSc: 2       Patients Stated Pain Goal: 3 (06/12/17 0600)  Complications: No apparent anesthesia complications

## 2017-06-15 NOTE — Consult Note (Signed)
    I was asked to see this patient by Rexene AlbertsStephanie Dixon with ID for consideration of medication assisted treatment of severe opioid use disorder. The patient is a 37 year old person who uses injection heroin admitted 1/14 with multiple complications of injection drug use including MSSA bacteremia, septic arthritis of the right sternoclavicular joint, left hip septic arthritis, and iliopsoas myositis. He is recovering well from these infections and subsequent surgeries in the hospital on IV antibiotics. We talked about his history of drug use. He was with his fiancee who told me that she is currently on suboxone maintenance treatment for OUD through her psychiatrist. Clifford Tucker did a 7 day treatment course with suboxone at [facility] many months ago. After that he did not establish with a suboxone provider, rather he treated himself by purchasing illicit suboxone and would take about an 8mg  film daily. He reports doing well for many months that way, but then he lost access to the suboxone. When he came off MAT he relapsed for about one month prior to this admission.   He strongly desires MAT in the future. He recognizes that he is likely to relapse again without it. His biggest fear of restarting suboxone is the moderate withdrawal that is required when transition from short acting opioids to suboxone. We talked about methadone as a possibility, but he is more afraid of the side effects of methadone. He still has a few acute pain generators and is receiving oxycodone and IV dilaudid throughout the day for that pain. He thinks his pain is too severe right now to switch. ID is recommending 6 weeks of antibiotics, he tells me he is unlikely to tolerate that long of a period in the hospital or a facility. I will plan to re-evaluate him tomorrow. If pain is improved, we can make the switch from short acting opioids to suboxone. [facility] and continue MAT indefinitely.    Clifford Tucker, Clifford Thomas, MD 06/15/2017, 3:12 PM

## 2017-06-15 NOTE — Anesthesia Postprocedure Evaluation (Signed)
Anesthesia Post Note  Patient: Clifford BoschChristopher Lockridge  Procedure(s) Performed: IRRIGATION OF RIGHT STERNOCLAVICULAR WOUND (Right ) WOUND VAC CHANGE (Right )     Patient location during evaluation: PACU Anesthesia Type: General Level of consciousness: sedated and patient cooperative Pain management: pain level controlled Vital Signs Assessment: post-procedure vital signs reviewed and stable Respiratory status: spontaneous breathing Cardiovascular status: stable Anesthetic complications: no    Last Vitals:  Vitals:   06/15/17 0949 06/15/17 1450  BP: 108/63 126/71  Pulse: 82 90  Resp: 15 18  Temp: 36.8 C 37.1 C  SpO2: 94% 95%    Last Pain:  Vitals:   06/15/17 1450  TempSrc: Oral  PainSc:                  Lewie LoronJohn Shenouda Genova

## 2017-06-16 ENCOUNTER — Encounter (HOSPITAL_COMMUNITY): Payer: Self-pay | Admitting: Cardiothoracic Surgery

## 2017-06-16 LAB — BASIC METABOLIC PANEL
Anion gap: 13 (ref 5–15)
BUN: 10 mg/dL (ref 6–20)
CO2: 25 mmol/L (ref 22–32)
Calcium: 9.7 mg/dL (ref 8.9–10.3)
Chloride: 101 mmol/L (ref 101–111)
Creatinine, Ser: 0.53 mg/dL — ABNORMAL LOW (ref 0.61–1.24)
GFR calc Af Amer: >60 mL/min (ref >60)
GFR calc non Af Amer: >60 mL/min (ref >60)
Glucose, Bld: 111 mg/dL — ABNORMAL HIGH (ref 65–99)
Potassium: 4.7 mmol/L (ref 3.5–5.1)
Sodium: 139 mmol/L (ref 135–145)

## 2017-06-16 MED ORDER — DIPHENHYDRAMINE HCL 25 MG PO CAPS
25.0000 mg | ORAL_CAPSULE | Freq: Three times a day (TID) | ORAL | Status: DC | PRN
  Administered 2017-06-17 – 2017-07-21 (×17): 25 mg via ORAL
  Filled 2017-06-16 (×18): qty 1

## 2017-06-16 MED ORDER — GABAPENTIN 600 MG PO TABS
300.0000 mg | ORAL_TABLET | Freq: Three times a day (TID) | ORAL | Status: DC
  Administered 2017-06-16 – 2017-08-02 (×139): 300 mg via ORAL
  Filled 2017-06-16 (×139): qty 1

## 2017-06-16 MED ORDER — ZINC SULFATE 220 (50 ZN) MG PO CAPS
220.0000 mg | ORAL_CAPSULE | Freq: Every day | ORAL | Status: DC
  Administered 2017-06-16 – 2017-08-02 (×47): 220 mg via ORAL
  Filled 2017-06-16 (×47): qty 1

## 2017-06-16 MED ORDER — HYDROMORPHONE HCL 1 MG/ML IJ SOLN
1.0000 mg | INTRAMUSCULAR | Status: DC | PRN
  Administered 2017-06-16 – 2017-06-23 (×39): 1 mg via INTRAVENOUS
  Filled 2017-06-16 (×42): qty 1

## 2017-06-16 NOTE — Progress Notes (Signed)
[facility]                                                                              Patient Demographics  Clifford Tucker, is a 37 y.o. male, DOB - [birth year], WUJ:[id number]  Admit date - 06/05/2017   Admitting Physician Briscoe Deutscher, MD  Outpatient Primary MD for the patient is Patient, No Pcp Per  Outpatient specialists:   LOS - 11  days    Subjective:    Patient was seen and examined this morning, comfortable, walking the hallway, no acute distress.  Wound VAC in place at the sternal area, draining adequately.  Family present at bedside, he understands regarding his expresses understanding regarding his addiction behavior, wanting to participate as de-escalating narcotics and detox.    Chief Complaint  Patient presents with  . Oral Swelling       Brief summary   Clifford Tucker is a 37 y.o. malewith medical history significant forIV drug abuse and recurrent skin and soft tissue infections. He presented with neck/hip pain found to have a neck abscess, septic arthritis of the hip and now MSSA bacteremia. CT surgery, orthopedic surgery and infectious disease consulted.   Assessment & Plan      Bacteremia due to methicillin susceptible Staphylococcus aureus (MSSA) in the setting of IV drug use -stable, no changes, afebrile, normotensive.  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  Reported having history of multiple abscesses over the last few weeks -Patient was originally placed on vancomycin and Zosyn, transition to IV Ancef -Sternal aspirate showed MSSA, left hip wound cultures also MSSA -2D echo with no vegetation.  ID following, recommended continue cefazolin  -June 15, 2017 :status post irrigation of the right sternoclavicular wound, with a wound VAC placement -Anticipating reevaluation of wound VAC and further irrigation and cleaning next week. Cardiothoracic surgery  team recommending wound VAC change Mondays, Wednesdays, Fridays.  Starting Monday, January 28, continue antibiotics  Continue wound vac changes M,W,F starting Monday 1-28. Wound vac with good     -Cont IV ancef for 6 weeks through March 1st. SW working on SNF placement, difficult placement due to drug use.   Active Problems:   Multifocal pneumonia -ID following, continue cefazolin  Left hip abscess/iliopsoas septic arthritis, infectious myositis -Status post left hip I&D with cultures done on 1/15, showing staph aureus -Continue Ancef for 6 weeks through March 1st -Status post drain placement 1/18 for iliacus abscess, IV following closely, draining well, recommended needs CT when output is less than 10 mL per day. Will follow as outpatient if patient is discharged with drain -Doppler ultrasound of the lower extremity, left negative for any DVT  Septic arthritis of the sternoclavicular joint, abscess -CTVS following, underwent debridement with wound VAC of the neck abscess - Continue current VAC change schedule and antibiotics    IV drug user -Per patient, has been using heroin, [facility] but relapsed again in a month before the admission -Last heroin use a week ago PTA, so far not in any withdrawals -Social work consulted for  rehab/SNF placement   Nicotine abuse -Nicotine patch placed   Code Status: Full code DVT Prophylaxis: Lovenox Family Communication: Discussed with the patient, male family at bedside all the findings plan of care discharge.  They expressed understanding the need of continue antibiotics per ID recommendation for next 6 weeks.  Disposition Plan:   Social work consulted for rehab/SNF placement   Time Spent in minutes   25 minutes  Procedures:  I&D left hip 1/15  Consultants:   Orthopedics CT VS ID IR  Antimicrobials:      Medications  Scheduled Meds: . enoxaparin (LOVENOX) injection  40 mg Subcutaneous Q24H  .  gabapentin  300 mg Oral TID  . iopamidol  20 mL Intra-articular Once  . lidocaine (PF)  5 mL Intradermal Once  . nicotine  21 mg Transdermal Daily  . zinc sulfate  220 mg Oral Daily   Continuous Infusions: . sodium chloride    .  ceFAZolin (ANCEF) IV Stopped (06/16/17 1157)  . lactated ringers 10 mL/hr at 06/06/17 1406  . lactated ringers 10 mL/hr at 06/07/17 1354  . potassium chloride     PRN Meds:.acetaminophen **OR** acetaminophen, alum & mag hydroxide-simeth, diphenhydrAMINE, HYDROmorphone (DILAUDID) injection, ondansetron **OR** ondansetron (ZOFRAN) IV, oxyCODONE, potassium chloride, senna-docusate, zolpidem   Antibiotics   Anti-infectives (From admission, onward)   Start     Dose/Rate Route Frequency Ordered Stop   06/15/17 0816  vancomycin (VANCOCIN) powder  Status:  Discontinued       As needed 06/15/17 0817 06/15/17 0854   06/07/17 1400  cefUROXime (ZINACEF) 1.5 g in dextrose 5 % 50 mL IVPB     1.5 g 100 mL/hr over 30 Minutes Intravenous To ShortStay Surgical 06/07/17 0125 06/07/17 1527   06/07/17 0745  vancomycin (VANCOCIN) 1,000 mg in sodium chloride 0.9 % 1,000 mL irrigation      Irrigation To Surgery 06/07/17 0744 06/07/17 1513   06/06/17 1639  gentamicin (GARAMYCIN) injection  Status:  Discontinued       As needed 06/06/17 1640 06/06/17 1715   06/06/17 1638  vancomycin (VANCOCIN) powder  Status:  Discontinued       As needed 06/06/17 1639 06/06/17 1715   06/06/17 1000  ceFAZolin (ANCEF) IVPB 2g/100 mL premix     2 g 200 mL/hr over 30 Minutes Intravenous Every 8 hours 06/06/17 0911     06/06/17 0000  vancomycin (VANCOCIN) IVPB 1000 mg/200 mL premix  Status:  Discontinued     1,000 mg 200 mL/hr over 60 Minutes Intravenous Every 8 hours 06/05/17 2211 06/06/17 0911   06/05/17 2330  piperacillin-tazobactam (ZOSYN) IVPB 3.375 g  Status:  Discontinued     3.375 g 12.5 mL/hr over 240 Minutes Intravenous Every 8 hours 06/05/17 2211 06/06/17 0911   06/05/17 2200   cefTRIAXone (ROCEPHIN) 1 g in dextrose 5 % 50 mL IVPB  Status:  Discontinued     1 g 100 mL/hr over 30 Minutes Intravenous Every 24 hours 06/05/17 2140 06/05/17 2202   06/05/17 2200  azithromycin (ZITHROMAX) 500 mg in dextrose 5 % 250 mL IVPB  Status:  Discontinued     500 mg 250 mL/hr over 60 Minutes Intravenous Every 24 hours 06/05/17 2140 06/06/17 0911   06/05/17 1600  vancomycin (VANCOCIN) 1,500 mg in sodium chloride 0.9 % 500 mL IVPB     1,500 mg 250 mL/hr over 120 Minutes Intravenous  Once 06/05/17 1542 06/05/17 1855   06/05/17 1515  piperacillin-tazobactam (ZOSYN) IVPB 3.375 g  3.375 g 100 mL/hr over 30 Minutes Intravenous  Once 06/05/17 1512 06/05/17 1557        Objective:   Vitals:   06/15/17 0949 06/15/17 1450 06/15/17 2224 06/16/17 0202  BP: 108/63 126/71 131/74 128/61  Pulse: 82 90 78 80  Resp: 15 18 17 16   Temp: 98.2 F (36.8 C) 98.8 F (37.1 C) 98.4 F (36.9 C) 98.2 F (36.8 C)  TempSrc: Oral Oral Oral Oral  SpO2: 94% 95% 96% 98%  Weight:      Height:        Intake/Output Summary (Last 24 hours) at 06/16/2017 1159 Last data filed at 06/16/2017 0203 Gross per 24 hour  Intake 1000 ml  Output -  Net 1000 ml     Wt Readings from Last 3 Encounters:  06/06/17 70.3 kg (154 lb 15.7 oz)  10/03/12 72.6 kg (160 lb)     Exam BP 128/61 (BP Location: Right Arm)   Pulse 80   Temp 98.2 F (36.8 C) (Oral)   Resp 16   Ht 5\' 10"  (1.778 m)   Wt 70.3 kg (154 lb 15.7 oz)   SpO2 98%   BMI 22.24 kg/m    Physical Exam  Constitution:  Alert, cooperative, no distress, appears stated age HEET: Normocephalic, PERRL, otherwise with in Normal limits Neck ; supple, superior sternal wound VAC in place Vascular:  S1/S2, RRR, No murmure, No Rubs or Gallops  Chest/pulmonary: Symmetric, clear to auscultation bilaterally, respirations unlabored  Chest symmetric Abdomen: Soft, non-tender, non-distended, bowel sounds,no masses, no organomegaly Muscular skeletal: Limited  exam - in bed, able to move all 4 extremities, Normal strength,  Extremities: No pitting edema lower extremities, +2 pulses  Neuro: CNII-XII intact. , normal motor and sensation, reflexes intact  Skin: Dry, warm to touch, negative for any Rashes, No open wounds Psychiatric: Normal and stable mood and affect, cognition intact,      Data Reviewed:  I have personally reviewed following labs and imaging studies  Micro Results   OXACILLIN 0.5 SENSITIVE Sensitive     TETRACYCLINE <=1 SENSITIVE Sensitive     VANCOMYCIN 1 SENSITIVE Sensitive     TRIMETH/SULFA <=10 SENSITIVE Sensitive     CLINDAMYCIN <=0.25 SENSITIVE Sensitive     RIFAMPIN <=0.5 SENSITIVE Sensitive     Inducible Clindamycin NEGATIVE Sensitive     * MODERATE STAPHYLOCOCCUS AUREUS  Aerobic/Anaerobic Culture (surgical/deep wound)     Status: None   Collection Time: 06/09/17  6:24 PM  Result Value Ref Range Status   Specimen Description ABSCESS LEFT ILIACUS  Final   Special Requests NONE  Final   Gram Stain   Final    ABUNDANT WBC PRESENT, PREDOMINANTLY PMN NO ORGANISMS SEEN    Culture   Final    FEW STAPHYLOCOCCUS AUREUS SUSCEPTIBILITIES PERFORMED ON PREVIOUS CULTURE WITHIN THE LAST 5 DAYS. NO ANAEROBES ISOLATED    Report Status 06/15/2017 FINAL  Final    Radiology Reports DCt Abdomen Pelvis W Contrast  Result Date: 06/14/2017  CLINICAL DATA:  38 year old male IV drug abuser with multifocal abscesses status post percutaneous drainage of an abscess in the left iliac fossa. EXAM: CT ABDOMEN AND PELVIS WITH CONTRAST TECHNIQUE: Multidetector CT imaging of the abdomen and pelvis was performed using the standard protocol following bolus administration of intravenous contrast. CONTRASmuscle belly of the tensor fascia lata muscle on the left. Musculoskeletal: No acute or significant osseous findings. IMPRESSION: 1. Interval resolution of left iliacus abscess. The drainage catheter remains in good position. Electronically  Signed    By: Malachy MoanHeath  McCullough M.D.   On: 06/14/18 Result Date: 06/07/2017 CLINICAL DATA:  Shortness of breath, post debridement of RIGHT sternoclavicular abscess EXAM: PORTABLE CHEST 1 VIEW COMPARISON:  Portable exam 1639 hours compared to 06/05/2017 FINDINGS: Normal heart size, mediastinal contours, and pulmonary vascularity. Mild bronchitic changes. Question mild atelectasis versus infiltrate at medial RIGHT lung base. No additional infiltrate, pleural effusion or pneumothorax. No acute osseous findings. IMPRESSION: Mild chronic bronchitic changes with question minimal atelectasis versus infiltrate at medial RIGHT lung base. Electronically Signed   By: Ulyses SouthwardMark  Boles M.D.   On: 06/07/2017 17:47   Ct Image Guided Drainage By Percutaneous Catheter  Result Date: 06/09/2017 INDICATION: 37 year old male IV drug user with left iliacus abscess. EXAM: CT GUIDED DRAINAGE OF  ABSCESS MEDICATIONS: The patient is currently admitted to the hospital and receiving intravenous antibiotics. The antibiotics were administered within an appropriate time frame s cavity. FINDINGS: Left iliacus abscess.  IMPRESSION: Successful placement of a 12 French drainage catheter into the left iliacus abscess. PLAN: Maintain drain to JP bulb suction.  Flush every shift. When drain output is less than 10 mL per day for 2 consecutive days, the drain may be removed. Electronically Signed   By: Malachy MoanHeath  McCullough M.D.   On: 06/09/2017 18:59    Lab Data:  CBC: Recent Labs  Lab 06/09/17 1237 06/15/17 0534  WBC 19.4* 10.1  HGB 12.2* 11.8*  HCT 36.4* 36.5*  MCV 89.4 92.4  PLT 500* 809*   Basic Metabolic Panel: Recent Labs  Lab 06/12/17 0718 06/13/17 0527 06/14/17 0810 06/15/17 0534 06/16/17 0510  NA 140 135 136 138 139  K 4.1 3.7 4.0 4.3 4.7  CL 103 100* 100* 103 101  CO2 26 24 25 25 25   GLUCOSE 105* 92 96 86 111*  BUN 10 10 8 8 10   CREATININE 0.53* 0.48* 0.62 0.60* 0.53*  CALCIUM 9.1 9.0 9.3 9.0 9.7   GFR: Estimated Creatinine  Clearance: 128.2 mL/min (A) (by C-G formula based on SCr of 0.53 mg/dL (L)). Liver Function Tests: No results for input(s): AST, ALT, ALKPHOS, BILITOT, PROT, ALBUMIN in the last 168 hours. Urine analysis:    Component Value Date/Time   COLORURINE YELLOW 06/06/2017 1333   APPEARANCEUR CLEAR 06/06/2017 1333   LABSPEC 1.010 06/06/2017 1333   PHURINE 6.0 06/06/2017 1333   GLUCOSEU NEGATIVE 06/06/2017 1333   HGBUR NEGATIVE 06/06/2017 1333   BILIRUBINUR NEGATIVE 06/06/2017 1333   KETONESUR NEGATIVE 06/06/2017 1333   PROTEINUR NEGATIVE 06/06/2017 1333   UROBILINOGEN 0.2 10/03/2012 0856   NITRITE NEGATIVE 06/06/2017 1333   LEUKOCYTESUR NEGATIVE 06/06/2017 1333     Seyed A Shahmehdi M.D. [facility] 06/16/2017, 11:59 AM  Pager: [phone] Between 7am to 7pm - call Pager - [phone] After 7pm go to [email or web address] - password [email or web address] Call night coverage person covering after 7pm

## 2017-06-16 NOTE — Progress Notes (Signed)
1 Day Post-Op Procedure(s) (LRB): IRRIGATION OF RIGHT STERNOCLAVICULAR WOUND (Right) WOUND VAC CHANGE (Right) Subjective: Shares he is having some pain this morning but overall feels pretty good.   Objective: Vital signs in last 24 hours: Temp:  [98.2 F (36.8 C)-99.2 F (37.3 C)] 98.2 F (36.8 C) (01/25 0202) Pulse Rate:  [78-90] 80 (01/25 0202) Cardiac Rhythm: Normal sinus rhythm (01/24 0938) Resp:  [12-18] 16 (01/25 0202) BP: (105-131)/(58-77) 128/61 (01/25 0202) SpO2:  [94 %-98 %] 98 % (01/25 0202)     Intake/Output from previous day: 01/24 0701 - 01/25 0700 In: 2255 [P.O.:1480; I.V.:775] Out: 20 [Blood:20] Intake/Output this shift: No intake/output data recorded.  General appearance: alert, cooperative and no distress Heart: regular rate and rhythm, S1, S2 normal, no murmur, click, rub or gallop Lungs: clear to auscultation bilaterally Extremities: extremities normal, atraumatic, no cyanosis or edema Wound: wound vac in place with a good seal  Lab Results: Recent Labs    06/15/17 0534  WBC 10.1  HGB 11.8*  HCT 36.5*  PLT 809*   BMET:  Recent Labs    06/15/17 0534 06/16/17 0510  NA 138 139  K 4.3 4.7  CL 103 101  CO2 25 25  GLUCOSE 86 111*  BUN 8 10  CREATININE 0.60* 0.53*  CALCIUM 9.0 9.7    PT/INR: No results for input(s): LABPROT, INR in the last 72 hours. ABG    Component Value Date/Time   PHART 7.430 06/07/2017 0940   HCO3 27.5 06/07/2017 0940   TCO2 29 06/05/2017 1457   ACIDBASEDEF 4.0 (H) 03/13/2007 0120   O2SAT 90.2 06/07/2017 0940   CBG (last 3)  No results for input(s): GLUCAP in the last 72 hours.  Assessment/Plan: S/P Procedure(s) (LRB): IRRIGATION OF RIGHT STERNOCLAVICULAR WOUND (Right) WOUND VAC CHANGE (Right)  1. Continue wound vac changes M,W,F starting Monday 1-28. Wound vac with good suction 2. Continue IV Ancef, ID consulted. 3. Pain control-good control on PO Oxycodone.     LOS: 11 days    Sharlene Doryessa N  Cachet Mccutchen 06/16/2017

## 2017-06-16 NOTE — Progress Notes (Addendum)
         [facility]  Date of Admission:  06/05/2017     Reason for Visit: Follow up on MSSA bacteremia, septic arthritis of sternoclavicular joint, neck abscesses, left iliopsoas abscess.   Total days of antibiotics 12  Cefazolin day 11        Patient ID: Clifford Tucker is a 37 y.o. male with  Principal Problem:   Bacteremia due to methicillin susceptible Staphylococcus aureus (MSSA) Active Problems:   Septic arthritis of sternoclavicular joint, right (HCC)   Iliopsoas abscess on left (HCC)   Septic arthritis of hip (HCC)   Multifocal pneumonia   Neck abscess   IV drug user   Left hip pain   Normocytic anemia  Interval History / Subjective:  No changes. Stable wbc and afebrile.   . enoxaparin (LOVENOX) injection  40 mg Subcutaneous Q24H  . gabapentin  300 mg Oral TID  . iopamidol  20 mL Intra-articular Once  . lidocaine (PF)  5 mL Intradermal Once  . nicotine  21 mg Transdermal Daily  . zinc sulfate  220 mg Oral Daily   No Known Allergies  OBJECTIVE: Vitals:   06/15/17 1450 06/15/17 2224 06/16/17 0202 06/16/17 1404  BP: 126/71 131/74 128/61 118/68  Pulse: 90 78 80 79  Resp: 18 17 16 18   Temp: 98.8 F (37.1 C) 98.4 F (36.9 C) 98.2 F (36.8 C) 98.5 F (36.9 C)  TempSrc: Oral Oral Oral Oral  SpO2: 95% 96% 98% 100%  Weight:      Height:       Body mass index is 22.24 kg/m.   ASSESSMENT: MSSA bacteremia - TTE without vegetation  Sternoclavicular joint septic arthritis - s/p debridement 06/07/17 Septic arthritis of left hip - s/p debridement 06/06/17 Left Iliopsoas abscess - 06/09/17 perc drain  IVDU  In discussion with LCSW today it appears we will less likely be able to secure SNF placement considering his recent drug use and risk. While it would be ideal to provide him with 6 weeks of IV Ancef, I am not certain this will be realistic considering he is not a candidate for home therapies and SNF may not accept him.    PLAN: 1. Continue Ancef with hopes for placement and 6 weeks of treatment.  2. If this is not possible he really needs at least 2 weeks of IV therapy considering he was bacteremic with multiple areas of metastatic infection through February 1st with transition to oral regimen that offers good bone penetration like linezolid 600 mg BID. Bactrim +/- Rifampin would be an option as well with close follow up.  3. Appreciate Dr. Oswaldo DoneVincent discussing outpatient suboxone for Clifford Tucker.   Will see back on Monday - Dr. Daiva EvesVan Dam is available by phone with any ID questions.   Clifford AlbertsStephanie Alizah Sills, MSN, NP-C [facility] Cell: [phone] Pager: [phone]  06/16/2017  3:10 PM

## 2017-06-16 NOTE — Progress Notes (Signed)
Pt instructed not to disconnect IV tubing. I explained risks of infection. Pt noncompliant with request to allow nursing staff to manage IV tubing.

## 2017-06-16 NOTE — Social Work (Signed)
CSW continuing to search for placement for pt to continue iv abx and wound care. This search is complicated by pt hx of iv drug use and current health hx.   CSW continuing to follow.   Doy HutchingIsabel H Charlton Tucker, LCSWA [facility] Clinical Social Work [phone]

## 2017-06-16 NOTE — Op Note (Signed)
NAME:  Clifford Tucker, Clifford Tucker                ACCOUNT NO.:  MEDICAL RECORD NO.:  [id number]  LOCATION:                                 FACILITY:  PHYSICIAN:  Kerin PernaPeter Van Trigt, M.D.       DATE OF BIRTH:  DATE OF PROCEDURE:  06/15/2017 DATE OF DISCHARGE:                              OPERATIVE REPORT   OPERATION:  Irrigation of right sternoclavicular joint with pulse lavage, debridement (excisional), application of 500 mg of ACell powder and application of wound VAC.  ANESTHESIA:  General.  PREOPERATIVE DIAGNOSIS:  Methicillin-resistant Staphylococcus aureus infection of right sternoclavicular joint with a history of IV drug abuse.  POSTOPERATIVE DIAGNOSIS:  Methicillin-resistant Staphylococcus aureus infection of right sternoclavicular joint with a history of IV drug abuse.  DESCRIPTION OF PROCEDURE:  The patient was brought from the preop holding area after the proper site was marked and informed consent was obtained and the procedure reviewed with the patient including benefits and risks.  The patient was placed supine on the operating table and general anesthesia was induced.  The previously placed wound VAC was removed including the sponge.  The chest was prepped and draped as a sterile field.  A proper time-out was performed.  Examination of the wound revealed it to be 5 cm long x 2 cm wide x 4 cm deep.  There was increased granulation tissue.  There was no tracking or undermining. The wound was irrigated with 2 L of pulse lavage saline.  It was then irrigated with vancomycin irrigation.  The wound was excised of necrotic fatty tissue.  The wound looked with predominantly granulation tissue, so ACell powder was placed at the base of the wound.  This was covered with a section of Sorbact, which was secured to the soft tissue with interrupted 3-0 Vicryl.  A small sponge was then placed on top of the Sorbact, covered with a wound VAC dressing sheets and connected to suction.  The  patient was reversed from anesthesia and returned to recovery room.     Kerin PernaPeter Van Trigt, M.D.     PV/MEDQ  D:  06/15/2017  T:  06/16/2017  Job:  [id number]

## 2017-06-17 LAB — BASIC METABOLIC PANEL
Anion gap: 11 (ref 5–15)
BUN: 8 mg/dL (ref 6–20)
CO2: 26 mmol/L (ref 22–32)
Calcium: 9.4 mg/dL (ref 8.9–10.3)
Chloride: 100 mmol/L — ABNORMAL LOW (ref 101–111)
Creatinine, Ser: 0.61 mg/dL (ref 0.61–1.24)
GFR calc Af Amer: >60 mL/min (ref >60)
GFR calc non Af Amer: >60 mL/min (ref >60)
Glucose, Bld: 89 mg/dL (ref 65–99)
Potassium: 3.9 mmol/L (ref 3.5–5.1)
Sodium: 137 mmol/L (ref 135–145)

## 2017-06-17 MED ORDER — OXYCODONE HCL 5 MG PO TABS
10.0000 mg | ORAL_TABLET | ORAL | Status: DC | PRN
  Administered 2017-06-17 – 2017-06-29 (×84): 10 mg via ORAL
  Filled 2017-06-17 (×84): qty 2

## 2017-06-17 NOTE — Progress Notes (Addendum)
      [address]       [place], [id number]             [phone]      2 Days Post-Op Procedure(s) (LRB): IRRIGATION OF RIGHT STERNOCLAVICULAR WOUND (Right) WOUND VAC CHANGE (Right) Subjective: Having more pain today. He feels he may have over did it yesterday. He plans to walk after his pain pill.  Objective: Vital signs in last 24 hours: Temp:  [97.7 F (36.5 C)-98.6 F (37 C)] 97.7 F (36.5 C) (01/26 0540) Pulse Rate:  [77-81] 81 (01/26 0540) Resp:  [15-19] 15 (01/26 0540) BP: (112-118)/(61-75) 112/75 (01/26 0540) SpO2:  [98 %-100 %] 98 % (01/26 0540)     Intake/Output from previous day: 01/25 0701 - 01/26 0700 In: 1080 [P.O.:1080] Out: -  Intake/Output this shift: No intake/output data recorded.  General appearance: alert, cooperative and mild distress Heart: regular rate and rhythm, S1, S2 normal, no murmur, click, rub or gallop Lungs: clear to auscultation bilaterally Abdomen: soft, non-tender; bowel sounds normal; no masses,  no organomegaly Extremities: extremities normal, atraumatic, no cyanosis or edema Wound: wound vac with good seal  Lab Results: Recent Labs    06/15/17 0534  WBC 10.1  HGB 11.8*  HCT 36.5*  PLT 809*   BMET:  Recent Labs    06/16/17 0510 06/17/17 0423  NA 139 137  K 4.7 3.9  CL 101 100*  CO2 25 26  GLUCOSE 111* 89  BUN 10 8  CREATININE 0.53* 0.61  CALCIUM 9.7 9.4    PT/INR: No results for input(s): LABPROT, INR in the last 72 hours. ABG    Component Value Date/Time   PHART 7.430 06/07/2017 0940   HCO3 27.5 06/07/2017 0940   TCO2 29 06/05/2017 1457   ACIDBASEDEF 4.0 (H) 03/13/2007 0120   O2SAT 90.2 06/07/2017 0940   CBG (last 3)  No results for input(s): GLUCAP in the last 72 hours.  Assessment/Plan: S/P Procedure(s) (LRB): IRRIGATION OF RIGHT STERNOCLAVICULAR WOUND (Right) WOUND VAC CHANGE (Right)  1. Continue wound vac changes M,W,F starting Monday 1-28. Wound vac with good suction 2.  Continue IV Ancef, ID consulted. 3. Pain control-poor control this afternoon. Will switch oxycodone to every 3 hours and hopefully wean off Dilaudid PRN. Will also order a heating pad for his right shoulder since he is having increased soreness. He is also having pain at his left hip surgical site where his abscess was removed. Could also consider Toradol for non narcotic pain relief since his creatinine is 0.61.       LOS: 12 days    Sharlene Doryessa N Conte 06/17/2017 Return to OR Thursday for wound VAC change and wound inspection Short course of IV Toradol to supplement narcotics for pain control patient examined and medical record reviewed,agree with above note. [name] 06/17/2017

## 2017-06-17 NOTE — Progress Notes (Signed)
PROGRESS NOTE    Clifford Tucker   ZOX:[id number]  DOB: [birth year]  DOA: 06/05/2017 PCP: Patient, No Pcp Per   Brief Narrative:  Clifford Tucker a 37 y.o.malewith medical history significant forIV drug abuse and recurrent skin and soft tissue infections. He presented with neck/hip pain found to have a neck abscess, septic arthritis of the hip and now MSSA bacteremia. CT surgery, orthopedic surgery and infectious disease consulted.   Subjective: Pain in right shoulder.  ROS: no complaints of nausea, vomiting, constipation diarrhea, cough, dyspnea or dysuria. No other complaints.   Assessment & Plan:   Bacteremia due to methicillin susceptible Staphylococcus aureus (MSSA) in the setting of IV drug use -stable, no changes, afebrile, normotensive. 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  Reported having history of multiple abscesses over the last few weeks -Patient was originally placed on vancomycin and Zosyn, transition to IV Ancef -Sternal aspirate showed MSSA, left hip wound cultures also MSSA -2D echo with no vegetation.  - ID following, recommended continue cefazolin for total 6 wks  Left hip abscess/iliopsoas septic arthritis, infectious myositis -Status post left hip I&D with cultures done on 1/15, showing staph aureus -Status post drain placement 1/18 for iliacus abscess which was removed on 06/14/17 -Doppler ultrasound of the left lower extremity negative for DVT  Septic arthritis of the sternoclavicular joint, abscess  -June 15, 2017 :status post irrigation of the right sternoclavicular wound, with a wound VAC placement -Anticipating reevaluation of wound VAC and further irrigation and cleaning next week. Cardiothoracic surgery team recommending wound VAC change Mondays, Wednesdays, Fridays  Starting Monday, January 28, continue antibiotics - will go back to OR on Thursday per CT  sugery  Multifocal pneumonia -ID following, continue cefazolin    IV drug user -Per patient, has been using heroin, [facility] but relapsed again in a month before the admission -Last heroin use a week ago PTA, so far not in any withdrawals -Social work consulted for rehab/SNF placement   Nicotine abuse -Nicotine patch placed   DVT prophylaxis: Lovenox Code Status: Full code Family Communication:  Disposition Plan:  Consultants:   CT surgery Procedures:     Antimicrobials:  Anti-infectives (From admission, onward)   Start     Dose/Rate Route Frequency Ordered Stop   06/15/17 0816  vancomycin (VANCOCIN) powder  Status:  Discontinued       As needed 06/15/17 0817 06/15/17 0854   06/07/17 1400  cefUROXime (ZINACEF) 1.5 g in dextrose 5 % 50 mL IVPB     1.5 g 100 mL/hr over 30 Minutes Intravenous To ShortStay Surgical 06/07/17 0125 06/07/17 1527   06/07/17 0745  vancomycin (VANCOCIN) 1,000 mg in sodium chloride 0.9 % 1,000 mL irrigation      Irrigation To Surgery 06/07/17 0744 06/07/17 1513   06/06/17 1639  gentamicin (GARAMYCIN) injection  Status:  Discontinued       As needed 06/06/17 1640 06/06/17 1715   06/06/17 1638  vancomycin (VANCOCIN) powder  Status:  Discontinued       As needed 06/06/17 1639 06/06/17 1715   06/06/17 1000  ceFAZolin (ANCEF) IVPB 2g/100 mL premix     2 g 200 mL/hr over 30 Minutes Intravenous Every 8 hours 06/06/17 0911     06/06/17 0000  vancomycin (VANCOCIN) IVPB 1000 mg/200 mL premix  Status:  Discontinued     1,000 mg 200 mL/hr over 60 Minutes Intravenous Every 8  hours 06/05/17 2211 06/06/17 0911   06/05/17 2330  piperacillin-tazobactam (ZOSYN) IVPB 3.375 g  Status:  Discontinued     3.375 g 12.5 mL/hr over 240 Minutes Intravenous Every 8 hours 06/05/17 2211 06/06/17 0911   06/05/17 2200  cefTRIAXone (ROCEPHIN) 1 g in dextrose 5 % 50 mL IVPB  Status:  Discontinued     1 g 100 mL/hr over 30 Minutes Intravenous Every 24  hours 06/05/17 2140 06/05/17 2202   06/05/17 2200  azithromycin (ZITHROMAX) 500 mg in dextrose 5 % 250 mL IVPB  Status:  Discontinued     500 mg 250 mL/hr over 60 Minutes Intravenous Every 24 hours 06/05/17 2140 06/06/17 0911   06/05/17 1600  vancomycin (VANCOCIN) 1,500 mg in sodium chloride 0.9 % 500 mL IVPB     1,500 mg 250 mL/hr over 120 Minutes Intravenous  Once 06/05/17 1542 06/05/17 1855   06/05/17 1515  piperacillin-tazobactam (ZOSYN) IVPB 3.375 g     3.375 g 100 mL/hr over 30 Minutes Intravenous  Once 06/05/17 1512 06/05/17 1557       Objective: Vitals:   06/16/17 0202 06/16/17 1404 06/16/17 2345 06/17/17 0540  BP: 128/61 118/68 112/61 112/75  Pulse: 80 79 77 81  Resp: 16 18 19 15   Temp: 98.2 F (36.8 C) 98.5 F (36.9 C) 98.6 F (37 C) 97.7 F (36.5 C)  TempSrc: Oral Oral Oral Oral  SpO2: 98% 100% 99% 98%  Weight:      Height:        Intake/Output Summary (Last 24 hours) at 06/17/2017 0914 Last data filed at 06/17/2017 0600 Gross per 24 hour  Intake 1080 ml  Output -  Net 1080 ml   Filed Weights   06/05/17 1407 06/06/17 0844  Weight: 68 kg (150 lb) 70.3 kg (154 lb 15.7 oz)    Examination: General exam: Appears comfortable  HEENT: PERRLA, oral mucosa moist, no sclera icterus or thrush Respiratory system: Clear to auscultation. Respiratory effort normal. Cardiovascular system: S1 & S2 heard, RRR.  No murmurs  Chest: wound vac in upper chest wall Gastrointestinal system: Abdomen soft, non-tender, nondistended. Normal bowel sound. No organomegaly Central nervous system: Alert and oriented. No focal neurological deficits. Extremities: No cyanosis, clubbing or edema Skin: No rashes or ulcers Psychiatry:  Mood & affect appropriate.     Data Reviewed: I have personally reviewed following labs and imaging studies  CBC: Recent Labs  Lab 06/15/17 0534  WBC 10.1  HGB 11.8*  HCT 36.5*  MCV 92.4  PLT 809*   Basic Metabolic Panel: Recent Labs  Lab  06/13/17 0527 06/14/17 0810 06/15/17 0534 06/16/17 0510 06/17/17 0423  NA 135 136 138 139 137  K 3.7 4.0 4.3 4.7 3.9  CL 100* 100* 103 101 100*  CO2 24 25 25 25 26   GLUCOSE 92 96 86 111* 89  BUN 10 8 8 10 8   CREATININE 0.48* 0.62 0.60* 0.53* 0.61  CALCIUM 9.0 9.3 9.0 9.7 9.4   GFR: Estimated Creatinine Clearance: 128.2 mL/min (by C-G formula based on SCr of 0.61 mg/dL). Liver Function Tests: No results for input(s): AST, ALT, ALKPHOS, BILITOT, PROT, ALBUMIN in the last 168 hours. No results for input(s): LIPASE, AMYLASE in the last 168 hours. No results for input(s): AMMONIA in the last 168 hours. Coagulation Profile: No results for input(s): INR, PROTIME in the last 168 hours. Cardiac Enzymes: No results for input(s): CKTOTAL, CKMB, CKMBINDEX, TROPONINI in the last 168 hours. BNP (last 3 results) No results for  input(s): PROBNP in the last 8760 hours. HbA1C: No results for input(s): HGBA1C in the last 72 hours. CBG: No results for input(s): GLUCAP in the last 168 hours. Lipid Profile: No results for input(s): CHOL, HDL, LDLCALC, TRIG, CHOLHDL, LDLDIRECT in the last 72 hours. Thyroid Function Tests: No results for input(s): TSH, T4TOTAL, FREET4, T3FREE, THYROIDAB in the last 72 hours. Anemia Panel: No results for input(s): VITAMINB12, FOLATE, FERRITIN, TIBC, IRON, RETICCTPCT in the last 72 hours. Urine analysis:    Component Value Date/Time   COLORURINE YELLOW 06/06/2017 1333   APPEARANCEUR CLEAR 06/06/2017 1333   LABSPEC 1.010 06/06/2017 1333   PHURINE 6.0 06/06/2017 1333   GLUCOSEU NEGATIVE 06/06/2017 1333   HGBUR NEGATIVE 06/06/2017 1333   BILIRUBINUR NEGATIVE 06/06/2017 1333   KETONESUR NEGATIVE 06/06/2017 1333   PROTEINUR NEGATIVE 06/06/2017 1333   UROBILINOGEN 0.2 10/03/2012 0856   NITRITE NEGATIVE 06/06/2017 1333   LEUKOCYTESUR NEGATIVE 06/06/2017 1333   Sepsis Labs: @LABRCNTIP (procalcitonin:4,lacticidven:4) ) Recent Results (from the past 240 hour(s))   Aerobic/Anaerobic Culture (surgical/deep wound)     Status: None   Collection Time: 06/07/17  3:32 PM  Result Value Ref Range Status   Specimen Description WOUND  Final   Special Requests CHEST WALL ABSCESS  Final   Gram Stain   Final    ABUNDANT WBC PRESENT, PREDOMINANTLY PMN MODERATE GRAM POSITIVE COCCI    Culture   Final    MODERATE STAPHYLOCOCCUS AUREUS NO ANAEROBES ISOLATED    Report Status 06/12/2017 FINAL  Final   Organism ID, Bacteria STAPHYLOCOCCUS AUREUS  Final      Susceptibility   Staphylococcus aureus - MIC*    CIPROFLOXACIN <=0.5 SENSITIVE Sensitive     ERYTHROMYCIN <=0.25 SENSITIVE Sensitive     GENTAMICIN <=0.5 SENSITIVE Sensitive     OXACILLIN 0.5 SENSITIVE Sensitive     TETRACYCLINE <=1 SENSITIVE Sensitive     VANCOMYCIN 1 SENSITIVE Sensitive     TRIMETH/SULFA <=10 SENSITIVE Sensitive     CLINDAMYCIN <=0.25 SENSITIVE Sensitive     RIFAMPIN <=0.5 SENSITIVE Sensitive     Inducible Clindamycin NEGATIVE Sensitive     * MODERATE STAPHYLOCOCCUS AUREUS  Aerobic/Anaerobic Culture (surgical/deep wound)     Status: None   Collection Time: 06/09/17  6:24 PM  Result Value Ref Range Status   Specimen Description ABSCESS LEFT ILIACUS  Final   Special Requests NONE  Final   Gram Stain   Final    ABUNDANT WBC PRESENT, PREDOMINANTLY PMN NO ORGANISMS SEEN    Culture   Final    FEW STAPHYLOCOCCUS AUREUS SUSCEPTIBILITIES PERFORMED ON PREVIOUS CULTURE WITHIN THE LAST 5 DAYS. NO ANAEROBES ISOLATED    Report Status 06/15/2017 FINAL  Final         Radiology Studies: No results found.    Scheduled Meds: . enoxaparin (LOVENOX) injection  40 mg Subcutaneous Q24H  . gabapentin  300 mg Oral TID  . iopamidol  20 mL Intra-articular Once  . lidocaine (PF)  5 mL Intradermal Once  . nicotine  21 mg Transdermal Daily  . zinc sulfate  220 mg Oral Daily   Continuous Infusions: . sodium chloride    .  ceFAZolin (ANCEF) IV Stopped (06/17/17 0203)  . lactated ringers 10  mL/hr at 06/06/17 1406  . lactated ringers 10 mL/hr at 06/07/17 1354  . potassium chloride       LOS: 12 days    Time spent in minutes: 35    Calvert CantorSaima Aamira Bischoff, MD [facility] Pager: [email or web address] Password [email or web address]  06/17/2017, 9:14 AM

## 2017-06-17 NOTE — Progress Notes (Signed)
     Subjective: 2 Days Post-Op Procedure(s) (LRB): IRRIGATION OF RIGHT STERNOCLAVICULAR WOUND (Right) WOUND VAC CHANGE (Right) Awake, alert and oriented x 4. Pain right shoulder, patient indicates that plan is to perform repeat I&D this Thursday.    Patient reports pain as moderate.    Objective:   VITALS:  Temp:  [97.7 F (36.5 C)-98.6 F (37 C)] 97.7 F (36.5 C) (01/26 0540) Pulse Rate:  [77-81] 81 (01/26 0540) Resp:  [15-19] 15 (01/26 0540) BP: (112-118)/(61-75) 112/75 (01/26 0540) SpO2:  [98 %-100 %] 98 % (01/26 0540)  Neurologically intact ABD soft Neurovascular intact Sensation intact distally Intact pulses distally Dorsiflexion/Plantar flexion intact Incision: no drainage and left hip incision subcut suture line no fullness or drainage, new dressing, No cellulitis present Compartment soft VAC right medial [place] joint is intact very little drainage.   LABS Recent Labs    06/15/17 0534  HGB 11.8*  WBC 10.1  PLT 809*   Recent Labs    06/16/17 0510 06/17/17 0423  NA 139 137  K 4.7 3.9  CL 101 100*  CO2 25 26  BUN 10 8  CREATININE 0.53* 0.61  GLUCOSE 111* 89   No results for input(s): LABPT, INR in the last 72 hours.   Assessment/Plan: 2 Days Post-Op Procedure(s) (LRB): IRRIGATION OF RIGHT STERNOCLAVICULAR WOUND (Right) WOUND VAC CHANGE (Right)  Advance diet Up with therapy Continue ABX therapy due to multifocal pyarthrosis and osteomyelitis infection  Vira BrownsJames Nesbit Michon 06/17/2017, 9:13 AMPatient ID: Clifford Tucker, male   DOB: [birth year], 37 y.o.   MRN: [id number]

## 2017-06-18 LAB — BASIC METABOLIC PANEL
Anion gap: 13 (ref 5–15)
BUN: 8 mg/dL (ref 6–20)
CO2: 26 mmol/L (ref 22–32)
Calcium: 9.6 mg/dL (ref 8.9–10.3)
Chloride: 94 mmol/L — ABNORMAL LOW (ref 101–111)
Creatinine, Ser: 0.67 mg/dL (ref 0.61–1.24)
GFR calc Af Amer: >60 mL/min (ref >60)
GFR calc non Af Amer: >60 mL/min (ref >60)
Glucose, Bld: 143 mg/dL — ABNORMAL HIGH (ref 65–99)
Potassium: 3.9 mmol/L (ref 3.5–5.1)
Sodium: 133 mmol/L — ABNORMAL LOW (ref 135–145)

## 2017-06-18 NOTE — Progress Notes (Signed)
PROGRESS NOTE    Clifford Tucker   ZOX:[id number]  DOB: [birth year]  DOA: 06/05/2017 PCP: Patient, No Pcp Per   Brief Narrative:  Clifford Tucker a 37 y.o.malewith medical history significant forIV drug abuse and recurrent skin and soft tissue infections. He presented with neck/hip pain found to have a neck abscess, septic arthritis of the hip and now MSSA bacteremia. CT surgery, orthopedic surgery and infectious disease consulted.   Subjective: No new complaints.  ROS: no complaints of nausea, vomiting, constipation diarrhea, cough, dyspnea or dysuria. No other complaints.   Assessment & Plan:   Bacteremia due to methicillin susceptible Staphylococcus aureus (MSSA) in the setting of IV drug use -stable, no changes, afebrile, normotensive. 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  Reported having history of multiple abscesses over the last few weeks -Patient was originally placed on vancomycin and Zosyn, transition to IV Ancef -Sternal aspirate showed MSSA, left hip wound cultures also MSSA -2D echo with no vegetation.  - ID following, recommended continue cefazolin for total 6 wks  Left hip abscess/iliopsoas septic arthritis, infectious myositis -Status post left hip I&D with cultures done on 1/15, showing staph aureus -Status post drain placement 1/18 for iliacus abscess which was removed on 06/14/17 -Doppler ultrasound of the left lower extremity negative for DVT  Septic arthritis of the sternoclavicular joint, abscess  -June 15, 2017 :status post irrigation of the right sternoclavicular wound, with a wound VAC placement -Anticipating reevaluation of wound VAC and further irrigation and cleaning next week. Cardiothoracic surgery team recommending wound VAC change Mondays, Wednesdays, Fridays  Starting Monday, January 28, continue antibiotics - will go back to OR on Thursday per CT  sugery  Multifocal pneumonia -ID following, continue cefazolin    IV drug user -Per patient, has been using heroin, [facility] but relapsed again in a month before the admission -Last heroin use a week ago PTA, so far not in any withdrawals -Social work consulted for rehab/SNF placement   Nicotine abuse -Nicotine patch placed   DVT prophylaxis: Lovenox Code Status: Full code Family Communication:  Disposition Plan:  Consultants:   CT surgery Procedures:     Antimicrobials:  Anti-infectives (From admission, onward)   Start     Dose/Rate Route Frequency Ordered Stop   06/15/17 0816  vancomycin (VANCOCIN) powder  Status:  Discontinued       As needed 06/15/17 0817 06/15/17 0854   06/07/17 1400  cefUROXime (ZINACEF) 1.5 g in dextrose 5 % 50 mL IVPB     1.5 g 100 mL/hr over 30 Minutes Intravenous To ShortStay Surgical 06/07/17 0125 06/07/17 1527   06/07/17 0745  vancomycin (VANCOCIN) 1,000 mg in sodium chloride 0.9 % 1,000 mL irrigation      Irrigation To Surgery 06/07/17 0744 06/07/17 1513   06/06/17 1639  gentamicin (GARAMYCIN) injection  Status:  Discontinued       As needed 06/06/17 1640 06/06/17 1715   06/06/17 1638  vancomycin (VANCOCIN) powder  Status:  Discontinued       As needed 06/06/17 1639 06/06/17 1715   06/06/17 1000  ceFAZolin (ANCEF) IVPB 2g/100 mL premix     2 g 200 mL/hr over 30 Minutes Intravenous Every 8 hours 06/06/17 0911     06/06/17 0000  vancomycin (VANCOCIN) IVPB 1000 mg/200 mL premix  Status:  Discontinued     1,000 mg 200 mL/hr over 60 Minutes Intravenous Every 8 hours  06/05/17 2211 06/06/17 0911   06/05/17 2330  piperacillin-tazobactam (ZOSYN) IVPB 3.375 g  Status:  Discontinued     3.375 g 12.5 mL/hr over 240 Minutes Intravenous Every 8 hours 06/05/17 2211 06/06/17 0911   06/05/17 2200  cefTRIAXone (ROCEPHIN) 1 g in dextrose 5 % 50 mL IVPB  Status:  Discontinued     1 g 100 mL/hr over 30 Minutes Intravenous Every 24  hours 06/05/17 2140 06/05/17 2202   06/05/17 2200  azithromycin (ZITHROMAX) 500 mg in dextrose 5 % 250 mL IVPB  Status:  Discontinued     500 mg 250 mL/hr over 60 Minutes Intravenous Every 24 hours 06/05/17 2140 06/06/17 0911   06/05/17 1600  vancomycin (VANCOCIN) 1,500 mg in sodium chloride 0.9 % 500 mL IVPB     1,500 mg 250 mL/hr over 120 Minutes Intravenous  Once 06/05/17 1542 06/05/17 1855   06/05/17 1515  piperacillin-tazobactam (ZOSYN) IVPB 3.375 g     3.375 g 100 mL/hr over 30 Minutes Intravenous  Once 06/05/17 1512 06/05/17 1557       Objective: Vitals:   06/17/17 0540 06/17/17 1335 06/17/17 2121 06/18/17 0500  BP: 112/75 117/71 118/71 116/66  Pulse: 81 (!) 101 (!) 109 82  Resp: 15 16 18 17   Temp: 97.7 F (36.5 C) 98.1 F (36.7 C) 98.4 F (36.9 C) 98.4 F (36.9 C)  TempSrc: Oral Oral Oral   SpO2: 98% 98% 95% 95%  Weight:      Height:        Intake/Output Summary (Last 24 hours) at 06/18/2017 1350 Last data filed at 06/18/2017 1232 Gross per 24 hour  Intake 3032 ml  Output 50 ml  Net 2982 ml   Filed Weights   06/05/17 1407 06/06/17 0844  Weight: 68 kg (150 lb) 70.3 kg (154 lb 15.7 oz)    Examination: General exam: Appears comfortable  HEENT: PERRLA, oral mucosa moist, no sclera icterus or thrush Respiratory system: Clear to auscultation. Respiratory effort normal. Cardiovascular system: S1 & S2 heard, RRR.  No murmurs  Chest: wound vac in upper chest wall Gastrointestinal system: Abdomen soft, non-tender, nondistended. Normal bowel sound. No organomegaly Central nervous system: Alert and oriented. No focal neurological deficits. Extremities: No cyanosis, clubbing or edema Skin: No rashes or ulcers Psychiatry:  Mood & affect appropriate.     Data Reviewed: I have personally reviewed following labs and imaging studies  CBC: Recent Labs  Lab 06/15/17 0534  WBC 10.1  HGB 11.8*  HCT 36.5*  MCV 92.4  PLT 809*   Basic Metabolic Panel: Recent Labs   Lab 06/14/17 0810 06/15/17 0534 06/16/17 0510 06/17/17 0423 06/18/17 0843  NA 136 138 139 137 133*  K 4.0 4.3 4.7 3.9 3.9  CL 100* 103 101 100* 94*  CO2 25 25 25 26 26   GLUCOSE 96 86 111* 89 143*  BUN 8 8 10 8 8   CREATININE 0.62 0.60* 0.53* 0.61 0.67  CALCIUM 9.3 9.0 9.7 9.4 9.6   GFR: Estimated Creatinine Clearance: 128.2 mL/min (by C-G formula based on SCr of 0.67 mg/dL). Liver Function Tests: No results for input(s): AST, ALT, ALKPHOS, BILITOT, PROT, ALBUMIN in the last 168 hours. No results for input(s): LIPASE, AMYLASE in the last 168 hours. No results for input(s): AMMONIA in the last 168 hours. Coagulation Profile: No results for input(s): INR, PROTIME in the last 168 hours. Cardiac Enzymes: No results for input(s): CKTOTAL, CKMB, CKMBINDEX, TROPONINI in the last 168 hours. BNP (last 3 results) No  results for input(s): PROBNP in the last 8760 hours. HbA1C: No results for input(s): HGBA1C in the last 72 hours. CBG: No results for input(s): GLUCAP in the last 168 hours. Lipid Profile: No results for input(s): CHOL, HDL, LDLCALC, TRIG, CHOLHDL, LDLDIRECT in the last 72 hours. Thyroid Function Tests: No results for input(s): TSH, T4TOTAL, FREET4, T3FREE, THYROIDAB in the last 72 hours. Anemia Panel: No results for input(s): VITAMINB12, FOLATE, FERRITIN, TIBC, IRON, RETICCTPCT in the last 72 hours. Urine analysis:    Component Value Date/Time   COLORURINE YELLOW 06/06/2017 1333   APPEARANCEUR CLEAR 06/06/2017 1333   LABSPEC 1.010 06/06/2017 1333   PHURINE 6.0 06/06/2017 1333   GLUCOSEU NEGATIVE 06/06/2017 1333   HGBUR NEGATIVE 06/06/2017 1333   BILIRUBINUR NEGATIVE 06/06/2017 1333   KETONESUR NEGATIVE 06/06/2017 1333   PROTEINUR NEGATIVE 06/06/2017 1333   UROBILINOGEN 0.2 10/03/2012 0856   NITRITE NEGATIVE 06/06/2017 1333   LEUKOCYTESUR NEGATIVE 06/06/2017 1333   Sepsis Labs: @LABRCNTIP (procalcitonin:4,lacticidven:4) ) Recent Results (from the past 240  hour(s))  Aerobic/Anaerobic Culture (surgical/deep wound)     Status: None   Collection Time: 06/09/17  6:24 PM  Result Value Ref Range Status   Specimen Description ABSCESS LEFT ILIACUS  Final   Special Requests NONE  Final   Gram Stain   Final    ABUNDANT WBC PRESENT, PREDOMINANTLY PMN NO ORGANISMS SEEN    Culture   Final    FEW STAPHYLOCOCCUS AUREUS SUSCEPTIBILITIES PERFORMED ON PREVIOUS CULTURE WITHIN THE LAST 5 DAYS. NO ANAEROBES ISOLATED    Report Status 06/15/2017 FINAL  Final         Radiology Studies: No results found.    Scheduled Meds: . enoxaparin (LOVENOX) injection  40 mg Subcutaneous Q24H  . gabapentin  300 mg Oral TID  . iopamidol  20 mL Intra-articular Once  . lidocaine (PF)  5 mL Intradermal Once  . nicotine  21 mg Transdermal Daily  . zinc sulfate  220 mg Oral Daily   Continuous Infusions: . sodium chloride    .  ceFAZolin (ANCEF) IV Stopped (06/18/17 1039)  . lactated ringers 10 mL/hr at 06/06/17 1406  . lactated ringers 10 mL/hr at 06/07/17 1354  . potassium chloride       LOS: 13 days    Time spent in minutes: 35    Calvert Cantor, MD [facility] Pager: [email or web address] Password [email or web address] 06/18/2017, 1:50 PM

## 2017-06-19 LAB — BASIC METABOLIC PANEL
Anion gap: 11 (ref 5–15)
BUN: 9 mg/dL (ref 6–20)
CO2: 25 mmol/L (ref 22–32)
Calcium: 9.4 mg/dL (ref 8.9–10.3)
Chloride: 98 mmol/L — ABNORMAL LOW (ref 101–111)
Creatinine, Ser: 0.56 mg/dL — ABNORMAL LOW (ref 0.61–1.24)
GFR calc Af Amer: >60 mL/min (ref >60)
GFR calc non Af Amer: >60 mL/min (ref >60)
Glucose, Bld: 104 mg/dL — ABNORMAL HIGH (ref 65–99)
Potassium: 4.2 mmol/L (ref 3.5–5.1)
Sodium: 134 mmol/L — ABNORMAL LOW (ref 135–145)

## 2017-06-19 MED ORDER — SODIUM CHLORIDE 0.9% FLUSH
10.0000 mL | INTRAVENOUS | Status: DC | PRN
  Administered 2017-06-19: 10 mL
  Administered 2017-06-20: 20 mL
  Administered 2017-06-27 – 2017-07-27 (×2): 10 mL
  Filled 2017-06-19 (×4): qty 40

## 2017-06-19 MED ORDER — SODIUM CHLORIDE 0.9 % IV SOLN
INTRAVENOUS | Status: DC
  Administered 2017-06-19: 11:00:00 via INTRAVENOUS
  Administered 2017-06-22: 1 mL via INTRAVENOUS
  Administered 2017-07-01: 19:00:00 via INTRAVENOUS

## 2017-06-19 NOTE — Progress Notes (Signed)
PROGRESS NOTE    Clifford Tucker   ZOX:[id number]  DOB: 04-01-1981  DOA: 06/05/2017 PCP: Patient, No Pcp Per   Brief Narrative:  Clifford Tucker a 37 y.o.malewith medical history significant forIV drug abuse and recurrent skin and soft tissue infections. He presented with neck/hip pain found to have a neck abscess, septic arthritis of the hip and now MSSA bacteremia. CT surgery, orthopedic surgery and infectious disease consulted.   Subjective: Asking for IV to be removed as it is quite painful.  ROS: no complaints of nausea, vomiting, constipation diarrhea, cough, dyspnea or dysuria. No other complaints.   Assessment & Plan:   Bacteremia due to methicillin susceptible Staphylococcus aureus (MSSA) in the setting of IV drug use -stable, no changes, afebrile, normotensive. 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  Reported having history of multiple abscesses over the last few weeks -Patient was originally placed on vancomycin and Zosyn, transition to IV Ancef -Sternal aspirate showed MSSA, left hip wound cultures also MSSA -2D echo with no vegetation.  - ID following, recommended continue cefazolin for total 6 wks - will place PICC today- he will either go to SNF or complete treatment in the hospital  Left hip abscess/iliopsoas septic arthritis, infectious myositis -Status post left hip I&D with cultures done on 1/15, showing staph aureus -Status post drain placement 1/18 for iliacus abscess which was removed on 06/14/17 -Doppler ultrasound of the left lower extremity negative for DVT  Septic arthritis of the sternoclavicular joint, abscess  -June 15, 2017 :status post irrigation of the right sternoclavicular wound, with a wound VAC placement -Anticipating reevaluation of wound VAC and further irrigation and cleaning next week. Cardiothoracic surgery team recommending wound VAC change Mondays,  Wednesdays, Fridays  Starting Monday, January 28, continue antibiotics - will go back to OR on Thursday per CT sugery  Multifocal pneumonia -ID following, continue cefazolin    IV drug user -Per patient, has been using heroin, [facility] but relapsed again in a month before the admission -Last heroin use a week ago PTA, so far not in any withdrawals -Social work consulted for rehab/SNF placement   Nicotine abuse -Nicotine patch placed   DVT prophylaxis: Lovenox Code Status: Full code Family Communication:  Disposition Plan:  Consultants:   CT surgery Procedures:     Antimicrobials:  Anti-infectives (From admission, onward)   Start     Dose/Rate Route Frequency Ordered Stop   06/15/17 0816  vancomycin (VANCOCIN) powder  Status:  Discontinued       As needed 06/15/17 0817 06/15/17 0854   06/07/17 1400  cefUROXime (ZINACEF) 1.5 g in dextrose 5 % 50 mL IVPB     1.5 g 100 mL/hr over 30 Minutes Intravenous To ShortStay Surgical 06/07/17 0125 06/07/17 1527   06/07/17 0745  vancomycin (VANCOCIN) 1,000 mg in sodium chloride 0.9 % 1,000 mL irrigation      Irrigation To Surgery 06/07/17 0744 06/07/17 1513   06/06/17 1639  gentamicin (GARAMYCIN) injection  Status:  Discontinued       As needed 06/06/17 1640 06/06/17 1715   06/06/17 1638  vancomycin (VANCOCIN) powder  Status:  Discontinued       As needed 06/06/17 1639 06/06/17 1715   06/06/17 1000  ceFAZolin (ANCEF) IVPB 2g/100 mL premix     2 g 200 mL/hr over 30 Minutes Intravenous Every 8 hours 06/06/17 0911     06/06/17 0000  vancomycin (  VANCOCIN) IVPB 1000 mg/200 mL premix  Status:  Discontinued     1,000 mg 200 mL/hr over 60 Minutes Intravenous Every 8 hours 06/05/17 2211 06/06/17 0911   06/05/17 2330  piperacillin-tazobactam (ZOSYN) IVPB 3.375 g  Status:  Discontinued     3.375 g 12.5 mL/hr over 240 Minutes Intravenous Every 8 hours 06/05/17 2211 06/06/17 0911   06/05/17 2200  cefTRIAXone (ROCEPHIN)  1 g in dextrose 5 % 50 mL IVPB  Status:  Discontinued     1 g 100 mL/hr over 30 Minutes Intravenous Every 24 hours 06/05/17 2140 06/05/17 2202   06/05/17 2200  azithromycin (ZITHROMAX) 500 mg in dextrose 5 % 250 mL IVPB  Status:  Discontinued     500 mg 250 mL/hr over 60 Minutes Intravenous Every 24 hours 06/05/17 2140 06/06/17 0911   06/05/17 1600  vancomycin (VANCOCIN) 1,500 mg in sodium chloride 0.9 % 500 mL IVPB     1,500 mg 250 mL/hr over 120 Minutes Intravenous  Once 06/05/17 1542 06/05/17 1855   06/05/17 1515  piperacillin-tazobactam (ZOSYN) IVPB 3.375 g     3.375 g 100 mL/hr over 30 Minutes Intravenous  Once 06/05/17 1512 06/05/17 1557       Objective: Vitals:   06/18/17 0500 06/18/17 1405 06/18/17 2148 06/19/17 0649  BP: 116/66 122/64 103/63 109/60  Pulse: 82 89 98 99  Resp: 17 18 18 18   Temp: 98.4 F (36.9 C) 98.1 F (36.7 C) 98.4 F (36.9 C) 97.7 F (36.5 C)  TempSrc:  Oral Oral Oral  SpO2: 95% 96% 98% 99%  Weight:      Height:        Intake/Output Summary (Last 24 hours) at 06/19/2017 1224 Last data filed at 06/19/2017 0558 Gross per 24 hour  Intake 440 ml  Output 0 ml  Net 440 ml   Filed Weights   06/05/17 1407 06/06/17 0844  Weight: 68 kg (150 lb) 70.3 kg (154 lb 15.7 oz)    Examination: General exam: Appears comfortable  HEENT: PERRLA, oral mucosa moist, no sclera icterus or thrush Respiratory system: Clear to auscultation. Respiratory effort normal. Cardiovascular system: S1 & S2 heard, RRR.  No murmurs  Chest: wound vac in upper chest wall Gastrointestinal system: Abdomen soft, non-tender, nondistended. Normal bowel sound. No organomegaly Central nervous system: Alert and oriented. No focal neurological deficits. Extremities: No cyanosis, clubbing or edema Skin: No rashes or ulcers Psychiatry:  Mood & affect appropriate.     Data Reviewed: I have personally reviewed following labs and imaging studies  CBC: Recent Labs  Lab 06/15/17 0534    WBC 10.1  HGB 11.8*  HCT 36.5*  MCV 92.4  PLT 809*   Basic Metabolic Panel: Recent Labs  Lab 06/15/17 0534 06/16/17 0510 06/17/17 0423 06/18/17 0843 06/19/17 0549  NA 138 139 137 133* 134*  K 4.3 4.7 3.9 3.9 4.2  CL 103 101 100* 94* 98*  CO2 25 25 26 26 25   GLUCOSE 86 111* 89 143* 104*  BUN 8 10 8 8 9   CREATININE 0.60* 0.53* 0.61 0.67 0.56*  CALCIUM 9.0 9.7 9.4 9.6 9.4   GFR: Estimated Creatinine Clearance: 128.2 mL/min (A) (by C-G formula based on SCr of 0.56 mg/dL (L)). Liver Function Tests: No results for input(s): AST, ALT, ALKPHOS, BILITOT, PROT, ALBUMIN in the last 168 hours. No results for input(s): LIPASE, AMYLASE in the last 168 hours. No results for input(s): AMMONIA in the last 168 hours. Coagulation Profile: No results for input(s): INR,  PROTIME in the last 168 hours. Cardiac Enzymes: No results for input(s): CKTOTAL, CKMB, CKMBINDEX, TROPONINI in the last 168 hours. BNP (last 3 results) No results for input(s): PROBNP in the last 8760 hours. HbA1C: No results for input(s): HGBA1C in the last 72 hours. CBG: No results for input(s): GLUCAP in the last 168 hours. Lipid Profile: No results for input(s): CHOL, HDL, LDLCALC, TRIG, CHOLHDL, LDLDIRECT in the last 72 hours. Thyroid Function Tests: No results for input(s): TSH, T4TOTAL, FREET4, T3FREE, THYROIDAB in the last 72 hours. Anemia Panel: No results for input(s): VITAMINB12, FOLATE, FERRITIN, TIBC, IRON, RETICCTPCT in the last 72 hours. Urine analysis:    Component Value Date/Time   COLORURINE YELLOW 06/06/2017 1333   APPEARANCEUR CLEAR 06/06/2017 1333   LABSPEC 1.010 06/06/2017 1333   PHURINE 6.0 06/06/2017 1333   GLUCOSEU NEGATIVE 06/06/2017 1333   HGBUR NEGATIVE 06/06/2017 1333   BILIRUBINUR NEGATIVE 06/06/2017 1333   KETONESUR NEGATIVE 06/06/2017 1333   PROTEINUR NEGATIVE 06/06/2017 1333   UROBILINOGEN 0.2 10/03/2012 0856   NITRITE NEGATIVE 06/06/2017 1333   LEUKOCYTESUR NEGATIVE 06/06/2017  1333   Sepsis Labs: @LABRCNTIP (procalcitonin:4,lacticidven:4) ) Recent Results (from the past 240 hour(s))  Aerobic/Anaerobic Culture (surgical/deep wound)     Status: None   Collection Time: 06/09/17  6:24 PM  Result Value Ref Range Status   Specimen Description ABSCESS LEFT ILIACUS  Final   Special Requests NONE  Final   Gram Stain   Final    ABUNDANT WBC PRESENT, PREDOMINANTLY PMN NO ORGANISMS SEEN    Culture   Final    FEW STAPHYLOCOCCUS AUREUS SUSCEPTIBILITIES PERFORMED ON PREVIOUS CULTURE WITHIN THE LAST 5 DAYS. NO ANAEROBES ISOLATED    Report Status 06/15/2017 FINAL  Final         Radiology Studies: No results found.    Scheduled Meds: . enoxaparin (LOVENOX) injection  40 mg Subcutaneous Q24H  . gabapentin  300 mg Oral TID  . iopamidol  20 mL Intra-articular Once  . lidocaine (PF)  5 mL Intradermal Once  . nicotine  21 mg Transdermal Daily  . zinc sulfate  220 mg Oral Daily   Continuous Infusions: . sodium chloride    . sodium chloride 10 mL/hr at 06/19/17 1052  .  ceFAZolin (ANCEF) IV Stopped (06/19/17 1120)  . potassium chloride       LOS: 14 days    Time spent in minutes: 35    Calvert CantorSaima Caree Wolpert, MD [facility] Pager: [email or web address] Password [facility] 06/19/2017, 12:24 PM

## 2017-06-19 NOTE — Progress Notes (Signed)
PHARMACY CONSULT NOTE FOR:  OUTPATIENT  PARENTERAL ANTIBIOTIC THERAPY (OPAT)  Indication: MSSA bacteremia Regimen: cefazolin 2 g IV q8h End date: 07/21/17  IV antibiotic discharge orders are pended. To discharging provider:  please sign these orders via discharge navigator,  Select New Orders & click on the button choice - Manage This Unsigned Work.     Thank you for allowing pharmacy to be a part of this patient's care.  Loura BackJennifer Plymouth, PharmD, BCPS Clinical Pharmacist Phone for today [phone] Main pharmacy - [phone] 06/19/2017 3:58 PM

## 2017-06-19 NOTE — Progress Notes (Signed)
[facility]  Date of Admission:  06/05/2017     Reason for Visit: Follow up on MSSA bacteremia, septic arthritis of sternoclavicular joint, neck abscesses, left iliopsoas abscess.   Total days of antibiotics 12  Cefazolin day 11        Patient ID: Clifford Tucker is a 37 y.o. male with  Principal Problem:   Bacteremia due to methicillin susceptible Staphylococcus aureus (MSSA) Active Problems:   Septic arthritis of sternoclavicular joint, right (HCC)   Iliopsoas abscess on left (HCC)   Septic arthritis of hip (HCC)   Multifocal pneumonia   Neck abscess   IV drug user   Left hip pain   Normocytic anemia  Interval History / Subjective:  No changes. Stable wbc and afebrile.   . enoxaparin (LOVENOX) injection  40 mg Subcutaneous Q24H  . gabapentin  300 mg Oral TID  . iopamidol  20 mL Intra-articular Once  . lidocaine (PF)  5 mL Intradermal Once  . nicotine  21 mg Transdermal Daily  . zinc sulfate  220 mg Oral Daily   No Known Allergies  OBJECTIVE: Vitals:   06/18/17 1405 06/18/17 2148 06/19/17 0649 06/19/17 1340  BP: 122/64 103/63 109/60 106/72  Pulse: 89 98 99 90  Resp: 18 18 18 20   Temp: 98.1 F (36.7 C) 98.4 F (36.9 C) 97.7 F (36.5 C) 98.8 F (37.1 C)  TempSrc: Oral Oral Oral Oral  SpO2: 96% 98% 99% 98%  Weight:      Height:       Body mass index is 22.24 kg/m.  Physical Exam  Constitutional: He is oriented to person, place, and time. He appears well-developed and well-nourished.  Seated comfortably in chair during visit.   HENT:  Mouth/Throat: Oropharynx is clear and moist and mucous membranes are normal. Normal dentition. No dental abscesses.  Eyes: Pupils are equal, round, and reactive to light. No scleral icterus.  Cardiovascular: Normal rate, regular rhythm and normal heart sounds.  Pulmonary/Chest: Effort normal and breath sounds normal.  Wound VAC in place to right of sternum with adequate seal and without  drainage.   Abdominal: Soft. He exhibits no distension. There is no tenderness.  Lymphadenopathy:    He has no cervical adenopathy.  Neurological: He is alert and oriented to person, place, and time.  Skin: Skin is warm and dry. No rash noted.  Psychiatric: He has a normal mood and affect. Judgment normal.  In good spirits today and engaged in care discussion.   Vitals reviewed.    ASSESSMENT: MSSA bacteremia - TTE without vegetation  Sternoclavicular joint septic arthritis - s/p debridement 06/07/17 Septic arthritis of left hip - s/p debridement 06/06/17 Left Iliopsoas abscess - 06/09/17 perc drain  IVDU  In discussion with LCSW today it appears we will less likely be able to secure SNF placement considering his recent drug use and risk. While it would be ideal to provide him with 6 weeks of IV Ancef, I am not certain this will be realistic considering he is not a candidate for home therapies and SNF may not accept him.   PLAN: 1. Continue Ancef IV through March 1st to complete 6 weeks of treatment.  2. Will arrange for follow up in ID clinic the week of March 11th with either myself or Dr. Luciana Axe. Will check clearance blood cultures then. 3. Please check weekly CBC, CMET while inpatient on IV antibiotics.  4. Can D/C PICC line after  therapy is completed barring no changes in patient's status from infection standpoint.   We are available as needed for hospitalization. Should he get SNF placement will place OPAT consult. Available as needed during remainder of hospitalization.   Rexene AlbertsStephanie Nazeer Romney, MSN, NP-C [facility] Cell: [phone] Pager: [phone]  06/19/2017  3:42 PM

## 2017-06-19 NOTE — Progress Notes (Signed)
Peripherally Inserted Central Catheter/Midline Placement  The IV Nurse has discussed with the patient and/or persons authorized to consent for the patient, the purpose of this procedure and the potential benefits and risks involved with this procedure.  The benefits include less needle sticks, lab draws from the catheter, and the patient may be discharged home with the catheter. Risks include, but not limited to, infection, bleeding, blood clot (thrombus formation), and puncture of an artery; nerve damage and irregular heartbeat and possibility to perform a PICC exchange if needed/ordered by physician.  Alternatives to this procedure were also discussed.  Bard Power PICC patient education guide, fact sheet on infection prevention and patient information card has been provided to patient /or left at bedside.    PICC/Midline Placement Documentation  PICC Single Lumen 06/19/17 PICC Right Brachial 40 cm 0 cm (Active)  Indication for Insertion or Continuance of Line Home intravenous therapies (PICC only) 06/19/2017 10:00 AM  Exposed Catheter (cm) 0 cm 06/19/2017 10:00 AM  Site Assessment Clean;Dry;Intact 06/19/2017 10:00 AM  Line Status Flushed;Blood return noted 06/19/2017 10:00 AM  Dressing Type Transparent 06/19/2017 10:00 AM  Dressing Status Clean;Dry;Intact;Antimicrobial disc in place 06/19/2017 10:00 AM  Dressing Change Due 06/26/17 06/19/2017 10:00 AM       Stacie GlazeJoyce, Emmauel Hallums Horton 06/19/2017, 10:15 AM

## 2017-06-19 NOTE — Progress Notes (Signed)
      [address]       [place] [id number]             [phone]      4 Days Post-Op Procedure(s) (LRB): IRRIGATION OF RIGHT STERNOCLAVICULAR WOUND (Right) WOUND VAC CHANGE (Right) Subjective: Feels less sore today, having a lot of "stress"  Objective: Vital signs in last 24 hours: Temp:  [97.7 F (36.5 C)-98.4 F (36.9 C)] 97.7 F (36.5 C) (01/28 0649) Pulse Rate:  [89-99] 99 (01/28 0649) Resp:  [18] 18 (01/28 0649) BP: (103-122)/(60-64) 109/60 (01/28 0649) SpO2:  [96 %-99 %] 99 % (01/28 0649)  Hemodynamic parameters for last 24 hours:    Intake/Output from previous day: 01/27 0701 - 01/28 0700 In: 740 [P.O.:540; IV Piggyback:200] Out: 0  Intake/Output this shift: No intake/output data recorded.  General appearance: alert, cooperative and no distress Heart: regular rate and rhythm Lungs: clear to auscultation bilaterally Wound: vac just changed earlier today some surrounding erethema  Lab Results: No results for input(s): WBC, HGB, HCT, PLT in the last 72 hours. BMET:  Recent Labs    06/18/17 0843 06/19/17 0549  NA 133* 134*  K 3.9 4.2  CL 94* 98*  CO2 26 25  GLUCOSE 143* 104*  BUN 8 9  CREATININE 0.67 0.56*  CALCIUM 9.6 9.4    PT/INR: No results for input(s): LABPROT, INR in the last 72 hours. ABG    Component Value Date/Time   PHART 7.430 06/07/2017 0940   HCO3 27.5 06/07/2017 0940   TCO2 29 06/05/2017 1457   ACIDBASEDEF 4.0 (H) 03/13/2007 0120   O2SAT 90.2 06/07/2017 0940   CBG (last 3)  No results for input(s): GLUCAP in the last 72 hours.  Meds Scheduled Meds: . enoxaparin (LOVENOX) injection  40 mg Subcutaneous Q24H  . gabapentin  300 mg Oral TID  . iopamidol  20 mL Intra-articular Once  . lidocaine (PF)  5 mL Intradermal Once  . nicotine  21 mg Transdermal Daily  . zinc sulfate  220 mg Oral Daily   Continuous Infusions: . sodium chloride    . sodium chloride 10 mL/hr at 06/19/17 1052  .  ceFAZolin (ANCEF) IV  Stopped (06/19/17 1120)  . potassium chloride     PRN Meds:.acetaminophen **OR** acetaminophen, alum & mag hydroxide-simeth, diphenhydrAMINE, HYDROmorphone (DILAUDID) injection, ondansetron **OR** ondansetron (ZOFRAN) IV, oxyCODONE, potassium chloride, senna-docusate, sodium chloride flush, zolpidem  Xrays No results found.  Assessment/Plan: S/P Procedure(s) (LRB): IRRIGATION OF RIGHT STERNOCLAVICULAR WOUND (Right) WOUND VAC CHANGE (Right)  1 stable, with current plan, he requests no VAC change on weds since going to OR on Thursday.  2 ancef x 6 weeks total per ID 3 SW assisting with placement 4 other management per primary and ortho  LOS: 14 days    [name] 06/19/2017

## 2017-06-19 NOTE — Consult Note (Signed)
WOC Nurse wound consult note Reason for Consult: first post op change NPWT  Wound type: surgical  Pressure Injury POA:NA Measurement: 2.0cm x 4.5cm x 2.0cm  Wound bed: clean, bleeding at wound edges, sorbact dressing in base Drainage (amount, consistency, odor) serosanguinous in canister  Periwound: mild contact dermatitis but not severe, patient is not complaining of any issues Dressing procedure/placement/frequency: 1pc of black foam removed from the wound carefully, Sorbact in the base remained in place 1pc of black foam cut to fit and placed directly into the wound bed, sealed at 125mmHG.  Patient received IV and PO pain meds prior to the dressing changes distinctly on his time frames.   WOC nurse will change dressing again on Wednesday and patient will return (per notes) to OR on Thursday.   Roselina Burgueno [facility] MSN,RN,CWOCN, CNS, [employer]-AP [phone]

## 2017-06-20 LAB — BASIC METABOLIC PANEL
Anion gap: 11 (ref 5–15)
BUN: 6 mg/dL (ref 6–20)
CO2: 26 mmol/L (ref 22–32)
Calcium: 9 mg/dL (ref 8.9–10.3)
Chloride: 101 mmol/L (ref 101–111)
Creatinine, Ser: 0.44 mg/dL — ABNORMAL LOW (ref 0.61–1.24)
GFR calc Af Amer: >60 mL/min (ref >60)
GFR calc non Af Amer: >60 mL/min (ref >60)
Glucose, Bld: 95 mg/dL (ref 65–99)
Potassium: 4 mmol/L (ref 3.5–5.1)
Sodium: 138 mmol/L (ref 135–145)

## 2017-06-20 MED ORDER — WHITE PETROLATUM EX OINT
TOPICAL_OINTMENT | CUTANEOUS | Status: AC
  Administered 2017-06-20: 09:00:00
  Filled 2017-06-20: qty 28.35

## 2017-06-20 NOTE — Progress Notes (Addendum)
      [address]       [place] [id number]             [phone]      5 Days Post-Op Procedure(s) (LRB): IRRIGATION OF RIGHT STERNOCLAVICULAR WOUND (Right) WOUND VAC CHANGE (Right) Subjective: Patient states that the dilaudid is not helping him and he would rather increase the oral pain medication. He also states that occasionally he experiences the pre-symptoms of withdrawal.   Objective: Vital signs in last 24 hours: Temp:  [98.6 F (37 C)-98.8 F (37.1 C)] 98.6 F (37 C) (01/28 2235) Pulse Rate:  [86-90] 86 (01/28 2235) Resp:  [18-20] 18 (01/28 2235) BP: (106-108)/(61-72) 108/61 (01/28 2235) SpO2:  [98 %-99 %] 99 % (01/28 2235)     Intake/Output from previous day: 01/28 0701 - 01/29 0700 In: 900 [P.O.:600; IV Piggyback:300] Out: 25 [Drains:25] Intake/Output this shift: No intake/output data recorded.  General appearance: alert, cooperative and no distress Heart: regular rate and rhythm, S1, S2 normal, no murmur, click, rub or gallop Lungs: clear to auscultation bilaterally Abdomen: soft, non-tender; bowel sounds normal; no masses,  no organomegaly Extremities: extremities normal, atraumatic, no cyanosis or edema Wound: wound vac with good suction  Lab Results: No results for input(s): WBC, HGB, HCT, PLT in the last 72 hours. BMET:  Recent Labs    06/19/17 0549 06/20/17 0358  NA 134* 138  K 4.2 4.0  CL 98* 101  CO2 25 26  GLUCOSE 104* 95  BUN 9 6  CREATININE 0.56* 0.44*  CALCIUM 9.4 9.0    PT/INR: No results for input(s): LABPROT, INR in the last 72 hours. ABG    Component Value Date/Time   PHART 7.430 06/07/2017 0940   HCO3 27.5 06/07/2017 0940   TCO2 29 06/05/2017 1457   ACIDBASEDEF 4.0 (H) 03/13/2007 0120   O2SAT 90.2 06/07/2017 0940   CBG (last 3)  No results for input(s): GLUCAP in the last 72 hours.  Assessment/Plan: S/P Procedure(s) (LRB): IRRIGATION OF RIGHT STERNOCLAVICULAR WOUND (Right) WOUND VAC CHANGE  (Right)  1. Wound vac with good suction. VAC change on Thursday in the OR.  2. Continue IV Ancef x 6 weeks. ID following  3. Pain control-Remains on Oxycodone 10mg  q 3 hours and Dilaudid 1mg  q4 hours. Patient would like to discontinue to dilaudid and increase the oral medication if possible. He states that 10mg  isn't even close to the doses of narcotics he was taking on the streets. He understands that his pain will never be a 0 and that he has built up a tolerance. He plans to start Suboxone when discharged and discontinue all narcotics at that time.      LOS: 15 days    Clifford Tucker 06/20/2017 Wound vac chg in OR thurs patient examined and medical record reviewed,agree with above note. Clifford Tucker 06/21/2017

## 2017-06-20 NOTE — Progress Notes (Addendum)
PROGRESS NOTE    Clifford Tucker   ZOX:[id number]  DOB: [birth year]  DOA: 06/05/2017 PCP: Patient, No Pcp Per   Brief Narrative:  [name] a 37 y.o.malewith medical history significant forIV drug abuse and recurrent skin and soft tissue infections. He presented with neck/hip pain found to have a neck abscess, septic arthritis of the hip and now MSSA bacteremia. CT surgery, orthopedic surgery and infectious disease consulted.   Subjective: PICC placed- no complaints.   Assessment & Plan:   Bacteremia due to methicillin susceptible Staphylococcus aureus (MSSA) in the setting of IV drug use -stable, no changes, afebrile, normotensive. 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  Reported having history of multiple abscesses over the last few weeks -Patient was originally placed on vancomycin and Zosyn, transition to IV Ancef -Sternal aspirate showed MSSA, left hip wound cultures also MSSA -2D echo with no vegetation.  - ID following, recommended continue cefazolin for total 6 wks -   PICC placed- he will either go to SNF or complete treatment in the hospital  Left hip abscess/iliopsoas septic arthritis, infectious myositis -Status post left hip I&D with cultures done on 1/15, showing staph aureus -Status post drain placement 1/18 for iliacus abscess which was removed on 06/14/17 -Doppler ultrasound of the left lower extremity negative for DVT  Septic arthritis of the sternoclavicular joint, abscess  -June 15, 2017 :status post irrigation of the right sternoclavicular wound, with a wound VAC placement -Anticipating reevaluation of wound VAC and further irrigation and cleaning next week. Cardiothoracic surgery team recommending wound VAC change Mondays, Wednesdays, Fridays  Starting Monday, January 28, continue antibiotics - will go back to OR on Thursday per CT sugery  Multifocal pneumonia -ID  following, continue cefazolin    IV drug user -Per patient, has been using heroin, [facility] but relapsed again in a month before the admission -Last heroin use a week ago PTA, so far not in any withdrawals -Social work consulted for rehab/SNF placement   Nicotine abuse -Nicotine patch placed   DVT prophylaxis: Lovenox Code Status: Full code Family Communication:  Disposition Plan:  Consultants:   CT surgery Procedures:     Antimicrobials:  Anti-infectives (From admission, onward)   Start     Dose/Rate Route Frequency Ordered Stop   06/15/17 0816  vancomycin (VANCOCIN) powder  Status:  Discontinued       As needed 06/15/17 0817 06/15/17 0854   06/07/17 1400  cefUROXime (ZINACEF) 1.5 g in dextrose 5 % 50 mL IVPB     1.5 g 100 mL/hr over 30 Minutes Intravenous To ShortStay Surgical 06/07/17 0125 06/07/17 1527   06/07/17 0745  vancomycin (VANCOCIN) 1,000 mg in sodium chloride 0.9 % 1,000 mL irrigation      Irrigation To Surgery 06/07/17 0744 06/07/17 1513   06/06/17 1639  gentamicin (GARAMYCIN) injection  Status:  Discontinued       As needed 06/06/17 1640 06/06/17 1715   06/06/17 1638  vancomycin (VANCOCIN) powder  Status:  Discontinued       As needed 06/06/17 1639 06/06/17 1715   06/06/17 1000  ceFAZolin (ANCEF) IVPB 2g/100 mL premix     2 g 200 mL/hr over 30 Minutes Intravenous Every 8 hours 06/06/17 0911     06/06/17 0000  vancomycin (VANCOCIN) IVPB 1000 mg/200 mL premix  Status:  Discontinued     1,000 mg 200 mL/hr over 60 Minutes Intravenous Every  8 hours 06/05/17 2211 06/06/17 0911   06/05/17 2330  piperacillin-tazobactam (ZOSYN) IVPB 3.375 g  Status:  Discontinued     3.375 g 12.5 mL/hr over 240 Minutes Intravenous Every 8 hours 06/05/17 2211 06/06/17 0911   06/05/17 2200  cefTRIAXone (ROCEPHIN) 1 g in dextrose 5 % 50 mL IVPB  Status:  Discontinued     1 g 100 mL/hr over 30 Minutes Intravenous Every 24 hours 06/05/17 2140 06/05/17 2202    06/05/17 2200  azithromycin (ZITHROMAX) 500 mg in dextrose 5 % 250 mL IVPB  Status:  Discontinued     500 mg 250 mL/hr over 60 Minutes Intravenous Every 24 hours 06/05/17 2140 06/06/17 0911   06/05/17 1600  vancomycin (VANCOCIN) 1,500 mg in sodium chloride 0.9 % 500 mL IVPB     1,500 mg 250 mL/hr over 120 Minutes Intravenous  Once 06/05/17 1542 06/05/17 1855   06/05/17 1515  piperacillin-tazobactam (ZOSYN) IVPB 3.375 g     3.375 g 100 mL/hr over 30 Minutes Intravenous  Once 06/05/17 1512 06/05/17 1557       Objective: Vitals:   06/18/17 2148 06/19/17 0649 06/19/17 1340 06/19/17 2235  BP: 103/63 109/60 106/72 108/61  Pulse: 98 99 90 86  Resp: 18 18 20 18   Temp: 98.4 F (36.9 C) 97.7 F (36.5 C) 98.8 F (37.1 C) 98.6 F (37 C)  TempSrc: Oral Oral Oral Oral  SpO2: 98% 99% 98% 99%  Weight:      Height:        Intake/Output Summary (Last 24 hours) at 06/20/2017 0920 Last data filed at 06/20/2017 0914 Gross per 24 hour  Intake 1380 ml  Output 25 ml  Net 1355 ml   Filed Weights   06/05/17 1407 06/06/17 0844  Weight: 68 kg (150 lb) 70.3 kg (154 lb 15.7 oz)    Examination: General exam: Appears comfortable  HEENT: PERRLA, oral mucosa moist, no sclera icterus or thrush Respiratory system: Clear to auscultation. Respiratory effort normal. Cardiovascular system: S1 & S2 heard, RRR.  No murmurs  Chest: wound vac in upper chest wall Gastrointestinal system: Abdomen soft, non-tender, nondistended. Normal bowel sound. No organomegaly Central nervous system: Alert and oriented. No focal neurological deficits. Extremities: No cyanosis, clubbing or edema Skin: No rashes or ulcers Psychiatry:  Mood & affect appropriate.     Data Reviewed: I have personally reviewed following labs and imaging studies  CBC: Recent Labs  Lab 06/15/17 0534  WBC 10.1  HGB 11.8*  HCT 36.5*  MCV 92.4  PLT 809*   Basic Metabolic Panel: Recent Labs  Lab 06/16/17 0510 06/17/17 0423  06/18/17 0843 06/19/17 0549 06/20/17 0358  NA 139 137 133* 134* 138  K 4.7 3.9 3.9 4.2 4.0  CL 101 100* 94* 98* 101  CO2 25 26 26 25 26   GLUCOSE 111* 89 143* 104* 95  BUN 10 8 8 9 6   CREATININE 0.53* 0.61 0.67 0.56* 0.44*  CALCIUM 9.7 9.4 9.6 9.4 9.0   GFR: Estimated Creatinine Clearance: 128.2 mL/min (A) (by C-G formula based on SCr of 0.44 mg/dL (L)). Liver Function Tests: No results for input(s): AST, ALT, ALKPHOS, BILITOT, PROT, ALBUMIN in the last 168 hours. No results for input(s): LIPASE, AMYLASE in the last 168 hours. No results for input(s): AMMONIA in the last 168 hours. Coagulation Profile: No results for input(s): INR, PROTIME in the last 168 hours. Cardiac Enzymes: No results for input(s): CKTOTAL, CKMB, CKMBINDEX, TROPONINI in the last 168 hours. BNP (last 3  results) No results for input(s): PROBNP in the last 8760 hours. HbA1C: No results for input(s): HGBA1C in the last 72 hours. CBG: No results for input(s): GLUCAP in the last 168 hours. Lipid Profile: No results for input(s): CHOL, HDL, LDLCALC, TRIG, CHOLHDL, LDLDIRECT in the last 72 hours. Thyroid Function Tests: No results for input(s): TSH, T4TOTAL, FREET4, T3FREE, THYROIDAB in the last 72 hours. Anemia Panel: No results for input(s): VITAMINB12, FOLATE, FERRITIN, TIBC, IRON, RETICCTPCT in the last 72 hours. Urine analysis:    Component Value Date/Time   COLORURINE YELLOW 06/06/2017 1333   APPEARANCEUR CLEAR 06/06/2017 1333   LABSPEC 1.010 06/06/2017 1333   PHURINE 6.0 06/06/2017 1333   GLUCOSEU NEGATIVE 06/06/2017 1333   HGBUR NEGATIVE 06/06/2017 1333   BILIRUBINUR NEGATIVE 06/06/2017 1333   KETONESUR NEGATIVE 06/06/2017 1333   PROTEINUR NEGATIVE 06/06/2017 1333   UROBILINOGEN 0.2 10/03/2012 0856   NITRITE NEGATIVE 06/06/2017 1333   LEUKOCYTESUR NEGATIVE 06/06/2017 1333   Sepsis Labs: @LABRCNTIP (procalcitonin:4,lacticidven:4) ) No results found for this or any previous visit (from the past  240 hour(s)).       Radiology Studies: No results found.    Scheduled Meds: . enoxaparin (LOVENOX) injection  40 mg Subcutaneous Q24H  . gabapentin  300 mg Oral TID  . iopamidol  20 mL Intra-articular Once  . lidocaine (PF)  5 mL Intradermal Once  . nicotine  21 mg Transdermal Daily  . zinc sulfate  220 mg Oral Daily   Continuous Infusions: . sodium chloride    . sodium chloride 10 mL/hr at 06/19/17 1052  .  ceFAZolin (ANCEF) IV Stopped (06/20/17 0259)  . potassium chloride       LOS: 15 days    Time spent in minutes: 35    Calvert CantorSaima Avory Mimbs, MD [facility] Pager: [email or web address] Password [email or web address] 06/20/2017, 9:20 AM

## 2017-06-21 MED ORDER — KETOROLAC TROMETHAMINE 30 MG/ML IJ SOLN
30.0000 mg | Freq: Four times a day (QID) | INTRAMUSCULAR | Status: AC
  Administered 2017-06-21 (×3): 30 mg via INTRAVENOUS
  Filled 2017-06-21 (×4): qty 1

## 2017-06-21 NOTE — Progress Notes (Addendum)
      [address]       [place] [id number]             [phone]      6 Days Post-Op Procedure(s) (LRB): IRRIGATION OF RIGHT STERNOCLAVICULAR WOUND (Right) WOUND VAC CHANGE (Right) Subjective: Feels better today, mild discomfort  Objective: Vital signs in last 24 hours: Temp:  [98.3 F (36.8 C)-99.3 F (37.4 C)] 98.9 F (37.2 C) (01/30 0521) Pulse Rate:  [81-98] 81 (01/30 0521) Resp:  [18] 18 (01/30 0521) BP: (106-116)/(69-71) 106/71 (01/30 0521) SpO2:  [98 %-100 %] 98 % (01/30 0521)  Hemodynamic parameters for last 24 hours:    Intake/Output from previous day: 01/29 0701 - 01/30 0700 In: 1320 [P.O.:1320] Out: 25 [Drains:25] Intake/Output this shift: No intake/output data recorded.  General appearance: alert, cooperative and no distress Heart: regular rate and rhythm Lungs: clear to auscultation bilaterally Wound: vac in place, min erethema  Lab Results: No results for input(s): WBC, HGB, HCT, PLT in the last 72 hours. BMET:  Recent Labs    06/19/17 0549 06/20/17 0358  NA 134* 138  K 4.2 4.0  CL 98* 101  CO2 25 26  GLUCOSE 104* 95  BUN 9 6  CREATININE 0.56* 0.44*  CALCIUM 9.4 9.0    PT/INR: No results for input(s): LABPROT, INR in the last 72 hours. ABG    Component Value Date/Time   PHART 7.430 06/07/2017 0940   HCO3 27.5 06/07/2017 0940   TCO2 29 06/05/2017 1457   ACIDBASEDEF 4.0 (H) 03/13/2007 0120   O2SAT 90.2 06/07/2017 0940   CBG (last 3)  No results for input(s): GLUCAP in the last 72 hours.  Meds Scheduled Meds: . enoxaparin (LOVENOX) injection  40 mg Subcutaneous Q24H  . gabapentin  300 mg Oral TID  . iopamidol  20 mL Intra-articular Once  . ketorolac  30 mg Intravenous Q6H  . lidocaine (PF)  5 mL Intradermal Once  . nicotine  21 mg Transdermal Daily  . zinc sulfate  220 mg Oral Daily   Continuous Infusions: . sodium chloride    . sodium chloride 10 mL/hr at 06/19/17 1052  .  ceFAZolin (ANCEF) IV Stopped  (06/21/17 0225)  . potassium chloride     PRN Meds:.acetaminophen **OR** acetaminophen, alum & mag hydroxide-simeth, diphenhydrAMINE, HYDROmorphone (DILAUDID) injection, ondansetron **OR** ondansetron (ZOFRAN) IV, oxyCODONE, potassium chloride, senna-docusate, sodium chloride flush, zolpidem  Xrays No results found.  Assessment/Plan: S/P Procedure(s) (LRB): IRRIGATION OF RIGHT STERNOCLAVICULAR WOUND (Right) WOUND VAC CHANGE (Right)   1 doing well, VAC change tomorrow in or +/- further debridement 2 clinically stable on current RX/TX   LOS: 16 days    [name] 06/21/2017 patient examined and medical record reviewed,agree with above note. [name] 06/21/2017

## 2017-06-21 NOTE — Progress Notes (Signed)
PROGRESS NOTE    Clifford Tucker   ZOX:[id number]  DOB: 06-06-1980  DOA: 06/05/2017 PCP: Patient, No Pcp Per   Brief Narrative:  Clifford Tucker a 37 y.o.malewith medical history significant forIV drug abuse and recurrent skin and soft tissue infections. He presented with neck/hip pain found to have a neck abscess, septic arthritis of the hip and now MSSA bacteremia. CT surgery, orthopedic surgery and infectious disease consulted.   Subjective: He has no complaints.   Assessment & Plan:   Bacteremia due to methicillin susceptible Staphylococcus aureus (MSSA) in the setting of IV drug use -stable, no changes, afebrile, normotensive. 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  Reported having history of multiple abscesses over the last few weeks -Patient was originally placed on vancomycin and Zosyn, transition to IV Ancef -Sternal aspirate showed MSSA, left hip wound cultures also MSSA -2D echo with no vegetation.  - ID following, recommended continue cefazolin for total 6 wks -   PICC placed- he will either go to SNF or complete treatment in the hospital- cannot go home with PICC - of note, today he is asking for IV Dilaudid to be stopped and oral Oxycodone to be increased- he is already receiving 10 mg of Oxycodone Q3 PRN per surgery and using it about 8 -10 times in a 24 hr period -  I did not adjust his narcotics today as, after the I and D tomorrow, our goal should be to slowly start weaning narcotics rather than increasing the oral narcotics  Left hip abscess/iliopsoas septic arthritis, infectious myositis -Status post left hip I&D with cultures done on 1/15, showing staph aureus -Status post drain placement 1/18 for iliacus abscess which was removed on 06/14/17 -Doppler ultrasound of the left lower extremity negative for DVT  Septic arthritis of the sternoclavicular joint, abscess  -June 15, 2017  :status post irrigation of the right sternoclavicular wound, with a wound VAC placement -Anticipating reevaluation of wound VAC and further irrigation and cleaning next week. Cardiothoracic surgery team recommending wound VAC change Mondays, Wednesdays, Fridays  Starting Monday, January 28, continue antibiotics - will go back to OR on Thursday per CT sugery  Multifocal pneumonia -ID following, continue cefazolin    IV drug user -Per patient, has been using heroin, [facility] but relapsed again in a month before the admission -Last heroin use a week ago PTA, so far not in any withdrawals -Social work consulted for rehab/SNF placement   Nicotine abuse -Nicotine patch placed   DVT prophylaxis: Lovenox Code Status: Full code Family Communication:  Disposition Plan:  Consultants:   CT surgery Procedures:     Antimicrobials:  Anti-infectives (From admission, onward)   Start     Dose/Rate Route Frequency Ordered Stop   06/15/17 0816  vancomycin (VANCOCIN) powder  Status:  Discontinued       As needed 06/15/17 0817 06/15/17 0854   06/07/17 1400  cefUROXime (ZINACEF) 1.5 g in dextrose 5 % 50 mL IVPB     1.5 g 100 mL/hr over 30 Minutes Intravenous To ShortStay Surgical 06/07/17 0125 06/07/17 1527   06/07/17 0745  vancomycin (VANCOCIN) 1,000 mg in sodium chloride 0.9 % 1,000 mL irrigation      Irrigation To Surgery 06/07/17 0744 06/07/17 1513   06/06/17 1639  gentamicin (GARAMYCIN) injection  Status:  Discontinued       As needed 06/06/17 1640 06/06/17 1715   06/06/17 1638  vancomycin (VANCOCIN) powder  Status:  Discontinued       As needed 06/06/17 1639 06/06/17 1715   06/06/17 1000  ceFAZolin (ANCEF) IVPB 2g/100 mL premix     2 g 200 mL/hr over 30 Minutes Intravenous Every 8 hours 06/06/17 0911     06/06/17 0000  vancomycin (VANCOCIN) IVPB 1000 mg/200 mL premix  Status:  Discontinued     1,000 mg 200 mL/hr over 60 Minutes Intravenous Every 8 hours  06/05/17 2211 06/06/17 0911   06/05/17 2330  piperacillin-tazobactam (ZOSYN) IVPB 3.375 g  Status:  Discontinued     3.375 g 12.5 mL/hr over 240 Minutes Intravenous Every 8 hours 06/05/17 2211 06/06/17 0911   06/05/17 2200  cefTRIAXone (ROCEPHIN) 1 g in dextrose 5 % 50 mL IVPB  Status:  Discontinued     1 g 100 mL/hr over 30 Minutes Intravenous Every 24 hours 06/05/17 2140 06/05/17 2202   06/05/17 2200  azithromycin (ZITHROMAX) 500 mg in dextrose 5 % 250 mL IVPB  Status:  Discontinued     500 mg 250 mL/hr over 60 Minutes Intravenous Every 24 hours 06/05/17 2140 06/06/17 0911   06/05/17 1600  vancomycin (VANCOCIN) 1,500 mg in sodium chloride 0.9 % 500 mL IVPB     1,500 mg 250 mL/hr over 120 Minutes Intravenous  Once 06/05/17 1542 06/05/17 1855   06/05/17 1515  piperacillin-tazobactam (ZOSYN) IVPB 3.375 g     3.375 g 100 mL/hr over 30 Minutes Intravenous  Once 06/05/17 1512 06/05/17 1557       Objective: Vitals:   06/19/17 2235 06/20/17 1405 06/20/17 2151 06/21/17 0521  BP: 108/61 116/69 113/70 106/71  Pulse: 86 98 91 81  Resp: 18 18 18 18   Temp: 98.6 F (37 C) 98.3 F (36.8 C) 99.3 F (37.4 C) 98.9 F (37.2 C)  TempSrc: Oral Oral Oral Oral  SpO2: 99% 100% 100% 98%  Weight:      Height:        Intake/Output Summary (Last 24 hours) at 06/21/2017 1140 Last data filed at 06/21/2017 0853 Gross per 24 hour  Intake 1140 ml  Output 25 ml  Net 1115 ml   Filed Weights   06/05/17 1407 06/06/17 0844  Weight: 68 kg (150 lb) 70.3 kg (154 lb 15.7 oz)    Examination: General exam: Appears comfortable  HEENT: PERRLA, oral mucosa moist, no sclera icterus or thrush Respiratory system: Clear to auscultation. Respiratory effort normal. Cardiovascular system: S1 & S2 heard, RRR.  No murmurs  Chest: wound vac in upper chest wall Gastrointestinal system: Abdomen soft, non-tender, nondistended. Normal bowel sound. No organomegaly Central nervous system: Alert and oriented. No focal  neurological deficits. Extremities: No cyanosis, clubbing or edema Skin: No rashes or ulcers Psychiatry:  Mood & affect appropriate.     Data Reviewed: I have personally reviewed following labs and imaging studies  CBC: Recent Labs  Lab 06/15/17 0534  WBC 10.1  HGB 11.8*  HCT 36.5*  MCV 92.4  PLT 809*   Basic Metabolic Panel: Recent Labs  Lab 06/16/17 0510 06/17/17 0423 06/18/17 0843 06/19/17 0549 06/20/17 0358  NA 139 137 133* 134* 138  K 4.7 3.9 3.9 4.2 4.0  CL 101 100* 94* 98* 101  CO2 25 26 26 25 26   GLUCOSE 111* 89 143* 104* 95  BUN 10 8 8 9 6   CREATININE 0.53* 0.61 0.67 0.56* 0.44*  CALCIUM 9.7 9.4 9.6 9.4 9.0   GFR: Estimated Creatinine Clearance: 128.2 mL/min (A) (by C-G  formula based on SCr of 0.44 mg/dL (L)). Liver Function Tests: No results for input(s): AST, ALT, ALKPHOS, BILITOT, PROT, ALBUMIN in the last 168 hours. No results for input(s): LIPASE, AMYLASE in the last 168 hours. No results for input(s): AMMONIA in the last 168 hours. Coagulation Profile: No results for input(s): INR, PROTIME in the last 168 hours. Cardiac Enzymes: No results for input(s): CKTOTAL, CKMB, CKMBINDEX, TROPONINI in the last 168 hours. BNP (last 3 results) No results for input(s): PROBNP in the last 8760 hours. HbA1C: No results for input(s): HGBA1C in the last 72 hours. CBG: No results for input(s): GLUCAP in the last 168 hours. Lipid Profile: No results for input(s): CHOL, HDL, LDLCALC, TRIG, CHOLHDL, LDLDIRECT in the last 72 hours. Thyroid Function Tests: No results for input(s): TSH, T4TOTAL, FREET4, T3FREE, THYROIDAB in the last 72 hours. Anemia Panel: No results for input(s): VITAMINB12, FOLATE, FERRITIN, TIBC, IRON, RETICCTPCT in the last 72 hours. Urine analysis:    Component Value Date/Time   COLORURINE YELLOW 06/06/2017 1333   APPEARANCEUR CLEAR 06/06/2017 1333   LABSPEC 1.010 06/06/2017 1333   PHURINE 6.0 06/06/2017 1333   GLUCOSEU NEGATIVE 06/06/2017  1333   HGBUR NEGATIVE 06/06/2017 1333   BILIRUBINUR NEGATIVE 06/06/2017 1333   KETONESUR NEGATIVE 06/06/2017 1333   PROTEINUR NEGATIVE 06/06/2017 1333   UROBILINOGEN 0.2 10/03/2012 0856   NITRITE NEGATIVE 06/06/2017 1333   LEUKOCYTESUR NEGATIVE 06/06/2017 1333   Sepsis Labs: @LABRCNTIP (procalcitonin:4,lacticidven:4) ) No results found for this or any previous visit (from the past 240 hour(s)).       Radiology Studies: No results found.    Scheduled Meds: . enoxaparin (LOVENOX) injection  40 mg Subcutaneous Q24H  . gabapentin  300 mg Oral TID  . iopamidol  20 mL Intra-articular Once  . ketorolac  30 mg Intravenous Q6H  . lidocaine (PF)  5 mL Intradermal Once  . nicotine  21 mg Transdermal Daily  . zinc sulfate  220 mg Oral Daily   Continuous Infusions: . sodium chloride    . sodium chloride 10 mL/hr at 06/19/17 1052  .  ceFAZolin (ANCEF) IV Stopped (06/21/17 0948)  . potassium chloride       LOS: 16 days    Time spent in minutes: 35    Calvert Cantor, MD [facility] Pager: [email or web address] Password [email or web address] 06/21/2017, 11:40 AM

## 2017-06-21 NOTE — Consult Note (Signed)
WOC reviewed chart, per notes appears CVTS will change VAC dressing again on Thursday in the OR. Will not change dressing today. Laiya [facility] [place] MSN,RN,CWOCN, CNS, [employer] [phone]

## 2017-06-22 ENCOUNTER — Inpatient Hospital Stay (HOSPITAL_COMMUNITY): Payer: Self-pay | Admitting: Certified Registered Nurse Anesthetist

## 2017-06-22 ENCOUNTER — Encounter (HOSPITAL_COMMUNITY): Payer: Self-pay | Admitting: Certified Registered Nurse Anesthetist

## 2017-06-22 ENCOUNTER — Encounter (HOSPITAL_COMMUNITY)
Admission: EM | Disposition: A | Discharge status: Home / Self Care | Payer: Self-pay | Source: Home / Self Care | Attending: Internal Medicine

## 2017-06-22 DIAGNOSIS — R0602 Shortness of breath: Secondary | ICD-10-CM

## 2017-06-22 DIAGNOSIS — M86011 Acute hematogenous osteomyelitis, right shoulder: Secondary | ICD-10-CM

## 2017-06-22 HISTORY — PX: APPLICATION OF WOUND VAC: SHX5189

## 2017-06-22 SURGERY — APPLICATION, WOUND VAC
Anesthesia: General

## 2017-06-22 MED ORDER — ONDANSETRON HCL 4 MG/2ML IJ SOLN
INTRAMUSCULAR | Status: DC | PRN
  Administered 2017-06-22: 4 mg via INTRAVENOUS

## 2017-06-22 MED ORDER — FENTANYL CITRATE (PF) 100 MCG/2ML IJ SOLN
INTRAMUSCULAR | Status: DC | PRN
  Administered 2017-06-22: 100 ug via INTRAVENOUS
  Administered 2017-06-22: 50 ug via INTRAVENOUS

## 2017-06-22 MED ORDER — HYDROMORPHONE HCL 1 MG/ML IJ SOLN: 0.2500 mg | INTRAMUSCULAR | Status: DC | PRN

## 2017-06-22 MED ORDER — PHENYLEPHRINE 40 MCG/ML (10ML) SYRINGE FOR IV PUSH (FOR BLOOD PRESSURE SUPPORT)
PREFILLED_SYRINGE | INTRAVENOUS | Status: DC | PRN
  Administered 2017-06-22 (×2): 80 ug via INTRAVENOUS

## 2017-06-22 MED ORDER — MIDAZOLAM HCL 5 MG/5ML IJ SOLN
INTRAMUSCULAR | Status: DC | PRN
  Administered 2017-06-22: 2 mg via INTRAVENOUS

## 2017-06-22 MED ORDER — PROPOFOL 10 MG/ML IV BOLUS
INTRAVENOUS | Status: DC | PRN
  Administered 2017-06-22: 150 mg via INTRAVENOUS

## 2017-06-22 MED ORDER — MIDAZOLAM HCL 2 MG/2ML IJ SOLN
INTRAMUSCULAR | Status: AC
  Filled 2017-06-22: qty 2

## 2017-06-22 MED ORDER — KETAMINE HCL-SODIUM CHLORIDE 100-0.9 MG/10ML-% IV SOSY
PREFILLED_SYRINGE | INTRAVENOUS | Status: AC
  Filled 2017-06-22: qty 10

## 2017-06-22 MED ORDER — HYDROMORPHONE HCL 1 MG/ML IJ SOLN
INTRAMUSCULAR | Status: AC
  Filled 2017-06-22: qty 2

## 2017-06-22 MED ORDER — ONDANSETRON HCL 4 MG/2ML IJ SOLN
INTRAMUSCULAR | Status: AC
  Filled 2017-06-22: qty 2

## 2017-06-22 MED ORDER — DEXAMETHASONE SODIUM PHOSPHATE 10 MG/ML IJ SOLN
INTRAMUSCULAR | Status: DC | PRN
  Administered 2017-06-22: 10 mg via INTRAVENOUS

## 2017-06-22 MED ORDER — PROMETHAZINE HCL 25 MG/ML IJ SOLN: 6.2500 mg | INTRAMUSCULAR | Status: DC | PRN

## 2017-06-22 MED ORDER — DEXAMETHASONE SODIUM PHOSPHATE 10 MG/ML IJ SOLN
INTRAMUSCULAR | Status: AC
  Filled 2017-06-22: qty 1

## 2017-06-22 MED ORDER — PHENYLEPHRINE 40 MCG/ML (10ML) SYRINGE FOR IV PUSH (FOR BLOOD PRESSURE SUPPORT)
PREFILLED_SYRINGE | INTRAVENOUS | Status: AC
  Filled 2017-06-22: qty 10

## 2017-06-22 MED ORDER — ONDANSETRON HCL 4 MG/2ML IJ SOLN
INTRAMUSCULAR | Status: AC
  Filled 2017-06-22: qty 8

## 2017-06-22 MED ORDER — KETAMINE HCL 10 MG/ML IJ SOLN
INTRAMUSCULAR | Status: DC | PRN
  Administered 2017-06-22: 50 mg via INTRAVENOUS

## 2017-06-22 MED ORDER — MEPERIDINE HCL 25 MG/ML IJ SOLN: 6.2500 mg | INTRAMUSCULAR | Status: DC | PRN

## 2017-06-22 MED ORDER — LIDOCAINE 2% (20 MG/ML) 5 ML SYRINGE
INTRAMUSCULAR | Status: DC | PRN
  Administered 2017-06-22: 60 mg via INTRAVENOUS

## 2017-06-22 MED ORDER — SODIUM CHLORIDE 0.9 % IR SOLN
Status: DC | PRN
  Administered 2017-06-22: 1000 mL

## 2017-06-22 MED ORDER — KETOROLAC TROMETHAMINE 30 MG/ML IJ SOLN
30.0000 mg | Freq: Four times a day (QID) | INTRAMUSCULAR | Status: AC
  Administered 2017-06-22 – 2017-06-23 (×3): 30 mg via INTRAVENOUS
  Filled 2017-06-22 (×3): qty 1

## 2017-06-22 MED ORDER — LACTATED RINGERS IV SOLN
INTRAVENOUS | Status: DC | PRN
  Administered 2017-06-22: 10:00:00 via INTRAVENOUS

## 2017-06-22 MED ORDER — LACTATED RINGERS IV SOLN: INTRAVENOUS | Status: DC

## 2017-06-22 MED ORDER — FENTANYL CITRATE (PF) 250 MCG/5ML IJ SOLN
INTRAMUSCULAR | Status: AC
Start: 2017-06-22 — End: ?
  Filled 2017-06-22: qty 5

## 2017-06-22 MED ORDER — DEXAMETHASONE SODIUM PHOSPHATE 10 MG/ML IJ SOLN
INTRAMUSCULAR | Status: AC
  Filled 2017-06-22: qty 2

## 2017-06-22 MED ORDER — LIDOCAINE 2% (20 MG/ML) 5 ML SYRINGE
INTRAMUSCULAR | Status: AC
  Filled 2017-06-22: qty 10

## 2017-06-22 SURGICAL SUPPLY — 58 items
BENZOIN TINCTURE PRP APPL 2/3 (GAUZE/BANDAGES/DRESSINGS) IMPLANT
BLADE SURG 10 STRL SS (BLADE) IMPLANT
BLADE SURG 15 STRL LF DISP TIS (BLADE) IMPLANT
BLADE SURG 15 STRL SS (BLADE)
BNDG GAUZE ELAST 4 BULKY (GAUZE/BANDAGES/DRESSINGS) IMPLANT
CANISTER PREVENA 45 (CANNISTER) IMPLANT
CANISTER SUCT 3000ML PPV (MISCELLANEOUS) ×3 IMPLANT
CATH THORACIC 28FR RT ANG (CATHETERS) IMPLANT
CATH THORACIC 36FR (CATHETERS) IMPLANT
CATH THORACIC 36FR RT ANG (CATHETERS) IMPLANT
CLIP VESOCCLUDE SM WIDE 24/CT (CLIP) IMPLANT
CONT SPEC 4OZ CLIKSEAL STRL BL (MISCELLANEOUS) IMPLANT
DRAPE LAPAROSCOPIC ABDOMINAL (DRAPES) ×3 IMPLANT
DRAPE SLUSH/WARMER DISC (DRAPES) IMPLANT
DRSG AQUACEL AG ADV 3.5X14 (GAUZE/BANDAGES/DRESSINGS) IMPLANT
DRSG CUTIMED SORBACT 7X9 (GAUZE/BANDAGES/DRESSINGS) ×3 IMPLANT
DRSG PAD ABDOMINAL 8X10 ST (GAUZE/BANDAGES/DRESSINGS) IMPLANT
DRSG VAC ATS SM SENSATRAC (GAUZE/BANDAGES/DRESSINGS) ×3 IMPLANT
ELECT REM PT RETURN 9FT ADLT (ELECTROSURGICAL) ×3
ELECTRODE REM PT RTRN 9FT ADLT (ELECTROSURGICAL) ×1 IMPLANT
GAUZE SPONGE 4X4 12PLY STRL (GAUZE/BANDAGES/DRESSINGS) IMPLANT
GAUZE XEROFORM 5X9 LF (GAUZE/BANDAGES/DRESSINGS) IMPLANT
GLOVE BIO SURGEON STRL SZ7.5 (GLOVE) ×6 IMPLANT
GOWN STRL REUS W/ TWL LRG LVL3 (GOWN DISPOSABLE) ×2 IMPLANT
GOWN STRL REUS W/TWL LRG LVL3 (GOWN DISPOSABLE) ×4
HANDPIECE INTERPULSE COAX TIP (DISPOSABLE) ×2
HEMOSTAT POWDER SURGIFOAM 1G (HEMOSTASIS) IMPLANT
HEMOSTAT SURGICEL 2X14 (HEMOSTASIS) IMPLANT
KIT BASIN OR (CUSTOM PROCEDURE TRAY) ×3 IMPLANT
KIT DRSG VAC SLVR GRANUFM (MISCELLANEOUS) IMPLANT
KIT ROOM TURNOVER OR (KITS) ×3 IMPLANT
KIT SUCTION CATH 14FR (SUCTIONS) IMPLANT
MICROMATRIX 500MG (Tissue) ×3 IMPLANT
NS IRRIG 1000ML POUR BTL (IV SOLUTION) ×3 IMPLANT
PACK CHEST (CUSTOM PROCEDURE TRAY) ×3 IMPLANT
PAD ARMBOARD 7.5X6 YLW CONV (MISCELLANEOUS) ×6 IMPLANT
SET HNDPC FAN SPRY TIP SCT (DISPOSABLE) ×1 IMPLANT
SOLUTION PARTIC MCRMTRX 500MG (Tissue) ×1 IMPLANT
SPONGE LAP 18X18 X RAY DECT (DISPOSABLE) ×3 IMPLANT
STAPLER VISISTAT 35W (STAPLE) IMPLANT
STRAP MONTGOMERY 1.25X11-1/8 (MISCELLANEOUS) IMPLANT
SUT ETHILON 3 0 FSL (SUTURE) IMPLANT
SUT STEEL 6MS V (SUTURE) IMPLANT
SUT STEEL STERNAL CCS#1 18IN (SUTURE) IMPLANT
SUT STEEL SZ 6 DBL 3X14 BALL (SUTURE) IMPLANT
SUT VIC AB 1 CTX 36 (SUTURE)
SUT VIC AB 1 CTX36XBRD ANBCTR (SUTURE) IMPLANT
SUT VIC AB 2-0 CTX 27 (SUTURE) IMPLANT
SUT VIC AB 3-0 SH 8-18 (SUTURE) ×3 IMPLANT
SUT VIC AB 3-0 X1 27 (SUTURE) IMPLANT
SWAB COLLECTION DEVICE MRSA (MISCELLANEOUS) IMPLANT
SWAB CULTURE ESWAB REG 1ML (MISCELLANEOUS) IMPLANT
SYR 5ML LL (SYRINGE) IMPLANT
TOWEL GREEN STERILE (TOWEL DISPOSABLE) ×3 IMPLANT
TOWEL GREEN STERILE FF (TOWEL DISPOSABLE) ×3 IMPLANT
TRAY FOLEY W/METER SILVER 16FR (SET/KITS/TRAYS/PACK) IMPLANT
WATER STERILE IRR 1000ML POUR (IV SOLUTION) ×3 IMPLANT
WND VAC CANISTER 500ML (MISCELLANEOUS) ×3 IMPLANT

## 2017-06-22 NOTE — Progress Notes (Signed)
Pre Procedure note for inpatients:   Clifford Tucker has been scheduled for Procedure(s): WOUND VAC CHANGE (N/A) today. The various methods of treatment have been discussed with the patient. After consideration of the risks, benefits and treatment options the patient has consented to the planned procedure.   The patient has been seen and labs reviewed. There are no changes in the patient's condition to prevent proceeding with the planned procedure today.  Recent labs:  Lab Results  Component Value Date   WBC 10.1 06/15/2017   HGB 11.8 (L) 06/15/2017   HCT 36.5 (L) 06/15/2017   PLT 809 (H) 06/15/2017   GLUCOSE 95 06/20/2017   ALT 36 06/09/2017   AST 42 (H) 06/09/2017   NA 138 06/20/2017   K 4.0 06/20/2017   CL 101 06/20/2017   CREATININE 0.44 (L) 06/20/2017   BUN 6 06/20/2017   CO2 26 06/20/2017   INR 1.11 06/07/2017    Clifford BussingPeter Van Trigt III, MD 06/22/2017 9:13 AM

## 2017-06-22 NOTE — Anesthesia Postprocedure Evaluation (Signed)
Anesthesia Post Note  Patient: Lance BoschChristopher Babinski  Procedure(s) Performed: Irrigation of chest wall wound with WOUND VAC CHANGE (N/A )     Patient location during evaluation: PACU Anesthesia Type: General Level of consciousness: awake and alert Pain management: pain level controlled Vital Signs Assessment: post-procedure vital signs reviewed and stable Respiratory status: spontaneous breathing, nonlabored ventilation, respiratory function stable and patient connected to nasal cannula oxygen Cardiovascular status: blood pressure returned to baseline and stable Postop Assessment: no apparent nausea or vomiting Anesthetic complications: no    Last Vitals:  Vitals:   06/22/17 1133 06/22/17 1345  BP: 110/74 111/66  Pulse: 80 93  Resp: 13 16  Temp:  36.8 C  SpO2: 100% 100%    Last Pain:  Vitals:   06/22/17 1345  TempSrc: Oral  PainSc:                  Shelton SilvasKevin D Hollis

## 2017-06-22 NOTE — Anesthesia Preprocedure Evaluation (Addendum)
Anesthesia Evaluation  Patient identified by MRN, date of birth, ID band Patient awake    Reviewed: Allergy & Precautions, NPO status , Patient's Chart, lab work & pertinent test results  Airway Mallampati: II  TM Distance: >3 FB Neck ROM: Full    Dental  (+) Teeth Intact, Dental Advisory Given   Pulmonary Current Smoker,    breath sounds clear to auscultation       Cardiovascular negative cardio ROS   Rhythm:Regular Rate:Normal     Neuro/Psych  Neuromuscular disease    GI/Hepatic negative GI ROS, Neg liver ROS,   Endo/Other  negative endocrine ROS  Renal/GU negative Renal ROS  negative genitourinary   Musculoskeletal  (+) Arthritis , Osteoarthritis,    Abdominal Normal abdominal exam  (+)   Peds  Hematology negative hematology ROS (+)   Anesthesia Other Findings   Reproductive/Obstetrics                            Lab Results  Component Value Date   WBC 10.1 06/15/2017   HGB 11.8 (L) 06/15/2017   HCT 36.5 (L) 06/15/2017   MCV 92.4 06/15/2017   PLT 809 (H) 06/15/2017   Lab Results  Component Value Date   CREATININE 0.44 (L) 06/20/2017   BUN 6 06/20/2017   NA 138 06/20/2017   K 4.0 06/20/2017   CL 101 06/20/2017   CO2 26 06/20/2017   Lab Results  Component Value Date   INR 1.11 06/07/2017   Echo: - Left ventricle: The cavity size was normal. There was mild   concentric hypertrophy. Systolic function was normal. The   estimated ejection fraction was in the range of 60% to 65%. Left   ventricular diastolic function parameters were normal. - Aortic valve: There was no regurgitation. - Mitral valve: There was mild regurgitation. - Right ventricle: The cavity size was normal. Wall thickness was   normal. Systolic function was normal. - Right atrium: The atrium was normal in size. - Tricuspid valve: There was trivial regurgitation. - Pulmonic valve: There was no  regurgitation. - Inferior vena cava: The vessel was normal in size. The   respirophasic diameter changes were in the normal range (= 50%),   consistent with normal central venous pressure. - Pericardium, extracardiac: The pericardium was normal in   appearance.    Anesthesia Physical Anesthesia Plan  ASA: III  Anesthesia Plan: General   Post-op Pain Management:    Induction: Intravenous  PONV Risk Score and Plan: 2 and Ondansetron and Dexamethasone  Airway Management Planned: Oral ETT  Additional Equipment: None  Intra-op Plan:   Post-operative Plan: Extubation in OR  Informed Consent: I have reviewed the patients History and Physical, chart, labs and discussed the procedure including the risks, benefits and alternatives for the proposed anesthesia with the patient or authorized representative who has indicated his/her understanding and acceptance.   Dental advisory given  Plan Discussed with: CRNA  Anesthesia Plan Comments:         Anesthesia Quick Evaluation

## 2017-06-22 NOTE — Anesthesia Procedure Notes (Signed)
Procedure Name: LMA Insertion Date/Time: 06/22/2017 10:19 AM Performed by: Waynard EdwardsSmith, Eufelia Veno A, CRNA Pre-anesthesia Checklist: Patient identified, Emergency Drugs available, Suction available and Patient being monitored Patient Re-evaluated:Patient Re-evaluated prior to induction Oxygen Delivery Method: Circle system utilized Preoxygenation: Pre-oxygenation with 100% oxygen Induction Type: IV induction Ventilation: Mask ventilation without difficulty LMA: LMA inserted LMA Size: 4.0 Number of attempts: 1 Placement Confirmation: positive ETCO2 and breath sounds checked- equal and bilateral Tube secured with: Tape Dental Injury: Teeth and Oropharynx as per pre-operative assessment

## 2017-06-22 NOTE — Progress Notes (Signed)
Clifford came to pt.'s room on evening rounds during an over night shift. Clifford Clifford Tucker and Clifford Tucker were also present as they were shadowing on this evening. Clifford Clifford Tucker entered patient's room and identified himself and offered greeting.. [name] brought visit to an abrupt end by raising his hand and clearly stating "I am not interested." At this time Clifford said good evening, thanked pt. for his time and exited. I suspect that the invitation to leave was more about suspicion of the title and perhaps mistreatment or judgement from "religious figures or institutions."  Clifford Tucker will follow up with a non-threatening explanation of what a Clifford does and does not do (proselytize attempt to convince or convert). Clifford Tucker has the inclination that [name] could benefit from a conversation with an empathic listening, supportive, non-judemental person.

## 2017-06-22 NOTE — Progress Notes (Signed)
Pt transfer to OR.

## 2017-06-22 NOTE — Transfer of Care (Signed)
Immediate Anesthesia Transfer of Care Note  Patient: Clifford Tucker  Procedure(s) Performed: Irrigation of chest wall wound with WOUND VAC CHANGE (N/A )  Patient Location: PACU  Anesthesia Type:General  Level of Consciousness: drowsy  Airway & Oxygen Therapy: Patient Spontanous Breathing and Patient connected to face mask oxygen  Post-op Assessment: Report given to RN and Post -op Vital signs reviewed and stable  Post vital signs: Reviewed and stable  Last Vitals:  Vitals:   06/22/17 0553 06/22/17 0859  BP: 108/73 108/68  Pulse: 75 80  Resp: 18 18  Temp: 36.8 C 36.6 C  SpO2: 98% 100%    Last Pain:  Vitals:   06/22/17 0859  TempSrc: Oral  PainSc:       Patients Stated Pain Goal: 2 (06/20/17 1238)  Complications: No apparent anesthesia complications

## 2017-06-22 NOTE — Progress Notes (Signed)
PROGRESS NOTE    Clifford Tucker   ZOX:[id number]  DOB: February 13, 1981  DOA: 06/05/2017 PCP: Patient, No Pcp Per   Brief Narrative:  Clifford Tucker a 37 y.o.malewith medical history significant forIV drug abuse and recurrent skin and soft tissue infections. He presented with neck/hip pain found to have a neck abscess, septic arthritis of the hip and now MSSA bacteremia. CT surgery, orthopedic surgery and infectious disease consulted.   Subjective: Pt seen and examined at his bedside. He has no new complaints. Has wound vac in place on his neck. Pain is well controlled.  Assessment & Plan:   Bacteremia due to methicillin susceptible Staphylococcus aureus (MSSA) in the setting of IV drug use  -stable, no changes, afebrile, normotensive. -Blood cultures 2/2+ for MSSA, last heroin use a week ago -works as a Nutritional therapistplumber with dirty pipes -Patient was originally placed on vancomycin and Zosyn, transition to IV Ancef -Sternal aspirate showed MSSA, left hip wound cultures also MSSA -2D echo with no vegetation.  - ID following, recommended continue cefazolin for total 6 wks -   PICC placed- he will either go to SNF or complete treatment in the hospital- cannot go home with PICC - pt request dose of oral pain med increased and IV pain med stopped -will continue current pain management due to minimal pain reported by the patient and we will attempt to wean off  -with hx of drug addiction, when procedures completed we will consider suboxone regimen -pt states he has participated in suboxone treatment in the past and he welcomes the idea.  Left hip abscess/iliopsoas septic arthritis, infectious myositis -Status post left hip I&D with cultures done on 1/15, showing staph aureus -Status post drain placement 1/18 for iliacus abscess which was removed on 06/14/17 -Doppler ultrasound of the left lower extremity negative for DVT  Septic arthritis of the sternoclavicular joint, abscess  -June 15, 2017 :status post irrigation of the right sternoclavicular wound, with a wound VAC placement -Anticipating reevaluation of wound VAC and further irrigation and cleaning next week. Cardiothoracic surgery team recommending wound VAC change Mondays, Wednesdays, Fridays  Starting Monday, January 28, continue antibiotics -irrigation of chest wall wound with wound vac change 06/22/17.  Multifocal pneumonia -ID following, continue cefazolin    IV drug user -Per patient, has been using heroin, [facility] but relapsed again in a month before the admission -Last heroin use a week ago PTA, so far not in any withdrawals -Social work consulted for rehab/SNF placement   Nicotine abuse -Nicotine patch placed   DVT prophylaxis: Lovenox Code Status: Full code Family Communication:  None at bedside Disposition Plan: to be determined Consultants:   CT surgery, ID Procedures:     Antimicrobials:  Anti-infectives (From admission, onward)   Start     Dose/Rate Route Frequency Ordered Stop   06/15/17 0816  vancomycin (VANCOCIN) powder  Status:  Discontinued       As needed 06/15/17 0817 06/15/17 0854   06/07/17 1400  cefUROXime (ZINACEF) 1.5 g in dextrose 5 % 50 mL IVPB     1.5 g 100 mL/hr over 30 Minutes Intravenous To [facility]Stay Surgical 06/07/17 0125 06/07/17 1527   06/07/17 0745  vancomycin (VANCOCIN) 1,000 mg in sodium chloride 0.9 % 1,000 mL irrigation      Irrigation To Surgery 06/07/17 0744 06/07/17 1513   06/06/17 1639  gentamicin (GARAMYCIN) injection  Status:  Discontinued       As needed 06/06/17 1640 06/06/17 1715   06/06/17  1638  vancomycin (VANCOCIN) powder  Status:  Discontinued       As needed 06/06/17 1639 06/06/17 1715   06/06/17 1000  ceFAZolin (ANCEF) IVPB 2g/100 mL premix     2 g 200 mL/hr over 30 Minutes Intravenous Every 8 hours 06/06/17 0911     06/06/17 0000  vancomycin (VANCOCIN) IVPB 1000 mg/200 mL premix  Status:  Discontinued     1,000  mg 200 mL/hr over 60 Minutes Intravenous Every 8 hours 06/05/17 2211 06/06/17 0911   06/05/17 2330  piperacillin-tazobactam (ZOSYN) IVPB 3.375 g  Status:  Discontinued     3.375 g 12.5 mL/hr over 240 Minutes Intravenous Every 8 hours 06/05/17 2211 06/06/17 0911   06/05/17 2200  cefTRIAXone (ROCEPHIN) 1 g in dextrose 5 % 50 mL IVPB  Status:  Discontinued     1 g 100 mL/hr over 30 Minutes Intravenous Every 24 hours 06/05/17 2140 06/05/17 2202   06/05/17 2200  azithromycin (ZITHROMAX) 500 mg in dextrose 5 % 250 mL IVPB  Status:  Discontinued     500 mg 250 mL/hr over 60 Minutes Intravenous Every 24 hours 06/05/17 2140 06/06/17 0911   06/05/17 1600  vancomycin (VANCOCIN) 1,500 mg in sodium chloride 0.9 % 500 mL IVPB     1,500 mg 250 mL/hr over 120 Minutes Intravenous  Once 06/05/17 1542 06/05/17 1855   06/05/17 1515  piperacillin-tazobactam (ZOSYN) IVPB 3.375 g     3.375 g 100 mL/hr over 30 Minutes Intravenous  Once 06/05/17 1512 06/05/17 1557       Objective: Vitals:   06/21/17 1434 06/21/17 2126 06/22/17 0553 06/22/17 0859  BP: (!) 141/72 105/71 108/73 108/68  Pulse: 89 81 75 80  Resp:  18 18 18   Temp: 97.9 F (36.6 C) 98.6 F (37 C) 98.2 F (36.8 C) 97.9 F (36.6 C)  TempSrc: Oral Oral Oral Oral  SpO2: 99% 99% 98% 100%  Weight:      Height:        Intake/Output Summary (Last 24 hours) at 06/22/2017 0909 Last data filed at 06/22/2017 0553 Gross per 24 hour  Intake 220 ml  Output 0 ml  Net 220 ml   Filed Weights   06/05/17 1407 06/06/17 0844  Weight: 68 kg (150 lb) 70.3 kg (154 lb 15.7 oz)    Examination: 06/22/17 patient seen and examined General exam: Appears comfortable  HEENT: PERRLA, oral mucosa moist, no sclera icterus or thrush Respiratory system: Clear to auscultation. Respiratory effort normal. Cardiovascular system: S1 & S2 heard, RRR.  No murmurs  Chest: wound vac in upper chest wall Gastrointestinal system: Abdomen soft, non-tender, nondistended. Normal  bowel sound. No organomegaly Central nervous system: Alert and oriented. No focal neurological deficits. Extremities: No cyanosis, clubbing or edema Skin: No rashes or ulcers Psychiatry:  Mood & affect appropriate.     Data Reviewed: I have personally reviewed following labs and imaging studies  CBC: No results for input(s): WBC, NEUTROABS, HGB, HCT, MCV, PLT in the last 168 hours. Basic Metabolic Panel: Recent Labs  Lab 06/16/17 0510 06/17/17 0423 06/18/17 0843 06/19/17 0549 06/20/17 0358  NA 139 137 133* 134* 138  K 4.7 3.9 3.9 4.2 4.0  CL 101 100* 94* 98* 101  CO2 25 26 26 25 26   GLUCOSE 111* 89 143* 104* 95  BUN 10 8 8 9 6   CREATININE 0.53* 0.61 0.67 0.56* 0.44*  CALCIUM 9.7 9.4 9.6 9.4 9.0   GFR: Estimated Creatinine Clearance: 128.2 mL/min (A) (by C-G  formula based on SCr of 0.44 mg/dL (L)). Liver Function Tests: No results for input(s): AST, ALT, ALKPHOS, BILITOT, PROT, ALBUMIN in the last 168 hours. No results for input(s): LIPASE, AMYLASE in the last 168 hours. No results for input(s): AMMONIA in the last 168 hours. Coagulation Profile: No results for input(s): INR, PROTIME in the last 168 hours. Cardiac Enzymes: No results for input(s): CKTOTAL, CKMB, CKMBINDEX, TROPONINI in the last 168 hours. BNP (last 3 results) No results for input(s): PROBNP in the last 8760 hours. HbA1C: No results for input(s): HGBA1C in the last 72 hours. CBG: No results for input(s): GLUCAP in the last 168 hours. Lipid Profile: No results for input(s): CHOL, HDL, LDLCALC, TRIG, CHOLHDL, LDLDIRECT in the last 72 hours. Thyroid Function Tests: No results for input(s): TSH, T4TOTAL, FREET4, T3FREE, THYROIDAB in the last 72 hours. Anemia Panel: No results for input(s): VITAMINB12, FOLATE, FERRITIN, TIBC, IRON, RETICCTPCT in the last 72 hours. Urine analysis:    Component Value Date/Time   COLORURINE YELLOW 06/06/2017 1333   APPEARANCEUR CLEAR 06/06/2017 1333   LABSPEC 1.010  06/06/2017 1333   PHURINE 6.0 06/06/2017 1333   GLUCOSEU NEGATIVE 06/06/2017 1333   HGBUR NEGATIVE 06/06/2017 1333   BILIRUBINUR NEGATIVE 06/06/2017 1333   KETONESUR NEGATIVE 06/06/2017 1333   PROTEINUR NEGATIVE 06/06/2017 1333   UROBILINOGEN 0.2 10/03/2012 0856   NITRITE NEGATIVE 06/06/2017 1333   LEUKOCYTESUR NEGATIVE 06/06/2017 1333   Sepsis Labs: @LABRCNTIP (procalcitonin:4,lacticidven:4) ) No results found for this or any previous visit (from the past 240 hour(s)).       Radiology Studies: No results found.    Scheduled Meds: . enoxaparin (LOVENOX) injection  40 mg Subcutaneous Q24H  . gabapentin  300 mg Oral TID  . iopamidol  20 mL Intra-articular Once  . lidocaine (PF)  5 mL Intradermal Once  . nicotine  21 mg Transdermal Daily  . zinc sulfate  220 mg Oral Daily   Continuous Infusions: . sodium chloride    . sodium chloride 10 mL/hr at 06/19/17 1052  .  ceFAZolin (ANCEF) IV 2 g (06/22/17 0859)  . potassium chloride       LOS: 17 days    Time spent in minutes: 35    Darlin Drop, MD [facility] Pager: [email or web address] Password [email or web address] 06/22/2017, 9:09 AM

## 2017-06-22 NOTE — Progress Notes (Signed)
Pt for application of wound vac report given to Clifford Tucker, NPO midngiht, PCR negative, CHG bath done, alert and oriented, given dilaudid prior to transfer, wife at the bedside, with PICC line on KVO.

## 2017-06-22 NOTE — Brief Op Note (Addendum)
06/22/2017  11:16 AM  PATIENT:  Clifford Tucker  37 y.o. male  PRE-OPERATIVE DIAGNOSIS:  [place] JOINT WOUND  POST-OPERATIVE DIAGNOSIS:  [place] JOINT WOUND  PROCEDURE:  Procedure(s): Irrigation of chest wall wound with WOUND VAC CHANGE (N/A) Application of Acell powder SURGEON:  Surgeon(s) and Role:    Kerin Perna* Van Trigt, Tenelle Andreason, MD - Primary  PHYSICIAN ASSISTANT:   ASSISTANTS: none   ANESTHESIA:   general  EBL:  3 cc  BLOOD ADMINISTERED:none  DRAINS: none   LOCAL MEDICATIONS USED:  NONE  SPECIMEN:  No Specimen  DISPOSITION OF SPECIMEN:  N/A  COUNTS:  YES  TOURNIQUET:  * No tourniquets in log *  DICTATION: .Dragon Dictation  PLAN OF CARE: return to room  PATIENT DISPOSITION:  PACU - hemodynamically stable.   Delay start of Pharmacological VTE agent (>24hrs) due to surgical blood loss or risk of bleeding: yes  Findings:  Wound clean and granulating- expect one more week of in hospital vac therapy then ok to transfer to SNF and followup in my office.

## 2017-06-23 ENCOUNTER — Encounter (HOSPITAL_COMMUNITY): Payer: Self-pay | Admitting: Cardiothoracic Surgery

## 2017-06-23 LAB — CBC
HEMATOCRIT: 32.8 % — AB (ref 39.0–52.0)
HEMOGLOBIN: 11 g/dL — AB (ref 13.0–17.0)
MCH: 30.5 pg (ref 26.0–34.0)
MCHC: 33.5 g/dL (ref 30.0–36.0)
MCV: 90.9 fL (ref 78.0–100.0)
PLATELETS: 520 10*3/uL — AB (ref 150–400)
RBC: 3.61 MIL/uL — AB (ref 4.22–5.81)
RDW: 14 % (ref 11.5–15.5)
WBC: 15.1 10*3/uL — ABNORMAL HIGH (ref 4.0–10.5)

## 2017-06-23 NOTE — Social Work (Signed)
CSW attempted full assessment, pt not in room.  CSW aware that pt has 1 more week of a wound vac recommended then will need SNF stay to complete iv abx.   CSW will attempt assessment later as time allows.   Doy HutchingIsabel H Daouda Lonzo, LCSWA [facility] Clinical Social Work [phone]

## 2017-06-23 NOTE — Progress Notes (Addendum)
      [address]       [place] [id number]             [phone]        1 Day Post-Op Procedure(s) (LRB): Irrigation of chest wall wound with WOUND VAC CHANGE (N/A)  Subjective: Patient refusing Nicotine patch. He states IV pain medication is not as effective as oral and requesting IV narcotic for breakthrough pain and more oral.  Objective: Vital signs in last 24 hours: Temp:  [97.9 F (36.6 C)-98.4 F (36.9 C)] 97.9 F (36.6 C) (02/01 0537) Pulse Rate:  [80-98] 92 (02/01 0537) Cardiac Rhythm: Normal sinus rhythm (01/31 1133) Resp:  [13-22] 16 (02/01 0537) BP: (99-130)/(65-85) 130/85 (02/01 0537) SpO2:  [98 %-100 %] 100 % (02/01 0537)   Current Weight  06/06/17 154 lb 15.7 oz (70.3 kg)      Intake/Output from previous day: 01/31 0701 - 02/01 0700 In: 1754 [P.O.:1044; I.V.:710] Out: 10 [Blood:10]   Physical Exam:  Cardiovascular: RRR Pulmonary: Clear to auscultation bilaterally Wounds: VAC in place  Lab Results: CBC:No results for input(s): WBC, HGB, HCT, PLT in the last 72 hours. BMET: No results for input(s): NA, K, CL, CO2, GLUCOSE, BUN, CREATININE, CALCIUM in the last 72 hours.  PT/INR:  Lab Results  Component Value Date   INR 1.11 06/07/2017   ABG:  INR: Will add last result for INR, ABG once components are confirmed Will add last 4 CBG results once components are confirmed  Assessment/Plan:  1. CV - SR 2.  ID-On Cafazolin for MSSA Staph Aureus bacteremia and right sternoclavicular joint. S/p I and D, wound vac change, and A cell yesterday. 3. Stop Nicotine patch. Regarding pain management, will discuss with Dr. Donata ClayVan Trigt given his history of substance abuse. 4. Continue present management   Donielle M ZimmermanPA-C 06/23/2017,10:06 AM The wound is improving and after the next wound exam under anesthesia he will probably be leaving the hospital to SNF for wound packing , IV antibiotics He needs to wean narcotics and not  start iv administration prior to discharge  patient examined and medical record reviewed,agree with above note. [name] 06/23/2017

## 2017-06-23 NOTE — Op Note (Signed)
NAME:  Lance BoschBOONE, Bradley                ACCOUNT NO.:  MEDICAL RECORD NO.:  [id number]  LOCATION:                                 FACILITY:  PHYSICIAN:  Kerin PernaPeter Van Trigt, M.D.       DATE OF BIRTH:  DATE OF PROCEDURE:  06/22/2017 DATE OF DISCHARGE:                              OPERATIVE REPORT   OPERATIONS: 1. Wound VAC change of right sternoclavicular joint. 2. Irrigation and application of ACell powder to right     sternoclavicular joint.  SURGEON:  Kerin PernaPeter Van Trigt, MD.  ANESTHESIA:  General.  PREOPERATIVE DIAGNOSIS:  Methicillin-susceptible Staphylococcus aureus infection of the sternoclavicular joint related to bacteremia from IV drug abuse.  POSTOPERATIVE DIAGNOSIS:  Methicillin-susceptible Staphylococcus aureus infection of the sternoclavicular joint related to bacteremia from IV drug abuse.  DESCRIPTION OF PROCEDURE:  The patient was brought from the preoperative holding area to the operating room after the procedure had been fully explained.  The proper site was marked and informed consent was obtained.  General anesthesia was induced after the patient was placed on the operating room table and the right wound VAC sponge was removed. The right chest was then prepped and draped as a sterile field.  A proper time-out was performed.  The wound was inspected.  It had healed in significantly.  It was cleaned and no debridement or excision was needed.  The Sorbact membrane was removed.  The wound was irrigated with pulse lavage.  500 mg of ACell powder was then placed into the wound.  A new membrane of Sorbact was applied over the powder and secured to the wound with interrupted 3- 0 Vicryl sutures.  A small wound VAC sponge was then configured to the right size and shape and placed in the wound, covered with sterile sheets, and connected with the suction tubing to the wound VAC suction canister.  The patient was reversed from anesthesia and returned to the recovery room  in stable condition.     Kerin PernaPeter Van Trigt, M.D.     PV/MEDQ  D:  06/22/2017  T:  06/22/2017  Job:  [id number]

## 2017-06-23 NOTE — Progress Notes (Signed)
PROGRESS NOTE    Lance BoschChristopher Connelly   ZOX:[id number]  DOB: May 19, 1981  DOA: 06/05/2017 PCP: Patient, No Pcp Per   Brief Narrative:  Clifford Fettershristopher Booneis a 37 y.o.malewith medical history significant forIV drug abuse and recurrent skin and soft tissue infections. He presented with neck/hip pain found to have a neck abscess, septic arthritis of the hip and now MSSA bacteremia. CT surgery, orthopedic surgery and infectious disease consulted.  06/22/17: Irrigation of chest wall with wound vac change.  Subjective: 06/23/17: Patient seen and examined at his bedside. Has no new complaints. Surgery would like to keep in hospital for 1 more week with wound vac. Social worker is working on placement to SNF for discharge planning. IV drug user. Would not discharge to home with PICC line.  06/23/17: Left hip mildly swollen at site of sutures; no erythema or warmth noted; patient reports some discomfort at site when he ambulates. Surgery following.  Assessment & Plan:   Bacteremia due to methicillin susceptible Staphylococcus aureus (MSSA) in the setting of IV drug use  -stable, no changes, afebrile, normotensive. -Blood cultures 2/2+ for MSSA, last heroin use a week ago -works as a Nutritional therapistplumber with dirty pipes -Patient was originally placed on vancomycin and Zosyn, transition to IV Ancef -Sternal aspirate showed MSSA, left hip wound cultures also MSSA -2D echo with no vegetation.  - ID following, recommended continue cefazolin for total 6 wks -   PICC placed- he will either go to SNF or complete treatment in the hospital- cannot go home with PICC - pt request dose of oral pain med increased and IV pain med stopped -will continue current pain management due to minimal pain reported by the patient and we will attempt to wean off  -with hx of drug addiction, when procedures completed we will consider suboxone treatment -pt states he has participated in suboxone treatment in the past and he welcomes the  idea.  Left hip abscess/iliopsoas septic arthritis, infectious myositis -Status post left hip I&D with cultures done on 1/15, showing staph aureus -Status post drain placement 1/18 for iliacus abscess which was removed on 06/14/17 -Doppler ultrasound of the left lower extremity negative for DVT -06/23/17: Left hip mildly swollen at site of sutures; no erythema or warmth noted -surgery following  Septic arthritis of the sternoclavicular joint, abscess  -June 15, 2017 :status post irrigation of the right sternoclavicular wound, with a wound VAC placement -Anticipating reevaluation of wound VAC and further irrigation and cleaning next week. Cardiothoracic surgery team recommending wound VAC change Mondays, Wednesdays, Fridays  Starting Monday, January 28, continue antibiotics -irrigation of chest wall wound with wound vac change 06/22/17.  Multifocal pneumonia -ID following, continue cefazolin    IV drug user -Per patient, has been using heroin, [facility] but relapsed again in a month before the admission -Last heroin use a week ago PTA, so far not in any withdrawals -polysubstance abuse counseling -start suboxone when procedures are completed -Social work consulted for rehab/SNF placement  -06/23/17: Social worker optimistic about finding placement to SNF  Nicotine abuse -Nicotine patch -Tobacco cessation counseling   DVT prophylaxis: sq Lovenox Code Status: Full code Family Communication:  None at bedside Disposition Plan: to be determined Consultants:   CT surgery, ID Procedures:     Irrigation of chest wall with wound vac change 06/22/17. Antimicrobials:  Anti-infectives (From admission, onward)   Start     Dose/Rate Route Frequency Ordered Stop   06/15/17 0816  vancomycin (VANCOCIN) powder  Status:  Discontinued       As needed 06/15/17 0817 06/15/17 0854   06/07/17 1400  cefUROXime (ZINACEF) 1.5 g in dextrose 5 % 50 mL IVPB     1.5 g 100  mL/hr over 30 Minutes Intravenous To ShortStay Surgical 06/07/17 0125 06/07/17 1527   06/07/17 0745  vancomycin (VANCOCIN) 1,000 mg in sodium chloride 0.9 % 1,000 mL irrigation      Irrigation To Surgery 06/07/17 0744 06/07/17 1513   06/06/17 1639  gentamicin (GARAMYCIN) injection  Status:  Discontinued       As needed 06/06/17 1640 06/06/17 1715   06/06/17 1638  vancomycin (VANCOCIN) powder  Status:  Discontinued       As needed 06/06/17 1639 06/06/17 1715   06/06/17 1000  ceFAZolin (ANCEF) IVPB 2g/100 mL premix     2 g 200 mL/hr over 30 Minutes Intravenous Every 8 hours 06/06/17 0911     06/06/17 0000  vancomycin (VANCOCIN) IVPB 1000 mg/200 mL premix  Status:  Discontinued     1,000 mg 200 mL/hr over 60 Minutes Intravenous Every 8 hours 06/05/17 2211 06/06/17 0911   06/05/17 2330  piperacillin-tazobactam (ZOSYN) IVPB 3.375 g  Status:  Discontinued     3.375 g 12.5 mL/hr over 240 Minutes Intravenous Every 8 hours 06/05/17 2211 06/06/17 0911   06/05/17 2200  cefTRIAXone (ROCEPHIN) 1 g in dextrose 5 % 50 mL IVPB  Status:  Discontinued     1 g 100 mL/hr over 30 Minutes Intravenous Every 24 hours 06/05/17 2140 06/05/17 2202   06/05/17 2200  azithromycin (ZITHROMAX) 500 mg in dextrose 5 % 250 mL IVPB  Status:  Discontinued     500 mg 250 mL/hr over 60 Minutes Intravenous Every 24 hours 06/05/17 2140 06/06/17 0911   06/05/17 1600  vancomycin (VANCOCIN) 1,500 mg in sodium chloride 0.9 % 500 mL IVPB     1,500 mg 250 mL/hr over 120 Minutes Intravenous  Once 06/05/17 1542 06/05/17 1855   06/05/17 1515  piperacillin-tazobactam (ZOSYN) IVPB 3.375 g     3.375 g 100 mL/hr over 30 Minutes Intravenous  Once 06/05/17 1512 06/05/17 1557       Objective: Vitals:   06/22/17 1704 06/22/17 2202 06/23/17 0152 06/23/17 0537  BP: 101/65 106/65 108/71 130/85  Pulse: 93 98 91 92  Resp: 16 18 18 16   Temp: 98.4 F (36.9 C) 97.9 F (36.6 C) 97.9 F (36.6 C) 97.9 F (36.6 C)  TempSrc: Oral Oral Oral  Oral  SpO2: 100% 98% 99% 100%  Weight:      Height:        Intake/Output Summary (Last 24 hours) at 06/23/2017 0726 Last data filed at 06/23/2017 0540 Gross per 24 hour  Intake 1754 ml  Output 10 ml  Net 1744 ml   Filed Weights   06/05/17 1407 06/06/17 0844  Weight: 68 kg (150 lb) 70.3 kg (154 lb 15.7 oz)    Examination: 06/23/17 patient seen and examined General exam: Appears comfortable  HEENT: PERRLA, oral mucosa moist, no sclera icterus or thrush Respiratory system: Clear to auscultation. Respiratory effort normal. Cardiovascular system: S1 & S2 heard, RRR.  No murmurs  Chest: wound vac in upper chest wall Gastrointestinal system: Abdomen soft, non-tender, nondistended. Normal bowel sound. No organomegaly Central nervous system: Alert and oriented. No focal neurological deficits. Extremities: No cyanosis, clubbing or edema Skin: left hip is mildly swollen below sutures with no erythema. Psychiatry:  Mood & affect appropriate.     Data Reviewed: I  have personally reviewed following labs and imaging studies  CBC: No results for input(s): WBC, NEUTROABS, HGB, HCT, MCV, PLT in the last 168 hours. Basic Metabolic Panel: Recent Labs  Lab 06/17/17 0423 06/18/17 0843 06/19/17 0549 06/20/17 0358  NA 137 133* 134* 138  K 3.9 3.9 4.2 4.0  CL 100* 94* 98* 101  CO2 26 26 25 26   GLUCOSE 89 143* 104* 95  BUN 8 8 9 6   CREATININE 0.61 0.67 0.56* 0.44*  CALCIUM 9.4 9.6 9.4 9.0   GFR: Estimated Creatinine Clearance: 128.2 mL/min (A) (by C-G formula based on SCr of 0.44 mg/dL (L)). Liver Function Tests: No results for input(s): AST, ALT, ALKPHOS, BILITOT, PROT, ALBUMIN in the last 168 hours. No results for input(s): LIPASE, AMYLASE in the last 168 hours. No results for input(s): AMMONIA in the last 168 hours. Coagulation Profile: No results for input(s): INR, PROTIME in the last 168 hours. Cardiac Enzymes: No results for input(s): CKTOTAL, CKMB, CKMBINDEX, TROPONINI in the last  168 hours. BNP (last 3 results) No results for input(s): PROBNP in the last 8760 hours. HbA1C: No results for input(s): HGBA1C in the last 72 hours. CBG: No results for input(s): GLUCAP in the last 168 hours. Lipid Profile: No results for input(s): CHOL, HDL, LDLCALC, TRIG, CHOLHDL, LDLDIRECT in the last 72 hours. Thyroid Function Tests: No results for input(s): TSH, T4TOTAL, FREET4, T3FREE, THYROIDAB in the last 72 hours. Anemia Panel: No results for input(s): VITAMINB12, FOLATE, FERRITIN, TIBC, IRON, RETICCTPCT in the last 72 hours. Urine analysis:    Component Value Date/Time   COLORURINE YELLOW 06/06/2017 1333   APPEARANCEUR CLEAR 06/06/2017 1333   LABSPEC 1.010 06/06/2017 1333   PHURINE 6.0 06/06/2017 1333   GLUCOSEU NEGATIVE 06/06/2017 1333   HGBUR NEGATIVE 06/06/2017 1333   BILIRUBINUR NEGATIVE 06/06/2017 1333   KETONESUR NEGATIVE 06/06/2017 1333   PROTEINUR NEGATIVE 06/06/2017 1333   UROBILINOGEN 0.2 10/03/2012 0856   NITRITE NEGATIVE 06/06/2017 1333   LEUKOCYTESUR NEGATIVE 06/06/2017 1333   Sepsis Labs: @LABRCNTIP (procalcitonin:4,lacticidven:4) ) No results found for this or any previous visit (from the past 240 hour(s)).       Radiology Studies: No results found.    Scheduled Meds: . enoxaparin (LOVENOX) injection  40 mg Subcutaneous Q24H  . gabapentin  300 mg Oral TID  . iopamidol  20 mL Intra-articular Once  . ketorolac  30 mg Intravenous Q6H  . lidocaine (PF)  5 mL Intradermal Once  . nicotine  21 mg Transdermal Daily  . zinc sulfate  220 mg Oral Daily   Continuous Infusions: . sodium chloride    . sodium chloride 1 mL (06/22/17 1758)  .  ceFAZolin (ANCEF) IV Stopped (06/23/17 0221)  . potassium chloride       LOS: 18 days    Time spent in minutes: 35    Darlin Drop, MD [facility] Pager: [email or web address] Password [facility] 06/23/2017, 7:26 AM

## 2017-06-24 LAB — TROPONIN I
Troponin I: <0.03 ng/mL (ref <0.03)
Troponin I: <0.03 ng/mL (ref <0.03)

## 2017-06-24 NOTE — Progress Notes (Signed)
      [address]       [place] [id number]             [phone]      2 Days Post-Op Procedure(s) (LRB): Irrigation of chest wall wound with WOUND VAC CHANGE (N/A) Subjective: Feels poorly  Objective: Vital signs in last 24 hours: Temp:  [97.9 F (36.6 C)-98.9 F (37.2 C)] 97.9 F (36.6 C) (02/02 0755) Pulse Rate:  [80-97] 80 (02/02 0755) Resp:  [17-18] 18 (02/02 0755) BP: (100-115)/(62-79) 110/62 (02/02 0755) SpO2:  [96 %-100 %] 96 % (02/02 0755)  Hemodynamic parameters for last 24 hours:    Intake/Output from previous day: 02/01 0701 - 02/02 0700 In: 2636.3 [P.O.:1780; I.V.:356.3; IV Piggyback:500] Out: -  Intake/Output this shift: No intake/output data recorded.  General appearance: alert, cooperative and no distress Heart: regular rate and rhythm Lungs: clear to auscultation bilaterally Vac in place without erethema  Lab Results: Recent Labs    06/23/17 1135  WBC 15.1*  HGB 11.0*  HCT 32.8*  PLT 520*   BMET: No results for input(s): NA, K, CL, CO2, GLUCOSE, BUN, CREATININE, CALCIUM in the last 72 hours.  PT/INR: No results for input(s): LABPROT, INR in the last 72 hours. ABG    Component Value Date/Time   PHART 7.430 06/07/2017 0940   HCO3 27.5 06/07/2017 0940   TCO2 29 06/05/2017 1457   ACIDBASEDEF 4.0 (H) 03/13/2007 0120   O2SAT 90.2 06/07/2017 0940   CBG (last 3)  No results for input(s): GLUCAP in the last 72 hours.  Meds Scheduled Meds: . enoxaparin (LOVENOX) injection  40 mg Subcutaneous Q24H  . gabapentin  300 mg Oral TID  . iopamidol  20 mL Intra-articular Once  . lidocaine (PF)  5 mL Intradermal Once  . zinc sulfate  220 mg Oral Daily   Continuous Infusions: . sodium chloride    . sodium chloride 1 mL (06/22/17 1758)  .  ceFAZolin (ANCEF) IV Stopped (06/24/17 0300)  . potassium chloride     PRN Meds:.acetaminophen **OR** acetaminophen, alum & mag hydroxide-simeth, diphenhydrAMINE, ondansetron **OR**  ondansetron (ZOFRAN) IV, oxyCODONE, potassium chloride, senna-docusate, sodium chloride flush, zolpidem  Xrays No results found.  Assessment/Plan: S/P Procedure(s) (LRB): Irrigation of chest wall wound with WOUND VAC CHANGE (N/A)   1 stable on current rx 2 needs to be back on suboxone which he did well on before heroine relapse, pain control is an issue that cant be resolved without addiction management- will do the best we can   LOS: 19 days    Clifford Tucker 06/24/2017

## 2017-06-24 NOTE — Progress Notes (Signed)
[facility] RN explained to patient the risks of infection and loss of PICC Viability. Patient acknowledges risks and followed up by  stating "whatever man, I just feel like im in prison man, whatever"

## 2017-06-24 NOTE — Progress Notes (Signed)
PROGRESS NOTE    Clifford Tucker   [id number]  DOB: 07-05-1980  DOA: 06/05/2017 PCP: Patient, No Pcp Per   Brief Narrative:  Clifford Fettershristopher Booneis a 37 y.o.malewith medical history significant forIV drug abuse and recurrent skin and soft tissue infections. He presented with neck/hip pain found to have a neck abscess, septic arthritis of the hip and now MSSA bacteremia. CT surgery, orthopedic surgery and infectious disease consulted.  06/22/17: Irrigation of chest wall with wound vac change.  Subjective: 06/24/17: Patient seen.  Also discussed with the patient's nurse, Clifford JohnBrian.  Patient records reviewed.  Patient reports chest pain.  No fever or chills.  Surgery would like to keep in hospital for 1 more week with wound vac. Social worker is working on placement to SNF for discharge planning. IV drug user. Would not discharge to home with PICC line.   Assessment & Plan:   Bacteremia due to methicillin susceptible Staphylococcus aureus (MSSA) in the setting of IV drug use  -stable, no changes, afebrile, normotensive. -Blood cultures 2/2+ for MSSA, last heroin use a week ago -works as a Nutritional therapistplumber with dirty pipes -Patient was originally placed on vancomycin and Zosyn, transition to IV Ancef -Sternal aspirate showed MSSA, left hip wound cultures also MSSA -2D echo with no vegetation.  - ID following, recommended continue cefazolin for total 6 wks -   PICC placed- he will either go to SNF or complete treatment in the hospital- cannot go home with PICC - pt request dose of oral pain med increased and IV pain med stopped -will continue current pain management due to minimal pain reported by the patient and we will attempt to wean off  -with hx of drug addiction, when procedures completed we will consider suboxone treatment -pt states he has participated in suboxone treatment in the past and he welcomes the idea. -Complete course of antibiotics.  Left hip abscess/iliopsoas septic  arthritis, infectious myositis -Status post left hip I&D with cultures done on 1/15, showing staph aureus -Status post drain placement 1/18 for iliacus abscess which was removed on 06/14/17 -Doppler ultrasound of the left lower extremity negative for DVT -06/23/17: Left hip mildly swollen at site of sutures; no erythema or warmth noted -surgery following  Septic arthritis of the sternoclavicular joint, abscess  -June 15, 2017 :status post irrigation of the right sternoclavicular wound, with a wound VAC placement -Anticipating reevaluation of wound VAC and further irrigation and cleaning next week. Cardiothoracic surgery team recommending wound VAC change Mondays, Wednesdays, Fridays  Starting Monday, January 28, continue antibiotics -irrigation of chest wall wound with wound vac change 06/22/17.  Multifocal pneumonia -ID following, continue cefazolin -Possible septic emboli to the lungs.    IV drug user -Per patient, has been using heroin, [facility] but relapsed again in a month before the admission -Last heroin use a week ago PTA, so far not in any withdrawals -polysubstance abuse counseling -start suboxone when procedures are completed -Social work consulted for rehab/SNF placement  -06/23/17: Social worker optimistic about finding placement to SNF  Nicotine abuse -Nicotine patch -Tobacco cessation counseling   DVT prophylaxis: sq Lovenox Code Status: Full code Family Communication:  None at bedside Disposition Plan: to be determined Consultants:   CT surgery, ID Procedures:     Irrigation of chest wall with wound vac change 06/22/17. Antimicrobials:  Anti-infectives (From admission, onward)   Start     Dose/Rate Route Frequency Ordered Stop   06/15/17 0816  vancomycin (VANCOCIN) powder  Status:  Discontinued       As needed 06/15/17 0817 06/15/17 0854   06/07/17 1400  cefUROXime (ZINACEF) 1.5 g in dextrose 5 % 50 mL IVPB     1.5 g 100 mL/hr  over 30 Minutes Intravenous To ShortStay Surgical 06/07/17 0125 06/07/17 1527   06/07/17 0745  vancomycin (VANCOCIN) 1,000 mg in sodium chloride 0.9 % 1,000 mL irrigation      Irrigation To Surgery 06/07/17 0744 06/07/17 1513   06/06/17 1639  gentamicin (GARAMYCIN) injection  Status:  Discontinued       As needed 06/06/17 1640 06/06/17 1715   06/06/17 1638  vancomycin (VANCOCIN) powder  Status:  Discontinued       As needed 06/06/17 1639 06/06/17 1715   06/06/17 1000  ceFAZolin (ANCEF) IVPB 2g/100 mL premix     2 g 200 mL/hr over 30 Minutes Intravenous Every 8 hours 06/06/17 0911     06/06/17 0000  vancomycin (VANCOCIN) IVPB 1000 mg/200 mL premix  Status:  Discontinued     1,000 mg 200 mL/hr over 60 Minutes Intravenous Every 8 hours 06/05/17 2211 06/06/17 0911   06/05/17 2330  piperacillin-tazobactam (ZOSYN) IVPB 3.375 g  Status:  Discontinued     3.375 g 12.5 mL/hr over 240 Minutes Intravenous Every 8 hours 06/05/17 2211 06/06/17 0911   06/05/17 2200  cefTRIAXone (ROCEPHIN) 1 g in dextrose 5 % 50 mL IVPB  Status:  Discontinued     1 g 100 mL/hr over 30 Minutes Intravenous Every 24 hours 06/05/17 2140 06/05/17 2202   06/05/17 2200  azithromycin (ZITHROMAX) 500 mg in dextrose 5 % 250 mL IVPB  Status:  Discontinued     500 mg 250 mL/hr over 60 Minutes Intravenous Every 24 hours 06/05/17 2140 06/06/17 0911   06/05/17 1600  vancomycin (VANCOCIN) 1,500 mg in sodium chloride 0.9 % 500 mL IVPB     1,500 mg 250 mL/hr over 120 Minutes Intravenous  Once 06/05/17 1542 06/05/17 1855   06/05/17 1515  piperacillin-tazobactam (ZOSYN) IVPB 3.375 g     3.375 g 100 mL/hr over 30 Minutes Intravenous  Once 06/05/17 1512 06/05/17 1557       Objective: Vitals:   06/23/17 1415 06/23/17 2106 06/24/17 0437 06/24/17 0755  BP: 115/63 100/63 108/79 110/62  Pulse: 97 94 85 80  Resp:  17 18 18   Temp: 98.9 F (37.2 C) 98.3 F (36.8 C) 98 F (36.7 C) 97.9 F (36.6 C)  TempSrc: Oral Oral  Oral  SpO2: 100%  99% 97% 96%  Weight:      Height:        Intake/Output Summary (Last 24 hours) at 06/24/2017 1413 Last data filed at 06/24/2017 0438 Gross per 24 hour  Intake 1856.33 ml  Output -  Net 1856.33 ml   Filed Weights   06/05/17 1407 06/06/17 0844  Weight: 68 kg (150 lb) 70.3 kg (154 lb 15.7 oz)    Examination: 06/23/17 patient seen and examined General exam: Appears comfortable.  Awake and alert.   HEENT: PERRLA, oral mucosa moist, no sclera icterus or thrush Respiratory system: Clear to auscultation. Respiratory effort normal. Cardiovascular system: S1 & S2 heard, RRR.  No murmurs  Chest: wound vac in upper chest wall Gastrointestinal system: Abdomen soft, non-tender, nondistended. Normal bowel sound. No organomegaly Central nervous system: Alert and oriented. No focal neurological deficits. Extremities: No cyanosis, clubbing or edema Skin: left hip is mildly swollen below sutures with no erythema. Psychiatry:  Mood & affect appropriate.  Data Reviewed: I have personally reviewed following labs and imaging studies  CBC: Recent Labs  Lab 06/23/17 1135  WBC 15.1*  HGB 11.0*  HCT 32.8*  MCV 90.9  PLT 520*   Basic Metabolic Panel: Recent Labs  Lab 06/18/17 0843 06/19/17 0549 06/20/17 0358  NA 133* 134* 138  K 3.9 4.2 4.0  CL 94* 98* 101  CO2 26 25 26   GLUCOSE 143* 104* 95  BUN 8 9 6   CREATININE 0.67 0.56* 0.44*  CALCIUM 9.6 9.4 9.0   GFR: Estimated Creatinine Clearance: 128.2 mL/min (A) (by C-G formula based on SCr of 0.44 mg/dL (L)). Liver Function Tests: No results for input(s): AST, ALT, ALKPHOS, BILITOT, PROT, ALBUMIN in the last 168 hours. No results for input(s): LIPASE, AMYLASE in the last 168 hours. No results for input(s): AMMONIA in the last 168 hours. Coagulation Profile: No results for input(s): INR, PROTIME in the last 168 hours. Cardiac Enzymes: No results for input(s): CKTOTAL, CKMB, CKMBINDEX, TROPONINI in the last 168 hours. BNP (last 3  results) No results for input(s): PROBNP in the last 8760 hours. HbA1C: No results for input(s): HGBA1C in the last 72 hours. CBG: No results for input(s): GLUCAP in the last 168 hours. Lipid Profile: No results for input(s): CHOL, HDL, LDLCALC, TRIG, CHOLHDL, LDLDIRECT in the last 72 hours. Thyroid Function Tests: No results for input(s): TSH, T4TOTAL, FREET4, T3FREE, THYROIDAB in the last 72 hours. Anemia Panel: No results for input(s): VITAMINB12, FOLATE, FERRITIN, TIBC, IRON, RETICCTPCT in the last 72 hours. Urine analysis:    Component Value Date/Time   COLORURINE YELLOW 06/06/2017 1333   APPEARANCEUR CLEAR 06/06/2017 1333   LABSPEC 1.010 06/06/2017 1333   PHURINE 6.0 06/06/2017 1333   GLUCOSEU NEGATIVE 06/06/2017 1333   HGBUR NEGATIVE 06/06/2017 1333   BILIRUBINUR NEGATIVE 06/06/2017 1333   KETONESUR NEGATIVE 06/06/2017 1333   PROTEINUR NEGATIVE 06/06/2017 1333   UROBILINOGEN 0.2 10/03/2012 0856   NITRITE NEGATIVE 06/06/2017 1333   LEUKOCYTESUR NEGATIVE 06/06/2017 1333   Sepsis Labs: @LABRCNTIP (procalcitonin:4,lacticidven:4) ) No results found for this or any previous visit (from the past 240 hour(s)).       Radiology Studies: No results found.    Scheduled Meds: . enoxaparin (LOVENOX) injection  40 mg Subcutaneous Q24H  . gabapentin  300 mg Oral TID  . iopamidol  20 mL Intra-articular Once  . lidocaine (PF)  5 mL Intradermal Once  . zinc sulfate  220 mg Oral Daily   Continuous Infusions: . sodium chloride    . sodium chloride 1 mL (06/22/17 1758)  .  ceFAZolin (ANCEF) IV Stopped (06/24/17 1029)  . potassium chloride       LOS: 19 days    Time spent in minutes: 35    Barnetta Chapel, MD [facility] Pager: [phone] [email or web address] Password [facility] 06/24/2017, 2:13 PM

## 2017-06-25 LAB — RENAL FUNCTION PANEL
Albumin: 2.7 g/dL — ABNORMAL LOW (ref 3.5–5.0)
Albumin: 2.7 g/dL — ABNORMAL LOW (ref 3.5–5.0)
Anion gap: 10 (ref 5–15)
Anion gap: 10 (ref 5–15)
BUN: 6 mg/dL (ref 6–20)
BUN: <5 mg/dL — ABNORMAL LOW (ref 6–20)
CO2: 26 mmol/L (ref 22–32)
CO2: 25 mmol/L (ref 22–32)
Calcium: 8.7 mg/dL — ABNORMAL LOW (ref 8.9–10.3)
Calcium: 8.6 mg/dL — ABNORMAL LOW (ref 8.9–10.3)
Chloride: 103 mmol/L (ref 101–111)
Chloride: 103 mmol/L (ref 101–111)
Creatinine, Ser: 0.53 mg/dL — ABNORMAL LOW (ref 0.61–1.24)
Creatinine, Ser: 0.5 mg/dL — ABNORMAL LOW (ref 0.61–1.24)
GFR calc Af Amer: >60 mL/min (ref >60)
GFR calc Af Amer: >60 mL/min (ref >60)
GFR calc non Af Amer: >60 mL/min (ref >60)
GFR calc non Af Amer: >60 mL/min (ref >60)
Glucose, Bld: 125 mg/dL — ABNORMAL HIGH (ref 65–99)
Glucose, Bld: 91 mg/dL (ref 65–99)
Phosphorus: 4 mg/dL (ref 2.5–4.6)
Phosphorus: 4.4 mg/dL (ref 2.5–4.6)
Potassium: 3.2 mmol/L — ABNORMAL LOW (ref 3.5–5.1)
Potassium: 3.6 mmol/L (ref 3.5–5.1)
Sodium: 139 mmol/L (ref 135–145)
Sodium: 138 mmol/L (ref 135–145)

## 2017-06-25 LAB — CBC WITH DIFFERENTIAL/PLATELET
Basophils Absolute: 0 10*3/uL (ref 0.0–0.1)
Basophils Relative: 0 %
Eosinophils Absolute: 0.2 10*3/uL (ref 0.0–0.7)
Eosinophils Relative: 2 %
HCT: 29.6 % — ABNORMAL LOW (ref 39.0–52.0)
Hemoglobin: 9.7 g/dL — ABNORMAL LOW (ref 13.0–17.0)
Lymphocytes Relative: 31 %
Lymphs Abs: 2.5 10*3/uL (ref 0.7–4.0)
MCH: 30.4 pg (ref 26.0–34.0)
MCHC: 32.8 g/dL (ref 30.0–36.0)
MCV: 92.8 fL (ref 78.0–100.0)
Monocytes Absolute: 0.6 10*3/uL (ref 0.1–1.0)
Monocytes Relative: 7 %
Neutro Abs: 4.8 10*3/uL (ref 1.7–7.7)
Neutrophils Relative %: 60 %
Platelets: 402 10*3/uL — ABNORMAL HIGH (ref 150–400)
RBC: 3.19 MIL/uL — ABNORMAL LOW (ref 4.22–5.81)
RDW: 14.4 % (ref 11.5–15.5)
WBC: 8.1 10*3/uL (ref 4.0–10.5)

## 2017-06-25 LAB — TROPONIN I: Troponin I: <0.03 ng/mL (ref <0.03)

## 2017-06-25 NOTE — Progress Notes (Signed)
PROGRESS NOTE    Lance BoschChristopher Templer   WUJ:[id number]  DOB: 1980/10/31  DOA: 06/05/2017 PCP: Patient, No Pcp Per   Brief Narrative:  [name] a 37 y.o.malewith medical history significant forIV drug abuse and recurrent skin and soft tissue infections. He presented with neck/hip pain found to have a neck abscess, septic arthritis of the hip and now MSSA bacteremia. CT surgery, orthopedic surgery and infectious disease consulted.  06/22/17: Irrigation of chest wall with wound vac change.  Subjective: 06/25/17: Patient seen.  Patient reports generalized pain.  Troponin done yesterday was within normal range.  EKG was normal.  While it mentioned that the patient reported mainly chest pain yesterday.  No fever or chills.  Surgery would like to keep in hospital for 1 more week with wound vac. Social worker is working on placement to SNF for discharge planning. IV drug user. Would not discharge to home with PICC line.   Assessment & Plan:   Bacteremia due to methicillin susceptible Staphylococcus aureus (MSSA) in the setting of IV drug use  -stable, no changes, afebrile, normotensive. -Blood cultures 2/2+ for MSSA, last heroin use a week ago -works as a Nutritional therapistplumber with dirty pipes -Patient was originally placed on vancomycin and Zosyn, transition to IV Ancef -Sternal aspirate showed MSSA, left hip wound cultures also MSSA -2D echo with no vegetation.  - ID following, recommended continue cefazolin for total 6 wks -   PICC placed- he will either go to SNF or complete treatment in the hospital- cannot go home with PICC - pt request dose of oral pain med increased and IV pain med stopped -will continue current pain management due to minimal pain reported by the patient and we will attempt to wean off  -with hx of drug addiction, when procedures completed we will consider suboxone treatment -pt states he has participated in suboxone treatment in the past and he welcomes the  idea. -Complete course of antibiotics.  Left hip abscess/iliopsoas septic arthritis, infectious myositis -Status post left hip I&D with cultures done on 1/15, showing staph aureus -Status post drain placement 1/18 for iliacus abscess which was removed on 06/14/17 -Doppler ultrasound of the left lower extremity negative for DVT -06/23/17: Left hip mildly swollen at site of sutures; no erythema or warmth noted -surgery following  Septic arthritis of the sternoclavicular joint, abscess  -June 15, 2017 :status post irrigation of the right sternoclavicular wound, with a wound VAC placement -Anticipating reevaluation of wound VAC and further irrigation and cleaning next week. Cardiothoracic surgery team recommending wound VAC change Mondays, Wednesdays, Fridays  Starting Monday, January 28, continue antibiotics -irrigation of chest wall wound with wound vac change 06/22/17.  Multifocal pneumonia -ID following, continue cefazolin -Possible septic emboli to the lungs.    IV drug user -Per patient, has been using heroin, [facility] but relapsed again in a month before the admission -Last heroin use a week ago PTA, so far not in any withdrawals -polysubstance abuse counseling -start suboxone when procedures are completed -Social work consulted for rehab/SNF placement  -06/23/17: Social worker optimistic about finding placement to SNF  Nicotine abuse -Nicotine patch -Tobacco cessation counseling   DVT prophylaxis: sq Lovenox Code Status: Full code Family Communication:  None at bedside Disposition Plan: to be determined Consultants:   CT surgery, ID Procedures:     Irrigation of chest wall with wound vac change 06/22/17. Antimicrobials:  Anti-infectives (From admission, onward)   Start     Dose/Rate Route Frequency Ordered  Stop   06/15/17 0816  vancomycin (VANCOCIN) powder  Status:  Discontinued       As needed 06/15/17 0817 06/15/17 0854   06/07/17 1400   cefUROXime (ZINACEF) 1.5 g in dextrose 5 % 50 mL IVPB     1.5 g 100 mL/hr over 30 Minutes Intravenous To [facility]Stay Surgical 06/07/17 0125 06/07/17 1527   06/07/17 0745  vancomycin (VANCOCIN) 1,000 mg in sodium chloride 0.9 % 1,000 mL irrigation      Irrigation To Surgery 06/07/17 0744 06/07/17 1513   06/06/17 1639  gentamicin (GARAMYCIN) injection  Status:  Discontinued       As needed 06/06/17 1640 06/06/17 1715   06/06/17 1638  vancomycin (VANCOCIN) powder  Status:  Discontinued       As needed 06/06/17 1639 06/06/17 1715   06/06/17 1000  ceFAZolin (ANCEF) IVPB 2g/100 mL premix     2 g 200 mL/hr over 30 Minutes Intravenous Every 8 hours 06/06/17 0911     06/06/17 0000  vancomycin (VANCOCIN) IVPB 1000 mg/200 mL premix  Status:  Discontinued     1,000 mg 200 mL/hr over 60 Minutes Intravenous Every 8 hours 06/05/17 2211 06/06/17 0911   06/05/17 2330  piperacillin-tazobactam (ZOSYN) IVPB 3.375 g  Status:  Discontinued     3.375 g 12.5 mL/hr over 240 Minutes Intravenous Every 8 hours 06/05/17 2211 06/06/17 0911   06/05/17 2200  cefTRIAXone (ROCEPHIN) 1 g in dextrose 5 % 50 mL IVPB  Status:  Discontinued     1 g 100 mL/hr over 30 Minutes Intravenous Every 24 hours 06/05/17 2140 06/05/17 2202   06/05/17 2200  azithromycin (ZITHROMAX) 500 mg in dextrose 5 % 250 mL IVPB  Status:  Discontinued     500 mg 250 mL/hr over 60 Minutes Intravenous Every 24 hours 06/05/17 2140 06/06/17 0911   06/05/17 1600  vancomycin (VANCOCIN) 1,500 mg in sodium chloride 0.9 % 500 mL IVPB     1,500 mg 250 mL/hr over 120 Minutes Intravenous  Once 06/05/17 1542 06/05/17 1855   06/05/17 1515  piperacillin-tazobactam (ZOSYN) IVPB 3.375 g     3.375 g 100 mL/hr over 30 Minutes Intravenous  Once 06/05/17 1512 06/05/17 1557       Objective: Vitals:   06/24/17 1441 06/24/17 2135 06/25/17 0457 06/25/17 0827  BP: 104/72 106/67 109/61 105/62  Pulse: 80 93 91 83  Resp: 18 17 18 18   Temp: 98 F (36.7 C) 98 F (36.7 C)  98.3 F (36.8 C) 98.1 F (36.7 C)  TempSrc: Oral Oral Oral Oral  SpO2: 97% 96%  97%  Weight:      Height:        Intake/Output Summary (Last 24 hours) at 06/25/2017 1733 Last data filed at 06/25/2017 1400 Gross per 24 hour  Intake 403.67 ml  Output 0 ml  Net 403.67 ml   Filed Weights   06/05/17 1407 06/06/17 0844  Weight: 68 kg (150 lb) 70.3 kg (154 lb 15.7 oz)    Examination: 06/23/17 patient seen and examined General exam: Appears comfortable.  Awake and alert.   HEENT: PERRLA, oral mucosa moist, no sclera icterus or thrush Respiratory system: Clear to auscultation. Respiratory effort normal. Cardiovascular system: S1 & S2 heard, RRR.  No murmurs  Chest: wound vac in upper chest wall Gastrointestinal system: Abdomen soft, non-tender, nondistended. Normal bowel sound. No organomegaly Central nervous system: Alert and oriented. No focal neurological deficits. Extremities: No cyanosis, clubbing or edema Skin: left hip is mildly swollen below  sutures with no erythema. Psychiatry:  Mood & affect appropriate.     Data Reviewed: I have personally reviewed following labs and imaging studies  CBC: Recent Labs  Lab 06/23/17 1135 06/25/17 0159  WBC 15.1* 8.1  NEUTROABS  --  4.8  HGB 11.0* 9.7*  HCT 32.8* 29.6*  MCV 90.9 92.8  PLT 520* 402*   Basic Metabolic Panel: Recent Labs  Lab 06/19/17 0549 06/20/17 0358 06/25/17 0159 06/25/17 1255  NA 134* 138 139 138  K 4.2 4.0 3.2* 3.6  CL 98* 101 103 103  CO2 25 26 26 25   GLUCOSE 104* 95 125* 91  BUN 9 6 6  <5*  CREATININE 0.56* 0.44* 0.53* 0.50*  CALCIUM 9.4 9.0 8.7* 8.6*  PHOS  --   --  4.0 4.4   GFR: Estimated Creatinine Clearance: 128.2 mL/min (A) (by C-G formula based on SCr of 0.5 mg/dL (L)). Liver Function Tests: Recent Labs  Lab 06/25/17 0159 06/25/17 1255  ALBUMIN 2.7* 2.7*   No results for input(s): LIPASE, AMYLASE in the last 168 hours. No results for input(s): AMMONIA in the last 168 hours. Coagulation  Profile: No results for input(s): INR, PROTIME in the last 168 hours. Cardiac Enzymes: Recent Labs  Lab 06/24/17 1933 06/24/17 2253 06/25/17 0159  TROPONINI <0.03 <0.03 <0.03   BNP (last 3 results) No results for input(s): PROBNP in the last 8760 hours. HbA1C: No results for input(s): HGBA1C in the last 72 hours. CBG: No results for input(s): GLUCAP in the last 168 hours. Lipid Profile: No results for input(s): CHOL, HDL, LDLCALC, TRIG, CHOLHDL, LDLDIRECT in the last 72 hours. Thyroid Function Tests: No results for input(s): TSH, T4TOTAL, FREET4, T3FREE, THYROIDAB in the last 72 hours. Anemia Panel: No results for input(s): VITAMINB12, FOLATE, FERRITIN, TIBC, IRON, RETICCTPCT in the last 72 hours. Urine analysis:    Component Value Date/Time   COLORURINE YELLOW 06/06/2017 1333   APPEARANCEUR CLEAR 06/06/2017 1333   LABSPEC 1.010 06/06/2017 1333   PHURINE 6.0 06/06/2017 1333   GLUCOSEU NEGATIVE 06/06/2017 1333   HGBUR NEGATIVE 06/06/2017 1333   BILIRUBINUR NEGATIVE 06/06/2017 1333   KETONESUR NEGATIVE 06/06/2017 1333   PROTEINUR NEGATIVE 06/06/2017 1333   UROBILINOGEN 0.2 10/03/2012 0856   NITRITE NEGATIVE 06/06/2017 1333   LEUKOCYTESUR NEGATIVE 06/06/2017 1333   Sepsis Labs: @LABRCNTIP (procalcitonin:4,lacticidven:4) ) No results found for this or any previous visit (from the past 240 hour(s)).       Radiology Studies: No results found.    Scheduled Meds: . enoxaparin (LOVENOX) injection  40 mg Subcutaneous Q24H  . gabapentin  300 mg Oral TID  . iopamidol  20 mL Intra-articular Once  . lidocaine (PF)  5 mL Intradermal Once  . zinc sulfate  220 mg Oral Daily   Continuous Infusions: . sodium chloride    . sodium chloride 1 mL (06/22/17 1758)  .  ceFAZolin (ANCEF) IV 2 g (06/25/17 1717)  . potassium chloride       LOS: 20 days    Time spent in minutes: 35    Barnetta Chapel, MD [facility] Pager: [phone] [email or web address] Password  [facility] 06/25/2017, 5:33 PM

## 2017-06-25 NOTE — Progress Notes (Signed)
      [address]       [place] [id number]             [phone]      3 Days Post-Op Procedure(s) (LRB): Irrigation of chest wall wound with WOUND VAC CHANGE (N/A) Subjective: uncomfortable  Objective: Vital signs in last 24 hours: Temp:  [98 F (36.7 C)-98.3 F (36.8 C)] 98.1 F (36.7 C) (02/03 0827) Pulse Rate:  [80-93] 83 (02/03 0827) Resp:  [17-18] 18 (02/03 0827) BP: (104-109)/(61-72) 105/62 (02/03 0827) SpO2:  [96 %-97 %] 97 % (02/03 0827)  Hemodynamic parameters for last 24 hours:    Intake/Output from previous day: 02/02 0701 - 02/03 0700 In: 403.7 [P.O.:250; I.V.:153.7] Out: -  Intake/Output this shift: No intake/output data recorded.  General appearance: alert, cooperative and no distress Heart: regular rate and rhythm Lungs: clear to auscultation bilaterally Wound: vac in place- serosang drainage, no cellulitis  Lab Results: Recent Labs    06/23/17 1135 06/25/17 0159  WBC 15.1* 8.1  HGB 11.0* 9.7*  HCT 32.8* 29.6*  PLT 520* 402*   BMET:  Recent Labs    06/25/17 0159  NA 139  K 3.2*  CL 103  CO2 26  GLUCOSE 125*  BUN 6  CREATININE 0.53*  CALCIUM 8.7*    PT/INR: No results for input(s): LABPROT, INR in the last 72 hours. ABG    Component Value Date/Time   PHART 7.430 06/07/2017 0940   HCO3 27.5 06/07/2017 0940   TCO2 29 06/05/2017 1457   ACIDBASEDEF 4.0 (H) 03/13/2007 0120   O2SAT 90.2 06/07/2017 0940   CBG (last 3)  No results for input(s): GLUCAP in the last 72 hours.  Meds Scheduled Meds: . enoxaparin (LOVENOX) injection  40 mg Subcutaneous Q24H  . gabapentin  300 mg Oral TID  . iopamidol  20 mL Intra-articular Once  . lidocaine (PF)  5 mL Intradermal Once  . zinc sulfate  220 mg Oral Daily   Continuous Infusions: . sodium chloride    . sodium chloride 1 mL (06/22/17 1758)  .  ceFAZolin (ANCEF) IV Stopped (06/25/17 1014)  . potassium chloride     PRN Meds:.acetaminophen **OR** acetaminophen, alum  & mag hydroxide-simeth, diphenhydrAMINE, ondansetron **OR** ondansetron (ZOFRAN) IV, oxyCODONE, potassium chloride, senna-docusate, sodium chloride flush, zolpidem  Xrays No results found.  Assessment/Plan: S/P Procedure(s) (LRB): Irrigation of chest wall wound with WOUND VAC CHANGE (N/A)  1 stable on current mamagement     LOS: 20 days    [name] 06/25/2017

## 2017-06-26 ENCOUNTER — Telehealth (INDEPENDENT_AMBULATORY_CARE_PROVIDER_SITE_OTHER): Payer: Self-pay

## 2017-06-26 LAB — CREATININE, SERUM
Creatinine, Ser: 0.57 mg/dL — ABNORMAL LOW (ref 0.61–1.24)
GFR calc Af Amer: >60 mL/min (ref >60)
GFR calc non Af Amer: >60 mL/min (ref >60)

## 2017-06-26 NOTE — Progress Notes (Signed)
Patient having pain at incision left hip and difficulty weightbearing I will evaluate in a.m. between cases

## 2017-06-26 NOTE — Telephone Encounter (Signed)
Please advise. Thanks.  

## 2017-06-26 NOTE — Progress Notes (Signed)
      [address]       [place] [id number]             [phone]        4 Days Post-Op Procedure(s) (LRB): Irrigation of chest wall wound with WOUND VAC CHANGE (N/A)  Subjective: Patient with incisional pain as VAC just changed.  Objective: Vital signs in last 24 hours: Temp:  [98.2 F (36.8 C)-98.5 F (36.9 C)] 98.5 F (36.9 C) (02/04 0451) Pulse Rate:  [85-96] 85 (02/04 0451) Resp:  [17-18] 18 (02/04 0451) BP: (111-114)/(63-73) 114/73 (02/04 0451) SpO2:  [96 %-99 %] 99 % (02/04 0451)   Current Weight  06/06/17 154 lb 15.7 oz (70.3 kg)      Intake/Output from previous day: 02/03 0701 - 02/04 0700 In: 300 [P.O.:300] Out: 0    Physical Exam:  Cardiovascular: RRR Pulmonary: Clear to auscultation bilaterally Wounds: VAC in place  Lab Results: CBC: Recent Labs    06/23/17 1135 06/25/17 0159  WBC 15.1* 8.1  HGB 11.0* 9.7*  HCT 32.8* 29.6*  PLT 520* 402*   BMET:  Recent Labs    06/25/17 0159 06/25/17 1255 06/26/17 0358  NA 139 138  --   K 3.2* 3.6  --   CL 103 103  --   CO2 26 25  --   GLUCOSE 125* 91  --   BUN 6 <5*  --   CREATININE 0.53* 0.50* 0.57*  CALCIUM 8.7* 8.6*  --     PT/INR:  Lab Results  Component Value Date   INR 1.11 06/07/2017   ABG:  INR: Will add last result for INR, ABG once components are confirmed Will add last 4 CBG results once components are confirmed  Assessment/Plan:  1. CV - SR 2.  ID-On Cafazolin for MSSA Staph Aureus bacteremia and right sternoclavicular joint.  3. Continue present management   Divine Imber M ZimmermanPA-C 06/26/2017,11:34 AM

## 2017-06-26 NOTE — Consult Note (Addendum)
WOC Nurse wound consult note Reason for Consult: Requested to change Vac dressing; pt went to the OR for A-cell placement last week and sorbact dressing has been applied over this which remained in place and unable to visualize wound bed.  Removed black sponge and applied new one.   Wound type: Full thickness post-op dressing. Pt was medicated prior to the procedure and tolerated with minimal amt discomfort.  Setting is requested to be 100mm cont suction but machine is reading 16.7 in another pressure measurement system and cannot be changed. Plan to change dressing on Wed. Cammie Mcgeeawn Heyli Min MSN, RN, CWOCN, LorimorWCN-AP, CNS [phone]

## 2017-06-26 NOTE — Progress Notes (Signed)
PROGRESS NOTE    Lance BoschChristopher Mannings   XBJ:[id number]  DOB: 1981/04/30  DOA: 06/05/2017 PCP: Patient, No Pcp Per   Brief Narrative:  Clifford Fettershristopher Booneis a 37 y.o.malewith medical history significant forIV drug abuse and recurrent skin and soft tissue infections. He presented with neck/hip pain found to have a neck abscess, septic arthritis of the hip and now MSSA bacteremia. CT surgery, orthopedic surgery and infectious disease consulted.  06/22/17: Irrigation of chest wall with wound vac change.Social worker is working on placement to SNF for discharge planning. IV drug user. Would not discharge to home with PICC line.  Subjective: 06/26/17: Patient seen and examined at his beside. Reports pain in his left hip at site of sutures. Reports pain has been progressively getting worse to the point where it is uncomfortable putting weight on it. Has no other complaints. Wound care nurse in to change chest wall wound vac. Dr Diamantina Providenceean's office called to inform of left hip tenderness and swelling. To note patient had I&D done 06/07/17. Stitches still present.   Assessment & Plan:   Bacteremia due to methicillin susceptible Staphylococcus aureus (MSSA) in the setting of IV drug use  -stable, no changes, afebrile, normotensive. -Blood cultures 2/2+ for MSSA, last heroin use a week ago -works as a Nutritional therapistplumber with dirty pipes -Patient was originally placed on vancomycin and Zosyn, transition to IV Ancef -Sternal aspirate showed MSSA, left hip wound cultures also MSSA -2D echo with no vegetation.  - ID following, recommended continue cefazolin for total 6 wks -   PICC placed- he will either go to SNF or complete treatment in the hospital- cannot go home with PICC - pt request dose of oral pain med increased and IV pain med stopped -will continue current pain management due to minimal pain reported by the patient and we will attempt to wean off  -with hx of drug addiction, when procedures completed we will  consider suboxone treatment -pt states he has participated in suboxone treatment in the past and he welcomes the idea. -Complete course of antibiotics.  Left hip abscess/iliopsoas septic arthritis, infectious myositis s/p I&D 06/06/17 -Status post left hip I&D with cultures done on 1/15, showing staph aureus -Status post drain placement 1/18 for iliacus abscess which was removed on 06/14/17 -Doppler ultrasound of the left lower extremity negative for DVT -06/23/17: Left hip mildly swollen at site of sutures; no erythema or warmth noted -06/26/17: persistent pain and tenderness at site of sutures left hip. Dr August Saucerean contacted 06/26/17.  Septic arthritis of the sternoclavicular joint, abscess  -June 15, 2017 :status post irrigation of the right sternoclavicular wound, with a wound VAC placement -06/26/17 Wound care specialist in the room to change wound VAC. -Cardiothoracic surgery team recommending wound VAC change Mondays, Wednesdays, Fridays  Starting Monday, January 28, continue antibiotics -irrigation of chest wall wound with wound vac change 06/22/17, 06/26/17. -CTS following  Multifocal pneumonia -ID following, continue cefazolin -Possible septic emboli to the lungs. -continue close follow up -O2 supplement as needed to maintain O2 sat 92% or greater    IV drug user -Per patient, has been using heroin, [facility] but relapsed again in a month before the admission -Last heroin use a week ago prior to admission, so far not in any withdrawals -polysubstance abuse counseling -start suboxone when procedures are completed -Social work consulted for rehab/SNF placement  -06/23/17: Social worker optimistic about finding placement to SNF  Nicotine abuse -Nicotine patch -Tobacco cessation counseling   DVT prophylaxis:  sq Lovenox Code Status: Full code Family Communication:  None at bedside Disposition Plan: possible SNF if placement Consultants:   CT surgery, ID,  ortho  Procedures:     Irrigation of chest wall with wound vac change 06/22/17.    Left hip I&D 06/06/17 Antimicrobials:  Anti-infectives (From admission, onward)   Start     Dose/Rate Route Frequency Ordered Stop   06/15/17 0816  vancomycin (VANCOCIN) powder  Status:  Discontinued       As needed 06/15/17 0817 06/15/17 0854   06/07/17 1400  cefUROXime (ZINACEF) 1.5 g in dextrose 5 % 50 mL IVPB     1.5 g 100 mL/hr over 30 Minutes Intravenous To ShortStay Surgical 06/07/17 0125 06/07/17 1527   06/07/17 0745  vancomycin (VANCOCIN) 1,000 mg in sodium chloride 0.9 % 1,000 mL irrigation      Irrigation To Surgery 06/07/17 0744 06/07/17 1513   06/06/17 1639  gentamicin (GARAMYCIN) injection  Status:  Discontinued       As needed 06/06/17 1640 06/06/17 1715   06/06/17 1638  vancomycin (VANCOCIN) powder  Status:  Discontinued       As needed 06/06/17 1639 06/06/17 1715   06/06/17 1000  ceFAZolin (ANCEF) IVPB 2g/100 mL premix     2 g 200 mL/hr over 30 Minutes Intravenous Every 8 hours 06/06/17 0911     06/06/17 0000  vancomycin (VANCOCIN) IVPB 1000 mg/200 mL premix  Status:  Discontinued     1,000 mg 200 mL/hr over 60 Minutes Intravenous Every 8 hours 06/05/17 2211 06/06/17 0911   06/05/17 2330  piperacillin-tazobactam (ZOSYN) IVPB 3.375 g  Status:  Discontinued     3.375 g 12.5 mL/hr over 240 Minutes Intravenous Every 8 hours 06/05/17 2211 06/06/17 0911   06/05/17 2200  cefTRIAXone (ROCEPHIN) 1 g in dextrose 5 % 50 mL IVPB  Status:  Discontinued     1 g 100 mL/hr over 30 Minutes Intravenous Every 24 hours 06/05/17 2140 06/05/17 2202   06/05/17 2200  azithromycin (ZITHROMAX) 500 mg in dextrose 5 % 250 mL IVPB  Status:  Discontinued     500 mg 250 mL/hr over 60 Minutes Intravenous Every 24 hours 06/05/17 2140 06/06/17 0911   06/05/17 1600  vancomycin (VANCOCIN) 1,500 mg in sodium chloride 0.9 % 500 mL IVPB     1,500 mg 250 mL/hr over 120 Minutes Intravenous  Once 06/05/17 1542 06/05/17 1855     06/05/17 1515  piperacillin-tazobactam (ZOSYN) IVPB 3.375 g     3.375 g 100 mL/hr over 30 Minutes Intravenous  Once 06/05/17 1512 06/05/17 1557       Objective: Vitals:   06/25/17 0457 06/25/17 0827 06/25/17 2029 06/26/17 0451  BP: 109/61 105/62 111/63 114/73  Pulse: 91 83 96 85  Resp: 18 18 17 18   Temp: 98.3 F (36.8 C) 98.1 F (36.7 C) 98.2 F (36.8 C) 98.5 F (36.9 C)  TempSrc: Oral Oral Oral Oral  SpO2:  97% 96% 99%  Weight:      Height:        Intake/Output Summary (Last 24 hours) at 06/26/2017 0919 Last data filed at 06/26/2017 1610 Gross per 24 hour  Intake 660 ml  Output 0 ml  Net 660 ml   Filed Weights   06/05/17 1407 06/06/17 0844  Weight: 68 kg (150 lb) 70.3 kg (154 lb 15.7 oz)    Examination: 06/26/17 patient seen and examined General exam: Appears uncomfortable due to left hip pain. HEENT: PERRLA, oral mucosa moist, no  sclera icterus or thrush Respiratory system: Clear to auscultation. Respiratory effort normal. Cardiovascular system: S1 & S2 heard, RRR.  No murmurs  Chest: wound vac in upper chest wall Gastrointestinal system: Abdomen soft, non-tender, nondistended. Normal bowel sound. No organomegaly Central nervous system: Alert and oriented. No focal neurological deficits. Extremities: No cyanosis, clubbing or edema Skin: left hip is mildly swollen at site of sutures with no erythema. Psychiatry:  Mood is appropriate for condition and setting    Data Reviewed: I have personally reviewed following labs and imaging studies  CBC: Recent Labs  Lab 06/23/17 1135 06/25/17 0159  WBC 15.1* 8.1  NEUTROABS  --  4.8  HGB 11.0* 9.7*  HCT 32.8* 29.6*  MCV 90.9 92.8  PLT 520* 402*   Basic Metabolic Panel: Recent Labs  Lab 06/20/17 0358 06/25/17 0159 06/25/17 1255 06/26/17 0358  NA 138 139 138  --   K 4.0 3.2* 3.6  --   CL 101 103 103  --   CO2 26 26 25   --   GLUCOSE 95 125* 91  --   BUN 6 6 <5*  --   CREATININE 0.44* 0.53* 0.50* 0.57*   CALCIUM 9.0 8.7* 8.6*  --   PHOS  --  4.0 4.4  --    GFR: Estimated Creatinine Clearance: 128.2 mL/min (A) (by C-G formula based on SCr of 0.57 mg/dL (L)). Liver Function Tests: Recent Labs  Lab 06/25/17 0159 06/25/17 1255  ALBUMIN 2.7* 2.7*   No results for input(s): LIPASE, AMYLASE in the last 168 hours. No results for input(s): AMMONIA in the last 168 hours. Coagulation Profile: No results for input(s): INR, PROTIME in the last 168 hours. Cardiac Enzymes: Recent Labs  Lab 06/24/17 1933 06/24/17 2253 06/25/17 0159  TROPONINI <0.03 <0.03 <0.03   BNP (last 3 results) No results for input(s): PROBNP in the last 8760 hours. HbA1C: No results for input(s): HGBA1C in the last 72 hours. CBG: No results for input(s): GLUCAP in the last 168 hours. Lipid Profile: No results for input(s): CHOL, HDL, LDLCALC, TRIG, CHOLHDL, LDLDIRECT in the last 72 hours. Thyroid Function Tests: No results for input(s): TSH, T4TOTAL, FREET4, T3FREE, THYROIDAB in the last 72 hours. Anemia Panel: No results for input(s): VITAMINB12, FOLATE, FERRITIN, TIBC, IRON, RETICCTPCT in the last 72 hours. Urine analysis:    Component Value Date/Time   COLORURINE YELLOW 06/06/2017 1333   APPEARANCEUR CLEAR 06/06/2017 1333   LABSPEC 1.010 06/06/2017 1333   PHURINE 6.0 06/06/2017 1333   GLUCOSEU NEGATIVE 06/06/2017 1333   HGBUR NEGATIVE 06/06/2017 1333   BILIRUBINUR NEGATIVE 06/06/2017 1333   KETONESUR NEGATIVE 06/06/2017 1333   PROTEINUR NEGATIVE 06/06/2017 1333   UROBILINOGEN 0.2 10/03/2012 0856   NITRITE NEGATIVE 06/06/2017 1333   LEUKOCYTESUR NEGATIVE 06/06/2017 1333   Sepsis Labs: @LABRCNTIP (procalcitonin:4,lacticidven:4) ) No results found for this or any previous visit (from the past 240 hour(s)).       Radiology Studies: No results found.    Scheduled Meds: . enoxaparin (LOVENOX) injection  40 mg Subcutaneous Q24H  . gabapentin  300 mg Oral TID  . iopamidol  20 mL Intra-articular  Once  . lidocaine (PF)  5 mL Intradermal Once  . zinc sulfate  220 mg Oral Daily   Continuous Infusions: . sodium chloride    . sodium chloride 1 mL (06/22/17 1758)  .  ceFAZolin (ANCEF) IV Stopped (06/26/17 0215)  . potassium chloride       LOS: 21 days    Time spent in  minutes: 35    Darlin Drop, MD [facility] Pager: [phone] [email or web address] Password [facility] 06/26/2017, 9:19 AM

## 2017-06-26 NOTE — Telephone Encounter (Signed)
Dr. Margo AyeHall left voicemail on triage line stating this pt is in a lot of pain, unable to bear weight, redness around I&D site, please call to discuss

## 2017-06-27 DIAGNOSIS — L02213 Cutaneous abscess of chest wall: Secondary | ICD-10-CM

## 2017-06-27 DIAGNOSIS — J869 Pyothorax without fistula: Secondary | ICD-10-CM

## 2017-06-27 NOTE — Progress Notes (Signed)
PROGRESS NOTE    Clifford Tucker   ZOX:[id number]  DOB: 07-22-1980  DOA: 06/05/2017 PCP: Patient, No Pcp Per   Brief Narrative:  Clifford Tucker a 37 y.o.malewith medical history significant forIV drug abuse and recurrent skin and soft tissue infections. He presented with neck/hip pain found to have a neck abscess, septic arthritis of the hip and now MSSA bacteremia. CT surgery, orthopedic surgery and infectious disease consulted.  06/22/17: Irrigation of chest wall with wound vac change.Social worker is working on placement to SNF for discharge planning. IV drug user. Would not discharge to home with PICC line.  Subjective: 06/27/17: Patient seen.  Patient continues to report significant left hip pain.    Assessment & Plan:   Bacteremia due to methicillin susceptible Staphylococcus aureus (MSSA) in the setting of IV drug use  -stable, no changes, afebrile, normotensive. -Blood cultures 2/2+ for MSSA, last heroin use a week ago -works as a Nutritional therapist with dirty pipes -Patient was originally placed on vancomycin and Zosyn, transition to IV Ancef -Sternal aspirate showed MSSA, left hip wound cultures also MSSA -2D echo with no vegetation.  - ID following, recommended continue cefazolin for total 6 wks -   PICC placed- he will either go to SNF or complete treatment in the hospital- cannot go home with PICC - pt request dose of oral pain med increased and IV pain med stopped -will continue current pain management due to minimal pain reported by the patient and we will attempt to wean off  -with hx of drug addiction, when procedures completed we will consider suboxone treatment -pt states he has participated in suboxone treatment in the past and he welcomes the idea. -Complete course of antibiotics.  Left hip abscess/iliopsoas septic arthritis, infectious myositis s/p I&D 06/06/17 -Status post left hip I&D with cultures done on 1/15, showing staph aureus -Status post drain placement  1/18 for iliacus abscess which was removed on 06/14/17 -Doppler ultrasound of the left lower extremity negative for DVT -06/23/17: Left hip mildly swollen at site of sutures; no erythema or warmth noted -06/26/17: persistent pain and tenderness at site of sutures left hip. Dr August Saucer contacted 06/26/17. -Repeat MRI of the left hip.  Kindly follow the result.  Septic arthritis of the sternoclavicular joint, abscess  -June 15, 2017 :status post irrigation of the right sternoclavicular wound, with a wound VAC placement -06/26/17 Wound care specialist in the room to change wound VAC. -Cardiothoracic surgery team recommending wound VAC change Mondays, Wednesdays, Fridays  Starting Monday, January 28, continue antibiotics -irrigation of chest wall wound with wound vac change 06/22/17, 06/26/17. -CTS following  Multifocal pneumonia -ID following, continue cefazolin -Possible septic emboli to the lungs. -continue close follow up -O2 supplement as needed to maintain O2 sat 92% or greater    IV drug user -Per patient, has been using heroin, [facility] but relapsed again in a month before the admission -Last heroin use a week ago prior to admission, so far not in any withdrawals -polysubstance abuse counseling -start suboxone when procedures are completed -Social work consulted for rehab/SNF placement  -06/23/17: Social worker optimistic about finding placement to SNF  Nicotine abuse -Nicotine patch -Tobacco cessation counseling   DVT prophylaxis: sq Lovenox Code Status: Full code Family Communication:  None at bedside Disposition Plan: possible SNF if placement Consultants:   CT surgery, ID, ortho  Procedures:     Irrigation of chest wall with wound vac change 06/22/17.    Left hip I&D 06/06/17 Antimicrobials:  Anti-infectives (From admission, onward)   Start     Dose/Rate Route Frequency Ordered Stop   06/15/17 0816  vancomycin (VANCOCIN) powder  Status:  Discontinued        As needed 06/15/17 0817 06/15/17 0854   06/07/17 1400  cefUROXime (ZINACEF) 1.5 g in dextrose 5 % 50 mL IVPB     1.5 g 100 mL/hr over 30 Minutes Intravenous To ShortStay Surgical 06/07/17 0125 06/07/17 1527   06/07/17 0745  vancomycin (VANCOCIN) 1,000 mg in sodium chloride 0.9 % 1,000 mL irrigation      Irrigation To Surgery 06/07/17 0744 06/07/17 1513   06/06/17 1639  gentamicin (GARAMYCIN) injection  Status:  Discontinued       As needed 06/06/17 1640 06/06/17 1715   06/06/17 1638  vancomycin (VANCOCIN) powder  Status:  Discontinued       As needed 06/06/17 1639 06/06/17 1715   06/06/17 1000  ceFAZolin (ANCEF) IVPB 2g/100 mL premix     2 g 200 mL/hr over 30 Minutes Intravenous Every 8 hours 06/06/17 0911     06/06/17 0000  vancomycin (VANCOCIN) IVPB 1000 mg/200 mL premix  Status:  Discontinued     1,000 mg 200 mL/hr over 60 Minutes Intravenous Every 8 hours 06/05/17 2211 06/06/17 0911   06/05/17 2330  piperacillin-tazobactam (ZOSYN) IVPB 3.375 g  Status:  Discontinued     3.375 g 12.5 mL/hr over 240 Minutes Intravenous Every 8 hours 06/05/17 2211 06/06/17 0911   06/05/17 2200  cefTRIAXone (ROCEPHIN) 1 g in dextrose 5 % 50 mL IVPB  Status:  Discontinued     1 g 100 mL/hr over 30 Minutes Intravenous Every 24 hours 06/05/17 2140 06/05/17 2202   06/05/17 2200  azithromycin (ZITHROMAX) 500 mg in dextrose 5 % 250 mL IVPB  Status:  Discontinued     500 mg 250 mL/hr over 60 Minutes Intravenous Every 24 hours 06/05/17 2140 06/06/17 0911   06/05/17 1600  vancomycin (VANCOCIN) 1,500 mg in sodium chloride 0.9 % 500 mL IVPB     1,500 mg 250 mL/hr over 120 Minutes Intravenous  Once 06/05/17 1542 06/05/17 1855   06/05/17 1515  piperacillin-tazobactam (ZOSYN) IVPB 3.375 g     3.375 g 100 mL/hr over 30 Minutes Intravenous  Once 06/05/17 1512 06/05/17 1557       Objective: Vitals:   06/26/17 1440 06/26/17 2122 06/27/17 0547 06/27/17 1436  BP: 115/75 (!) 98/58 107/66 (!) 97/57  Pulse: 85  (!) 110 (!) 104 78  Resp: 18 17 19 18   Temp: 98 F (36.7 C) (!) 100.6 F (38.1 C) 98.4 F (36.9 C) 98.5 F (36.9 C)  TempSrc: Oral Oral  Oral  SpO2: 99% 97% 97% 95%  Weight:      Height:        Intake/Output Summary (Last 24 hours) at 06/27/2017 1732 Last data filed at 06/27/2017 1522 Gross per 24 hour  Intake 610 ml  Output -  Net 610 ml   Filed Weights   06/05/17 1407 06/06/17 0844  Weight: 68 kg (150 lb) 70.3 kg (154 lb 15.7 oz)    Examination: 06/26/17 patient seen and examined General exam: Appears uncomfortable due to left hip pain. HEENT: PERRLA, oral mucosa moist, no sclera icterus or thrush Respiratory system: Clear to auscultation. Respiratory effort normal. Cardiovascular system: S1 & S2 heard, RRR.  No murmurs  Chest: wound vac in upper chest wall Gastrointestinal system: Abdomen soft, non-tender, nondistended. Normal bowel sound. No organomegaly Central nervous system: Alert and  oriented. No focal neurological deficits. Extremities: No cyanosis, clubbing or edema    Data Reviewed: I have personally reviewed following labs and imaging studies  CBC: Recent Labs  Lab 06/23/17 1135 06/25/17 0159  WBC 15.1* 8.1  NEUTROABS  --  4.8  HGB 11.0* 9.7*  HCT 32.8* 29.6*  MCV 90.9 92.8  PLT 520* 402*   Basic Metabolic Panel: Recent Labs  Lab 06/25/17 0159 06/25/17 1255 06/26/17 0358  NA 139 138  --   K 3.2* 3.6  --   CL 103 103  --   CO2 26 25  --   GLUCOSE 125* 91  --   BUN 6 <5*  --   CREATININE 0.53* 0.50* 0.57*  CALCIUM 8.7* 8.6*  --   PHOS 4.0 4.4  --    GFR: Estimated Creatinine Clearance: 128.2 mL/min (A) (by C-G formula based on SCr of 0.57 mg/dL (L)). Liver Function Tests: Recent Labs  Lab 06/25/17 0159 06/25/17 1255  ALBUMIN 2.7* 2.7*   No results for input(s): LIPASE, AMYLASE in the last 168 hours. No results for input(s): AMMONIA in the last 168 hours. Coagulation Profile: No results for input(s): INR, PROTIME in the last 168  hours. Cardiac Enzymes: Recent Labs  Lab 06/24/17 1933 06/24/17 2253 06/25/17 0159  TROPONINI <0.03 <0.03 <0.03   BNP (last 3 results) No results for input(s): PROBNP in the last 8760 hours. HbA1C: No results for input(s): HGBA1C in the last 72 hours. CBG: No results for input(s): GLUCAP in the last 168 hours. Lipid Profile: No results for input(s): CHOL, HDL, LDLCALC, TRIG, CHOLHDL, LDLDIRECT in the last 72 hours. Thyroid Function Tests: No results for input(s): TSH, T4TOTAL, FREET4, T3FREE, THYROIDAB in the last 72 hours. Anemia Panel: No results for input(s): VITAMINB12, FOLATE, FERRITIN, TIBC, IRON, RETICCTPCT in the last 72 hours. Urine analysis:    Component Value Date/Time   COLORURINE YELLOW 06/06/2017 1333   APPEARANCEUR CLEAR 06/06/2017 1333   LABSPEC 1.010 06/06/2017 1333   PHURINE 6.0 06/06/2017 1333   GLUCOSEU NEGATIVE 06/06/2017 1333   HGBUR NEGATIVE 06/06/2017 1333   BILIRUBINUR NEGATIVE 06/06/2017 1333   KETONESUR NEGATIVE 06/06/2017 1333   PROTEINUR NEGATIVE 06/06/2017 1333   UROBILINOGEN 0.2 10/03/2012 0856   NITRITE NEGATIVE 06/06/2017 1333   LEUKOCYTESUR NEGATIVE 06/06/2017 1333   Sepsis Labs: @LABRCNTIP (procalcitonin:4,lacticidven:4) ) No results found for this or any previous visit (from the past 240 hour(s)).       Radiology Studies: No results found.    Scheduled Meds: . enoxaparin (LOVENOX) injection  40 mg Subcutaneous Q24H  . gabapentin  300 mg Oral TID  . iopamidol  20 mL Intra-articular Once  . lidocaine (PF)  5 mL Intradermal Once  . zinc sulfate  220 mg Oral Daily   Continuous Infusions: . sodium chloride    . sodium chloride 1 mL (06/22/17 1758)  .  ceFAZolin (ANCEF) IV 2 g (06/27/17 1730)  . potassium chloride       LOS: 22 days    Time spent in minutes: 35    Barnetta Chapel, MD [facility] Pager: [phone] [email or web address] Password [facility] 06/27/2017, 5:32 PM

## 2017-06-27 NOTE — Progress Notes (Signed)
[facility] for Infectious Disease  Date of Admission:  06/05/2017     Total days of antibiotics 21         ASSESSMENT/PLAN  Clifford Tucker continues to be treated for MSSA bacteremia with abscess of the chest and left hip. He has had worsening of the left hip over the last 3 days and new onset low grade fever which is concerning for the possibility of infectious origin. This appears to be a similar area to abscess noted on MRI. He continues to receive the Ancef daily for the MSSA bacteremia and cultures obtained during the I&D of his right sternoclavicular wound.  1. Continue Ancef - appears to be tolerating without adverse side effects. Will continue to need long term therapy with antibiotic end-date of 07/21/17. 2. Will check CBC w/diff, ESR, CRP and consider blood cultures if he remains febrile. 3. Consider additional imaging or possible surgical intervention per Dr. Marlou Sa.   Principal Problem:   Bacteremia due to methicillin susceptible Staphylococcus aureus (MSSA) Active Problems:   Multifocal pneumonia   Neck abscess   IV drug user   Left hip pain   Septic arthritis of sternoclavicular joint, right (HCC)   Iliopsoas abscess on left (HCC)   Normocytic anemia   Septic arthritis of hip ([place])   . enoxaparin (LOVENOX) injection  40 mg Subcutaneous Q24H  . gabapentin  300 mg Oral TID  . iopamidol  20 mL Intra-articular Once  . lidocaine (PF)  5 mL Intradermal Once  . zinc sulfate  220 mg Oral Daily    SUBJECTIVE:  Interval History:  Clifford Tucker is currently on Ancef for MSSA bacteremia with abscesses of of the neck and septic arthritis of the hip. He is on Day 21. Three days ago began to experience worsening pain located in his anterior hip/pelvis described as a general ache and is deep. Severity of the pain interferes with his walking and is worsened with movement.  He had a low grade fever of 100.6 yesterday that dissipated with no treatment.  His most recent WBC was 8.1 on  2/3. Dr. Marlou Sa will be following up today.   No Known Allergies   Review of Systems: Review of Systems  Constitutional: Positive for fever. Negative for chills and malaise/fatigue.  Respiratory: Negative for cough, shortness of breath and wheezing.   Cardiovascular: Negative for chest pain.  Musculoskeletal:       Positive for left hip pain  Skin: Negative for rash.  Neurological: Positive for weakness.      OBJECTIVE: Vitals:   06/26/17 0451 06/26/17 1440 06/26/17 2122 06/27/17 0547  BP: 114/73 115/75 (!) 98/58 107/66  Pulse: 85 85 (!) 110 (!) 104  Resp: '18 18 17 19  ' Temp: 98.5 F (36.9 C) 98 F (36.7 C) (!) 100.6 F (38.1 C) 98.4 F (36.9 C)  TempSrc: Oral Oral Oral   SpO2: 99% 99% 97% 97%  Weight:      Height:       Body mass index is 22.24 kg/m.  Physical Exam  Constitutional: He is well-developed, well-nourished, and in no distress. No distress.  Pulmonary/Chest: Effort normal and breath sounds normal. No respiratory distress. He has no wheezes. He has no rales. He exhibits no tenderness.  Sternal wound vac in place and operational. Site is clean, dry and intact with no evidence of infection.   Musculoskeletal:  Left hip with surgical incision well approximated and no tenderness. No obvious swelling, deformity, discoloration or discharge to suggest  infection. He does have tenderness of over iliopsoas and anterior pelvis. Questionable lymphadenopathy.     Lab Results Lab Results  Component Value Date   WBC 8.1 06/25/2017   HGB 9.7 (L) 06/25/2017   HCT 29.6 (L) 06/25/2017   MCV 92.8 06/25/2017   PLT 402 (H) 06/25/2017    Lab Results  Component Value Date   CREATININE 0.57 (L) 06/26/2017   BUN <5 (L) 06/25/2017   NA 138 06/25/2017   K 3.6 06/25/2017   CL 103 06/25/2017   CO2 25 06/25/2017    Lab Results  Component Value Date   ALT 36 06/09/2017   AST 42 (H) 06/09/2017   ALKPHOS 165 (H) 06/09/2017   BILITOT 0.6 06/09/2017     Microbiology: No  results found for this or any previous visit (from the past 240 hour(s)).   Terri Piedra, NP [facility] [phone] Pager  06/27/2017  8:46 AM

## 2017-06-27 NOTE — Progress Notes (Signed)
Left hip examined Incision intact Not too painful with passive or active range of motion with the patient in bed Patient states his hip feels like it did prior to the infection.  He is having groin pain which penetrates into the buttock area  Plan at this time is to perform repeat hip aspiration under fluoroscopy.  We will use that to guide whether or not we need to do a repeat I&D on the hip joint.

## 2017-06-27 NOTE — Progress Notes (Signed)
      [address]       [place] [id number]             [phone]      5 Days Post-Op Procedure(s) (LRB): Irrigation of chest wall wound with WOUND VAC CHANGE (N/A) Subjective: Vac site feels pretty good but having a lot of hip pain  Objective: Vital signs in last 24 hours: Temp:  [98 F (36.7 C)-100.6 F (38.1 C)] 98.4 F (36.9 C) (02/05 0547) Pulse Rate:  [85-110] 104 (02/05 0547) Resp:  [17-19] 19 (02/05 0547) BP: (98-115)/(58-75) 107/66 (02/05 0547) SpO2:  [97 %-99 %] 97 % (02/05 0547)  Hemodynamic parameters for last 24 hours:    Intake/Output from previous day: 02/04 0701 - 02/05 0700 In: 840 [P.O.:840] Out: -  Intake/Output this shift: No intake/output data recorded.  General appearance: alert, cooperative and no distress Heart: regular rate and rhythm Lungs: clear to auscultation bilaterally Wound: vac site ok, with no cellulitis or purulent drainage  Lab Results: Recent Labs    06/25/17 0159  WBC 8.1  HGB 9.7*  HCT 29.6*  PLT 402*   BMET:  Recent Labs    06/25/17 0159 06/25/17 1255 06/26/17 0358  NA 139 138  --   K 3.2* 3.6  --   CL 103 103  --   CO2 26 25  --   GLUCOSE 125* 91  --   BUN 6 <5*  --   CREATININE 0.53* 0.50* 0.57*  CALCIUM 8.7* 8.6*  --     PT/INR: No results for input(s): LABPROT, INR in the last 72 hours. ABG    Component Value Date/Time   PHART 7.430 06/07/2017 0940   HCO3 27.5 06/07/2017 0940   TCO2 29 06/05/2017 1457   ACIDBASEDEF 4.0 (H) 03/13/2007 0120   O2SAT 90.2 06/07/2017 0940   CBG (last 3)  No results for input(s): GLUCAP in the last 72 hours.  Meds Scheduled Meds: . enoxaparin (LOVENOX) injection  40 mg Subcutaneous Q24H  . gabapentin  300 mg Oral TID  . iopamidol  20 mL Intra-articular Once  . lidocaine (PF)  5 mL Intradermal Once  . zinc sulfate  220 mg Oral Daily   Continuous Infusions: . sodium chloride    . sodium chloride 1 mL (06/22/17 1758)  .  ceFAZolin (ANCEF) IV  Stopped (06/27/17 0236)  . potassium chloride     PRN Meds:.acetaminophen **OR** acetaminophen, alum & mag hydroxide-simeth, diphenhydrAMINE, ondansetron **OR** ondansetron (ZOFRAN) IV, oxyCODONE, potassium chloride, senna-docusate, sodium chloride flush, zolpidem  Xrays No results found.  Assessment/Plan: S/P Procedure(s) (LRB): Irrigation of chest wall wound with WOUND VAC CHANGE (N/A)  1 stable from CT surgery perspective 2 Ortho to eval status of hip infection 3 we plan dressing change in OR on thursday   LOS: 22 days    [name] 06/27/2017

## 2017-06-28 ENCOUNTER — Inpatient Hospital Stay (HOSPITAL_COMMUNITY): Payer: Self-pay

## 2017-06-28 DIAGNOSIS — T8149XA Infection following a procedure, other surgical site, initial encounter: Secondary | ICD-10-CM

## 2017-06-28 DIAGNOSIS — R102 Pelvic and perineal pain: Secondary | ICD-10-CM

## 2017-06-28 LAB — CBC WITH DIFFERENTIAL/PLATELET
Basophils Absolute: 0 10*3/uL (ref 0.0–0.1)
Basophils Relative: 0 %
EOS ABS: 0.2 10*3/uL (ref 0.0–0.7)
EOS PCT: 2 %
HCT: 32.8 % — ABNORMAL LOW (ref 39.0–52.0)
HEMOGLOBIN: 10.6 g/dL — AB (ref 13.0–17.0)
Lymphocytes Relative: 18 %
Lymphs Abs: 2 10*3/uL (ref 0.7–4.0)
MCH: 29.8 pg (ref 26.0–34.0)
MCHC: 32.3 g/dL (ref 30.0–36.0)
MCV: 92.1 fL (ref 78.0–100.0)
MONO ABS: 1.2 10*3/uL — AB (ref 0.1–1.0)
MONOS PCT: 11 %
NEUTROS PCT: 69 %
Neutro Abs: 7.8 10*3/uL — ABNORMAL HIGH (ref 1.7–7.7)
PLATELETS: 376 10*3/uL (ref 150–400)
RBC: 3.56 MIL/uL — ABNORMAL LOW (ref 4.22–5.81)
RDW: 14.5 % (ref 11.5–15.5)
WBC: 11.1 10*3/uL — ABNORMAL HIGH (ref 4.0–10.5)

## 2017-06-28 LAB — C-REACTIVE PROTEIN: CRP: 13 mg/dL — AB (ref <1.0)

## 2017-06-28 LAB — SEDIMENTATION RATE: SED RATE: 93 mm/h — AB (ref 0–16)

## 2017-06-28 MED ORDER — IOPAMIDOL (ISOVUE-M 200) INJECTION 41%
INTRAMUSCULAR | Status: AC
  Filled 2017-06-28: qty 10

## 2017-06-28 MED ORDER — GADOBENATE DIMEGLUMINE 529 MG/ML IV SOLN
15.0000 mL | Freq: Once | INTRAVENOUS | Status: AC
  Administered 2017-06-28: 15 mL via INTRAVENOUS

## 2017-06-28 MED ORDER — LIDOCAINE HCL (PF) 1 % IJ SOLN
INTRAMUSCULAR | Status: AC
  Filled 2017-06-28: qty 5

## 2017-06-28 NOTE — Progress Notes (Signed)
PROGRESS NOTE    Clifford Tucker   ZOX:[id number]  DOB: May 17, 1981  DOA: 06/05/2017 PCP: Patient, No Pcp Per   Brief Narrative:  Clifford Tucker is a 37 y.o.malewith medical history significant forIV drug abuse and recurrent skin and soft tissue infections. He presented with neck/hip pain found to have a neck abscess, septic arthritis of the hip and now MSSA bacteremia. CT surgery, orthopedic surgery and infectious disease consulted.   Subjective: Complains of severe pain in the left hip. He does not want to have a needle put into his hip ROS: no complaints of nausea, vomiting, constipation diarrhea, cough, dyspnea or dysuria. No other complaints.   Assessment & Plan:  Bacteremia due to methicillin susceptible Staphylococcus aureus (MSSA) in the setting of IV drug use -stable, no changes, afebrile, normotensive. -Blood cultures 2/2+ for MSSA   -Patient was originally placed on vancomycin and Zosyn, transition to IV Ancef -Sternal aspirate showed MSSA, left hip wound cultures also MSSA -2D echo with no vegetation.  -   PICC placed- he will either go to SNF or complete treatment in the hospital- cannot go home with PICC   Left hip abscess/iliopsoas septic arthritis, infectious myositis s/p I&D 06/06/17 -Status post left hip I&D with cultures done on 1/15, showing staph aureus -Status post drain placement 1/18 for iliacus abscess which was removed on 06/14/17 -Doppler ultrasound of the left lower extremity negative for DVT - he states he has been having persistent pain and tenderness in the left hip. Dr August Saucer contacted 06/26/17.  - Dr August Saucer would like to aspirate it but patient is hesitant to have this done- Repeat MRI of the left hip has been ordered  Septic arthritis of the sternoclavicular joint, abscess  -June 15, 2017 :status post irrigation of the right sternoclavicular wound, with a wound VAC placement -06/26/17 Wound care specialist in the room to change wound  VAC. -Cardiothoracic surgery team recommending wound VAC change Mondays, Wednesdays, Fridays Starting Monday, January 28, continue antibiotics -irrigation of chest wall wound with wound vac change 06/22/17, 06/26/17. -CTS following  Multifocal pneumonia -ID following, continue cefazolin -Possible septic emboli to the lungs. -continue close follow up   IV drug user -Per patient, has been using heroin, [facility] but relapsed again in a month before the admission -Last heroin use a week ago prior to admission, so far not in any withdrawals -polysubstance abuse counseling -start suboxone when procedures are completed -Social work consulted for rehab/SNF placement  -06/23/17: Social worker optimistic about finding placement to SNF  Nicotine abuse -Nicotine patch -Tobacco cessation counseling   DVT prophylaxis: sq Lovenox Code Status: Full code Family Communication:  None at bedside Disposition Plan: possible SNF if placement Consultants:   CT surgery, ID, ortho  Procedures:     Irrigation of chest wall with wound vac change 06/22/17.    Left hip I&D 06/06/17  Antimicrobials:  Anti-infectives (From admission, onward)   Start     Dose/Rate Route Frequency Ordered Stop   06/15/17 0816  vancomycin (VANCOCIN) powder  Status:  Discontinued       As needed 06/15/17 0817 06/15/17 0854   06/07/17 1400  cefUROXime (ZINACEF) 1.5 g in dextrose 5 % 50 mL IVPB     1.5 g 100 mL/hr over 30 Minutes Intravenous To ShortStay Surgical 06/07/17 0125 06/07/17 1527   06/07/17 0745  vancomycin (VANCOCIN) 1,000 mg in sodium chloride 0.9 % 1,000 mL irrigation      Irrigation To Surgery 06/07/17 0744 06/07/17  1513   06/06/17 1639  gentamicin (GARAMYCIN) injection  Status:  Discontinued       As needed 06/06/17 1640 06/06/17 1715   06/06/17 1638  vancomycin (VANCOCIN) powder  Status:  Discontinued       As needed 06/06/17 1639 06/06/17 1715   06/06/17 1000  ceFAZolin (ANCEF)  IVPB 2g/100 mL premix     2 g 200 mL/hr over 30 Minutes Intravenous Every 8 hours 06/06/17 0911     06/06/17 0000  vancomycin (VANCOCIN) IVPB 1000 mg/200 mL premix  Status:  Discontinued     1,000 mg 200 mL/hr over 60 Minutes Intravenous Every 8 hours 06/05/17 2211 06/06/17 0911   06/05/17 2330  piperacillin-tazobactam (ZOSYN) IVPB 3.375 g  Status:  Discontinued     3.375 g 12.5 mL/hr over 240 Minutes Intravenous Every 8 hours 06/05/17 2211 06/06/17 0911   06/05/17 2200  cefTRIAXone (ROCEPHIN) 1 g in dextrose 5 % 50 mL IVPB  Status:  Discontinued     1 g 100 mL/hr over 30 Minutes Intravenous Every 24 hours 06/05/17 2140 06/05/17 2202   06/05/17 2200  azithromycin (ZITHROMAX) 500 mg in dextrose 5 % 250 mL IVPB  Status:  Discontinued     500 mg 250 mL/hr over 60 Minutes Intravenous Every 24 hours 06/05/17 2140 06/06/17 0911   06/05/17 1600  vancomycin (VANCOCIN) 1,500 mg in sodium chloride 0.9 % 500 mL IVPB     1,500 mg 250 mL/hr over 120 Minutes Intravenous  Once 06/05/17 1542 06/05/17 1855   06/05/17 1515  piperacillin-tazobactam (ZOSYN) IVPB 3.375 g     3.375 g 100 mL/hr over 30 Minutes Intravenous  Once 06/05/17 1512 06/05/17 1557       Objective: Vitals:   06/27/17 0547 06/27/17 1436 06/27/17 2134 06/28/17 0454  BP: 107/66 (!) 97/57 110/63 108/64  Pulse: (!) 104 78 85 86  Resp: 19 18 18 18   Temp: 98.4 F (36.9 C) 98.5 F (36.9 C) 97.6 F (36.4 C) 98.4 F (36.9 C)  TempSrc:  Oral Oral Oral  SpO2: 97% 95% 98% 95%  Weight:      Height:        Intake/Output Summary (Last 24 hours) at 06/28/2017 1503 Last data filed at 06/28/2017 1300 Gross per 24 hour  Intake 2420 ml  Output 10 ml  Net 2410 ml   Filed Weights   06/05/17 1407 06/06/17 0844  Weight: 68 kg (150 lb) 70.3 kg (154 lb 15.7 oz)    Examination: General exam: Appears comfortable  HEENT: PERRLA, oral mucosa moist, no sclera icterus or thrush Respiratory system: Clear to auscultation. Respiratory effort  normal. Cardiovascular system: S1 & S2 heard, RRR.  No murmurs  Gastrointestinal system: Abdomen soft, non-tender, nondistended. Normal bowel sound. No organomegaly Central nervous system: Alert and oriented. No focal neurological deficits. Extremities: No cyanosis, clubbing or edema Skin: No rashes or ulcers Psychiatry:  Mood & affect appropriate.     Data Reviewed: I have personally reviewed following labs and imaging studies  CBC: Recent Labs  Lab 06/23/17 1135 06/25/17 0159 06/28/17 0410  WBC 15.1* 8.1 11.1*  NEUTROABS  --  4.8 7.8*  HGB 11.0* 9.7* 10.6*  HCT 32.8* 29.6* 32.8*  MCV 90.9 92.8 92.1  PLT 520* 402* 376   Basic Metabolic Panel: Recent Labs  Lab 06/25/17 0159 06/25/17 1255 06/26/17 0358  NA 139 138  --   K 3.2* 3.6  --   CL 103 103  --   CO2 26 25  --  GLUCOSE 125* 91  --   BUN 6 <5*  --   CREATININE 0.53* 0.50* 0.57*  CALCIUM 8.7* 8.6*  --   PHOS 4.0 4.4  --    GFR: Estimated Creatinine Clearance: 128.2 mL/min (A) (by C-G formula based on SCr of 0.57 mg/dL (L)). Liver Function Tests: Recent Labs  Lab 06/25/17 0159 06/25/17 1255  ALBUMIN 2.7* 2.7*   No results for input(s): LIPASE, AMYLASE in the last 168 hours. No results for input(s): AMMONIA in the last 168 hours. Coagulation Profile: No results for input(s): INR, PROTIME in the last 168 hours. Cardiac Enzymes: Recent Labs  Lab 06/24/17 1933 06/24/17 2253 06/25/17 0159  TROPONINI <0.03 <0.03 <0.03   BNP (last 3 results) No results for input(s): PROBNP in the last 8760 hours. HbA1C: No results for input(s): HGBA1C in the last 72 hours. CBG: No results for input(s): GLUCAP in the last 168 hours. Lipid Profile: No results for input(s): CHOL, HDL, LDLCALC, TRIG, CHOLHDL, LDLDIRECT in the last 72 hours. Thyroid Function Tests: No results for input(s): TSH, T4TOTAL, FREET4, T3FREE, THYROIDAB in the last 72 hours. Anemia Panel: No results for input(s): VITAMINB12, FOLATE, FERRITIN,  TIBC, IRON, RETICCTPCT in the last 72 hours. Urine analysis:    Component Value Date/Time   COLORURINE YELLOW 06/06/2017 1333   APPEARANCEUR CLEAR 06/06/2017 1333   LABSPEC 1.010 06/06/2017 1333   PHURINE 6.0 06/06/2017 1333   GLUCOSEU NEGATIVE 06/06/2017 1333   HGBUR NEGATIVE 06/06/2017 1333   BILIRUBINUR NEGATIVE 06/06/2017 1333   KETONESUR NEGATIVE 06/06/2017 1333   PROTEINUR NEGATIVE 06/06/2017 1333   UROBILINOGEN 0.2 10/03/2012 0856   NITRITE NEGATIVE 06/06/2017 1333   LEUKOCYTESUR NEGATIVE 06/06/2017 1333   Sepsis Labs: @LABRCNTIP (procalcitonin:4,lacticidven:4) )No results found for this or any previous visit (from the past 240 hour(s)).       Radiology Studies: No results found.    Scheduled Meds: . enoxaparin (LOVENOX) injection  40 mg Subcutaneous Q24H  . gabapentin  300 mg Oral TID  . iopamidol  20 mL Intra-articular Once  . iopamidol      . lidocaine (PF)  5 mL Intradermal Once  . lidocaine (PF)      . zinc sulfate  220 mg Oral Daily   Continuous Infusions: . sodium chloride    . sodium chloride 1 mL (06/22/17 1758)  .  ceFAZolin (ANCEF) IV Stopped (06/28/17 1011)  . potassium chloride       LOS: 23 days    Time spent in minutes: 35    Calvert Cantor, MD [facility] Pager: [email or web address] Password [email or web address] 06/28/2017, 3:03 PM

## 2017-06-28 NOTE — Progress Notes (Signed)
[facility]  Date of Admission:  06/05/2017     Total days of antibiotics 22         ASSESSMENT/PLAN  [name] continues to have increasing pain located in his hip/pelvis in an area consistent with previously noted abscess. He continues on Ancef currently with no adverse side effects. There is concern for re-emerging infection. He is scheduled to undergo aspiration under fluoroscopy and MRI is pending. Surgical intervention possible pending these results. He is scheduled to go to the OR tomorrow for wound vac change and A-Cell powder.   1. Continue Ancef. We will follow aspiration culture and MRI results for the left hip/groin.  2. Pain management per primary team.    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 enoxaparin (LOVENOX) injection  40 mg Subcutaneous Q24H  . gabapentin  300 mg Oral TID  . iopamidol  20 mL Intra-articular Once  . lidocaine (PF)  5 mL Intradermal Once  . zinc sulfate  220 mg Oral Daily    SUBJECTIVE:  Interval History:  Has remained afebrile with mildly elevated WBC and inflammatory markers. Seen by Dr. August Saucerean yesterday with plan for aspiration under fluoroscopy and possible I&D. MRI order placed and is pending. He continues to have increasing pain with a severity that effects his ability to walking without an antalgic gait. Denies numbness and tingling of the lower extremity.   No Known Allergies   Review of Systems: Review of Systems  Constitutional: Negative for chills and fever.  Respiratory: Negative for cough, sputum production, shortness of breath and wheezing.   Cardiovascular: Negative for chest pain and  leg swelling.  Genitourinary: Negative for dysuria, flank pain, frequency, hematuria and urgency.  Musculoskeletal:       Positive for left hip pain  Skin: Negative for rash.  Neurological: Negative for weakness.      OBJECTIVE: Vitals:   06/27/17 0547 06/27/17 1436 06/27/17 2134 06/28/17 0454  BP: 107/66 (!) 97/57 110/63 108/64  Pulse: (!) 104 78 85 86  Resp: 19 18 18 18   Temp: 98.4 F (36.9 C) 98.5 F (36.9 C) 97.6 F (36.4 C) 98.4 F (36.9 C)  TempSrc:  Oral Oral Oral  SpO2: 97% 95% 98% 95%  Weight:      Height:       Body mass index is 22.24 kg/m.  Physical Exam  Constitutional: He is well-developed, well-nourished, and in no distress. No distress.  Seated on the bed; appears restless.   Cardiovascular: Normal rate, regular rhythm, normal heart sounds and intact distal pulses. Exam reveals no gallop and no friction rub.  No murmur heard. Pulmonary/Chest: Effort normal and breath sounds normal. No respiratory distress. He has no wheezes. He has no rales. He exhibits no tenderness.  Wound vac is operational with site being clean, dry and intact.  Musculoskeletal:  Left hip/groin - Dressing is clean, dry and intact with no drainage. Continues to have pain in the area of the iliopsoas. ROM is good. Distal pulses and sensation are intact and appropriate.   Skin: Skin is warm and dry.  Psychiatric: Mood, memory, affect and judgment normal.  Lab Results Lab Results  Component Value Date   WBC 11.1 (H) 06/28/2017   HGB 10.6 (L) 06/28/2017   HCT 32.8 (L) 06/28/2017   MCV 92.1 06/28/2017   PLT 376 06/28/2017    Lab Results  Component Value Date   CREATININE 0.57 (L) 06/26/2017   BUN <5 (L) 06/25/2017   NA 138 06/25/2017   K 3.6 06/25/2017   CL 103 06/25/2017   CO2 25 06/25/2017    Lab Results  Component Value Date   ALT 36 06/09/2017   AST 42 (H) 06/09/2017   ALKPHOS 165 (H) 06/09/2017   BILITOT 0.6 06/09/2017     Microbiology: No results found for  this or any previous visit (from the past 240 hour(s)).   Marcos Eke, NP [facility] [phone] Pager  06/28/2017  11:31 AM

## 2017-06-28 NOTE — Progress Notes (Signed)
I just talked to Rosshristopher on the phone. I explained to him the rationale about re-aspirating the hip and obtaining MRI scan. He describes increasing pain As per note yesterday his hip is not too irritable on exam with him not walking around. Plan MRI and hip aspiration to determine whether or not further surgical washout is indicated Pain medicine requirements per medical team.

## 2017-06-28 NOTE — Progress Notes (Addendum)
      [address]       [place] [id number]             [phone]      6 Days Post-Op Procedure(s) (LRB): Irrigation of chest wall wound with WOUND VAC CHANGE (N/A) Subjective: He is in a lot of pain due to his left hip abscess. He states that the oral pain medicine isn't high enough to help his pain. He can't even get out of bed.   Objective: Vital signs in last 24 hours: Temp:  [97.6 F (36.4 C)-98.5 F (36.9 C)] 98.4 F (36.9 C) (02/06 0454) Pulse Rate:  [78-86] 86 (02/06 0454) Resp:  [18] 18 (02/06 0454) BP: (97-110)/(57-64) 108/64 (02/06 0454) SpO2:  [95 %-98 %] 95 % (02/06 0454)     Intake/Output from previous day: 02/05 0701 - 02/06 0700 In: 370 [P.O.:360; I.V.:10] Out: 10 [Drains:10] Intake/Output this shift: No intake/output data recorded.  General appearance: alert, cooperative and no distress Heart: regular rate and rhythm, S1, S2 normal, no murmur, click, rub or gallop Lungs: clear to auscultation bilaterally Abdomen: soft, non-tender; bowel sounds normal; no masses,  no organomegaly Extremities: extremities normal, atraumatic, no cyanosis or edema Wound: wound vac with good suction  Lab Results: Recent Labs    06/28/17 0410  WBC 11.1*  HGB 10.6*  HCT 32.8*  PLT 376   BMET:  Recent Labs    06/25/17 1255 06/26/17 0358  NA 138  --   K 3.6  --   CL 103  --   CO2 25  --   GLUCOSE 91  --   BUN <5*  --   CREATININE 0.50* 0.57*  CALCIUM 8.6*  --     PT/INR: No results for input(s): LABPROT, INR in the last 72 hours. ABG    Component Value Date/Time   PHART 7.430 06/07/2017 0940   HCO3 27.5 06/07/2017 0940   TCO2 29 06/05/2017 1457   ACIDBASEDEF 4.0 (H) 03/13/2007 0120   O2SAT 90.2 06/07/2017 0940   CBG (last 3)  No results for input(s): GLUCAP in the last 72 hours.  Assessment/Plan: S/P Procedure(s) (LRB): Irrigation of chest wall wound with WOUND VAC CHANGE (N/A)  1. Stable from a CT surgery standpoint. Good  suction on the wound vac. Plan for debridement tomorrow with wound vac placement vs. Wet to dry dressing depending on depth.  2. I and D for left hip abscess.  3. Will discuss increasing oral pain medication. According to the patient he isn't getting any relief on his current regimen and fears he will go into withdrawals. He can't walk due to the hip pain.    LOS: 23 days    Sharlene Doryessa N Conte 06/28/2017 to OR tomorrow p.m. for sternoclavicular joint VAC change patient examined and medical record reviewed,agree with above note. [name] 06/28/2017

## 2017-06-28 NOTE — Progress Notes (Signed)
Radiology Update Note   Despite extensive discussion and reassurance, patient declines hip aspiration without anesthesia.  JWatts MD

## 2017-06-28 NOTE — Progress Notes (Signed)
Patient declined a hip aspiration Just talk with the radiologist about the MRI findings Findings are concerning for possible recurrent infection Plan is to hold Lovenox and repeat I&D tomorrow

## 2017-06-28 NOTE — Consult Note (Addendum)
WOC follow-up:  Patient requested to wait until tomorrow when he goes [facility]  Spoke via telephone with Gershon CraneWayne Gold, PA.  Order received okay to not change today, will be changed tomorrow in surgery.   Please re-consult if further assistance is needed after surgery.   Thank-you,  Cammie Mcgeeawn Tekla Malachowski MSN, RN, CWOCN, [place], CNS [phone]

## 2017-06-28 NOTE — Progress Notes (Signed)
0957:  Patient requested an IV nurse come unhook him from IV since he had no meds due until 6 pm.  Nurse put in order.  IV nurse called and said bedside nurse could disconnect but [facility] policy is bedside nurse can access but cannot deaccess from a PICC line.    1321:  Transport arrived to get patient for MRI.  Patient requesting to have MRI pushed back until unhooked from PICC line.  Nurse explained that was not a legitimate reason to postpone MRI.  Nurse called MRI nurse and was told IV team could meet patient there to unhook from PICC.    1345:  MRI nurse Brooke called bedside nurse and reports patient is "irrate and being very ugly" about not being completely disconnected from PICC line.    1548:  Patient returns to floor with no connection to PICC line.    1725:  Nurse encourages patient to ambulate around the hall while disconnected from IV pole as he requested and patient refuses to ambulate.  Says it is just convenient to be disconnected so he can change clothes.  Nurse explained that patient is here d/t skin infection and he was increasing his chances of getting further infections with the mutiple daily access and deaccess of PICC line.

## 2017-06-29 ENCOUNTER — Encounter (HOSPITAL_COMMUNITY)
Admission: EM | Disposition: A | Discharge status: Home / Self Care | Payer: Self-pay | Source: Home / Self Care | Attending: Internal Medicine

## 2017-06-29 ENCOUNTER — Inpatient Hospital Stay (HOSPITAL_COMMUNITY): Payer: Self-pay | Admitting: Certified Registered Nurse Anesthetist

## 2017-06-29 ENCOUNTER — Encounter (HOSPITAL_COMMUNITY): Payer: Self-pay | Admitting: Surgery

## 2017-06-29 DIAGNOSIS — L02213 Cutaneous abscess of chest wall: Secondary | ICD-10-CM

## 2017-06-29 DIAGNOSIS — B999 Unspecified infectious disease: Secondary | ICD-10-CM

## 2017-06-29 DIAGNOSIS — M86152 Other acute osteomyelitis, left femur: Secondary | ICD-10-CM

## 2017-06-29 DIAGNOSIS — M01X52 Direct infection of left hip in infectious and parasitic diseases classified elsewhere: Secondary | ICD-10-CM

## 2017-06-29 DIAGNOSIS — M86011 Acute hematogenous osteomyelitis, right shoulder: Secondary | ICD-10-CM

## 2017-06-29 DIAGNOSIS — M00052 Staphylococcal arthritis, left hip: Secondary | ICD-10-CM

## 2017-06-29 HISTORY — PX: TOTAL HIP ARTHROPLASTY: SHX124

## 2017-06-29 HISTORY — PX: APPLICATION OF WOUND VAC: SHX5189

## 2017-06-29 SURGERY — APPLICATION, WOUND VAC
Anesthesia: General | Site: Hip | Laterality: Right

## 2017-06-29 MED ORDER — LACTATED RINGERS IV SOLN
INTRAVENOUS | Status: DC
  Administered 2017-06-29 (×3): via INTRAVENOUS

## 2017-06-29 MED ORDER — SODIUM CHLORIDE 0.9 % IJ SOLN
INTRAMUSCULAR | Status: AC
  Filled 2017-06-29: qty 10

## 2017-06-29 MED ORDER — ROCURONIUM BROMIDE 10 MG/ML (PF) SYRINGE
PREFILLED_SYRINGE | INTRAVENOUS | Status: AC
  Filled 2017-06-29: qty 5

## 2017-06-29 MED ORDER — VANCOMYCIN HCL 1000 MG IV SOLR
INTRAVENOUS | Status: DC | PRN
  Administered 2017-06-29: 1000 mg

## 2017-06-29 MED ORDER — VANCOMYCIN HCL 1000 MG IV SOLR
INTRAVENOUS | Status: AC
  Filled 2017-06-29: qty 1000

## 2017-06-29 MED ORDER — LIDOCAINE HCL (CARDIAC) 20 MG/ML IV SOLN
INTRAVENOUS | Status: DC | PRN
  Administered 2017-06-29: 60 mg via INTRAVENOUS

## 2017-06-29 MED ORDER — OXYCODONE HCL 5 MG PO TABS
7.5000 mg | ORAL_TABLET | ORAL | Status: DC | PRN
  Administered 2017-06-29 – 2017-06-30 (×5): 7.5 mg via ORAL
  Filled 2017-06-29 (×5): qty 2

## 2017-06-29 MED ORDER — VANCOMYCIN HCL 1000 MG IV SOLR
INTRAVENOUS | Status: DC | PRN
  Administered 2017-06-29: 1000 mL

## 2017-06-29 MED ORDER — FENTANYL CITRATE (PF) 100 MCG/2ML IJ SOLN
INTRAMUSCULAR | Status: DC | PRN
  Administered 2017-06-29: 100 ug via INTRAVENOUS
  Administered 2017-06-29: 50 ug via INTRAVENOUS
  Administered 2017-06-29: 100 ug via INTRAVENOUS

## 2017-06-29 MED ORDER — ENOXAPARIN SODIUM 30 MG/0.3ML ~~LOC~~ SOLN
30.0000 mg | SUBCUTANEOUS | Status: DC
  Filled 2017-06-29: qty 0.3

## 2017-06-29 MED ORDER — OXYCODONE HCL 5 MG PO TABS: 5.0000 mg | ORAL_TABLET | Freq: Once | ORAL | Status: DC | PRN

## 2017-06-29 MED ORDER — CEFAZOLIN SODIUM-DEXTROSE 2-3 GM-%(50ML) IV SOLR
INTRAVENOUS | Status: DC | PRN
  Administered 2017-06-29: 2 g via INTRAVENOUS

## 2017-06-29 MED ORDER — FENTANYL CITRATE (PF) 250 MCG/5ML IJ SOLN
INTRAMUSCULAR | Status: AC
  Filled 2017-06-29: qty 5

## 2017-06-29 MED ORDER — GENTAMICIN SULFATE 40 MG/ML IJ SOLN
INTRAMUSCULAR | Status: DC | PRN
  Administered 2017-06-29: 240 mg

## 2017-06-29 MED ORDER — KETOROLAC TROMETHAMINE 30 MG/ML IJ SOLN
30.0000 mg | Freq: Four times a day (QID) | INTRAMUSCULAR | Status: DC | PRN
  Administered 2017-06-29: 30 mg via INTRAVENOUS
  Filled 2017-06-29: qty 1

## 2017-06-29 MED ORDER — GENTAMICIN SULFATE 40 MG/ML IJ SOLN
INTRAMUSCULAR | Status: AC
  Filled 2017-06-29: qty 2

## 2017-06-29 MED ORDER — ONDANSETRON HCL 4 MG/2ML IJ SOLN
INTRAMUSCULAR | Status: DC | PRN
  Administered 2017-06-29: 4 mg via INTRAVENOUS

## 2017-06-29 MED ORDER — PROTAMINE SULFATE 10 MG/ML IV SOLN
INTRAVENOUS | Status: AC
  Filled 2017-06-29: qty 25

## 2017-06-29 MED ORDER — PROTAMINE SULFATE 10 MG/ML IV SOLN
INTRAVENOUS | Status: AC
  Filled 2017-06-29: qty 10

## 2017-06-29 MED ORDER — MIDAZOLAM HCL 2 MG/2ML IJ SOLN: 2.0000 mg | Freq: Once | INTRAMUSCULAR | Status: DC

## 2017-06-29 MED ORDER — PROPOFOL 10 MG/ML IV BOLUS
INTRAVENOUS | Status: AC
  Filled 2017-06-29: qty 20

## 2017-06-29 MED ORDER — LIDOCAINE 2% (20 MG/ML) 5 ML SYRINGE
INTRAMUSCULAR | Status: AC
  Filled 2017-06-29: qty 5

## 2017-06-29 MED ORDER — CEFAZOLIN SODIUM 1 G IJ SOLR
INTRAMUSCULAR | Status: AC
  Filled 2017-06-29: qty 20

## 2017-06-29 MED ORDER — HEPARIN SODIUM (PORCINE) 1000 UNIT/ML IJ SOLN
INTRAMUSCULAR | Status: AC
  Filled 2017-06-29: qty 1

## 2017-06-29 MED ORDER — SODIUM CHLORIDE 0.9 % IR SOLN
Status: DC | PRN
  Administered 2017-06-29 (×3): 1000 mL
  Administered 2017-06-29: 2000 mL
  Administered 2017-06-29: 1000 mL
  Administered 2017-06-29: 2000 mL

## 2017-06-29 MED ORDER — HYDROMORPHONE HCL 1 MG/ML IJ SOLN
INTRAMUSCULAR | Status: AC
  Filled 2017-06-29: qty 1

## 2017-06-29 MED ORDER — MIDAZOLAM HCL 2 MG/2ML IJ SOLN
INTRAMUSCULAR | Status: AC
  Filled 2017-06-29: qty 2

## 2017-06-29 MED ORDER — SUGAMMADEX SODIUM 200 MG/2ML IV SOLN
INTRAVENOUS | Status: AC
  Filled 2017-06-29: qty 2

## 2017-06-29 MED ORDER — HYDROMORPHONE HCL 1 MG/ML IJ SOLN
1.0000 mg | INTRAMUSCULAR | Status: DC | PRN
  Administered 2017-06-29 – 2017-06-30 (×3): 1 mg via INTRAVENOUS
  Filled 2017-06-29 (×3): qty 1

## 2017-06-29 MED ORDER — OXYCODONE HCL 5 MG/5ML PO SOLN: 5.0000 mg | Freq: Once | ORAL | Status: DC | PRN

## 2017-06-29 MED ORDER — HYDROMORPHONE HCL 1 MG/ML IJ SOLN
0.2500 mg | INTRAMUSCULAR | Status: DC | PRN
  Administered 2017-06-29 (×2): 0.5 mg via INTRAVENOUS

## 2017-06-29 MED ORDER — SUGAMMADEX SODIUM 200 MG/2ML IV SOLN
INTRAVENOUS | Status: DC | PRN
  Administered 2017-06-29: 150 mg via INTRAVENOUS

## 2017-06-29 MED ORDER — ONDANSETRON HCL 4 MG/2ML IJ SOLN: 4.0000 mg | Freq: Once | INTRAMUSCULAR | Status: DC | PRN

## 2017-06-29 MED ORDER — GENTAMICIN SULFATE 40 MG/ML IJ SOLN
INTRAMUSCULAR | Status: AC
  Filled 2017-06-29: qty 4

## 2017-06-29 MED ORDER — MIDAZOLAM HCL 5 MG/5ML IJ SOLN
INTRAMUSCULAR | Status: DC | PRN
  Administered 2017-06-29: 2 mg via INTRAVENOUS

## 2017-06-29 MED ORDER — PROPOFOL 10 MG/ML IV BOLUS
INTRAVENOUS | Status: DC | PRN
  Administered 2017-06-29: 120 mg via INTRAVENOUS

## 2017-06-29 MED ORDER — KETOROLAC TROMETHAMINE 30 MG/ML IJ SOLN
15.0000 mg | Freq: Three times a day (TID) | INTRAMUSCULAR | Status: DC | PRN
  Administered 2017-06-30 (×2): 15 mg via INTRAVENOUS
  Filled 2017-06-29 (×2): qty 1

## 2017-06-29 MED ORDER — SUCCINYLCHOLINE CHLORIDE 200 MG/10ML IV SOSY
PREFILLED_SYRINGE | INTRAVENOUS | Status: AC
  Filled 2017-06-29: qty 10

## 2017-06-29 MED ORDER — ROCURONIUM BROMIDE 100 MG/10ML IV SOLN
INTRAVENOUS | Status: DC | PRN
  Administered 2017-06-29: 50 mg via INTRAVENOUS

## 2017-06-29 SURGICAL SUPPLY — 99 items
BAG BILE T-TUBES STRL (MISCELLANEOUS) IMPLANT
BAG DECANTER FOR FLEXI CONT (MISCELLANEOUS) ×4 IMPLANT
BENZOIN TINCTURE PRP APPL 2/3 (GAUZE/BANDAGES/DRESSINGS) IMPLANT
BLADE CLIPPER SURG (BLADE) ×4 IMPLANT
BLADE SAW SGTL 18X1.27X75 (BLADE) IMPLANT
BLADE SAW SGTL 18X1.27X75MM (BLADE)
BLADE SURG 10 STRL SS (BLADE) IMPLANT
BLADE SURG 15 STRL LF DISP TIS (BLADE) IMPLANT
BLADE SURG 15 STRL SS (BLADE)
BNDG GAUZE ELAST 4 BULKY (GAUZE/BANDAGES/DRESSINGS) IMPLANT
CANISTER SUCT 3000ML PPV (MISCELLANEOUS) ×4 IMPLANT
CATH THORACIC 28FR RT ANG (CATHETERS) IMPLANT
CATH THORACIC 36FR (CATHETERS) IMPLANT
CATH THORACIC 36FR RT ANG (CATHETERS) IMPLANT
CELLS DAT CNTRL 66122 CELL SVR (MISCELLANEOUS) ×2 IMPLANT
CLIP VESOCCLUDE SM WIDE 24/CT (CLIP) IMPLANT
CLOSURE WOUND 1/2 X4 (GAUZE/BANDAGES/DRESSINGS) ×1
CONT SPEC 4OZ CLIKSEAL STRL BL (MISCELLANEOUS) IMPLANT
COVER PERINEAL POST (MISCELLANEOUS) ×4 IMPLANT
COVER SURGICAL LIGHT HANDLE (MISCELLANEOUS) ×4 IMPLANT
DRAPE C-ARM 42X72 X-RAY (DRAPES) IMPLANT
DRAPE LAPAROSCOPIC ABDOMINAL (DRAPES) ×4 IMPLANT
DRAPE SLUSH/WARMER DISC (DRAPES) IMPLANT
DRAPE STERI IOBAN 125X83 (DRAPES) ×4 IMPLANT
DRAPE U-SHAPE 47X51 STRL (DRAPES) ×8 IMPLANT
DRSG AQUACEL AG ADV 3.5X10 (GAUZE/BANDAGES/DRESSINGS) IMPLANT
DRSG AQUACEL AG ADV 3.5X14 (GAUZE/BANDAGES/DRESSINGS) ×4 IMPLANT
DRSG PAD ABDOMINAL 8X10 ST (GAUZE/BANDAGES/DRESSINGS) IMPLANT
DURAPREP 26ML APPLICATOR (WOUND CARE) ×4 IMPLANT
ELECT BLADE 4.0 EZ CLEAN MEGAD (MISCELLANEOUS)
ELECT REM PT RETURN 9FT ADLT (ELECTROSURGICAL) ×8
ELECTRODE BLDE 4.0 EZ CLN MEGD (MISCELLANEOUS) IMPLANT
ELECTRODE REM PT RTRN 9FT ADLT (ELECTROSURGICAL) ×4 IMPLANT
EVACUATOR 3/16  PVC DRAIN (DRAIN) ×2
EVACUATOR 3/16 PVC DRAIN (DRAIN) ×2 IMPLANT
GAUZE SPONGE 4X4 12PLY STRL (GAUZE/BANDAGES/DRESSINGS) ×4 IMPLANT
GAUZE XEROFORM 5X9 LF (GAUZE/BANDAGES/DRESSINGS) IMPLANT
GLOVE BIO SURGEON STRL SZ7.5 (GLOVE) ×8 IMPLANT
GLOVE BIOGEL PI IND STRL 6.5 (GLOVE) ×2 IMPLANT
GLOVE BIOGEL PI IND STRL 8 (GLOVE) ×2 IMPLANT
GLOVE BIOGEL PI INDICATOR 6.5 (GLOVE) ×2
GLOVE BIOGEL PI INDICATOR 8 (GLOVE) ×2
GLOVE BIOGEL PI ORTHO PRO SZ8 (GLOVE) ×4
GLOVE PI ORTHO PRO STRL SZ8 (GLOVE) ×4 IMPLANT
GOWN STRL REUS W/ TWL LRG LVL3 (GOWN DISPOSABLE) ×10 IMPLANT
GOWN STRL REUS W/TWL LRG LVL3 (GOWN DISPOSABLE) ×10
HANDPIECE INTERPULSE COAX TIP (DISPOSABLE)
HEMOSTAT POWDER SURGIFOAM 1G (HEMOSTASIS) IMPLANT
HEMOSTAT SURGICEL 2X14 (HEMOSTASIS) IMPLANT
HOOD PEEL AWAY FLYTE STAYCOOL (MISCELLANEOUS) IMPLANT
KIT BASIN OR (CUSTOM PROCEDURE TRAY) ×8 IMPLANT
KIT PREVENA INCISION MGT20CM45 (CANNISTER) ×4 IMPLANT
KIT ROOM TURNOVER OR (KITS) ×8 IMPLANT
KIT STIMULAN RAPID CURE  10CC (Orthopedic Implant) ×2 IMPLANT
KIT STIMULAN RAPID CURE 10CC (Orthopedic Implant) ×2 IMPLANT
KIT SUCTION CATH 14FR (SUCTIONS) IMPLANT
NEEDLE HYPO 22GX1.5 SAFETY (NEEDLE) ×4 IMPLANT
NS IRRIG 1000ML POUR BTL (IV SOLUTION) ×8 IMPLANT
PACK CHEST (CUSTOM PROCEDURE TRAY) ×4 IMPLANT
PACK TOTAL JOINT (CUSTOM PROCEDURE TRAY) ×4 IMPLANT
PAD ARMBOARD 7.5X6 YLW CONV (MISCELLANEOUS) ×12 IMPLANT
PAD NEG PRESSURE SENSATRAC (MISCELLANEOUS) ×4 IMPLANT
RTRCTR WOUND ALEXIS 18CM MED (MISCELLANEOUS) ×4
SET HNDPC FAN SPRY TIP SCT (DISPOSABLE) IMPLANT
SPONGE LAP 18X18 X RAY DECT (DISPOSABLE) IMPLANT
STAPLER VISISTAT 35W (STAPLE) IMPLANT
STRAP MONTGOMERY 1.25X11-1/8 (MISCELLANEOUS) IMPLANT
STRIP CLOSURE SKIN 1/2X4 (GAUZE/BANDAGES/DRESSINGS) ×3 IMPLANT
SUT ETHIBOND NAB CT1 #1 30IN (SUTURE) ×8 IMPLANT
SUT ETHILON 3 0 FSL (SUTURE) ×4 IMPLANT
SUT MNCRL AB 3-0 PS2 18 (SUTURE) IMPLANT
SUT PDS AB 1 CTX 36 (SUTURE) ×4 IMPLANT
SUT STEEL 6MS V (SUTURE) IMPLANT
SUT STEEL STERNAL CCS#1 18IN (SUTURE) IMPLANT
SUT STEEL SZ 6 DBL 3X14 BALL (SUTURE) IMPLANT
SUT VIC AB 0 CT1 27 (SUTURE)
SUT VIC AB 0 CT1 27XBRD ANBCTR (SUTURE) IMPLANT
SUT VIC AB 1 CTX 36 (SUTURE)
SUT VIC AB 1 CTX36XBRD ANBCTR (SUTURE) IMPLANT
SUT VIC AB 2-0 CT1 27 (SUTURE) ×2
SUT VIC AB 2-0 CT1 TAPERPNT 27 (SUTURE) ×2 IMPLANT
SUT VIC AB 2-0 CTX 27 (SUTURE) IMPLANT
SUT VIC AB 2-0 CTX 36 (SUTURE) ×4 IMPLANT
SUT VIC AB 3-0 X1 27 (SUTURE) IMPLANT
SWAB COLLECTION DEVICE MRSA (MISCELLANEOUS) IMPLANT
SWAB CULTURE ESWAB REG 1ML (MISCELLANEOUS) IMPLANT
SWAB CULTURE LIQ STUART DBL (MISCELLANEOUS) ×4 IMPLANT
SYR 30ML LL (SYRINGE) IMPLANT
SYR 5ML LL (SYRINGE) IMPLANT
SYR BULB IRRIGATION 50ML (SYRINGE) ×4 IMPLANT
TAPE CLOTH SURG 4X10 WHT LF (GAUZE/BANDAGES/DRESSINGS) ×4 IMPLANT
TOWEL GREEN STERILE (TOWEL DISPOSABLE) ×4 IMPLANT
TOWEL GREEN STERILE FF (TOWEL DISPOSABLE) ×4 IMPLANT
TOWEL OR 17X24 6PK STRL BLUE (TOWEL DISPOSABLE) ×4 IMPLANT
TOWEL OR 17X26 10 PK STRL BLUE (TOWEL DISPOSABLE) ×4 IMPLANT
TRAY FOLEY W/METER SILVER 16FR (SET/KITS/TRAYS/PACK) IMPLANT
TUBE ANAEROBIC SPECIMEN COL (MISCELLANEOUS) ×4 IMPLANT
WATER STERILE IRR 1000ML POUR (IV SOLUTION) ×4 IMPLANT
WND VAC CANISTER 500ML (MISCELLANEOUS) ×4 IMPLANT

## 2017-06-29 NOTE — Progress Notes (Signed)
[facility]  Date of Admission:  06/05/2017     Total days of antibiotics 23         ASSESSMENT/PLAN  MRI of the left hip with increased size of large left hip joint effusion tracking out of the joint to an abcess and enhancement in the left formal neck head and neck consistent with septic joint and osteomyelitis. He refused aspiration and will undergo I&D today. Chest wound remains stable and will also have the wound vac replaced with A-cell powder today as well.   1. Continue Ancef with additional treatment recommendations pending culture results from I&D of the left hip. Will continue to require at least 4-6 weeks of antibiotics for MSSA bactermia, and septic joint. Plan to discharge to SNF for completion of antibiotic therapy.  2. 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 gabapentin  300 mg Oral TID  . iopamidol  20 mL Intra-articular Once  . lidocaine (PF)  5 mL Intradermal Once  . zinc sulfate  220 mg Oral Daily    SUBJECTIVE:  Interval History:  Continues to have left hip pain that is poorly controlled with the current treatment regimen. MRI of the left hip showed improved abscess of the iliacus, however he had enlarging of an abscess leading to septic joint and osteomyelitis. Dr. August Saucer plans to take him back to the OR today for I&D. Chest is feeling good with wound vac in place. Denies, fevers, chills, night sweats, nausea, vomiting or diarrhea.   No Known Allergies   Review of Systems: Review of Systems  Constitutional: Negative for chills and fever.   Respiratory: Negative for cough, sputum production, shortness of breath and wheezing.   Cardiovascular: Negative for chest pain and leg swelling.  Gastrointestinal: Negative for constipation, diarrhea, nausea and vomiting.      OBJECTIVE: Vitals:   06/27/17 2134 06/28/17 0454 06/28/17 2019 06/29/17 0557  BP: 110/63 108/64 101/67 (!) 109/59  Pulse: 85 86  87  Resp: 18 18  20   Temp: 97.6 F (36.4 C) 98.4 F (36.9 C) 99.1 F (37.3 C) 98.6 F (37 C)  TempSrc: Oral Oral Oral Oral  SpO2: 98% 95% 97% 98%  Weight:    155 lb 3.3 oz (70.4 kg)  Height:       Body mass index is 22.27 kg/m.  Physical Exam  Constitutional: He is oriented to person, place, and time and well-developed, well-nourished, and in no distress. No distress.  Lying in the bed; expresses frustration regarding pain management  Neck: Neck supple.  Cardiovascular: Normal rate, regular rhythm, normal heart sounds and intact distal pulses. Exam reveals no gallop and no friction rub.  No murmur heard. Pulmonary/Chest: Effort normal and breath sounds normal. No respiratory distress. He has no wheezes. He has no rales. He exhibits no tenderness.  Wound vac site is clean, dry and intact. Operational with good suction.   Abdominal: Soft. Bowel sounds are normal. He exhibits no distension.  Musculoskeletal:  Hip exam unchanged from previous  Lymphadenopathy:    He has  no cervical adenopathy.  Neurological: He is alert and oriented to person, place, and time.  Skin: Skin is warm and dry. No rash noted.  Psychiatric: Mood, memory, affect and judgment normal.    Lab Results Lab Results  Component Value Date   WBC 11.1 (H) 06/28/2017   HGB 10.6 (L) 06/28/2017   HCT 32.8 (L) 06/28/2017   MCV 92.1 06/28/2017   PLT 376 06/28/2017    Lab Results  Component Value Date   CREATININE 0.57 (L) 06/26/2017   BUN <5 (L) 06/25/2017   NA 138 06/25/2017   K 3.6 06/25/2017   CL 103 06/25/2017   CO2 25 06/25/2017    Lab  Results  Component Value Date   ALT 36 06/09/2017   AST 42 (H) 06/09/2017   ALKPHOS 165 (H) 06/09/2017   BILITOT 0.6 06/09/2017     Microbiology: No results found for this or any previous visit (from the past 240 hour(s)).   Marcos EkeGreg Calone, NP [facility] [phone] Pager  06/29/2017  9:18 AM

## 2017-06-29 NOTE — Anesthesia Procedure Notes (Signed)
Procedure Name: Intubation Date/Time: 06/29/2017 3:21 PM Performed by: Alistair Senft T, CRNA Pre-anesthesia Checklist: Patient identified, Emergency Drugs available, Suction available and Patient being monitored Patient Re-evaluated:Patient Re-evaluated prior to induction Oxygen Delivery Method: Circle system utilized Preoxygenation: Pre-oxygenation with 100% oxygen Induction Type: IV induction Ventilation: Mask ventilation without difficulty Laryngoscope Size: Miller and 3 Grade View: Grade I Tube type: Oral Tube size: 7.5 mm Number of attempts: 1 Airway Equipment and Method: Patient positioned with wedge pillow and Stylet Placement Confirmation: ETT inserted through vocal cords under direct vision,  positive ETCO2 and breath sounds checked- equal and bilateral Secured at: 22 cm Tube secured with: Tape Dental Injury: Teeth and Oropharynx as per pre-operative assessment

## 2017-06-29 NOTE — Brief Op Note (Addendum)
06/05/2017 - 06/29/2017  3:53 PM  PATIENT:  Clifford Tucker  37 y.o. male  PRE-OPERATIVE DIAGNOSIS:  CHEST WOUND INFECTED LEFT HIP  POST-OPERATIVE DIAGNOSIS:  CHEST WOUND INFECTED LEFT HIP  PROCEDURE:  Irrigation and dressing change Right Sterno-clavicular  joint SURGEON:  Surgeon(s) and Role: Panel 1:    * Kerin PernaVan Trigt, Janah Mcculloh, MD - Primary Panel 2:    * Cammy Copaean, Gregory Scott, MD - Primary  PHYSICIAN ASSISTANT: NA  ASSISTANTS: none   ANESTHESIA:   general  EBL:  minimal  BLOOD ADMINISTERED:none  DRAINS: none and Right sternoclavicular wound packed wet to dry   LOCAL MEDICATIONS USED:  NONE  SPECIMEN:  No Specimen  DISPOSITION OF SPECIMEN:  N/A  COUNTS:  YES  TOURNIQUET:  * No tourniquets in log *  DICTATION: .Dragon Dictation  PLAN OF CARE: return to 6 North room  PATIENT DISPOSITION:  PACU - hemodynamically stable.   Delay start of Pharmacological VTE agent (>24hrs) due to surgical blood loss or risk of bleeding: yes   Findings- chest wound with clean granulation tissue- wound vac not needed. Will transition to BID wet to dry packing

## 2017-06-29 NOTE — Progress Notes (Signed)
Back from PACU, alert,oriented,transported via bed.

## 2017-06-29 NOTE — Progress Notes (Signed)
Pre Procedure note for inpatients:   Clifford Tucker has been scheduled for Procedure(s): WOUND VAC CHANGE WITH A-CELL POWDER (N/A) I&D LEFT HIP/ANTERIOR (Left) today. The various methods of treatment have been discussed with the patient. After consideration of the risks, benefits and treatment options the patient has consented to the planned procedure.   The patient has been seen and labs reviewed. There are no changes in the patient's condition to prevent proceeding with the planned procedure today.  Recent labs:  Lab Results  Component Value Date   WBC 11.1 (H) 06/28/2017   HGB 10.6 (L) 06/28/2017   HCT 32.8 (L) 06/28/2017   PLT 376 06/28/2017   GLUCOSE 91 06/25/2017   ALT 36 06/09/2017   AST 42 (H) 06/09/2017   NA 138 06/25/2017   K 3.6 06/25/2017   CL 103 06/25/2017   CREATININE 0.57 (L) 06/26/2017   BUN <5 (L) 06/25/2017   CO2 25 06/25/2017   INR 1.11 06/07/2017    Kerin PernaPeter Van Trigt III, MD 06/29/2017 2:02 PM

## 2017-06-29 NOTE — Progress Notes (Signed)
Vitals taken upon arrival to the floor

## 2017-06-29 NOTE — Progress Notes (Signed)
Plan for repeat left hip I&D.  Surgical cultures to be obtained.  Plan to do this in concurrent time with cardiothoracic procedure.

## 2017-06-29 NOTE — Anesthesia Postprocedure Evaluation (Signed)
Anesthesia Post Note  Patient: Clifford Tucker  Procedure(s) Performed: REMOVAL OF WOUND VAC AND IRRIGATION AND DEBRIDEMENT RIGHT CHEST WOUND (Right Chest) I&D LEFT HIP/ANTERIOR, Stimulan beads, (Left Hip) APPLICATION OF INCISIONAL  WOUND VAC LEFT HIP (Left Hip)     Patient location during evaluation: PACU Anesthesia Type: General Level of consciousness: awake and alert Pain management: pain level controlled Vital Signs Assessment: post-procedure vital signs reviewed and stable Respiratory status: spontaneous breathing, nonlabored ventilation, respiratory function stable and patient connected to nasal cannula oxygen Cardiovascular status: blood pressure returned to baseline and stable Postop Assessment: no apparent nausea or vomiting Anesthetic complications: no    Last Vitals:  Vitals:   06/29/17 1759 06/29/17 1800  BP: (!) 99/56 93/62  Pulse: 72 72  Resp: 14 (!) 7  Temp:    SpO2: 98% 97%    Last Pain:  Vitals:   06/29/17 1758  TempSrc:   PainSc: Asleep    LLE Motor Response: Purposeful movement (06/29/17 1758) LLE Sensation: Full sensation (06/29/17 1758) RLE Motor Response: Purposeful movement (06/29/17 1758) RLE Sensation: Full sensation (06/29/17 1758)      Calea Hribar COKER

## 2017-06-29 NOTE — Progress Notes (Signed)
PROGRESS NOTE    Clifford Tucker   ZOX:[id number]  DOB: Oct 10, 1980  DOA: 06/05/2017 PCP: Patient, No Pcp Per   Brief Narrative:  Clifford Tucker is a 37 y.o.malewith medical history significant forIV drug abuse and recurrent skin and soft tissue infections. He presented with neck/hip pain found to have a neck abscess, septic arthritis of the hip and now MSSA bacteremia. CT surgery, orthopedic surgery and infectious disease consulted.   Subjective: Complains of severe pain in the left hip. He does not want to have a needle put into his hip ROS: no complaints of nausea, vomiting, constipation diarrhea, cough, dyspnea or dysuria. No other complaints.   Assessment & Plan:  Bacteremia due to methicillin susceptible Staphylococcus aureus (MSSA) in the setting of IV drug use -stable, no changes, afebrile, normotensive. -Blood cultures 2/2+ for MSSA   -Patient was originally placed on vancomycin and Zosyn, transition to IV Ancef -Sternal aspirate showed MSSA, left hip wound cultures also MSSA -2D echo with no vegetation.  -   PICC placed- he will either go to SNF or complete treatment in the hospital- cannot go home with PICC   Left hip abscess/iliopsoas septic arthritis, infectious myositis s/p I&D 06/06/17 -Status post left hip I&D with cultures done on 1/15, showing staph aureus -Status post drain placement 1/18 for iliacus abscess which was removed on 06/14/17 -Doppler ultrasound of the left lower extremity negative for DVT - he states he has been having persistent pain and tenderness in the left hip. Dr August Saucer contacted 06/26/17. - MRI show fluid collection around the hip  - Dr August Saucer and IR would like to aspirate it but patient is hesitant to have this done-  The patient will go to the OR for another I and D  Septic arthritis of the sternoclavicular joint, abscess  -June 15, 2017 :status post irrigation of the right sternoclavicular wound, with a wound VAC placement -06/26/17  Wound care specialist in the room to change wound VAC. -Cardiothoracic surgery team recommending wound VAC change Mondays, Wednesdays, Fridays Starting Monday, January 28, continue antibiotics -irrigation of chest wall wound with wound vac change 06/22/17, 06/26/17. -CTS following  Multifocal pneumonia -Possible septic emboli to the lungs.   IV drug user -Per patient, has been using heroin, [facility] but relapsed again in a month before the admission -Last heroin use a week ago prior to admission, so far not in any withdrawals -polysubstance abuse counseling -start suboxone when procedures are completed -Social work consulted for rehab/SNF placement  -06/23/17: Social worker optimistic about finding placement to SNF  Nicotine abuse -Nicotine patch -Tobacco cessation counseling   DVT prophylaxis: sq Lovenox Code Status: Full code Family Communication:  None at bedside Disposition Plan: possible SNF if placement Consultants:   CT surgery, ID, ortho  Procedures:     Irrigation of chest wall with wound vac change 06/22/17.    Left hip I&D 06/06/17  Antimicrobials:  Anti-infectives (From admission, onward)   Start     Dose/Rate Route Frequency Ordered Stop   06/29/17 1345  [MAR Hold]  vancomycin (VANCOCIN) 1,000 mg in sodium chloride 0.9 % 1,000 mL irrigation     (MAR Hold since 06/29/17 1332)    Irrigation To Surgery 06/29/17 1330 06/30/17 1345   06/15/17 0816  vancomycin (VANCOCIN) powder  Status:  Discontinued       As needed 06/15/17 0817 06/15/17 0854   06/07/17 1400  cefUROXime (ZINACEF) 1.5 g in dextrose 5 % 50 mL IVPB  1.5 g 100 mL/hr over 30 Minutes Intravenous To [facility] 06/07/17 0125 06/07/17 1527   06/07/17 0745  vancomycin (VANCOCIN) 1,000 mg in sodium chloride 0.9 % 1,000 mL irrigation      Irrigation To Surgery 06/07/17 0744 06/07/17 1513   06/06/17 1639  gentamicin (GARAMYCIN) injection  Status:  Discontinued       As  needed 06/06/17 1640 06/06/17 1715   06/06/17 1638  vancomycin (VANCOCIN) powder  Status:  Discontinued       As needed 06/06/17 1639 06/06/17 1715   06/06/17 1000  [MAR Hold]  ceFAZolin (ANCEF) IVPB 2g/100 mL premix     (MAR Hold since 06/29/17 1332)   2 g 200 mL/hr over 30 Minutes Intravenous Every 8 hours 06/06/17 0911     06/06/17 0000  vancomycin (VANCOCIN) IVPB 1000 mg/200 mL premix  Status:  Discontinued     1,000 mg 200 mL/hr over 60 Minutes Intravenous Every 8 hours 06/05/17 2211 06/06/17 0911   06/05/17 2330  piperacillin-tazobactam (ZOSYN) IVPB 3.375 g  Status:  Discontinued     3.375 g 12.5 mL/hr over 240 Minutes Intravenous Every 8 hours 06/05/17 2211 06/06/17 0911   06/05/17 2200  cefTRIAXone (ROCEPHIN) 1 g in dextrose 5 % 50 mL IVPB  Status:  Discontinued     1 g 100 mL/hr over 30 Minutes Intravenous Every 24 hours 06/05/17 2140 06/05/17 2202   06/05/17 2200  azithromycin (ZITHROMAX) 500 mg in dextrose 5 % 250 mL IVPB  Status:  Discontinued     500 mg 250 mL/hr over 60 Minutes Intravenous Every 24 hours 06/05/17 2140 06/06/17 0911   06/05/17 1600  vancomycin (VANCOCIN) 1,500 mg in sodium chloride 0.9 % 500 mL IVPB     1,500 mg 250 mL/hr over 120 Minutes Intravenous  Once 06/05/17 1542 06/05/17 1855   06/05/17 1515  piperacillin-tazobactam (ZOSYN) IVPB 3.375 g     3.375 g 100 mL/hr over 30 Minutes Intravenous  Once 06/05/17 1512 06/05/17 1557       Objective: Vitals:   06/27/17 2134 06/28/17 0454 06/28/17 2019 06/29/17 0557  BP: 110/63 108/64 101/67 (!) 109/59  Pulse: 85 86  87  Resp: 18 18  20   Temp: 97.6 F (36.4 C) 98.4 F (36.9 C) 99.1 F (37.3 C) 98.6 F (37 C)  TempSrc: Oral Oral Oral Oral  SpO2: 98% 95% 97% 98%  Weight:    70.4 kg (155 lb 3.3 oz)  Height:        Intake/Output Summary (Last 24 hours) at 06/29/2017 1357 Last data filed at 06/29/2017 0858 Gross per 24 hour  Intake 370 ml  Output 0 ml  Net 370 ml   Filed Weights   06/05/17 1407  06/06/17 0844 06/29/17 0557  Weight: 68 kg (150 lb) 70.3 kg (154 lb 15.7 oz) 70.4 kg (155 lb 3.3 oz)    Examination: General exam: Appears comfortable  HEENT: PERRLA, oral mucosa moist, no sclera icterus or thrush Respiratory system: Clear to auscultation. Respiratory effort normal. Cardiovascular system: S1 & S2 heard, RRR.  No murmurs  Gastrointestinal system: Abdomen soft, non-tender, nondistended. Normal bowel sound. No organomegaly Central nervous system: Alert and oriented. No focal neurological deficits. Extremities: No cyanosis, clubbing or edema Skin: No rashes or ulcers Psychiatry:  Mood & affect appropriate.     Data Reviewed: I have personally reviewed following labs and imaging studies  CBC: Recent Labs  Lab 06/23/17 1135 06/25/17 0159 06/28/17 0410  WBC 15.1* 8.1 11.1*  NEUTROABS  --  4.8 7.8*  HGB 11.0* 9.7* 10.6*  HCT 32.8* 29.6* 32.8*  MCV 90.9 92.8 92.1  PLT 520* 402* 376   Basic Metabolic Panel: Recent Labs  Lab 06/25/17 0159 06/25/17 1255 06/26/17 0358  NA 139 138  --   K 3.2* 3.6  --   CL 103 103  --   CO2 26 25  --   GLUCOSE 125* 91  --   BUN 6 <5*  --   CREATININE 0.53* 0.50* 0.57*  CALCIUM 8.7* 8.6*  --   PHOS 4.0 4.4  --    GFR: Estimated Creatinine Clearance: 128.3 mL/min (A) (by C-G formula based on SCr of 0.57 mg/dL (L)). Liver Function Tests: Recent Labs  Lab 06/25/17 0159 06/25/17 1255  ALBUMIN 2.7* 2.7*   No results for input(s): LIPASE, AMYLASE in the last 168 hours. No results for input(s): AMMONIA in the last 168 hours. Coagulation Profile: No results for input(s): INR, PROTIME in the last 168 hours. Cardiac Enzymes: Recent Labs  Lab 06/24/17 1933 06/24/17 2253 06/25/17 0159  TROPONINI <0.03 <0.03 <0.03   BNP (last 3 results) No results for input(s): PROBNP in the last 8760 hours. HbA1C: No results for input(s): HGBA1C in the last 72 hours. CBG: No results for input(s): GLUCAP in the last 168 hours. Lipid  Profile: No results for input(s): CHOL, HDL, LDLCALC, TRIG, CHOLHDL, LDLDIRECT in the last 72 hours. Thyroid Function Tests: No results for input(s): TSH, T4TOTAL, FREET4, T3FREE, THYROIDAB in the last 72 hours. Anemia Panel: No results for input(s): VITAMINB12, FOLATE, FERRITIN, TIBC, IRON, RETICCTPCT in the last 72 hours. Urine analysis:    Component Value Date/Time   COLORURINE YELLOW 06/06/2017 1333   APPEARANCEUR CLEAR 06/06/2017 1333   LABSPEC 1.010 06/06/2017 1333   PHURINE 6.0 06/06/2017 1333   GLUCOSEU NEGATIVE 06/06/2017 1333   HGBUR NEGATIVE 06/06/2017 1333   BILIRUBINUR NEGATIVE 06/06/2017 1333   KETONESUR NEGATIVE 06/06/2017 1333   PROTEINUR NEGATIVE 06/06/2017 1333   UROBILINOGEN 0.2 10/03/2012 0856   NITRITE NEGATIVE 06/06/2017 1333   LEUKOCYTESUR NEGATIVE 06/06/2017 1333   Sepsis Labs: @LABRCNTIP (procalcitonin:4,lacticidven:4) )No results found for this or any previous visit (from the past 240 hour(s)).       Radiology Studies: Mr Hip Left W Wo Contrast  Result Date: 06/28/2017 CLINICAL DATA:  Severe left hip pain for several weeks in a patient with a history of IV drug abuse, MSSA bacteremia and septic left hip. The patient is status post placement of a drainage catheter in the left iliacus muscle 06/09/2017. Status post incision and drainage of the left hip 06/07/2017. EXAM: MRI OF THE LEFT HIP WITHOUT AND WITH CONTRAST TECHNIQUE: Multiplanar, multisequence MR imaging was performed both before and after administration of intravenous contrast. CONTRAST:  15 ml MULTIHANCE GADOBENATE DIMEGLUMINE 529 MG/ML IV SOLN COMPARISON:  MRI of the left hip 06/06/2017. FINDINGS: Bones: There is new marrow edema and enhancement in the left femoral head and acetabulum consistent with osteomyelitis. Bone marrow signal is otherwise normal. No fracture. Articular cartilage and labrum Articular cartilage:  Preserved. Labrum:  Intact. Joint or bursal effusion Joint effusion: The patient  has a large left hip joint effusion which has increased in size since the prior examination. Fluid tracks out of the hip deep to the left tensor fascia lata where an abscess measuring 1.3 cm AP by 0.9 cm transverse by 6 cm craniocaudal. No SI joint effusion, right hip effusion or symphysis pubis effusion is identified. Bursae: Trace amount of fluid is seen in  the left trochanteric bursa. Otherwise negative. Muscles and tendons Muscles and tendons: Abscess in the left iliacus muscle seen on the prior examination appears resolved. There is mild residual edema and enhancement within the muscle. Other findings Miscellaneous:   None. IMPRESSION: Mixed response to treatment with increase in the size of a large left hip joint effusion which tracks out of the joint deep to the tensor fascia lata where an abscess measuring 1.3 cm AP x 0.9 cm transverse x 6 cm craniocaudal is identified. New marrow edema and enhancement in the left femoral neck head and neck consistent with septic joint and osteomyelitis. Large abscess in the left iliacus muscle seen on the prior MRI has resolved. Electronically Signed   By: Drusilla Kanner M.D.   On: 06/28/2017 15:36      Scheduled Meds: . [MAR Hold] gabapentin  300 mg Oral TID  . [MAR Hold] iopamidol  20 mL Intra-articular Once  . [MAR Hold] lidocaine (PF)  5 mL Intradermal Once  . [MAR Hold] vancomycin 1000 mg in NS (1000 ml) irrigation for Dr. Cornelius Moras case   Irrigation To OR  . [MAR Hold] zinc sulfate  220 mg Oral Daily   Continuous Infusions: . sodium chloride 1 mL (06/22/17 1758)  . [MAR Hold]  ceFAZolin (ANCEF) IV 2 g (06/29/17 0854)  . lactated ringers 10 mL/hr at 06/29/17 1347  . [MAR Hold] potassium chloride       LOS: 24 days    Time spent in minutes: 35    Calvert Cantor, MD [facility] Pager: [email or web address] Password [email or web address] 06/29/2017, 1:57 PM

## 2017-06-29 NOTE — Brief Op Note (Signed)
06/29/2017  7:33 PM  PATIENT:  Lance Boschhristopher Mazzola  37 y.o. male  PRE-OPERATIVE DIAGNOSIS:  CHEST WOUND INFECTED LEFT HIP  POST-OPERATIVE DIAGNOSIS:  CHEST WOUND INFECTED LEFT HIP  PROCEDURE:  Procedure(s): REMOVAL OF WOUND VAC AND IRRIGATION AND DEBRIDEMENT RIGHT CHEST WOUND I&D LEFT HIP/ANTERIOR, Stimulan beads, APPLICATION OF INCISIONAL  WOUND VAC LEFT HIP  SURGEON:  Surgeon(s): Kerin PernaVan Trigt, Peter, MD Cammy Copaean, Gregory Scott, MD  ASSISTANT: Hart CarwinJustin Queen RNFA  ANESTHESIA:   general  EBL: 50 ml    No intake/output data recorded.  BLOOD ADMINISTERED: none  DRAINS: Hemovac drain in the left hip region and incisional wound VAC over the left hip incision   LOCAL MEDICATIONS USED:  none  SPECIMEN: Cultures sent to microbiology  COUNTS:  YES  TOURNIQUET:  * No tourniquets in log *  DICTATION: .Other Dictation: Dictation Number [id number]  PLAN OF CARE: Admit to inpatient   PATIENT DISPOSITION:  PACU - hemodynamically stable

## 2017-06-29 NOTE — Anesthesia Preprocedure Evaluation (Signed)
Anesthesia Evaluation  Patient identified by MRN, date of birth, ID band Patient awake    Reviewed: Allergy & Precautions, NPO status , Patient's Chart, lab work & pertinent test results  Airway Mallampati: II  TM Distance: >3 FB Neck ROM: Full    Dental  (+) Teeth Intact, Dental Advisory Given   Pulmonary Current Smoker,    breath sounds clear to auscultation       Cardiovascular  Rhythm:Regular Rate:Normal     Neuro/Psych    GI/Hepatic   Endo/Other    Renal/GU      Musculoskeletal   Abdominal   Peds  Hematology   Anesthesia Other Findings   Reproductive/Obstetrics                             Anesthesia Physical Anesthesia Plan  ASA: III  Anesthesia Plan: General   Post-op Pain Management:    Induction: Intravenous  PONV Risk Score and Plan: Ondansetron and Dexamethasone  Airway Management Planned: Oral ETT  Additional Equipment:   Intra-op Plan:   Post-operative Plan: Extubation in OR  Informed Consent: I have reviewed the patients History and Physical, chart, labs and discussed the procedure including the risks, benefits and alternatives for the proposed anesthesia with the patient or authorized representative who has indicated his/her understanding and acceptance.   Dental advisory given  Plan Discussed with: CRNA and Anesthesiologist  Anesthesia Plan Comments: ( 37 year old male with h/o IV drug abuse and Hep C. Now with R. Sternoclavicular joint infection with wound VAC and L. Hip abscess with MSSA  LPlan GA with oral ETT  Kipp Broodavid Lovey Crupi)        Anesthesia Quick Evaluation

## 2017-06-29 NOTE — Transfer of Care (Signed)
Immediate Anesthesia Transfer of Care Note  Patient: Clifford Tucker  Procedure(s) Performed: REMOVAL OF WOUND VAC AND IRRIGATION AND DEBRIDEMENT RIGHT CHEST WOUND (Right Chest) I&D LEFT HIP/ANTERIOR, Stimulan beads, (Left Hip) APPLICATION OF INCISIONAL  WOUND VAC LEFT HIP (Left Hip)  Patient Location: PACU  Anesthesia Type:General  Level of Consciousness: awake, alert  and oriented  Airway & Oxygen Therapy: Patient Spontanous Breathing  Post-op Assessment: Report given to RN, Post -op Vital signs reviewed and stable and Patient moving all extremities  Post vital signs: Reviewed and stable  Last Vitals:  Vitals:   06/29/17 0557 06/29/17 1721  BP: (!) 109/59   Pulse: 87   Resp: 20   Temp: 37 C 36.6 C  SpO2: 98%     Last Pain:  Vitals:   06/29/17 0917  TempSrc:   PainSc: 7       Patients Stated Pain Goal: 3 (06/29/17 0917)  Complications: No apparent anesthesia complications

## 2017-06-30 ENCOUNTER — Encounter (HOSPITAL_COMMUNITY): Payer: Self-pay | Admitting: Cardiothoracic Surgery

## 2017-06-30 MED ORDER — HEPARIN SOD (PORK) LOCK FLUSH 100 UNIT/ML IV SOLN
250.0000 [IU] | Freq: Every day | INTRAVENOUS | Status: DC
  Filled 2017-06-30: qty 2.5

## 2017-06-30 MED ORDER — HEPARIN SOD (PORK) LOCK FLUSH 100 UNIT/ML IV SOLN: 250.0000 [IU] | INTRAVENOUS | Status: DC | PRN

## 2017-06-30 MED ORDER — OXYCODONE HCL 5 MG PO TABS
10.0000 mg | ORAL_TABLET | ORAL | Status: AC | PRN
  Administered 2017-06-30 – 2017-08-01 (×188): 10 mg via ORAL
  Filled 2017-06-30 (×189): qty 2

## 2017-06-30 NOTE — Progress Notes (Signed)
Pt stable Dc hemovac am Mri pelvis to r/o abcess Ok to ambulate

## 2017-06-30 NOTE — Consult Note (Addendum)
WOC follow-up: pt went to surgery yesterday with the CT surgery team and sternal vac has been removed, according to the EMR.  Dressing change orders have been provided for the bedside nurses to perform.  Please refer to CT surgery for further questions. Ortho service is following for assessment and plan of care for the post-op hip wound. An incisional Vac has been applied, which does not require dressing changes. Please refer to their team for further questions.  Please re-consult if further assistance is needed.  Thank-you,  Cammie Mcgeeawn Keslee Harrington MSN, RN, CWOCN, [place], CNS [phone]

## 2017-06-30 NOTE — Progress Notes (Signed)
PROGRESS NOTE    Clifford Tucker   ZOX:[id number]  DOB: 1981/01/26  DOA: 06/05/2017 PCP: Patient, No Pcp Per   Brief Narrative:  Clifford Tucker is a 37 y.o.malewith medical history significant forIV drug abuse and recurrent skin and soft tissue infections. He presented with neck/hip pain found to have a neck abscess, septic arthritis of the hip and now MSSA bacteremia. CT surgery, orthopedic surgery and infectious disease consulted.   Subjective: Left hip pain has improved, he is walking today and would like to d/c the Dilaudid and go back up on the Percocet.  ROS: no complaints of nausea, vomiting, constipation diarrhea, cough, dyspnea or dysuria. No other complaints.   Assessment & Plan:  Bacteremia due to methicillin susceptible Staphylococcus aureus (MSSA) in the setting of IV drug use -stable, no changes, afebrile, normotensive. -Blood cultures 2/2+ for MSSA   -Patient was originally placed on vancomycin and Zosyn, transition to IV Ancef -Sternal aspirate showed MSSA, left hip wound cultures also MSSA -2D echo with no vegetation.  - PICC placed- cannot go home with PICC- per social work, he cannot go to SNF either as he is suspected to be tampering with his IV and also has been smoking in the bathroom   Left hip abscess/iliopsoas septic arthritis, infectious myositis s/p I&D 06/06/17 -Status post left hip I&D with cultures done on 1/15, showing staph aureus -Status post drain placement 1/18 for iliacus abscess which was removed on 06/14/17 -Doppler ultrasound of the left lower extremity negative for DVT - he states he has been having persistent pain and tenderness in the left hip. Dr August Saucer contacted 06/26/17. - MRI show fluid collection around the hip - Dr August Saucer and IR would like to aspirate it but patient is hesitant to have this done - 2/7- The patient was taken to the OR for another I and D  Septic arthritis of the sternoclavicular joint, abscess  -June 15, 2017  :status post irrigation of the right sternoclavicular wound, with a wound VAC placement - management per CT surgery   Multifocal pneumonia -Possible septic emboli to the lungs.   IV drug user -Per patient, has been using heroin- [facility] but relapsed again in a month before the admission -Last heroin use a week ago prior to admission- no withdrawal noted in the hospital -polysubstance abuse counseling  Nicotine abuse -Nicotine patch -Tobacco cessation counseling- has been smoking in the bathroom and today he was not able to be found for about 30 min when he when off the floor.    DVT prophylaxis: sq Lovenox Code Status: Full code Family Communication:  None at bedside Disposition Plan: will stay at [facility] until IV antibiotic course complete Consultants:   CT surgery, ID, ortho, IR Procedures:     Irrigation of chest wall with wound vac change 06/22/17.    Left hip I&D 06/06/17  Antimicrobials:  Anti-infectives (From admission, onward)   Start     Dose/Rate Route Frequency Ordered Stop   06/29/17 1517  gentamicin (GARAMYCIN) injection  Status:  Discontinued       As needed 06/29/17 1518 06/29/17 1714   06/29/17 1512  vancomycin (VANCOCIN) powder  Status:  Discontinued       As needed 06/29/17 1515 06/29/17 1714   06/29/17 1450  vancomycin (VANCOCIN) 1,000 mg in sodium chloride 0.9 % 1,000 mL irrigation  Status:  Discontinued       As needed 06/29/17 1451 06/29/17 1714   06/29/17 1345  vancomycin (  VANCOCIN) 1,000 mg in sodium chloride 0.9 % 1,000 mL irrigation      Irrigation To Surgery 06/29/17 1330 06/30/17 1345   06/15/17 0816  vancomycin (VANCOCIN) powder  Status:  Discontinued       As needed 06/15/17 0817 06/15/17 0854   06/07/17 1400  cefUROXime (ZINACEF) 1.5 g in dextrose 5 % 50 mL IVPB     1.5 g 100 mL/hr over 30 Minutes Intravenous To ShortStay Surgical 06/07/17 0125 06/07/17 1527   06/07/17 0745  vancomycin (VANCOCIN) 1,000 mg  in sodium chloride 0.9 % 1,000 mL irrigation      Irrigation To Surgery 06/07/17 0744 06/07/17 1513   06/06/17 1639  gentamicin (GARAMYCIN) injection  Status:  Discontinued       As needed 06/06/17 1640 06/06/17 1715   06/06/17 1638  vancomycin (VANCOCIN) powder  Status:  Discontinued       As needed 06/06/17 1639 06/06/17 1715   06/06/17 1000  ceFAZolin (ANCEF) IVPB 2g/100 mL premix     2 g 200 mL/hr over 30 Minutes Intravenous Every 8 hours 06/06/17 0911     06/06/17 0000  vancomycin (VANCOCIN) IVPB 1000 mg/200 mL premix  Status:  Discontinued     1,000 mg 200 mL/hr over 60 Minutes Intravenous Every 8 hours 06/05/17 2211 06/06/17 0911   06/05/17 2330  piperacillin-tazobactam (ZOSYN) IVPB 3.375 g  Status:  Discontinued     3.375 g 12.5 mL/hr over 240 Minutes Intravenous Every 8 hours 06/05/17 2211 06/06/17 0911   06/05/17 2200  cefTRIAXone (ROCEPHIN) 1 g in dextrose 5 % 50 mL IVPB  Status:  Discontinued     1 g 100 mL/hr over 30 Minutes Intravenous Every 24 hours 06/05/17 2140 06/05/17 2202   06/05/17 2200  azithromycin (ZITHROMAX) 500 mg in dextrose 5 % 250 mL IVPB  Status:  Discontinued     500 mg 250 mL/hr over 60 Minutes Intravenous Every 24 hours 06/05/17 2140 06/06/17 0911   06/05/17 1600  vancomycin (VANCOCIN) 1,500 mg in sodium chloride 0.9 % 500 mL IVPB     1,500 mg 250 mL/hr over 120 Minutes Intravenous  Once 06/05/17 1542 06/05/17 1855   06/05/17 1515  piperacillin-tazobactam (ZOSYN) IVPB 3.375 g     3.375 g 100 mL/hr over 30 Minutes Intravenous  Once 06/05/17 1512 06/05/17 1557       Objective: Vitals:   06/29/17 1831 06/29/17 2120 06/30/17 0602 06/30/17 1024  BP: (!) 98/55 105/64 (!) 100/57 106/65  Pulse: 75 91 91 95  Resp: 16 18 18    Temp: 98 F (36.7 C) 98.2 F (36.8 C) 98.9 F (37.2 C) 98.5 F (36.9 C)  TempSrc: Oral Oral Oral Oral  SpO2: 100% 100% 95% 99%  Weight:      Height:        Intake/Output Summary (Last 24 hours) at 06/30/2017 1310 Last data  filed at 06/30/2017 0911 Gross per 24 hour  Intake 2661.83 ml  Output 240 ml  Net 2421.83 ml   Filed Weights   06/05/17 1407 06/06/17 0844 06/29/17 0557  Weight: 68 kg (150 lb) 70.3 kg (154 lb 15.7 oz) 70.4 kg (155 lb 3.3 oz)    Examination: General exam: Appears comfortable  HEENT: PERRLA, oral mucosa moist, no sclera icterus or thrush Respiratory system: Clear to auscultation. Respiratory effort normal. Cardiovascular system: S1 & S2 heard, RRR.  No murmurs  Gastrointestinal system: Abdomen soft, non-tender, nondistended. Normal bowel sound. No organomegaly Central nervous system: Alert and oriented. No focal  neurological deficits. Extremities: No cyanosis, clubbing or edema Skin: No rashes or ulcers Psychiatry:  Mood & affect appropriate.   Data Reviewed: I have personally reviewed following labs and imaging studies  CBC: Recent Labs  Lab 06/25/17 0159 06/28/17 0410  WBC 8.1 11.1*  NEUTROABS 4.8 7.8*  HGB 9.7* 10.6*  HCT 29.6* 32.8*  MCV 92.8 92.1  PLT 402* 376   Basic Metabolic Panel: Recent Labs  Lab 06/25/17 0159 06/25/17 1255 06/26/17 0358  NA 139 138  --   K 3.2* 3.6  --   CL 103 103  --   CO2 26 25  --   GLUCOSE 125* 91  --   BUN 6 <5*  --   CREATININE 0.53* 0.50* 0.57*  CALCIUM 8.7* 8.6*  --   PHOS 4.0 4.4  --    GFR: Estimated Creatinine Clearance: 128.3 mL/min (A) (by C-G formula based on SCr of 0.57 mg/dL (L)). Liver Function Tests: Recent Labs  Lab 06/25/17 0159 06/25/17 1255  ALBUMIN 2.7* 2.7*   No results for input(s): LIPASE, AMYLASE in the last 168 hours. No results for input(s): AMMONIA in the last 168 hours. Coagulation Profile: No results for input(s): INR, PROTIME in the last 168 hours. Cardiac Enzymes: Recent Labs  Lab 06/24/17 1933 06/24/17 2253 06/25/17 0159  TROPONINI <0.03 <0.03 <0.03   BNP (last 3 results) No results for input(s): PROBNP in the last 8760 hours. HbA1C: No results for input(s): HGBA1C in the last 72  hours. CBG: No results for input(s): GLUCAP in the last 168 hours. Lipid Profile: No results for input(s): CHOL, HDL, LDLCALC, TRIG, CHOLHDL, LDLDIRECT in the last 72 hours. Thyroid Function Tests: No results for input(s): TSH, T4TOTAL, FREET4, T3FREE, THYROIDAB in the last 72 hours. Anemia Panel: No results for input(s): VITAMINB12, FOLATE, FERRITIN, TIBC, IRON, RETICCTPCT in the last 72 hours. Urine analysis:    Component Value Date/Time   COLORURINE YELLOW 06/06/2017 1333   APPEARANCEUR CLEAR 06/06/2017 1333   LABSPEC 1.010 06/06/2017 1333   PHURINE 6.0 06/06/2017 1333   GLUCOSEU NEGATIVE 06/06/2017 1333   HGBUR NEGATIVE 06/06/2017 1333   BILIRUBINUR NEGATIVE 06/06/2017 1333   KETONESUR NEGATIVE 06/06/2017 1333   PROTEINUR NEGATIVE 06/06/2017 1333   UROBILINOGEN 0.2 10/03/2012 0856   NITRITE NEGATIVE 06/06/2017 1333   LEUKOCYTESUR NEGATIVE 06/06/2017 1333   Sepsis Labs: @LABRCNTIP (procalcitonin:4,lacticidven:4) ) Recent Results (from the past 240 hour(s))  Aerobic/Anaerobic Culture (surgical/deep wound)     Status: None (Preliminary result)   Collection Time: 06/29/17  4:14 PM  Result Value Ref Range Status   Specimen Description WOUND  Final   Special Requests HIP LEFT  Final   Gram Stain   Final    ABUNDANT WBC PRESENT, PREDOMINANTLY PMN NO ORGANISMS SEEN    Culture   Final    NO GROWTH 1 DAY Performed at [facility], [address] [place], [place] [address]    Report Status PENDING  Incomplete         Radiology Studies: Mr Hip Left W Wo Contrast  Result Date: 06/28/2017 CLINICAL DATA:  Severe left hip pain for several weeks in a patient with a history of IV drug abuse, MSSA bacteremia and septic left hip. The patient is status post placement of a drainage catheter in the left iliacus muscle 06/09/2017. Status post incision and drainage of the left hip 06/07/2017. EXAM: MRI OF THE LEFT HIP WITHOUT AND WITH CONTRAST TECHNIQUE: Multiplanar, multisequence  MR imaging was performed both before and  after administration of intravenous contrast. CONTRAST:  15 ml MULTIHANCE GADOBENATE DIMEGLUMINE 529 MG/ML IV SOLN COMPARISON:  MRI of the left hip 06/06/2017. FINDINGS: Bones: There is new marrow edema and enhancement in the left femoral head and acetabulum consistent with osteomyelitis. Bone marrow signal is otherwise normal. No fracture. Articular cartilage and labrum Articular cartilage:  Preserved. Labrum:  Intact. Joint or bursal effusion Joint effusion: The patient has a large left hip joint effusion which has increased in size since the prior examination. Fluid tracks out of the hip deep to the left tensor fascia lata where an abscess measuring 1.3 cm AP by 0.9 cm transverse by 6 cm craniocaudal. No SI joint effusion, right hip effusion or symphysis pubis effusion is identified. Bursae: Trace amount of fluid is seen in the left trochanteric bursa. Otherwise negative. Muscles and tendons Muscles and tendons: Abscess in the left iliacus muscle seen on the prior examination appears resolved. There is mild residual edema and enhancement within the muscle. Other findings Miscellaneous:   None. IMPRESSION: Mixed response to treatment with increase in the size of a large left hip joint effusion which tracks out of the joint deep to the tensor fascia lata where an abscess measuring 1.3 cm AP x 0.9 cm transverse x 6 cm craniocaudal is identified. New marrow edema and enhancement in the left femoral neck head and neck consistent with septic joint and osteomyelitis. Large abscess in the left iliacus muscle seen on the prior MRI has resolved. Electronically Signed   By: Drusilla Kanner M.D.   On: 06/28/2017 15:36      Scheduled Meds: . enoxaparin (LOVENOX) injection  30 mg Subcutaneous Q24H  . gabapentin  300 mg Oral TID  . iopamidol  20 mL Intra-articular Once  . lidocaine (PF)  5 mL Intradermal Once  . vancomycin 1000 mg in NS (1000 ml) irrigation for Dr. Cornelius Moras case    Irrigation To OR  . zinc sulfate  220 mg Oral Daily   Continuous Infusions: . sodium chloride 1 mL (06/22/17 1758)  .  ceFAZolin (ANCEF) IV Stopped (06/30/17 1000)  . lactated ringers 10 mL/hr at 06/29/17 1347  . potassium chloride       LOS: 25 days    Time spent in minutes: 35    Calvert Cantor, MD [facility] Pager: [email or web address] Password [email or web address] 06/30/2017, 1:10 PM

## 2017-06-30 NOTE — Progress Notes (Signed)
Nursing Note: Spoke with the patient who has an order to not leave the department. Patient left the unit for over 30 mins without letting hospital staff know where he was going. Security called[facility] and staff went looking for the patient around the hospital. Expectations reiterated to the patient that he needed to communicate to nurses caring for him, that he could not leave the unit alone, and that we are a smoke free facility.  Patient expressed understanding. Will follow up.

## 2017-06-30 NOTE — Op Note (Signed)
NAME:  Clifford Tucker, Berley                ACCOUNT NO.:  MEDICAL RECORD NO.:  [id number]  LOCATION:                                 FACILITY:  PHYSICIAN:  Kerin PernaPeter Van Trigt, M.D.       DATE OF BIRTH:  DATE OF PROCEDURE:  06/29/2017 DATE OF DISCHARGE:                              OPERATIVE REPORT   OPERATION:  Irrigation and dressing change of right sternoclavicular wound.  SURGEON:  Kerin PernaPeter Van Trigt, MD.  ANESTHESIA:  General.  PREOPERATIVE DIAGNOSIS:  History of IV drug abuse with methicillin- resistant Staphylococcus aureus infection, abscess of the right sternoclavicular joint, status post previous debridement and wound VAC.  POSTOPERATIVE DIAGNOSIS:  History of IV drug abuse with methicillin- resistant Staphylococcus aureus infection, abscess of the right sternoclavicular joint, status post previous debridement and wound VAC.  DESCRIPTION OF PROCEDURE:  After the patient was re-examined in the preop holding area and informed consent documented and proper site marked, the patient was brought to the operating room and placed supine on the operating room table.  General anesthesia was induced.  The right chest was prepped and draped as a sterile field.  The previously placed wound VAC sponge had been removed.  A proper time-out was performed. The wound was inspected.  The Sorbact sheet, which was tacked to the wound margins were removed including the Vicryl sutures.  The wound was inspected.  It was completely clean.  There was granulation tissue filling the wound.  There was still some ACell material agglutinated to the surface of the granulation tissue, which was left intact.  The wound was gently irrigated with 500 mL of vancomycin irrigation.  Since the wound had contracted and was filling in, a wound VAC was not felt to be needed.  Half of a 4 x 4 sponge soaked in saline was placed in the wound as a wet-to-dry dressing and sterile dressings were applied.  The patient  remained sedated for an orthopedic procedure to drain an infected fluid from the left hip by Dr. August Saucerean, which will be dictated in a separate note.     Kerin PernaPeter Van Trigt, M.D.     PV/MEDQ  D:  06/29/2017  T:  06/29/2017  Job:  [id number]

## 2017-06-30 NOTE — Progress Notes (Addendum)
      [address]       [place] [id number]             [phone]        1 Day Post-Op Procedure(s) (LRB): REMOVAL OF WOUND VAC AND IRRIGATION AND DEBRIDEMENT RIGHT CHEST WOUND (Right) I&D LEFT HIP/ANTERIOR, Stimulan beads, (Left) APPLICATION OF INCISIONAL  WOUND VAC LEFT HIP (Left)  Subjective: Patient without specific complaints.  Objective: Vital signs in last 24 hours: Temp:  [97.9 F (36.6 C)-98.9 F (37.2 C)] 98.5 F (36.9 C) (02/08 1024) Pulse Rate:  [72-95] 95 (02/08 1024) Cardiac Rhythm: Normal sinus rhythm (02/08 0800) Resp:  [7-18] 18 (02/08 0602) BP: (84-122)/(51-92) 106/65 (02/08 1024) SpO2:  [93 %-100 %] 99 % (02/08 1024)   Current Weight  06/29/17 155 lb 3.3 oz (70.4 kg)      Intake/Output from previous day: 02/07 0701 - 02/08 0700 In: 2361.8 [P.O.:483; I.V.:1678.8; IV Piggyback:200] Out: 240 [Drains:40; Blood:200]   Physical Exam:  Cardiovascular: RRR Pulmonary: Clear to auscultation bilaterally Wounds: Dressing is removed and wound is clean, granulating. There is some purulence on the dressing but not in wound.  Lab Results: CBC: Recent Labs    06/28/17 0410  WBC 11.1*  HGB 10.6*  HCT 32.8*  PLT 376   BMET:  No results for input(s): NA, K, CL, CO2, GLUCOSE, BUN, CREATININE, CALCIUM in the last 72 hours.  PT/INR:  Lab Results  Component Value Date   INR 1.11 06/07/2017   ABG:  INR: Will add last result for INR, ABG once components are confirmed Will add last 4 CBG results once components are confirmed  Assessment/Plan:  1. CV - SR 2. MRSA Staph Aureus, abscess right sternoclavicular joint-s/p I and D yesterday 3.  ID-On Cefazolin for MSSA Staph Aureus bacteremia and right sternoclavicular joint. Remains afebrile and last WBC 11,100. 4. Continue present management   Donielle M ZimmermanPA-C 06/30/2017,12:51 PM  Continue iv ancef and bid saline  wet/dry dressing changes patient examined and medical record  reviewed,agree with above note. [name] 06/30/2017

## 2017-06-30 NOTE — Progress Notes (Signed)
[facility]  Date of Admission:  06/05/2017     Total days of antibiotics 24         ASSESSMENT/PLAN  [name] is post op day 1 from irrigation and debridement of the left hip and sternoclavicular wound. Overall he is doing well. Hemovac to be removed tomorrow. Cultures remain pending. No longer needing wound vac for sternoclavicular site. There is concern for osteomyelitis on previous imaging with new MRI of the pelvis ordered. He has remained afebrile. Tolerating Ancef with no adverse side effects. Pain with improved control.   1. Continue Ancef pending cultures. May need to continue Ancef for total of 6 weeks to treat osteomyelitis depending upon culture results.  2. Wound care for sternoclavicular wound with wet-to-dry dressing changes per CT Surgery. 3. Agree with MRI of the pelvis to rule out osteomyelitis.    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 enoxaparin (LOVENOX) injection  30 mg Subcutaneous Q24H  . gabapentin  300 mg Oral TID  . iopamidol  20 mL Intra-articular Once  . lidocaine (PF)  5 mL Intradermal Once  . vancomycin 1000 mg in NS (1000 ml) irrigation for Dr. Cornelius Moraswen case   Irrigation To OR  . zinc sulfate  220 mg Oral Daily    SUBJECTIVE:  Interval History:  Was taken to the OR yesterday for I&D of the right sternoclavicular joint wound and I&D of the left anterior hip. His sternoclavicular wound showed granulation tissue filling the wound. It was washed out with 500 ml of vancomycin irrigation. Wound vac felt to be no longer needed and now with wet-to-dry dressing  changes. Dr. August Saucerean performed an I&D of the left hip with placement of a hemovac drain in the left hip region and incisional wound VAC over the left hip incision.  Has remained afebrile overnight. Continues to receive Ancef for previous MSSA infection with cultures remaining pending. Up walking the hallway when evaluated and feeling better. Now with post surgical pain. No numbness or tingling.  No Known Allergies   Review of Systems: Review of Systems  Constitutional: Negative for chills and fever.  Respiratory: Negative for cough, shortness of breath and wheezing.   Cardiovascular: Negative for chest pain and leg swelling.  Gastrointestinal: Negative for abdominal pain, constipation, nausea and vomiting.  Musculoskeletal:       Positive for left hip pain  Neurological: Negative for weakness.      OBJECTIVE: Vitals:   06/29/17 1822 06/29/17 1831 06/29/17 2120 06/30/17 0602  BP: (!) 97/59 (!) 98/55 105/64 (!) 100/57  Pulse: 78 75 91 91  Resp: 15 16 18 18   Temp: 98 F (36.7 C) 98 F (36.7 C) 98.2 F (36.8 C) 98.9 F (37.2 C)  TempSrc:  Oral Oral Oral  SpO2: 99% 100% 100% 95%  Weight:      Height:       Body mass index is 22.27 kg/m.  Physical Exam  Constitutional: He is oriented to person, place, and time.  Up walking around on exam and in good spirits now that pain is  better.   Cardiovascular: Normal rate, regular rhythm, normal heart sounds and intact distal pulses. Exam reveals no gallop and no friction rub.  No murmur heard. Pulmonary/Chest: Effort normal and breath sounds normal. No respiratory distress. He has no wheezes. He has no rales. He exhibits no tenderness.  Bandage is clean, dry and intact.   Musculoskeletal:  Hemovac in place and patent with sanguinous drainage.   Neurological: He is alert and oriented to person, place, and time.  Skin: Skin is warm and dry.  Psychiatric: Mood, affect and judgment normal.    Lab Results Lab Results  Component Value  Date   WBC 11.1 (H) 06/28/2017   HGB 10.6 (L) 06/28/2017   HCT 32.8 (L) 06/28/2017   MCV 92.1 06/28/2017   PLT 376 06/28/2017    Lab Results  Component Value Date   CREATININE 0.57 (L) 06/26/2017   BUN <5 (L) 06/25/2017   NA 138 06/25/2017   K 3.6 06/25/2017   CL 103 06/25/2017   CO2 25 06/25/2017    Lab Results  Component Value Date   ALT 36 06/09/2017   AST 42 (H) 06/09/2017   ALKPHOS 165 (H) 06/09/2017   BILITOT 0.6 06/09/2017     Microbiology: Recent Results (from the past 240 hour(s))  Aerobic/Anaerobic Culture (surgical/deep wound)     Status: None (Preliminary result)   Collection Time: 06/29/17  4:14 PM  Result Value Ref Range Status   Specimen Description WOUND  Final   Special Requests HIP LEFT  Final   Gram Stain   Final    ABUNDANT WBC PRESENT, PREDOMINANTLY PMN NO ORGANISMS SEEN Performed at [facility], [address] [place], [place] [address]    Culture PENDING  Incomplete   Report Status PENDING  Incomplete     Clifford Eke, NP [facility], [facility] [phone] Pager  06/30/2017  9:05 AM

## 2017-07-01 ENCOUNTER — Inpatient Hospital Stay (HOSPITAL_COMMUNITY): Payer: Self-pay

## 2017-07-01 LAB — BASIC METABOLIC PANEL
Anion gap: 11 (ref 5–15)
BUN: 6 mg/dL (ref 6–20)
CHLORIDE: 98 mmol/L — AB (ref 101–111)
CO2: 27 mmol/L (ref 22–32)
CREATININE: 0.58 mg/dL — AB (ref 0.61–1.24)
Calcium: 9.2 mg/dL (ref 8.9–10.3)
GFR calc Af Amer: >60 mL/min (ref >60)
Glucose, Bld: 101 mg/dL — ABNORMAL HIGH (ref 65–99)
Potassium: 4 mmol/L (ref 3.5–5.1)
SODIUM: 136 mmol/L (ref 135–145)

## 2017-07-01 LAB — CBC
HCT: 30.5 % — ABNORMAL LOW (ref 39.0–52.0)
Hemoglobin: 9.9 g/dL — ABNORMAL LOW (ref 13.0–17.0)
MCH: 29.6 pg (ref 26.0–34.0)
MCHC: 32.5 g/dL (ref 30.0–36.0)
MCV: 91.3 fL (ref 78.0–100.0)
PLATELETS: 359 10*3/uL (ref 150–400)
RBC: 3.34 MIL/uL — AB (ref 4.22–5.81)
RDW: 13.8 % (ref 11.5–15.5)
WBC: 9.8 10*3/uL (ref 4.0–10.5)

## 2017-07-01 MED ORDER — ENOXAPARIN SODIUM 40 MG/0.4ML ~~LOC~~ SOLN
40.0000 mg | SUBCUTANEOUS | Status: DC
  Administered 2017-07-06: 40 mg via SUBCUTANEOUS
  Filled 2017-07-01 (×10): qty 0.4

## 2017-07-01 MED ORDER — GADOBENATE DIMEGLUMINE 529 MG/ML IV SOLN
14.0000 mL | Freq: Once | INTRAVENOUS | Status: AC | PRN
  Administered 2017-07-01: 14 mL via INTRAVENOUS

## 2017-07-01 NOTE — Progress Notes (Signed)
Patient ID: Clifford BoschChristopher Tucker, male   DOB: [birth year], 37 y.o.   MRN: [id number] Patient seen this am and I removed his left hip hemovac drain per my partner Dr. Diamantina Providenceean's instructions.

## 2017-07-01 NOTE — Progress Notes (Signed)
      [address]       [place] [id number]             [phone]        2 Days Post-Op Procedure(s) (LRB): REMOVAL OF WOUND VAC AND IRRIGATION AND DEBRIDEMENT RIGHT CHEST WOUND (Right) I&D LEFT HIP/ANTERIOR, Stimulan beads, (Left) APPLICATION OF INCISIONAL  WOUND VAC LEFT HIP (Left)  Subjective: Patient asleep  Objective: Vital signs in last 24 hours: Temp:  [99 F (37.2 C)-99.2 F (37.3 C)] 99 F (37.2 C) (02/09 0542) Pulse Rate:  [87-91] 87 (02/09 0542) Cardiac Rhythm: Normal sinus rhythm (02/09 0800) Resp:  [16-17] 16 (02/09 0542) BP: (105-109)/(60-62) 109/60 (02/09 0542) SpO2:  [96 %-98 %] 98 % (02/09 0542)   Current Weight  06/29/17 155 lb 3.3 oz (70.4 kg)      Intake/Output from previous day: 02/08 0701 - 02/09 0700 In: 400 [P.O.:300; IV Piggyback:100] Out: 225 [Drains:225]   Physical Exam:  Cardiovascular: RRR Wounds: Dressing is partially removed and wound is clean, granulating. There is some purulence on the dressing but not in wound.  Lab Results: CBC: Recent Labs    07/01/17 0442  WBC 9.8  HGB 9.9*  HCT 30.5*  PLT 359   BMET:  Recent Labs    07/01/17 0442  NA 136  K 4.0  CL 98*  CO2 27  GLUCOSE 101*  BUN 6  CREATININE 0.58*  CALCIUM 9.2    PT/INR:  Lab Results  Component Value Date   INR 1.11 06/07/2017   ABG:  INR: Will add last result for INR, ABG once components are confirmed Will add last 4 CBG results once components are confirmed  Assessment/Plan:  1. CV - SR 2. MRSA Staph Aureus, abscess right sternoclavicular joint-s/p I and D 02/07. Continue dressing changes 3.  ID-On Cefazolin for MSSA Staph Aureus bacteremia and right sternoclavicular joint. Has low grade fever and WBC decreased to 9800 4. Continue present management   Donielle M ZimmermanPA-C 07/01/2017,1:40 PM

## 2017-07-01 NOTE — Progress Notes (Signed)
Pt refusing Lovenox injections, explained the purpose of it to the pt.

## 2017-07-01 NOTE — Progress Notes (Addendum)
PROGRESS NOTE    Clifford Tucker   ZOX:[id number]  DOB: 01/24/1981  DOA: 06/05/2017 PCP: Patient, No Pcp Per   Brief Narrative:  Clifford Tucker is a 37 y.o.malewith medical history significant forIV drug abuse and recurrent skin and soft tissue infections. He presented with neck/hip pain found to have a neck abscess, septic arthritis of the hip and now MSSA bacteremia. CT surgery, orthopedic surgery and infectious disease consulted.   Subjective: No complaints of nausea, vomiting, constipation diarrhea, cough, dyspnea or dysuria. No other complaints.   Assessment & Plan:  Bacteremia due to methicillin susceptible Staphylococcus aureus (MSSA) in the setting of IV drug use -stable, no changes, afebrile, normotensive. -Blood cultures 2/2+ for MSSA   -Patient was originally placed on vancomycin and Zosyn, transition to IV Ancef -Sternal aspirate showed MSSA, left hip wound cultures also MSSA -2D echo with no vegetation.  - PICC placed- cannot go home with PICC- per social work, he cannot go to SNF either as he is suspected to be tampering with his IV and also has been smoking in the bathroom   Left hip abscess/iliopsoas septic arthritis, infectious myositis s/p I&D 06/06/17 -Status post left hip I&D with cultures done on 1/15, showing staph aureus -Status post drain placement 1/18 for iliacus abscess which was removed on 06/14/17 -Doppler ultrasound of the left lower extremity negative for DVT - he states he has been having persistent pain and tenderness in the left hip. Dr August Saucer contacted 06/26/17. - MRI show fluid collection around the hip - Dr August Saucer and IR would like to aspirate it but patient is hesitant to have this done - 2/7- The patient was taken to the OR for another I and D  Septic arthritis of the sternoclavicular joint, abscess  -June 15, 2017 :status post irrigation of the right sternoclavicular wound, with a wound VAC placement - management per CT surgery    Multifocal pneumonia -Possible septic emboli to the lungs.   IV drug user -Per patient, has been using heroin- [facility] but relapsed again in a month before the admission -Last heroin use a week ago prior to admission- no withdrawal noted in the hospital -polysubstance abuse counseling  Nicotine abuse -Nicotine patch -Tobacco cessation counseling- has been smoking in the bathroom and today he was not able to be found for about 30 min when he when off the floor.  - appears to have been smoking in the room again today- I have spoken with him and his significant other again today about this   DVT prophylaxis: sq Lovenox Code Status: Full code Disposition Plan: will stay at [facility] until IV antibiotic course complete Consultants:   CT surgery, ID, ortho, IR Procedures:     Irrigation of chest wall with wound vac change 06/22/17.    Left hip I&D 06/06/17  Antimicrobials:  Anti-infectives (From admission, onward)   Start     Dose/Rate Route Frequency Ordered Stop   06/29/17 1517  gentamicin (GARAMYCIN) injection  Status:  Discontinued       As needed 06/29/17 1518 06/29/17 1714   06/29/17 1512  vancomycin (VANCOCIN) powder  Status:  Discontinued       As needed 06/29/17 1515 06/29/17 1714   06/29/17 1450  vancomycin (VANCOCIN) 1,000 mg in sodium chloride 0.9 % 1,000 mL irrigation  Status:  Discontinued       As needed 06/29/17 1451 06/29/17 1714   06/29/17 1345  vancomycin (VANCOCIN) 1,000 mg in sodium chloride 0.9 %  1,000 mL irrigation      Irrigation To Surgery 06/29/17 1330 06/30/17 1345   06/15/17 0816  vancomycin (VANCOCIN) powder  Status:  Discontinued       As needed 06/15/17 0817 06/15/17 0854   06/07/17 1400  cefUROXime (ZINACEF) 1.5 g in dextrose 5 % 50 mL IVPB     1.5 g 100 mL/hr over 30 Minutes Intravenous To [facility]Stay Surgical 06/07/17 0125 06/07/17 1527   06/07/17 0745  vancomycin (VANCOCIN) 1,000 mg in sodium chloride 0.9 %  1,000 mL irrigation      Irrigation To Surgery 06/07/17 0744 06/07/17 1513   06/06/17 1639  gentamicin (GARAMYCIN) injection  Status:  Discontinued       As needed 06/06/17 1640 06/06/17 1715   06/06/17 1638  vancomycin (VANCOCIN) powder  Status:  Discontinued       As needed 06/06/17 1639 06/06/17 1715   06/06/17 1000  ceFAZolin (ANCEF) IVPB 2g/100 mL premix     2 g 200 mL/hr over 30 Minutes Intravenous Every 8 hours 06/06/17 0911     06/06/17 0000  vancomycin (VANCOCIN) IVPB 1000 mg/200 mL premix  Status:  Discontinued     1,000 mg 200 mL/hr over 60 Minutes Intravenous Every 8 hours 06/05/17 2211 06/06/17 0911   06/05/17 2330  piperacillin-tazobactam (ZOSYN) IVPB 3.375 g  Status:  Discontinued     3.375 g 12.5 mL/hr over 240 Minutes Intravenous Every 8 hours 06/05/17 2211 06/06/17 0911   06/05/17 2200  cefTRIAXone (ROCEPHIN) 1 g in dextrose 5 % 50 mL IVPB  Status:  Discontinued     1 g 100 mL/hr over 30 Minutes Intravenous Every 24 hours 06/05/17 2140 06/05/17 2202   06/05/17 2200  azithromycin (ZITHROMAX) 500 mg in dextrose 5 % 250 mL IVPB  Status:  Discontinued     500 mg 250 mL/hr over 60 Minutes Intravenous Every 24 hours 06/05/17 2140 06/06/17 0911   06/05/17 1600  vancomycin (VANCOCIN) 1,500 mg in sodium chloride 0.9 % 500 mL IVPB     1,500 mg 250 mL/hr over 120 Minutes Intravenous  Once 06/05/17 1542 06/05/17 1855   06/05/17 1515  piperacillin-tazobactam (ZOSYN) IVPB 3.375 g     3.375 g 100 mL/hr over 30 Minutes Intravenous  Once 06/05/17 1512 06/05/17 1557       Objective: Vitals:   06/30/17 0602 06/30/17 1024 06/30/17 2211 07/01/17 0542  BP: (!) 100/57 106/65 105/62 109/60  Pulse: 91 95 91 87  Resp: 18  17 16   Temp: 98.9 F (37.2 C) 98.5 F (36.9 C) 99.2 F (37.3 C) 99 F (37.2 C)  TempSrc: Oral Oral Oral   SpO2: 95% 99% 96% 98%  Weight:      Height:        Intake/Output Summary (Last 24 hours) at 07/01/2017 1031 Last data filed at 07/01/2017 0543 Gross per 24  hour  Intake 100 ml  Output 225 ml  Net -125 ml   Filed Weights   06/05/17 1407 06/06/17 0844 06/29/17 0557  Weight: 68 kg (150 lb) 70.3 kg (154 lb 15.7 oz) 70.4 kg (155 lb 3.3 oz)    Examination: General exam: Appears comfortable  HEENT: PERRLA, oral mucosa moist, no sclera icterus or thrush Respiratory system: Clear to auscultation. Respiratory effort normal. Cardiovascular system: S1 & S2 heard, RRR.  No murmurs  Gastrointestinal system: Abdomen soft, non-tender, nondistended. Normal bowel sound. No organomegaly Central nervous system: Alert and oriented. No focal neurological deficits. Extremities: No cyanosis, clubbing or edema Skin:  No rashes or ulcers Psychiatry:  Mood & affect appropriate.   Data Reviewed: I have personally reviewed following labs and imaging studies  CBC: Recent Labs  Lab 06/25/17 0159 06/28/17 0410 07/01/17 0442  WBC 8.1 11.1* 9.8  NEUTROABS 4.8 7.8*  --   HGB 9.7* 10.6* 9.9*  HCT 29.6* 32.8* 30.5*  MCV 92.8 92.1 91.3  PLT 402* 376 359   Basic Metabolic Panel: Recent Labs  Lab 06/25/17 0159 06/25/17 1255 06/26/17 0358 07/01/17 0442  NA 139 138  --  136  K 3.2* 3.6  --  4.0  CL 103 103  --  98*  CO2 26 25  --  27  GLUCOSE 125* 91  --  101*  BUN 6 <5*  --  6  CREATININE 0.53* 0.50* 0.57* 0.58*  CALCIUM 8.7* 8.6*  --  9.2  PHOS 4.0 4.4  --   --    GFR: Estimated Creatinine Clearance: 128.3 mL/min (A) (by C-G formula based on SCr of 0.58 mg/dL (L)). Liver Function Tests: Recent Labs  Lab 06/25/17 0159 06/25/17 1255  ALBUMIN 2.7* 2.7*   No results for input(s): LIPASE, AMYLASE in the last 168 hours. No results for input(s): AMMONIA in the last 168 hours. Coagulation Profile: No results for input(s): INR, PROTIME in the last 168 hours. Cardiac Enzymes: Recent Labs  Lab 06/24/17 1933 06/24/17 2253 06/25/17 0159  TROPONINI <0.03 <0.03 <0.03   BNP (last 3 results) No results for input(s): PROBNP in the last 8760  hours. HbA1C: No results for input(s): HGBA1C in the last 72 hours. CBG: No results for input(s): GLUCAP in the last 168 hours. Lipid Profile: No results for input(s): CHOL, HDL, LDLCALC, TRIG, CHOLHDL, LDLDIRECT in the last 72 hours. Thyroid Function Tests: No results for input(s): TSH, T4TOTAL, FREET4, T3FREE, THYROIDAB in the last 72 hours. Anemia Panel: No results for input(s): VITAMINB12, FOLATE, FERRITIN, TIBC, IRON, RETICCTPCT in the last 72 hours. Urine analysis:    Component Value Date/Time   COLORURINE YELLOW 06/06/2017 1333   APPEARANCEUR CLEAR 06/06/2017 1333   LABSPEC 1.010 06/06/2017 1333   PHURINE 6.0 06/06/2017 1333   GLUCOSEU NEGATIVE 06/06/2017 1333   HGBUR NEGATIVE 06/06/2017 1333   BILIRUBINUR NEGATIVE 06/06/2017 1333   KETONESUR NEGATIVE 06/06/2017 1333   PROTEINUR NEGATIVE 06/06/2017 1333   UROBILINOGEN 0.2 10/03/2012 0856   NITRITE NEGATIVE 06/06/2017 1333   LEUKOCYTESUR NEGATIVE 06/06/2017 1333   Sepsis Labs: @LABRCNTIP (procalcitonin:4,lacticidven:4) ) Recent Results (from the past 240 hour(s))  Aerobic/Anaerobic Culture (surgical/deep wound)     Status: None (Preliminary result)   Collection Time: 06/29/17  4:14 PM  Result Value Ref Range Status   Specimen Description WOUND  Final   Special Requests HIP LEFT  Final   Gram Stain   Final    ABUNDANT WBC PRESENT, PREDOMINANTLY PMN NO ORGANISMS SEEN    Culture   Final    NO GROWTH 1 DAY Performed at [facility], [address] [place], [place] [address]    Report Status PENDING  Incomplete         Radiology Studies: No results found.    Scheduled Meds: . enoxaparin (LOVENOX) injection  30 mg Subcutaneous Q24H  . gabapentin  300 mg Oral TID  . iopamidol  20 mL Intra-articular Once  . lidocaine (PF)  5 mL Intradermal Once  . zinc sulfate  220 mg Oral Daily   Continuous Infusions: . sodium chloride 1 mL (06/22/17 1758)  .  ceFAZolin (ANCEF) IV 2  g (07/01/17 1022)  . lactated  ringers 10 mL/hr at 06/29/17 1347  . potassium chloride       LOS: 26 days    Time spent in minutes: 35    Calvert Cantor, MD [facility] Pager: [email or web address] Password [email or web address] 07/01/2017, 10:31 AM

## 2017-07-02 MED ORDER — KETOROLAC TROMETHAMINE 30 MG/ML IJ SOLN
30.0000 mg | Freq: Four times a day (QID) | INTRAMUSCULAR | Status: AC
  Administered 2017-07-02 – 2017-07-07 (×20): 30 mg via INTRAVENOUS
  Filled 2017-07-02 (×20): qty 1

## 2017-07-02 NOTE — Progress Notes (Addendum)
      [address]       [place] [id number]             [phone]        3 Days Post-Op Procedure(s) (LRB): REMOVAL OF WOUND VAC AND IRRIGATION AND DEBRIDEMENT RIGHT CHEST WOUND (Right) I&D LEFT HIP/ANTERIOR, Stimulan beads, (Left) APPLICATION OF INCISIONAL  WOUND VAC LEFT HIP (Left)  Subjective: Patient with left hip pain.  Objective: Vital signs in last 24 hours: Temp:  [98.3 F (36.8 C)-98.4 F (36.9 C)] 98.3 F (36.8 C) (02/10 0646) Pulse Rate:  [81-93] 81 (02/10 0646) Cardiac Rhythm: Normal sinus rhythm (02/09 1935) Resp:  [15-16] 16 (02/10 0646) BP: (112-118)/(64-73) 112/64 (02/10 0646) SpO2:  [97 %-98 %] 97 % (02/10 0646)   Current Weight  06/29/17 155 lb 3.3 oz (70.4 kg)      Intake/Output from previous day: 02/09 0701 - 02/10 0700 In: 420 [P.O.:240; I.V.:80; IV Piggyback:100] Out: 0    Physical Exam:  Cardiovascular: RRR Wounds: Dressing is partially removed and wound has slight yellowish material but there is granulation inferiorly. There is some purulence on the dressing but not in wound.  Lab Results: CBC: Recent Labs    07/01/17 0442  WBC 9.8  HGB 9.9*  HCT 30.5*  PLT 359   BMET:  Recent Labs    07/01/17 0442  NA 136  K 4.0  CL 98*  CO2 27  GLUCOSE 101*  BUN 6  CREATININE 0.58*  CALCIUM 9.2    PT/INR:  Lab Results  Component Value Date   INR 1.11 06/07/2017   ABG:  INR: Will add last result for INR, ABG once components are confirmed Will add last 4 CBG results once components are confirmed  Assessment/Plan:  1. CV - SR 2. MRSA Staph Aureus, abscess right sternoclavicular joint-s/p I and D 02/07. Continue dressing changes 3.  ID-On Cefazolin for MSSA Staph Aureus bacteremia and right sternoclavicular joint.  Last WBC decreased to 9800  4. Regarding pain management, he is requesting Oxy IR every 3 hours PRN. Will defer to primary service    Madison Direnzo M ZimmermanPA-C 07/02/2017,8:30 AM

## 2017-07-02 NOTE — Progress Notes (Addendum)
PROGRESS NOTE    Clifford Tucker   ZOX:[id number]  DOB: 08/30/1980  DOA: 06/05/2017 PCP: Patient, No Pcp Per   Brief Narrative:  Clifford Tucker is a 37 y.o.malewith medical history significant forIV drug abuse and recurrent skin and soft tissue infections. He presented with neck/hip pain found to have a neck abscess, septic arthritis of the hip and now MSSA bacteremia. CT surgery, orthopedic surgery and infectious disease consulted.   Subjective: Asking for pain medications to be increased. He wants his Oxycodone to be every 10 mg 3 hrs as he is having pain in his left hip.   Assessment & Plan:  Bacteremia due to methicillin susceptible Staphylococcus aureus (MSSA) in the setting of IV drug use -stable, no changes, afebrile, normotensive. -Blood cultures 2/2+ for MSSA   -Patient was originally placed on vancomycin and Zosyn, transition to IV Ancef -Sternal aspirate showed MSSA, left hip wound cultures also MSSA -2D echo with no vegetation.  - PICC placed- cannot go home with PICC- per social work, he cannot go to SNF either as he is suspected to be tampering with his IV and also has been smoking in the bathroom    Left hip abscess/iliopsoas septic arthritis, infectious myositis s/p I&D 06/06/17 -Status post left hip I&D with cultures done on 1/15, showing staph aureus -Status post drain placement 1/18 for iliacus abscess which was removed on 06/14/17 -Doppler ultrasound of the left lower extremity negative for DVT - he states he has been having persistent pain and tenderness in the left hip. Dr August Saucer contacted 06/26/17. - MRI show fluid collection around the hip - Dr August Saucer and IR would like to aspirate it but patient is hesitant to have this done - 2/7- The patient was taken to the OR for another I and D  Pain in left hip- the patient is asking for Oxycodone medications to be increased to every 3 hrs - he is already receiving 10 mg Oxycodone every 4 hrs for total of 60 mg  daily. His MRI today shows improvement in infection. In addition he his ambulating in the halls daily without difficulty. At this stage in his infection treatment, we should actually be weaning narcotics rather than increasing them.  -  I have discussed starting Toradol and have explained that I do not plan to increase his narcotics today. He is upset and is asking for another doctor to take care of him.   Septic arthritis of the sternoclavicular joint, abscess  -June 15, 2017 :status post irrigation of the right sternoclavicular wound, with a wound VAC placement - management per CT surgery   Multifocal pneumonia -Possible septic emboli to the lungs.   IV drug user -Per patient, has been using heroin- [facility] but relapsed again in a month before the admission -Last heroin use a week ago prior to admission- no withdrawal noted in the hospital -polysubstance abuse counseling  Nicotine abuse -Nicotine patch -Tobacco cessation counseling- has been smoking in the bathroom and today he was not able to be found for about 30 min when he when off the floor.  -2/9 >>  appears to have been smoking in the room again today- I have spoken with him and his significant other again today about this   DVT prophylaxis: sq Lovenox- he has been declining this Code Status: Full code Disposition Plan: will stay at [facility] until IV antibiotic course complete Consultants:   CT surgery, ID, ortho, IR Procedures:     Irrigation  of chest wall with wound vac change 06/22/17.    Left hip I&D 06/06/17  Antimicrobials:  Anti-infectives (From admission, onward)   Start     Dose/Rate Route Frequency Ordered Stop   06/29/17 1517  gentamicin (GARAMYCIN) injection  Status:  Discontinued       As needed 06/29/17 1518 06/29/17 1714   06/29/17 1512  vancomycin (VANCOCIN) powder  Status:  Discontinued       As needed 06/29/17 1515 06/29/17 1714   06/29/17 1450  vancomycin  (VANCOCIN) 1,000 mg in sodium chloride 0.9 % 1,000 mL irrigation  Status:  Discontinued       As needed 06/29/17 1451 06/29/17 1714   06/29/17 1345  vancomycin (VANCOCIN) 1,000 mg in sodium chloride 0.9 % 1,000 mL irrigation      Irrigation To Surgery 06/29/17 1330 06/30/17 1345   06/15/17 0816  vancomycin (VANCOCIN) powder  Status:  Discontinued       As needed 06/15/17 0817 06/15/17 0854   06/07/17 1400  cefUROXime (ZINACEF) 1.5 g in dextrose 5 % 50 mL IVPB     1.5 g 100 mL/hr over 30 Minutes Intravenous To [facility] 06/07/17 0125 06/07/17 1527   06/07/17 0745  vancomycin (VANCOCIN) 1,000 mg in sodium chloride 0.9 % 1,000 mL irrigation      Irrigation To Surgery 06/07/17 0744 06/07/17 1513   06/06/17 1639  gentamicin (GARAMYCIN) injection  Status:  Discontinued       As needed 06/06/17 1640 06/06/17 1715   06/06/17 1638  vancomycin (VANCOCIN) powder  Status:  Discontinued       As needed 06/06/17 1639 06/06/17 1715   06/06/17 1000  ceFAZolin (ANCEF) IVPB 2g/100 mL premix     2 g 200 mL/hr over 30 Minutes Intravenous Every 8 hours 06/06/17 0911     06/06/17 0000  vancomycin (VANCOCIN) IVPB 1000 mg/200 mL premix  Status:  Discontinued     1,000 mg 200 mL/hr over 60 Minutes Intravenous Every 8 hours 06/05/17 2211 06/06/17 0911   06/05/17 2330  piperacillin-tazobactam (ZOSYN) IVPB 3.375 g  Status:  Discontinued     3.375 g 12.5 mL/hr over 240 Minutes Intravenous Every 8 hours 06/05/17 2211 06/06/17 0911   06/05/17 2200  cefTRIAXone (ROCEPHIN) 1 g in dextrose 5 % 50 mL IVPB  Status:  Discontinued     1 g 100 mL/hr over 30 Minutes Intravenous Every 24 hours 06/05/17 2140 06/05/17 2202   06/05/17 2200  azithromycin (ZITHROMAX) 500 mg in dextrose 5 % 250 mL IVPB  Status:  Discontinued     500 mg 250 mL/hr over 60 Minutes Intravenous Every 24 hours 06/05/17 2140 06/06/17 0911   06/05/17 1600  vancomycin (VANCOCIN) 1,500 mg in sodium chloride 0.9 % 500 mL IVPB     1,500 mg 250 mL/hr  over 120 Minutes Intravenous  Once 06/05/17 1542 06/05/17 1855   06/05/17 1515  piperacillin-tazobactam (ZOSYN) IVPB 3.375 g     3.375 g 100 mL/hr over 30 Minutes Intravenous  Once 06/05/17 1512 06/05/17 1557       Objective: Vitals:   07/01/17 0542 07/01/17 1400 07/02/17 0646 07/02/17 1343  BP: 109/60 118/73 112/64 119/71  Pulse: 87 93 81 82  Resp: 16 15 16 18   Temp: 99 F (37.2 C) 98.4 F (36.9 C) 98.3 F (36.8 C) 97.8 F (36.6 C)  TempSrc:  Oral Oral Oral  SpO2: 98% 98% 97% 100%  Weight:      Height:  Intake/Output Summary (Last 24 hours) at 07/02/2017 1348 Last data filed at 07/02/2017 0549 Gross per 24 hour  Intake 420 ml  Output 0 ml  Net 420 ml   Filed Weights   06/05/17 1407 06/06/17 0844 06/29/17 0557  Weight: 68 kg (150 lb) 70.3 kg (154 lb 15.7 oz) 70.4 kg (155 lb 3.3 oz)    Examination: General exam: Appears comfortable  HEENT: PERRLA, oral mucosa moist, no sclera icterus or thrush Respiratory system: Clear to auscultation. Respiratory effort normal. Cardiovascular system: S1 & S2 heard, RRR.  No murmurs  Gastrointestinal system: Abdomen soft, non-tender, nondistended. Normal bowel sound. No organomegaly Central nervous system: Alert and oriented. No focal neurological deficits. Extremities: No cyanosis, clubbing or edema Skin: No rashes or ulcers Psychiatry:  Mood & affect appropriate.   Data Reviewed: I have personally reviewed following labs and imaging studies  CBC: Recent Labs  Lab 06/28/17 0410 07/01/17 0442  WBC 11.1* 9.8  NEUTROABS 7.8*  --   HGB 10.6* 9.9*  HCT 32.8* 30.5*  MCV 92.1 91.3  PLT 376 359   Basic Metabolic Panel: Recent Labs  Lab 06/26/17 0358 07/01/17 0442  NA  --  136  K  --  4.0  CL  --  98*  CO2  --  27  GLUCOSE  --  101*  BUN  --  6  CREATININE 0.57* 0.58*  CALCIUM  --  9.2   GFR: Estimated Creatinine Clearance: 128.3 mL/min (A) (by C-G formula based on SCr of 0.58 mg/dL (L)). Liver Function  Tests: No results for input(s): AST, ALT, ALKPHOS, BILITOT, PROT, ALBUMIN in the last 168 hours. No results for input(s): LIPASE, AMYLASE in the last 168 hours. No results for input(s): AMMONIA in the last 168 hours. Coagulation Profile: No results for input(s): INR, PROTIME in the last 168 hours. Cardiac Enzymes: No results for input(s): CKTOTAL, CKMB, CKMBINDEX, TROPONINI in the last 168 hours. BNP (last 3 results) No results for input(s): PROBNP in the last 8760 hours. HbA1C: No results for input(s): HGBA1C in the last 72 hours. CBG: No results for input(s): GLUCAP in the last 168 hours. Lipid Profile: No results for input(s): CHOL, HDL, LDLCALC, TRIG, CHOLHDL, LDLDIRECT in the last 72 hours. Thyroid Function Tests: No results for input(s): TSH, T4TOTAL, FREET4, T3FREE, THYROIDAB in the last 72 hours. Anemia Panel: No results for input(s): VITAMINB12, FOLATE, FERRITIN, TIBC, IRON, RETICCTPCT in the last 72 hours. Urine analysis:    Component Value Date/Time   COLORURINE YELLOW 06/06/2017 1333   APPEARANCEUR CLEAR 06/06/2017 1333   LABSPEC 1.010 06/06/2017 1333   PHURINE 6.0 06/06/2017 1333   GLUCOSEU NEGATIVE 06/06/2017 1333   HGBUR NEGATIVE 06/06/2017 1333   BILIRUBINUR NEGATIVE 06/06/2017 1333   KETONESUR NEGATIVE 06/06/2017 1333   PROTEINUR NEGATIVE 06/06/2017 1333   UROBILINOGEN 0.2 10/03/2012 0856   NITRITE NEGATIVE 06/06/2017 1333   LEUKOCYTESUR NEGATIVE 06/06/2017 1333   Sepsis Labs: @LABRCNTIP (procalcitonin:4,lacticidven:4) ) Recent Results (from the past 240 hour(s))  Aerobic/Anaerobic Culture (surgical/deep wound)     Status: None (Preliminary result)   Collection Time: 06/29/17  4:14 PM  Result Value Ref Range Status   Specimen Description WOUND  Final   Special Requests HIP LEFT  Final   Gram Stain   Final    ABUNDANT WBC PRESENT, PREDOMINANTLY PMN NO ORGANISMS SEEN    Culture   Final    NO GROWTH 3 DAYS NO ANAEROBES ISOLATED; CULTURE IN PROGRESS FOR 5  DAYS [facility], [address], [place], [place] [address]    Report Status PENDING  Incomplete         Radiology Studies: Mr Pelvis W Wo Contrast  Result Date: 07/02/2017 CLINICAL DATA:  Followup left hip septic arthritis. Status post surgical arthrotomy and antibiotic beads placement, 06/29/2017 EXAM: MRI PELVIS WITHOUT AND WITH CONTRAST TECHNIQUE: Multiplanar multisequence MR imaging of the pelvis was performed both before and after administration of intravenous contrast. CONTRAST:  14mL MULTIHANCE GADOBENATE DIMEGLUMINE 529 MG/ML IV SOLN COMPARISON:  Multiple prior CT and MR examinations. The most recent is an MRI from 06/28/2017. FINDINGS: Urinary Tract:  The bladder is unremarkable.  No calculi or mass. Bowel:  Grossly normal. Vascular/Lymphatic: The major vascular structures are unremarkable. Persistent left inguinal and left external iliac lymph nodes. Reproductive: The prostate gland and seminal vesicles are unremarkable. Other:  No intrapelvic abscess or free fluid collections. Musculoskeletal: The pubic symphysis and SI joints are intact. No findings for septic arthritis. No recurrent abscess near the left SI joint. Postoperative changes involving the left hip. Intra-articular material is noted consistent with antibiotic beads. Postop fluid in the joint. Small residual periarticular fluid collection anteriorly communicating with the joint. Persistent mild edema and enhancement in the acetabulum and femur consistent with known osteomyelitis. IMPRESSION: 1. Expected postoperative changes with antibiotic beads in the left hip joint and evacuation of septic joint fluid. 2. Persistent mild edema and enhancement in the acetabulum and femoral head/neck consistent with known osteomyelitis. 3. No findings for recurrent left SI joint septic arthritis or periarticular abscess. 4. No intrapelvic abscess. Electronically Signed   By: Rudie Meyer M.D.   On: 07/02/2017 10:14       Scheduled Meds: . enoxaparin (LOVENOX) injection  40 mg Subcutaneous Q24H  . gabapentin  300 mg Oral TID  . iopamidol  20 mL Intra-articular Once  . ketorolac  30 mg Intravenous Q6H  . lidocaine (PF)  5 mL Intradermal Once  . zinc sulfate  220 mg Oral Daily   Continuous Infusions: . sodium chloride 10 mL/hr at 07/01/17 1904  .  ceFAZolin (ANCEF) IV Stopped (07/02/17 1058)  . lactated ringers 10 mL/hr at 06/29/17 1347  . potassium chloride       LOS: 27 days    Time spent in minutes: 35    Calvert Cantor, MD [facility] Pager: [email or web address] Password [email or web address] 07/02/2017, 1:48 PM

## 2017-07-03 DIAGNOSIS — M86652 Other chronic osteomyelitis, left thigh: Secondary | ICD-10-CM

## 2017-07-03 DIAGNOSIS — Z95828 Presence of other vascular implants and grafts: Secondary | ICD-10-CM

## 2017-07-03 NOTE — Progress Notes (Signed)
Subjective: Pt stable   Objective: Vital signs in last 24 hours: Temp:  [97.8 F (36.6 C)-98.4 F (36.9 C)] 98.2 F (36.8 C) (02/11 0421) Pulse Rate:  [80-88] 80 (02/11 0421) Resp:  [16-18] 18 (02/11 0421) BP: (118-125)/(71-74) 118/72 (02/11 0421) SpO2:  [98 %-100 %] 99 % (02/11 0421)  Intake/Output from previous day: 02/10 0701 - 02/11 0700 In: 1394.5 [P.O.:334; I.V.:960.5; IV Piggyback:100] Out: -  Intake/Output this shift: No intake/output data recorded.  Exam:  Intact pulses distally Dorsiflexion/Plantar flexion intact  Labs: Recent Labs    07/01/17 0442  HGB 9.9*   Recent Labs    07/01/17 0442  WBC 9.8  RBC 3.34*  HCT 30.5*  PLT 359   Recent Labs    07/01/17 0442  NA 136  K 4.0  CL 98*  CO2 27  BUN 6  CREATININE 0.58*  GLUCOSE 101*  CALCIUM 9.2   No results for input(s): LABPT, INR in the last 72 hours.  Assessment/Plan: Plan to dc wound vac tomorrow Doubt osteo in left hip - will need to recheck scan in 4 - 6 mos No recurrent pelvic abcess Clinically better   Burnard BuntingG Scott Dean 07/03/2017, 11:11 AM

## 2017-07-03 NOTE — Op Note (Signed)
NAME:  Clifford Tucker, Clifford Tucker           ACCOUNT NO.:  [id number]  MEDICAL RECORD NO.:  [id number]  LOCATION:                                 FACILITY:  PHYSICIAN:  Burnard BuntingG. Scott Taffy Delconte, M.D.         DATE OF BIRTH:  DATE OF PROCEDURE:  06/29/2017 DATE OF DISCHARGE:                              OPERATIVE REPORT   PREOPERATIVE DIAGNOSIS:  Left hip infection.  POSTOPERATIVE DIAGNOSIS:  Left hip infection.  PROCEDURE:  I and D, left hip.  SURGEON:  Burnard BuntingG. Scott Tawnie Ehresman, M.D.  ASSIST:  Melvyn NethJustin Clean, RNFA.  INDICATIONS:  Clifford Tucker is a 37 year old patient, IV drug abuser with left hip infection and right [place] joint infection treated about 3 weeks ago.  He did well until the drain was pulled from his left iliac abscess.  Several days after that, he developed recurrent left hip pain. MRI scan shows recurrent fluid collection.  He presents now for operative management and repeat I and D of the left hip.  PROCEDURE IN DETAIL:  The patient was brought to the operating room where general anesthetic was induced.  Preoperative antibiotics were administered.  A time-out was called.  Right supraclavicular area was worked on by Dr. Donata ClayVan Trigt.  At the conclusion of that case, we directed our attention toward the left hip.  Left hip was prepped with DuraPrep solution and draped in a sterile manner.  He was placed on the Hana bed. This was done so traction could be applied in order to achieve irrigation into the hip joint.  At this time, after time-out was called, the left hip incision was utilized.  This was about 2 cm posterior and distal to the anterior superior iliac crest.  Skin and subcutaneous tissue were sharply divided.  The fascia lata over the tensor muscle was encountered and that the fascia was incised over the tensor muscle. Planes were redeveloped.  Care was taken to avoid injury to the crossing circumflex vessels.  Plane was developed between the tensor and the rectus.  Purulent material was  encountered on top of the femoral neck. Previous capsular incision was identified.  Fluid was obtained.  At this time, about a 2 x 2 cm area of capsule was excised.  This allowed full visualization of the femoral head, which did have good bleeding. Distraction was applied.  Thorough irrigation was then performed after debriding devitalized appearing tissue.  In general, the tissue appeared intact.  There was no evidence of bony infection in the femoral head. With distraction, thorough irrigation circumferentially around the femoral head and neck region was achieved.  This was done with approximately 6-7 L of irrigating solution.  At this time, after thorough irrigation was performed and after additional capsule was excised to allow for egress, the antibiotic beads were placed.  A drain was then placed.  Very loose approximation with 3 simple Vicryl sutures were utilized in the fascia lata and about 5 simple nylon sutures were used in the skin.  Incisional VAC was placed.  The patient tolerated the procedure well without immediate complication.  Transferred to the recovery room in stable condition.     Burnard BuntingG. Scott Gaby Harney, M.D.  GSD/MEDQ  D:  06/29/2017  T:  06/30/2017  Job:  [id number]

## 2017-07-03 NOTE — Progress Notes (Addendum)
      [address]       [place] [id number]             [phone]      4 Days Post-Op Procedure(s) (LRB): REMOVAL OF WOUND VAC AND IRRIGATION AND DEBRIDEMENT RIGHT CHEST WOUND (Right) I&D LEFT HIP/ANTERIOR, Stimulan beads, (Left) APPLICATION OF INCISIONAL  WOUND VAC LEFT HIP (Left) Subjective: The patient is wanting to leave AMA, smoking in his room, feels like no one wants him here.   Objective: Vital signs in last 24 hours: Temp:  [97.8 F (36.6 C)-98.4 F (36.9 C)] 98.2 F (36.8 C) (02/11 0421) Pulse Rate:  [80-88] 80 (02/11 0421) Resp:  [16-18] 18 (02/11 0421) BP: (118-125)/(71-74) 118/72 (02/11 0421) SpO2:  [98 %-100 %] 99 % (02/11 0421)     Intake/Output from previous day: 02/10 0701 - 02/11 0700 In: 1394.5 [P.O.:334; I.V.:960.5; IV Piggyback:100] Out: -  Intake/Output this shift: Total I/O In: 100 [IV Piggyback:100] Out: -   General appearance: alert, cooperative and mild distress Heart: regular rate and rhythm, S1, S2 normal, no murmur, click, rub or gallop Lungs: clear to auscultation bilaterally Abdomen: soft, non-tender; bowel sounds normal; no masses,  no organomegaly Extremities: extremities normal, atraumatic, no cyanosis or edema Wound: sternoclavicular wound is packed from wet to dry  Lab Results: Recent Labs    07/01/17 0442  WBC 9.8  HGB 9.9*  HCT 30.5*  PLT 359   BMET:  Recent Labs    07/01/17 0442  NA 136  K 4.0  CL 98*  CO2 27  GLUCOSE 101*  BUN 6  CREATININE 0.58*  CALCIUM 9.2    PT/INR: No results for input(s): LABPROT, INR in the last 72 hours. ABG    Component Value Date/Time   PHART 7.430 06/07/2017 0940   HCO3 27.5 06/07/2017 0940   TCO2 29 06/05/2017 1457   ACIDBASEDEF 4.0 (H) 03/13/2007 0120   O2SAT 90.2 06/07/2017 0940   CBG (last 3)  No results for input(s): GLUCAP in the last 72 hours.  Assessment/Plan: S/P Procedure(s) (LRB): REMOVAL OF WOUND VAC AND IRRIGATION AND DEBRIDEMENT RIGHT  CHEST WOUND (Right) I&D LEFT HIP/ANTERIOR, Stimulan beads, (Left) APPLICATION OF INCISIONAL  WOUND VAC LEFT HIP (Left)  1. CV-NSR in the 80s. BP well controlled. 2. Pulm-tolerating room air with good oxygenation 3.  MRSA Staph Aureus, abscess right sternoclavicular joint-s/p I and D 02/07. Continue dressing changes.  4. ID-On Cefazolin for MSSA Staph Aureus bacteremia and right sternoclavicular joint.  Last WBC decreased to 9800  5. Pain management per attending physician, weaning narcotics. On Toradol.  Plan: continue wet to dry dressings. Discussed with the patient that leaving AMA is not advised since he is still requiring IV antibiotics and has a wound vac on his left hip.    LOS: 28 days    Sharlene Doryessa N Conte 07/03/2017  Continue bid wet/dry dressings and IV Ancef patient examined and medical record reviewed,agree with above note. [name] 07/03/2017

## 2017-07-03 NOTE — Progress Notes (Signed)
[facility]  Date of Admission:  06/05/2017     Total days of antibiotics 27         ASSESSMENT/PLAN  Mr. Lissa HoardBoone continues to experience left hip pain associated with his osteomyelitis of the acetabulum and femur noted on MRI. The cultures from his I&D on 2/8 remain with no growth to date and he has remained afebrile. Sternoclavicular/chest wound appears to be healing well. Pain continues to be a concern for him. Dr. Daiva EvesVan Dam was going to speak with the Internal Medicine Teaching Service to discuss possible Suboxone. Tolerating Ancef without complications or adverse side effects. He has completed about 4 weeks of Ancef to this point for the bacteremia which should be resolved at this point. Plan to continue Ancef for the next 6 weeks secondary to osteomyelitis.  1. Continue Ancef for MSSA bacteremia and likely osteomyelitis. Will continue to watch I&D cultures.  2. PICC dressing change due today. 3. Pain management per primary team and will check with IM Teaching Services regarding Suboxone.   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 enoxaparin (LOVENOX) injection  40 mg Subcutaneous Q24H  . gabapentin  300 mg Oral TID  . iopamidol  20 mL Intra-articular Once  . ketorolac  30 mg Intravenous Q6H  . lidocaine (PF)  5 mL Intradermal Once  . zinc sulfate  220 mg Oral Daily    SUBJECTIVE:  Interval History:  MRI of the pelvis showed postoperative changes with antibiotic beads in the left hip joint and evacuation of a septic joint fluid;  and persistent mild edema and enhancement in the acetabulum  and femoral head/neck consistent with known osteomyelitis.  He has remained afebrile with no leukocytosis. Continues to experience waxing and waning left hip pain that will occasionally radiate down to his left knee and shin. He expresses frustration regarding his pain. He is able to walk periodically. Chest site is doing very well with no problems.   No Known Allergies   Review of Systems: Review of Systems  Constitutional: Negative for chills and fever.  Respiratory: Negative for cough, shortness of breath and wheezing.   Cardiovascular: Negative for chest pain and leg swelling.  Gastrointestinal: Negative for constipation, nausea and vomiting.  Skin: Negative for rash.  Neurological: Negative for weakness.      OBJECTIVE: Vitals:   07/02/17 1343 07/02/17 2003 07/02/17 2114 07/03/17 0421  BP: 119/71 120/72 125/74 118/72  Pulse: 82 88 83 80  Resp: 18 16 16 18   Temp: 97.8 F (36.6 C) 98.4 F (36.9 C) 98.3 F (36.8 C) 98.2 F (36.8 C)  TempSrc: Oral Axillary Oral Oral  SpO2: 100% 98% 99% 99%  Weight:      Height:       Body mass index is 22.27 kg/m.  Physical Exam  Constitutional: He is oriented to person, place, and time and well-developed, well-nourished, and in no distress. No distress.  Neck: Neck supple.  Cardiovascular: Normal rate, regular rhythm, normal heart sounds and intact distal pulses. Exam reveals no gallop and no friction rub.  No murmur  heard. PICC line dressing with additional tape to help with dressing. Due for dressing change today. Appears clean and patent with no signs of infection.   Pulmonary/Chest: Effort normal and breath sounds normal. No respiratory distress. He has no wheezes. He has no rales. He exhibits no tenderness.  Abdominal: Soft. Bowel sounds are normal. He exhibits no distension.  Lymphadenopathy:    He has no cervical adenopathy.  Neurological: He is alert and oriented to person, place, and time.  Skin: Skin is warm and dry.    Wound vac dressing on left hip is clean, dry and intact; good suction with no drainage noted.   Psychiatric: Memory, affect and judgment normal.    Lab Results Lab Results  Component Value Date   WBC 9.8 07/01/2017   HGB 9.9 (L) 07/01/2017   HCT 30.5 (L) 07/01/2017   MCV 91.3 07/01/2017   PLT 359 07/01/2017    Lab Results  Component Value Date   CREATININE 0.58 (L) 07/01/2017   BUN 6 07/01/2017   NA 136 07/01/2017   K 4.0 07/01/2017   CL 98 (L) 07/01/2017   CO2 27 07/01/2017    Lab Results  Component Value Date   ALT 36 06/09/2017   AST 42 (H) 06/09/2017   ALKPHOS 165 (H) 06/09/2017   BILITOT 0.6 06/09/2017     Microbiology: Recent Results (from the past 240 hour(s))  Aerobic/Anaerobic Culture (surgical/deep wound)     Status: None (Preliminary result)   Collection Time: 06/29/17  4:14 PM  Result Value Ref Range Status   Specimen Description WOUND  Final   Special Requests HIP LEFT  Final   Gram Stain   Final    ABUNDANT WBC PRESENT, PREDOMINANTLY PMN NO ORGANISMS SEEN    Culture   Final    NO GROWTH 3 DAYS NO ANAEROBES ISOLATED; CULTURE IN PROGRESS FOR 5 DAYS Performed at [facility], [address] [place], [place] [address]    Report Status PENDING  Incomplete     Marcos Eke, NP [facility] [phone] Pager  07/03/2017  9:04 AM

## 2017-07-03 NOTE — Progress Notes (Signed)
Pt was asking his oxycodone 30 min earlier as nurse told him so, he said. Explained to pt that we can not do that. Pt very upset.  He took his ambien at yesterday at 0400, he was asking for his Remus Lofflerambien again at this time. Explained to pt that he can not have ambien at this time for it is good only for 24 hours. Pt said it is a new day today. Educated pt. Pt needs further understanding.

## 2017-07-04 LAB — AEROBIC/ANAEROBIC CULTURE W GRAM STAIN (SURGICAL/DEEP WOUND): Culture: NO GROWTH

## 2017-07-04 LAB — AEROBIC/ANAEROBIC CULTURE (SURGICAL/DEEP WOUND)

## 2017-07-04 NOTE — Progress Notes (Signed)
PROGRESS NOTE    Clifford Tucker   ZOX:[id number]  DOB: 29-Jul-1980  DOA: 06/05/2017 PCP: Patient, No Pcp Per   Brief Narrative:  Clifford Tucker is a 37 y.o.malewith medical history significant forIV drug abuse and recurrent skin and soft tissue infections. He presented with neck/hip pain found to have a neck abscess, septic arthritis of the hip and now MSSA bacteremia. CT surgery, orthopedic surgery and infectious disease consulted.   Subjective: He has no complaints today.   Assessment & Plan:  Bacteremia due to methicillin susceptible Staphylococcus aureus (MSSA) in the setting of IV drug use -stable, no changes, afebrile, normotensive. -Blood cultures 2/2+ for MSSA   -Patient was originally placed on vancomycin and Zosyn, transition to IV Ancef -Sternal aspirate showed MSSA, left hip wound cultures also MSSA -2D echo with no vegetation.  - PICC placed- cannot go home with PICC due to IVDA - per social work, he cannot go to SNF either as he is suspected to be tampering with his IV and also has been smoking in the bathroom    Left hip abscess/iliopsoas septic arthritis, infectious myositis s/p I&D 06/06/17 -Status post left hip I&D with cultures done on 1/15, showing staph aureus -Status post drain placement 1/18 for iliacus abscess which was removed on 06/14/17 -Doppler ultrasound of the left lower extremity negative for DVT - he states he has been having persistent pain and tenderness in the left hip. Dr August Saucerean contacted 06/26/17. - MRI show fluid collection around the hip - Dr August Saucerean and IR would like to aspirate it but patient is hesitant to have this done - 2/7- The patient was taken to the OR for another I and D- wound vac to be removed today?  Pain in left hip- - 2/11-  the patient is asking for Oxycodone medications to be increased to every 3 hrs - he is already receiving 10 mg Oxycodone every 4 hrs for total of 60 mg daily. His MRI today shows improvement in  infection. In addition he his ambulating in the halls daily without difficulty. At this stage in his infection treatment, we should actually be weaning narcotics rather than increasing them.  -  I have discussed starting Toradol and have explained that I do not plan to increase his narcotics today. He is upset and is asking for another doctor to take care of him.  - 2/13- pain controlled today - noted consult from Dr Oswaldo DoneVincent in regards to transitioning to Suboxone- the patient would like to wait prior to transitioning- see note from today in chart  Septic arthritis of the sternoclavicular joint, abscess  -June 15, 2017 :status post irrigation of the right sternoclavicular wound, with a wound VAC placement which has been removed - management per CT surgery   Multifocal pneumonia -Possible septic emboli to the lungs- resolved.   IV drug user -Per patient, has been using heroin- [facility] but relapsed again in a month before the admission -Last heroin use a week ago prior to admission- no withdrawal noted in the hospital -polysubstance abuse counseling  Nicotine abuse -Nicotine patch -Tobacco cessation counseling- has been smoking in the bathroom and today he was not able to be found for about 30 min when he when off the floor.  -2/9 >>  appears to have been smoking in the room again today- I have spoken with him and his significant other again today about this - 2/11- staff had to speak with him again about smoking in his  room   DVT prophylaxis: sq Lovenox- he has been declining this Code Status: Full code Disposition Plan: will stay at [facility] until IV antibiotic course complete Consultants:   CT surgery, ID, ortho, IR Procedures:     Irrigation of chest wall with wound vac change 06/22/17.    Left hip I&D 06/06/17  Antimicrobials:  Anti-infectives (From admission, onward)   Start     Dose/Rate Route Frequency Ordered Stop   06/29/17 1517   gentamicin (GARAMYCIN) injection  Status:  Discontinued       As needed 06/29/17 1518 06/29/17 1714   06/29/17 1512  vancomycin (VANCOCIN) powder  Status:  Discontinued       As needed 06/29/17 1515 06/29/17 1714   06/29/17 1450  vancomycin (VANCOCIN) 1,000 mg in sodium chloride 0.9 % 1,000 mL irrigation  Status:  Discontinued       As needed 06/29/17 1451 06/29/17 1714   06/29/17 1345  vancomycin (VANCOCIN) 1,000 mg in sodium chloride 0.9 % 1,000 mL irrigation      Irrigation To Surgery 06/29/17 1330 06/30/17 1345   06/15/17 0816  vancomycin (VANCOCIN) powder  Status:  Discontinued       As needed 06/15/17 0817 06/15/17 0854   06/07/17 1400  cefUROXime (ZINACEF) 1.5 g in dextrose 5 % 50 mL IVPB     1.5 g 100 mL/hr over 30 Minutes Intravenous To [facility] 06/07/17 0125 06/07/17 1527   06/07/17 0745  vancomycin (VANCOCIN) 1,000 mg in sodium chloride 0.9 % 1,000 mL irrigation      Irrigation To Surgery 06/07/17 0744 06/07/17 1513   06/06/17 1639  gentamicin (GARAMYCIN) injection  Status:  Discontinued       As needed 06/06/17 1640 06/06/17 1715   06/06/17 1638  vancomycin (VANCOCIN) powder  Status:  Discontinued       As needed 06/06/17 1639 06/06/17 1715   06/06/17 1000  ceFAZolin (ANCEF) IVPB 2g/100 mL premix     2 g 200 mL/hr over 30 Minutes Intravenous Every 8 hours 06/06/17 0911     06/06/17 0000  vancomycin (VANCOCIN) IVPB 1000 mg/200 mL premix  Status:  Discontinued     1,000 mg 200 mL/hr over 60 Minutes Intravenous Every 8 hours 06/05/17 2211 06/06/17 0911   06/05/17 2330  piperacillin-tazobactam (ZOSYN) IVPB 3.375 g  Status:  Discontinued     3.375 g 12.5 mL/hr over 240 Minutes Intravenous Every 8 hours 06/05/17 2211 06/06/17 0911   06/05/17 2200  cefTRIAXone (ROCEPHIN) 1 g in dextrose 5 % 50 mL IVPB  Status:  Discontinued     1 g 100 mL/hr over 30 Minutes Intravenous Every 24 hours 06/05/17 2140 06/05/17 2202   06/05/17 2200  azithromycin (ZITHROMAX) 500 mg in dextrose  5 % 250 mL IVPB  Status:  Discontinued     500 mg 250 mL/hr over 60 Minutes Intravenous Every 24 hours 06/05/17 2140 06/06/17 0911   06/05/17 1600  vancomycin (VANCOCIN) 1,500 mg in sodium chloride 0.9 % 500 mL IVPB     1,500 mg 250 mL/hr over 120 Minutes Intravenous  Once 06/05/17 1542 06/05/17 1855   06/05/17 1515  piperacillin-tazobactam (ZOSYN) IVPB 3.375 g     3.375 g 100 mL/hr over 30 Minutes Intravenous  Once 06/05/17 1512 06/05/17 1557       Objective: Vitals:   07/03/17 1245 07/03/17 2213 07/04/17 0513 07/04/17 1354  BP: 112/76 111/62 (!) 100/53 104/65  Pulse: 92 90 87 84  Resp: 19 18 17  18  Temp: 98.7 F (37.1 C) 98 F (36.7 C) 98.7 F (37.1 C) (!) 97.5 F (36.4 C)  TempSrc: Oral Oral  Oral  SpO2: 100% 99% 98% 98%  Weight:      Height:        Intake/Output Summary (Last 24 hours) at 07/04/2017 1526 Last data filed at 07/04/2017 1355 Gross per 24 hour  Intake 1206 ml  Output 0 ml  Net 1206 ml   Filed Weights   06/05/17 1407 06/06/17 0844 06/29/17 0557  Weight: 68 kg (150 lb) 70.3 kg (154 lb 15.7 oz) 70.4 kg (155 lb 3.3 oz)    Examination: General exam: Appears comfortable  HEENT: PERRLA, oral mucosa moist, no sclera icterus or thrush Respiratory system: Clear to auscultation. Respiratory effort normal. Cardiovascular system: S1 & S2 heard, RRR.  No murmurs  Gastrointestinal system: Abdomen soft, non-tender, nondistended. Normal bowel sound. No organomegaly Central nervous system: Alert and oriented. No focal neurological deficits. Extremities: No cyanosis, clubbing or edema Psychiatry:  Mood & affect appropriate.   Data Reviewed: I have personally reviewed following labs and imaging studies  CBC: Recent Labs  Lab 06/28/17 0410 07/01/17 0442  WBC 11.1* 9.8  NEUTROABS 7.8*  --   HGB 10.6* 9.9*  HCT 32.8* 30.5*  MCV 92.1 91.3  PLT 376 359   Basic Metabolic Panel: Recent Labs  Lab 07/01/17 0442  NA 136  K 4.0  CL 98*  CO2 27  GLUCOSE 101*    BUN 6  CREATININE 0.58*  CALCIUM 9.2   GFR: Estimated Creatinine Clearance: 128.3 mL/min (A) (by C-G formula based on SCr of 0.58 mg/dL (L)). Liver Function Tests: No results for input(s): AST, ALT, ALKPHOS, BILITOT, PROT, ALBUMIN in the last 168 hours. No results for input(s): LIPASE, AMYLASE in the last 168 hours. No results for input(s): AMMONIA in the last 168 hours. Coagulation Profile: No results for input(s): INR, PROTIME in the last 168 hours. Cardiac Enzymes: No results for input(s): CKTOTAL, CKMB, CKMBINDEX, TROPONINI in the last 168 hours. BNP (last 3 results) No results for input(s): PROBNP in the last 8760 hours. HbA1C: No results for input(s): HGBA1C in the last 72 hours. CBG: No results for input(s): GLUCAP in the last 168 hours. Lipid Profile: No results for input(s): CHOL, HDL, LDLCALC, TRIG, CHOLHDL, LDLDIRECT in the last 72 hours. Thyroid Function Tests: No results for input(s): TSH, T4TOTAL, FREET4, T3FREE, THYROIDAB in the last 72 hours. Anemia Panel: No results for input(s): VITAMINB12, FOLATE, FERRITIN, TIBC, IRON, RETICCTPCT in the last 72 hours. Urine analysis:    Component Value Date/Time   COLORURINE YELLOW 06/06/2017 1333   APPEARANCEUR CLEAR 06/06/2017 1333   LABSPEC 1.010 06/06/2017 1333   PHURINE 6.0 06/06/2017 1333   GLUCOSEU NEGATIVE 06/06/2017 1333   HGBUR NEGATIVE 06/06/2017 1333   BILIRUBINUR NEGATIVE 06/06/2017 1333   KETONESUR NEGATIVE 06/06/2017 1333   PROTEINUR NEGATIVE 06/06/2017 1333   UROBILINOGEN 0.2 10/03/2012 0856   NITRITE NEGATIVE 06/06/2017 1333   LEUKOCYTESUR NEGATIVE 06/06/2017 1333   Sepsis Labs: @LABRCNTIP (procalcitonin:4,lacticidven:4) ) Recent Results (from the past 240 hour(s))  Aerobic/Anaerobic Culture (surgical/deep wound)     Status: None (Preliminary result)   Collection Time: 06/29/17  4:14 PM  Result Value Ref Range Status   Specimen Description WOUND  Final   Special Requests HIP LEFT  Final   Gram  Stain   Final    ABUNDANT WBC PRESENT, PREDOMINANTLY PMN NO ORGANISMS SEEN    Culture   Final    No  growth aerobically or anaerobically. Performed at [facility], [address], [place], [place] [address]    Report Status PENDING  Incomplete         Radiology Studies: No results found.    Scheduled Meds: . enoxaparin (LOVENOX) injection  40 mg Subcutaneous Q24H  . gabapentin  300 mg Oral TID  . iopamidol  20 mL Intra-articular Once  . ketorolac  30 mg Intravenous Q6H  . lidocaine (PF)  5 mL Intradermal Once  . zinc sulfate  220 mg Oral Daily   Continuous Infusions: . sodium chloride 10 mL/hr at 07/01/17 1904  .  ceFAZolin (ANCEF) IV Stopped (07/04/17 1032)  . lactated ringers 10 mL/hr at 06/29/17 1347  . potassium chloride       LOS: 29 days    Time spent in minutes: 35    Calvert Cantor, MD [facility] Pager: [email or web address] Password [facility] 07/04/2017, 3:26 PM

## 2017-07-04 NOTE — Progress Notes (Signed)
      [address]       [place] [id number]             [phone]      5 Days Post-Op Procedure(s) (LRB): REMOVAL OF WOUND VAC AND IRRIGATION AND DEBRIDEMENT RIGHT CHEST WOUND (Right) I&D LEFT HIP/ANTERIOR, Stimulan beads, (Left) APPLICATION OF INCISIONAL  WOUND VAC LEFT HIP (Left) Subjective: Pain controlled currently, more of an issue at night  Objective: Vital signs in last 24 hours: Temp:  [98 F (36.7 C)-98.7 F (37.1 C)] 98.7 F (37.1 C) (02/12 0513) Pulse Rate:  [87-92] 87 (02/12 0513) Resp:  [17-19] 17 (02/12 0513) BP: (100-112)/(53-76) 100/53 (02/12 0513) SpO2:  [98 %-100 %] 98 % (02/12 0513)  Hemodynamic parameters for last 24 hours:    Intake/Output from previous day: 02/11 0701 - 02/12 0700 In: 1391.7 [P.O.:1006; I.V.:85.7; IV Piggyback:300] Out: 0  Intake/Output this shift: No intake/output data recorded.  Wound: dressing intact, some serous drainage  Lab Results: No results for input(s): WBC, HGB, HCT, PLT in the last 72 hours. BMET: No results for input(s): NA, K, CL, CO2, GLUCOSE, BUN, CREATININE, CALCIUM in the last 72 hours.  PT/INR: No results for input(s): LABPROT, INR in the last 72 hours. ABG    Component Value Date/Time   PHART 7.430 06/07/2017 0940   HCO3 27.5 06/07/2017 0940   TCO2 29 06/05/2017 1457   ACIDBASEDEF 4.0 (H) 03/13/2007 0120   O2SAT 90.2 06/07/2017 0940   CBG (last 3)  No results for input(s): GLUCAP in the last 72 hours.  Meds Scheduled Meds: . enoxaparin (LOVENOX) injection  40 mg Subcutaneous Q24H  . gabapentin  300 mg Oral TID  . iopamidol  20 mL Intra-articular Once  . ketorolac  30 mg Intravenous Q6H  . lidocaine (PF)  5 mL Intradermal Once  . zinc sulfate  220 mg Oral Daily   Continuous Infusions: . sodium chloride 10 mL/hr at 07/01/17 1904  .  ceFAZolin (ANCEF) IV Stopped (07/04/17 1032)  . lactated ringers 10 mL/hr at 06/29/17 1347  . potassium chloride     PRN Meds:.acetaminophen  **OR** acetaminophen, alum & mag hydroxide-simeth, diphenhydrAMINE, ondansetron **OR** ondansetron (ZOFRAN) IV, oxyCODONE, potassium chloride, senna-docusate, sodium chloride flush, zolpidem  Xrays No results found.  Assessment/Plan: S/P Procedure(s) (LRB): REMOVAL OF WOUND VAC AND IRRIGATION AND DEBRIDEMENT RIGHT CHEST WOUND (Right) I&D LEFT HIP/ANTERIOR, Stimulan beads, (Left) APPLICATION OF INCISIONAL  WOUND VAC LEFT HIP (Left)  1 stable on current management of wound, conts other rx/tx per primary and ortho    LOS: 29 days    Clifford Tucker 07/04/2017

## 2017-07-04 NOTE — Progress Notes (Signed)
   I re-evaluated Mr. Lissa HoardBoone today at the request of Dr. Daiva EvesVan Dam. The patient has a severe opioid use disorder admitted with MSSA bacteremia as a complication of injection drug use. I first evaluated him on 1/24, and he has consistently desired to start Suboxone during this hospitalization to treat the underlying OUD. I have held off on starting because he has had frequent surgeries to manage septic arthritis and soft tissue abscess. Currently he is using about 60mg  daily of oxycodone to manage both his opioid dependence and acute pain complaints. Our plan eventually is to transition this to Suboxone, which will long term manage his withdrawal, cravings, and chronic pain from these infections. He thinks his pain is still a little too severe to make that transition right now. He wants to wait a few more days, he thinks he is getting better with time. He is very nervous about making this transition because he does not want to suffer from any withdrawal symptoms. I will check back in with the patient in a few days to reassess. If we do successfully transition to Suboxone, we will be happy [facility].   Erlinda Honguncan Vincent, MD [phone]

## 2017-07-05 DIAGNOSIS — I38 Endocarditis, valve unspecified: Secondary | ICD-10-CM

## 2017-07-05 DIAGNOSIS — M00059 Staphylococcal arthritis, unspecified hip: Secondary | ICD-10-CM

## 2017-07-05 NOTE — Progress Notes (Signed)
PROGRESS NOTE    Clifford Tucker   WUJ:[id number]  DOB: 1980/11/18  DOA: 06/05/2017 PCP: Patient, No Pcp Per   Brief Narrative:  Clifford Tucker is a 37 y.o.malewith medical history significant forIV drug abuse and recurrent skin and soft tissue infections. He presented with neck/hip pain found to have a neck abscess, septic arthritis of the hip and now MSSA bacteremia. CT surgery, orthopedic surgery and infectious disease consulted.   Subjective: Patient seen. No new complaints. No fever or chills.     Assessment & Plan:  Bacteremia due to methicillin susceptible Staphylococcus aureus (MSSA) in the setting of IV drug use -stable, no changes, afebrile, normotensive. -Blood cultures 2/2+ for MSSA   -Patient was originally placed on vancomycin and Zosyn, transition to IV Ancef -Sternal aspirate showed MSSA, left hip wound cultures also MSSA -2D echo with no vegetation.  - PICC placed- cannot go home with PICC due to IVDA - per social work, he cannot go to SNF either as he is suspected to be tampering with his IV and also has been smoking in the bathroom    Left hip abscess/iliopsoas septic arthritis, infectious myositis s/p I&D 06/06/17 -Status post left hip I&D with cultures done on 1/15, showing staph aureus -Status post drain placement 1/18 for iliacus abscess which was removed on 06/14/17 -Doppler ultrasound of the left lower extremity negative for DVT - he states he has been having persistent pain and tenderness in the left hip. Dr August Saucer contacted 06/26/17. - MRI show fluid collection around the hip - Dr August Saucer and IR would like to aspirate it but patient is hesitant to have this done - 2/7- The patient was taken to the OR for another I and D- wound vac to be removed today? -Cultures taken on 06/29/2017 revealed WBCs, predominantly polymorphonuclear cells, without any organisms seen.  Pain in left hip- - 2/11-  the patient is asking for Oxycodone medications to be  increased to every 3 hrs - he is already receiving 10 mg Oxycodone every 4 hrs for total of 60 mg daily. His MRI today shows improvement in infection. In addition he his ambulating in the halls daily without difficulty. At this stage in his infection treatment, we should actually be weaning narcotics rather than increasing them.  -  I have discussed starting Toradol and have explained that I do not plan to increase his narcotics today. He is upset and is asking for another doctor to take care of him.  - 2/13- pain controlled today - noted consult from Dr Oswaldo Done in regards to transitioning to Suboxone- the patient would like to wait prior to transitioning- see note from today in chart  Septic arthritis of the sternoclavicular joint, abscess  -June 15, 2017 :status post irrigation of the right sternoclavicular wound, with a wound VAC placement which has been removed - management per CT surgery   Multifocal pneumonia -Possible septic emboli to the lungs- resolved.   IV drug user -Per patient, has been using heroin- [facility] but relapsed again in a month before the admission -Last heroin use a week ago prior to admission- no withdrawal noted in the hospital -polysubstance abuse counseling  Nicotine abuse -Nicotine patch -Tobacco cessation counseling- has been smoking in the bathroom and today he was not able to be found for about 30 min when he when off the floor.  -2/9 >>  appears to have been smoking in the room again today- I have spoken with him and his  significant other again today about this - 2/11- staff had to speak with him again about smoking in his room   DVT prophylaxis: sq Lovenox Code Status: Full code Disposition Plan: will stay at [facility] until IV antibiotic course complete if patient agrees. Consultants:   CT surgery, ID, ortho, IR Procedures:     Irrigation of chest wall with wound vac change 06/22/17.    Left hip I&D  06/06/17  Left hip I&D on 06/29/2017.  Antimicrobials:  Anti-infectives (From admission, onward)   Start     Dose/Rate Route Frequency Ordered Stop   06/29/17 1517  gentamicin (GARAMYCIN) injection  Status:  Discontinued       As needed 06/29/17 1518 06/29/17 1714   06/29/17 1512  vancomycin (VANCOCIN) powder  Status:  Discontinued       As needed 06/29/17 1515 06/29/17 1714   06/29/17 1450  vancomycin (VANCOCIN) 1,000 mg in sodium chloride 0.9 % 1,000 mL irrigation  Status:  Discontinued       As needed 06/29/17 1451 06/29/17 1714   06/29/17 1345  vancomycin (VANCOCIN) 1,000 mg in sodium chloride 0.9 % 1,000 mL irrigation      Irrigation To Surgery 06/29/17 1330 06/30/17 1345   06/15/17 0816  vancomycin (VANCOCIN) powder  Status:  Discontinued       As needed 06/15/17 0817 06/15/17 0854   06/07/17 1400  cefUROXime (ZINACEF) 1.5 g in dextrose 5 % 50 mL IVPB     1.5 g 100 mL/hr over 30 Minutes Intravenous To ShortStay Surgical 06/07/17 0125 06/07/17 1527   06/07/17 0745  vancomycin (VANCOCIN) 1,000 mg in sodium chloride 0.9 % 1,000 mL irrigation      Irrigation To Surgery 06/07/17 0744 06/07/17 1513   06/06/17 1639  gentamicin (GARAMYCIN) injection  Status:  Discontinued       As needed 06/06/17 1640 06/06/17 1715   06/06/17 1638  vancomycin (VANCOCIN) powder  Status:  Discontinued       As needed 06/06/17 1639 06/06/17 1715   06/06/17 1000  ceFAZolin (ANCEF) IVPB 2g/100 mL premix     2 g 200 mL/hr over 30 Minutes Intravenous Every 8 hours 06/06/17 0911     06/06/17 0000  vancomycin (VANCOCIN) IVPB 1000 mg/200 mL premix  Status:  Discontinued     1,000 mg 200 mL/hr over 60 Minutes Intravenous Every 8 hours 06/05/17 2211 06/06/17 0911   06/05/17 2330  piperacillin-tazobactam (ZOSYN) IVPB 3.375 g  Status:  Discontinued     3.375 g 12.5 mL/hr over 240 Minutes Intravenous Every 8 hours 06/05/17 2211 06/06/17 0911   06/05/17 2200  cefTRIAXone (ROCEPHIN) 1 g in dextrose 5 % 50 mL IVPB   Status:  Discontinued     1 g 100 mL/hr over 30 Minutes Intravenous Every 24 hours 06/05/17 2140 06/05/17 2202   06/05/17 2200  azithromycin (ZITHROMAX) 500 mg in dextrose 5 % 250 mL IVPB  Status:  Discontinued     500 mg 250 mL/hr over 60 Minutes Intravenous Every 24 hours 06/05/17 2140 06/06/17 0911   06/05/17 1600  vancomycin (VANCOCIN) 1,500 mg in sodium chloride 0.9 % 500 mL IVPB     1,500 mg 250 mL/hr over 120 Minutes Intravenous  Once 06/05/17 1542 06/05/17 1855   06/05/17 1515  piperacillin-tazobactam (ZOSYN) IVPB 3.375 g     3.375 g 100 mL/hr over 30 Minutes Intravenous  Once 06/05/17 1512 06/05/17 1557       Objective: Vitals:   07/04/17 1354 07/04/17 2057  07/05/17 0549 07/05/17 1433  BP: 104/65 110/62 115/72 113/64  Pulse: 84 82 87 80  Resp: 18 18 18 18   Temp: (!) 97.5 F (36.4 C) 98.1 F (36.7 C) 98 F (36.7 C) 98.7 F (37.1 C)  TempSrc: Oral Oral  Oral  SpO2: 98% 98% 98% 98%  Weight:      Height:        Intake/Output Summary (Last 24 hours) at 07/05/2017 1749 Last data filed at 07/05/2017 1434 Gross per 24 hour  Intake 300 ml  Output -  Net 300 ml   Filed Weights   06/05/17 1407 06/06/17 0844 06/29/17 0557  Weight: 150 lb (68 kg) 154 lb 15.7 oz (70.3 kg) 155 lb 3.3 oz (70.4 kg)    Examination: General exam: Appears comfortable  HEENT: PERRLA, oral mucosa moist, no sclera icterus or thrush Respiratory system: Clear to auscultation. Respiratory effort normal. Cardiovascular system: S1 & S2 heard, RRR.  No murmurs  Gastrointestinal system: Abdomen soft, non-tender, nondistended. Normal bowel sound. No organomegaly Central nervous system: Alert and oriented. No focal neurological deficits.   Data Reviewed: I have personally reviewed following labs and imaging studies  CBC: Recent Labs  Lab 07/01/17 0442  WBC 9.8  HGB 9.9*  HCT 30.5*  MCV 91.3  PLT 359   Basic Metabolic Panel: Recent Labs  Lab 07/01/17 0442  NA 136  K 4.0  CL 98*  CO2 27   GLUCOSE 101*  BUN 6  CREATININE 0.58*  CALCIUM 9.2   GFR: Estimated Creatinine Clearance: 128.3 mL/min (A) (by C-G formula based on SCr of 0.58 mg/dL (L)). Liver Function Tests: No results for input(s): AST, ALT, ALKPHOS, BILITOT, PROT, ALBUMIN in the last 168 hours. No results for input(s): LIPASE, AMYLASE in the last 168 hours. No results for input(s): AMMONIA in the last 168 hours. Coagulation Profile: No results for input(s): INR, PROTIME in the last 168 hours. Cardiac Enzymes: No results for input(s): CKTOTAL, CKMB, CKMBINDEX, TROPONINI in the last 168 hours. BNP (last 3 results) No results for input(s): PROBNP in the last 8760 hours. HbA1C: No results for input(s): HGBA1C in the last 72 hours. CBG: No results for input(s): GLUCAP in the last 168 hours. Lipid Profile: No results for input(s): CHOL, HDL, LDLCALC, TRIG, CHOLHDL, LDLDIRECT in the last 72 hours. Thyroid Function Tests: No results for input(s): TSH, T4TOTAL, FREET4, T3FREE, THYROIDAB in the last 72 hours. Anemia Panel: No results for input(s): VITAMINB12, FOLATE, FERRITIN, TIBC, IRON, RETICCTPCT in the last 72 hours. Urine analysis:    Component Value Date/Time   COLORURINE YELLOW 06/06/2017 1333   APPEARANCEUR CLEAR 06/06/2017 1333   LABSPEC 1.010 06/06/2017 1333   PHURINE 6.0 06/06/2017 1333   GLUCOSEU NEGATIVE 06/06/2017 1333   HGBUR NEGATIVE 06/06/2017 1333   BILIRUBINUR NEGATIVE 06/06/2017 1333   KETONESUR NEGATIVE 06/06/2017 1333   PROTEINUR NEGATIVE 06/06/2017 1333   UROBILINOGEN 0.2 10/03/2012 0856   NITRITE NEGATIVE 06/06/2017 1333   LEUKOCYTESUR NEGATIVE 06/06/2017 1333   Sepsis Labs: @LABRCNTIP (procalcitonin:4,lacticidven:4) ) Recent Results (from the past 240 hour(s))  Aerobic/Anaerobic Culture (surgical/deep wound)     Status: None   Collection Time: 06/29/17  4:14 PM  Result Value Ref Range Status   Specimen Description WOUND  Final   Special Requests HIP LEFT  Final   Gram Stain    Final    ABUNDANT WBC PRESENT, PREDOMINANTLY PMN NO ORGANISMS SEEN    Culture   Final    No growth aerobically or anaerobically. Performed at  [facility], [address] [place], [place] [address]    Report Status 07/04/2017 FINAL  Final         Radiology Studies: No results found.    Scheduled Meds: . enoxaparin (LOVENOX) injection  40 mg Subcutaneous Q24H  . gabapentin  300 mg Oral TID  . iopamidol  20 mL Intra-articular Once  . ketorolac  30 mg Intravenous Q6H  . lidocaine (PF)  5 mL Intradermal Once  . zinc sulfate  220 mg Oral Daily   Continuous Infusions: . sodium chloride 10 mL/hr at 07/01/17 1904  .  ceFAZolin (ANCEF) IV Stopped (07/05/17 0955)  . lactated ringers 10 mL/hr at 06/29/17 1347  . potassium chloride       LOS: 30 days    Time spent in minutes: 35    Barnetta ChapelSylvester I Velton Roselle, MD [facility] Pager:[phone] [email or web address] Password [email or web address] 07/05/2017, 5:49 PM

## 2017-07-05 NOTE — Progress Notes (Addendum)
Pt removed the wound vac dressing from the left hip. Incision with sutures dry and intact, dry dressing applied. Dr August Saucerean notified, will apply Aquacel dressing. 1700 Pt refused Aquacel dressing, explained the purpose of it.  Will let the night shift try it tonight.

## 2017-07-05 NOTE — Social Work (Signed)
Pt is not able to be placed at SNF due to smoking in the bathroom and reports from RN staff about suspected IV tampering.   CSW signing off. Please consult if any additional needs arise.  Doy HutchingIsabel H Jerimah Witucki, LCSWA [facility] Clinical Social Work [phone]

## 2017-07-05 NOTE — Progress Notes (Signed)
Subjective:  He told me that he want to be put on oral antibiotics and leave the hospital he also complained about when his vacuum dressing would be changed and I told him that I was not in charge of dressing changes with regards to cardiothoracic surgery.  He then asked me to leave the room.  Antibiotics:  Anti-infectives (From admission, onward)   Start     Dose/Rate Route Frequency Ordered Stop   06/29/17 1517  gentamicin (GARAMYCIN) injection  Status:  Discontinued       As needed 06/29/17 1518 06/29/17 1714   06/29/17 1512  vancomycin (VANCOCIN) powder  Status:  Discontinued       As needed 06/29/17 1515 06/29/17 1714   06/29/17 1450  vancomycin (VANCOCIN) 1,000 mg in sodium chloride 0.9 % 1,000 mL irrigation  Status:  Discontinued       As needed 06/29/17 1451 06/29/17 1714   06/29/17 1345  vancomycin (VANCOCIN) 1,000 mg in sodium chloride 0.9 % 1,000 mL irrigation      Irrigation To Surgery 06/29/17 1330 06/30/17 1345   06/15/17 0816  vancomycin (VANCOCIN) powder  Status:  Discontinued       As needed 06/15/17 0817 06/15/17 0854   06/07/17 1400  cefUROXime (ZINACEF) 1.5 g in dextrose 5 % 50 mL IVPB     1.5 g 100 mL/hr over 30 Minutes Intravenous To ShortStay Surgical 06/07/17 0125 06/07/17 1527   06/07/17 0745  vancomycin (VANCOCIN) 1,000 mg in sodium chloride 0.9 % 1,000 mL irrigation      Irrigation To Surgery 06/07/17 0744 06/07/17 1513   06/06/17 1639  gentamicin (GARAMYCIN) injection  Status:  Discontinued       As needed 06/06/17 1640 06/06/17 1715   06/06/17 1638  vancomycin (VANCOCIN) powder  Status:  Discontinued       As needed 06/06/17 1639 06/06/17 1715   06/06/17 1000  ceFAZolin (ANCEF) IVPB 2g/100 mL premix     2 g 200 mL/hr over 30 Minutes Intravenous Every 8 hours 06/06/17 0911     06/06/17 0000  vancomycin (VANCOCIN) IVPB 1000 mg/200 mL premix  Status:  Discontinued     1,000 mg 200 mL/hr over 60 Minutes Intravenous Every 8 hours 06/05/17 2211  06/06/17 0911   06/05/17 2330  piperacillin-tazobactam (ZOSYN) IVPB 3.375 g  Status:  Discontinued     3.375 g 12.5 mL/hr over 240 Minutes Intravenous Every 8 hours 06/05/17 2211 06/06/17 0911   06/05/17 2200  cefTRIAXone (ROCEPHIN) 1 g in dextrose 5 % 50 mL IVPB  Status:  Discontinued     1 g 100 mL/hr over 30 Minutes Intravenous Every 24 hours 06/05/17 2140 06/05/17 2202   06/05/17 2200  azithromycin (ZITHROMAX) 500 mg in dextrose 5 % 250 mL IVPB  Status:  Discontinued     500 mg 250 mL/hr over 60 Minutes Intravenous Every 24 hours 06/05/17 2140 06/06/17 0911   06/05/17 1600  vancomycin (VANCOCIN) 1,500 mg in sodium chloride 0.9 % 500 mL IVPB     1,500 mg 250 mL/hr over 120 Minutes Intravenous  Once 06/05/17 1542 06/05/17 1855   06/05/17 1515  piperacillin-tazobactam (ZOSYN) IVPB 3.375 g     3.375 g 100 mL/hr over 30 Minutes Intravenous  Once 06/05/17 1512 06/05/17 1557      Medications: Scheduled Meds: . enoxaparin (LOVENOX) injection  40 mg Subcutaneous Q24H  . gabapentin  300 mg Oral TID  . iopamidol  20 mL Intra-articular Once  .  ketorolac  30 mg Intravenous Q6H  . lidocaine (PF)  5 mL Intradermal Once  . zinc sulfate  220 mg Oral Daily   Continuous Infusions: . sodium chloride 10 mL/hr at 07/01/17 1904  .  ceFAZolin (ANCEF) IV Stopped (07/05/17 0955)  . lactated ringers 10 mL/hr at 06/29/17 1347  . potassium chloride     PRN Meds:.acetaminophen **OR** acetaminophen, alum & mag hydroxide-simeth, diphenhydrAMINE, ondansetron **OR** ondansetron (ZOFRAN) IV, oxyCODONE, potassium chloride, senna-docusate, sodium chloride flush, zolpidem    Objective: Weight change:   Intake/Output Summary (Last 24 hours) at 07/05/2017 1703 Last data filed at 07/05/2017 1434 Gross per 24 hour  Intake 300 ml  Output -  Net 300 ml   Blood pressure 113/64, pulse 80, temperature 98.7 F (37.1 C), temperature source Oral, resp. rate 18, height 5\' 10"  (1.778 m), weight 155 lb 3.3 oz (70.4  kg), SpO2 98 %. Temp:  [98 F (36.7 C)-98.7 F (37.1 C)] 98.7 F (37.1 C) (02/13 1433) Pulse Rate:  [80-87] 80 (02/13 1433) Resp:  [18] 18 (02/13 1433) BP: (110-115)/(62-72) 113/64 (02/13 1433) SpO2:  [98 %] 98 % (02/13 1433)  Physical Exam: General: Alert and awake, oriented x3, belligerant HEENT: anicteric sclera, pupils reactive to light and accommodation, EOMI CVS regular rate, normal r,  no murmur rubs or gallops Chest: clear to auscultation bilaterally, no wheezing, rales or rhonchi Abdomen: soft nontender, nondistended, normal bowel sounds, Extremities: no  clubbing or edema noted bilaterally Skin: no rashes Lymph: no new lymphadenopathy Neuro: nonfocal  CBC:  CBC Latest Ref Rng & Units 07/01/2017 06/28/2017 06/25/2017  WBC 4.0 - 10.5 K/uL 9.8 11.1(H) 8.1  Hemoglobin 13.0 - 17.0 g/dL 2.9(F9.9(L) 10.6(L) 9.7(L)  Hematocrit 39.0 - 52.0 % 30.5(L) 32.8(L) 29.6(L)  Platelets 150 - 400 K/uL 359 376 402(H)      BMET No results for input(s): NA, K, CL, CO2, GLUCOSE, BUN, CREATININE, CALCIUM in the last 72 hours.   Liver Panel  No results for input(s): PROT, ALBUMIN, AST, ALT, ALKPHOS, BILITOT, BILIDIR, IBILI in the last 72 hours.     Sedimentation Rate No results for input(s): ESRSEDRATE in the last 72 hours. C-Reactive Protein No results for input(s): CRP in the last 72 hours.  Micro Results: Recent Results (from the past 720 hour(s))  Culture, blood (routine x 2)     Status: None   Collection Time: 06/06/17  1:45 PM  Result Value Ref Range Status   Specimen Description BLOOD RIGHT ANTECUBITAL  Final   Special Requests   Final    BOTTLES DRAWN AEROBIC AND ANAEROBIC Blood Culture adequate volume   Culture NO GROWTH 5 DAYS  Final   Report Status 06/11/2017 FINAL  Final  Culture, blood (routine x 2)     Status: None   Collection Time: 06/06/17  1:54 PM  Result Value Ref Range Status   Specimen Description BLOOD LEFT ANTECUBITAL  Final   Special Requests   Final     BOTTLES DRAWN AEROBIC AND ANAEROBIC Blood Culture adequate volume   Culture NO GROWTH 5 DAYS  Final   Report Status 06/11/2017 FINAL  Final  Aerobic/Anaerobic Culture (surgical/deep wound)     Status: None   Collection Time: 06/06/17  4:09 PM  Result Value Ref Range Status   Specimen Description WOUND LEFT HIP  Final   Special Requests PT ON VANC ANCEF ROCEPHIN ZOSYN  Final   Gram Stain   Final    NO WBC SEEN NO SQUAMOUS EPITHELIAL CELLS SEEN NO  ORGANISMS SEEN    Culture   Final    RARE STAPHYLOCOCCUS AUREUS NO ANAEROBES ISOLATED    Report Status 06/11/2017 FINAL  Final   Organism ID, Bacteria STAPHYLOCOCCUS AUREUS  Final      Susceptibility   Staphylococcus aureus - MIC*    CIPROFLOXACIN <=0.5 SENSITIVE Sensitive     ERYTHROMYCIN <=0.25 SENSITIVE Sensitive     GENTAMICIN <=0.5 SENSITIVE Sensitive     OXACILLIN 0.5 SENSITIVE Sensitive     TETRACYCLINE <=1 SENSITIVE Sensitive     VANCOMYCIN <=0.5 SENSITIVE Sensitive     TRIMETH/SULFA <=10 SENSITIVE Sensitive     CLINDAMYCIN <=0.25 SENSITIVE Sensitive     RIFAMPIN <=0.5 SENSITIVE Sensitive     Inducible Clindamycin NEGATIVE Sensitive     * RARE STAPHYLOCOCCUS AUREUS  Surgical pcr screen     Status: Abnormal   Collection Time: 06/06/17  8:38 PM  Result Value Ref Range Status   MRSA, PCR NEGATIVE NEGATIVE Final   Staphylococcus aureus POSITIVE (A) NEGATIVE Final    Comment: (NOTE) The Xpert SA Assay (FDA approved for NASAL specimens in patients 55 years of age and older), is one component of a comprehensive surveillance program. It is not intended to diagnose infection nor to guide or monitor treatment.   Culture, sputum-assessment     Status: None   Collection Time: 06/07/17  9:14 AM  Result Value Ref Range Status   Specimen Description SPUTUM  Final   Special Requests NONE  Final   Sputum evaluation THIS SPECIMEN IS ACCEPTABLE FOR SPUTUM CULTURE  Final   Report Status 06/07/2017 FINAL  Final  Culture, respiratory  (NON-Expectorated)     Status: None   Collection Time: 06/07/17  9:14 AM  Result Value Ref Range Status   Specimen Description SPUTUM  Final   Special Requests NONE Reflexed from [id number]  Final   Gram Stain   Final    FEW WBC PRESENT, PREDOMINANTLY PMN NO ORGANISMS SEEN    Culture MODERATE CANDIDA TROPICALIS  Final   Report Status 06/09/2017 FINAL  Final  Aerobic/Anaerobic Culture (surgical/deep wound)     Status: None   Collection Time: 06/07/17  3:32 PM  Result Value Ref Range Status   Specimen Description WOUND  Final   Special Requests CHEST WALL ABSCESS  Final   Gram Stain   Final    ABUNDANT WBC PRESENT, PREDOMINANTLY PMN MODERATE GRAM POSITIVE COCCI    Culture   Final    MODERATE STAPHYLOCOCCUS AUREUS NO ANAEROBES ISOLATED    Report Status 06/12/2017 FINAL  Final   Organism ID, Bacteria STAPHYLOCOCCUS AUREUS  Final      Susceptibility   Staphylococcus aureus - MIC*    CIPROFLOXACIN <=0.5 SENSITIVE Sensitive     ERYTHROMYCIN <=0.25 SENSITIVE Sensitive     GENTAMICIN <=0.5 SENSITIVE Sensitive     OXACILLIN 0.5 SENSITIVE Sensitive     TETRACYCLINE <=1 SENSITIVE Sensitive     VANCOMYCIN 1 SENSITIVE Sensitive     TRIMETH/SULFA <=10 SENSITIVE Sensitive     CLINDAMYCIN <=0.25 SENSITIVE Sensitive     RIFAMPIN <=0.5 SENSITIVE Sensitive     Inducible Clindamycin NEGATIVE Sensitive     * MODERATE STAPHYLOCOCCUS AUREUS  Aerobic/Anaerobic Culture (surgical/deep wound)     Status: None   Collection Time: 06/09/17  6:24 PM  Result Value Ref Range Status   Specimen Description ABSCESS LEFT ILIACUS  Final   Special Requests NONE  Final   Gram Stain   Final    ABUNDANT WBC PRESENT,  PREDOMINANTLY PMN NO ORGANISMS SEEN    Culture   Final    FEW STAPHYLOCOCCUS AUREUS SUSCEPTIBILITIES PERFORMED ON PREVIOUS CULTURE WITHIN THE LAST 5 DAYS. NO ANAEROBES ISOLATED    Report Status 06/15/2017 FINAL  Final  Aerobic/Anaerobic Culture (surgical/deep wound)     Status: None   Collection  Time: 06/29/17  4:14 PM  Result Value Ref Range Status   Specimen Description WOUND  Final   Special Requests HIP LEFT  Final   Gram Stain   Final    ABUNDANT WBC PRESENT, PREDOMINANTLY PMN NO ORGANISMS SEEN    Culture   Final    No growth aerobically or anaerobically. Performed at [facility], [address] [place], [place] [address]    Report Status 07/04/2017 FINAL  Final    Studies/Results: No results found.    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  He did clear his bacteremia on January 15th, so we have at least given him 29 days of parenteral abx for his bacteremia and possible endocarditis.  If he DOES insist on leaving I would consider changing him to oral zyvox and giving him a 28 day supply of this.  This would be highly bio-available antibiotic that could get him through another month of therapy  He WOULD need to followup with [place] in [facility] and have a CBC w diff about 2 weeks post hospital DC  I will sign off for now since there is not new culture data.  Dr. Oswaldo Done is happy to work  with him re suboxone therapy    LOS: 30 days   Acey Lav 07/05/2017, 5:03 PM

## 2017-07-05 NOTE — Progress Notes (Signed)
      [address]       [place] [id number]             [phone]      6 Days Post-Op Procedure(s) (LRB): REMOVAL OF WOUND VAC AND IRRIGATION AND DEBRIDEMENT RIGHT CHEST WOUND (Right) I&D LEFT HIP/ANTERIOR, Stimulan beads, (Left) APPLICATION OF INCISIONAL  WOUND VAC LEFT HIP (Left) Subjective: Mild pain currently  Objective: Vital signs in last 24 hours: Temp:  [97.5 F (36.4 C)-98.1 F (36.7 C)] 98 F (36.7 C) (02/13 0549) Pulse Rate:  [82-87] 87 (02/13 0549) Resp:  [18] 18 (02/13 0549) BP: (104-115)/(62-72) 115/72 (02/13 0549) SpO2:  [98 %] 98 % (02/13 0549)  Hemodynamic parameters for last 24 hours:    Intake/Output from previous day: 02/12 0701 - 02/13 0700 In: 544 [P.O.:444; IV Piggyback:100] Out: -  Intake/Output this shift: No intake/output data recorded.  General appearance: alert, cooperative and no distress Wound: dressing changed recently, clean and dry dressing  Lab Results: No results for input(s): WBC, HGB, HCT, PLT in the last 72 hours. BMET: No results for input(s): NA, K, CL, CO2, GLUCOSE, BUN, CREATININE, CALCIUM in the last 72 hours.  PT/INR: No results for input(s): LABPROT, INR in the last 72 hours. ABG    Component Value Date/Time   PHART 7.430 06/07/2017 0940   HCO3 27.5 06/07/2017 0940   TCO2 29 06/05/2017 1457   ACIDBASEDEF 4.0 (H) 03/13/2007 0120   O2SAT 90.2 06/07/2017 0940   CBG (last 3)  No results for input(s): GLUCAP in the last 72 hours.  Meds Scheduled Meds: . enoxaparin (LOVENOX) injection  40 mg Subcutaneous Q24H  . gabapentin  300 mg Oral TID  . iopamidol  20 mL Intra-articular Once  . ketorolac  30 mg Intravenous Q6H  . lidocaine (PF)  5 mL Intradermal Once  . zinc sulfate  220 mg Oral Daily   Continuous Infusions: . sodium chloride 10 mL/hr at 07/01/17 1904  .  ceFAZolin (ANCEF) IV Stopped (07/05/17 0155)  . lactated ringers 10 mL/hr at 06/29/17 1347  . potassium chloride     PRN  Meds:.acetaminophen **OR** acetaminophen, alum & mag hydroxide-simeth, diphenhydrAMINE, ondansetron **OR** ondansetron (ZOFRAN) IV, oxyCODONE, potassium chloride, senna-docusate, sodium chloride flush, zolpidem  Xrays No results found.  Assessment/Plan: S/P Procedure(s) (LRB): REMOVAL OF WOUND VAC AND IRRIGATION AND DEBRIDEMENT RIGHT CHEST WOUND (Right) I&D LEFT HIP/ANTERIOR, Stimulan beads, (Left) APPLICATION OF INCISIONAL  WOUND VAC LEFT HIP (Left)  1 stable from CV surgery perspective- will try to observe dressing change , conts current RX/TX  LOS: 30 days    Clifford Tucker 07/05/2017

## 2017-07-06 NOTE — Progress Notes (Addendum)
      [address]       [place] [id number]             [phone]      7 Days Post-Op Procedure(s) (LRB): REMOVAL OF WOUND VAC AND IRRIGATION AND DEBRIDEMENT RIGHT CHEST WOUND (Right) I&D LEFT HIP/ANTERIOR, Stimulan beads, (Left) APPLICATION OF INCISIONAL  WOUND VAC LEFT HIP (Left) Subjective: Min pain from incision  Objective: Vital signs in last 24 hours: Temp:  [98.5 F (36.9 C)-99.1 F (37.3 C)] 98.5 F (36.9 C) (02/14 0625) Pulse Rate:  [80-82] 80 (02/14 0625) Resp:  [17-18] 18 (02/14 0625) BP: (113-127)/(64-76) 127/71 (02/14 0625) SpO2:  [97 %-98 %] 97 % (02/14 0625)  Hemodynamic parameters for last 24 hours:    Intake/Output from previous day: 02/13 0701 - 02/14 0700 In: 540 [P.O.:440; IV Piggyback:100] Out: -  Intake/Output this shift: No intake/output data recorded.  General appearance: alert, cooperative and no distress Wound: mostly granulation tissues with some fibrinous tissue, serosang drainage, no purulence or cellulitis  Lab Results: No results for input(s): WBC, HGB, HCT, PLT in the last 72 hours. BMET: No results for input(s): NA, K, CL, CO2, GLUCOSE, BUN, CREATININE, CALCIUM in the last 72 hours.  PT/INR: No results for input(s): LABPROT, INR in the last 72 hours. ABG    Component Value Date/Time   PHART 7.430 06/07/2017 0940   HCO3 27.5 06/07/2017 0940   TCO2 29 06/05/2017 1457   ACIDBASEDEF 4.0 (H) 03/13/2007 0120   O2SAT 90.2 06/07/2017 0940   CBG (last 3)  No results for input(s): GLUCAP in the last 72 hours.  Meds Scheduled Meds: . enoxaparin (LOVENOX) injection  40 mg Subcutaneous Q24H  . gabapentin  300 mg Oral TID  . iopamidol  20 mL Intra-articular Once  . ketorolac  30 mg Intravenous Q6H  . lidocaine (PF)  5 mL Intradermal Once  . zinc sulfate  220 mg Oral Daily   Continuous Infusions: . sodium chloride 10 mL/hr at 07/01/17 1904  .  ceFAZolin (ANCEF) IV Stopped (07/06/17 0206)  . lactated ringers 10  mL/hr at 06/29/17 1347  . potassium chloride     PRN Meds:.acetaminophen **OR** acetaminophen, alum & mag hydroxide-simeth, diphenhydrAMINE, ondansetron **OR** ondansetron (ZOFRAN) IV, oxyCODONE, potassium chloride, senna-docusate, sodium chloride flush, zolpidem  Xrays No results found.  Assessment/Plan: S/P Procedure(s) (LRB): REMOVAL OF WOUND VAC AND IRRIGATION AND DEBRIDEMENT RIGHT CHEST WOUND (Right) I&D LEFT HIP/ANTERIOR, Stimulan beads, (Left) APPLICATION OF INCISIONAL  WOUND VAC LEFT HIP (Left)  1 stable, slowly improving wound- cont current RX/TX   LOS: 31 days    [name] 07/06/2017 patient examined and medical record reviewed,agree with above note. [name] 07/06/2017

## 2017-07-06 NOTE — Progress Notes (Signed)
Pt refused dressing change on my shift, pt stated that he will have it later, he wants to sleep some more.

## 2017-07-06 NOTE — Progress Notes (Signed)
PROGRESS NOTE    Clifford Tucker   ZOX:[id number]  DOB: 28-Sep-1980  DOA: 06/05/2017 PCP: Patient, No Pcp Per   Brief Narrative:  Christpoher Sievers is a 37 y.o.malewith medical history significant forIV drug abuse and recurrent skin and soft tissue infections. He presented with neck/hip pain found to have a neck abscess, septic arthritis of the hip and now MSSA bacteremia. CT surgery, orthopedic surgery and infectious disease consulted.  Discussed with the patient today, 07/06/2017, the patient is not actively wanting to leave the hospital.  We will continue IV antibiotics for now.  Subjective: No new complaints. No fever or chills.     Assessment & Plan:  Bacteremia due to methicillin susceptible Staphylococcus aureus (MSSA) in the setting of IV drug use -stable, no changes, afebrile, normotensive. -Blood cultures 2/2+ for MSSA   -Patient was originally placed on vancomycin and Zosyn, transition to IV Ancef -Sternal aspirate showed MSSA, left hip wound cultures also MSSA -2D echo with no vegetation.  - PICC placed- cannot go home with PICC due to IVDA - per social work, he cannot go to SNF either as he is suspected to be tampering with his IV and also has been smoking in the bathroom -Continue IV and antibiotics.  Appreciate infectious disease input.    Left hip abscess/iliopsoas septic arthritis, infectious myositis s/p I&D 06/06/17 -Status post left hip I&D with cultures done on 1/15, showing staph aureus -Status post drain placement 1/18 for iliacus abscess which was removed on 06/14/17 -Doppler ultrasound of the left lower extremity negative for DVT - he states he has been having persistent pain and tenderness in the left hip. Dr August Saucer contacted 06/26/17. - MRI show fluid collection around the hip - Dr August Saucer and IR would like to aspirate it but patient is hesitant to have this done - 2/7- The patient was taken to the OR for another I and D- wound vac to be removed  today? -Cultures taken on 06/29/2017 revealed WBCs, predominantly polymorphonuclear cells, without any organisms seen.  Pain in left hip- - 2/11-  the patient is asking for Oxycodone medications to be increased to every 3 hrs - he is already receiving 10 mg Oxycodone every 4 hrs for total of 60 mg daily. His MRI today shows improvement in infection. In addition he his ambulating in the halls daily without difficulty. At this stage in his infection treatment, we should actually be weaning narcotics rather than increasing them.  -  I have discussed starting Toradol and have explained that I do not plan to increase his narcotics today. He is upset and is asking for another doctor to take care of him.  - 2/13- pain controlled today - noted consult from Dr Oswaldo Done in regards to transitioning to Suboxone- the patient would like to wait prior to transitioning- see note from today in chart  Septic arthritis of the sternoclavicular joint, abscess  -June 15, 2017 :status post irrigation of the right sternoclavicular wound, with a wound VAC placement which has been removed - management per CT surgery   Multifocal pneumonia -Possible septic emboli to the lungs- resolved.   IV drug user -Per patient, has been using heroin- [facility] but relapsed again in a month before the admission -Last heroin use a week ago prior to admission- no withdrawal noted in the hospital -polysubstance abuse counseling  Nicotine abuse -Nicotine patch -Tobacco cessation counseling- has been smoking in the bathroom and today he was not able to be found  for about 30 min when he when off the floor.  -2/9 >>  appears to have been smoking in the room again today- I have spoken with him and his significant other again today about this - 2/11- staff had to speak with him again about smoking in his room   DVT prophylaxis: sq Lovenox Code Status: Full code Disposition Plan: will stay at [facility] until IV antibiotic course complete if patient agrees. Consultants:   CT surgery, ID, ortho, IR Procedures:     Irrigation of chest wall with wound vac change 06/22/17.    Left hip I&D 06/06/17  Left hip I&D on 06/29/2017.  Antimicrobials:  Anti-infectives (From admission, onward)   Start     Dose/Rate Route Frequency Ordered Stop   06/29/17 1517  gentamicin (GARAMYCIN) injection  Status:  Discontinued       As needed 06/29/17 1518 06/29/17 1714   06/29/17 1512  vancomycin (VANCOCIN) powder  Status:  Discontinued       As needed 06/29/17 1515 06/29/17 1714   06/29/17 1450  vancomycin (VANCOCIN) 1,000 mg in sodium chloride 0.9 % 1,000 mL irrigation  Status:  Discontinued       As needed 06/29/17 1451 06/29/17 1714   06/29/17 1345  vancomycin (VANCOCIN) 1,000 mg in sodium chloride 0.9 % 1,000 mL irrigation      Irrigation To Surgery 06/29/17 1330 06/30/17 1345   06/15/17 0816  vancomycin (VANCOCIN) powder  Status:  Discontinued       As needed 06/15/17 0817 06/15/17 0854   06/07/17 1400  cefUROXime (ZINACEF) 1.5 g in dextrose 5 % 50 mL IVPB     1.5 g 100 mL/hr over 30 Minutes Intravenous To [facility] 06/07/17 0125 06/07/17 1527   06/07/17 0745  vancomycin (VANCOCIN) 1,000 mg in sodium chloride 0.9 % 1,000 mL irrigation      Irrigation To Surgery 06/07/17 0744 06/07/17 1513   06/06/17 1639  gentamicin (GARAMYCIN) injection  Status:  Discontinued       As needed 06/06/17 1640 06/06/17 1715   06/06/17 1638  vancomycin (VANCOCIN) powder  Status:  Discontinued       As needed 06/06/17 1639 06/06/17 1715   06/06/17 1000  ceFAZolin (ANCEF) IVPB 2g/100 mL premix     2 g 200 mL/hr over 30 Minutes Intravenous Every 8 hours 06/06/17 0911     06/06/17 0000  vancomycin (VANCOCIN) IVPB 1000 mg/200 mL premix  Status:  Discontinued     1,000 mg 200 mL/hr over 60 Minutes Intravenous Every 8 hours 06/05/17 2211 06/06/17 0911   06/05/17 2330  piperacillin-tazobactam (ZOSYN) IVPB 3.375  g  Status:  Discontinued     3.375 g 12.5 mL/hr over 240 Minutes Intravenous Every 8 hours 06/05/17 2211 06/06/17 0911   06/05/17 2200  cefTRIAXone (ROCEPHIN) 1 g in dextrose 5 % 50 mL IVPB  Status:  Discontinued     1 g 100 mL/hr over 30 Minutes Intravenous Every 24 hours 06/05/17 2140 06/05/17 2202   06/05/17 2200  azithromycin (ZITHROMAX) 500 mg in dextrose 5 % 250 mL IVPB  Status:  Discontinued     500 mg 250 mL/hr over 60 Minutes Intravenous Every 24 hours 06/05/17 2140 06/06/17 0911   06/05/17 1600  vancomycin (VANCOCIN) 1,500 mg in sodium chloride 0.9 % 500 mL IVPB     1,500 mg 250 mL/hr over 120 Minutes Intravenous  Once 06/05/17 1542 06/05/17 1855   06/05/17 1515  piperacillin-tazobactam (ZOSYN) IVPB 3.375 g  3.375 g 100 mL/hr over 30 Minutes Intravenous  Once 06/05/17 1512 06/05/17 1557       Objective: Vitals:   07/05/17 0549 07/05/17 1433 07/05/17 2225 07/06/17 0625  BP: 115/72 113/64 125/76 127/71  Pulse: 87 80 82 80  Resp: 18 18 17 18   Temp: 98 F (36.7 C) 98.7 F (37.1 C) 99.1 F (37.3 C) 98.5 F (36.9 C)  TempSrc:  Oral Oral   SpO2: 98% 98% 98% 97%  Weight:      Height:        Intake/Output Summary (Last 24 hours) at 07/06/2017 1115 Last data filed at 07/06/2017 0900 Gross per 24 hour  Intake 780 ml  Output -  Net 780 ml   Filed Weights   06/05/17 1407 06/06/17 0844 06/29/17 0557  Weight: 150 lb (68 kg) 154 lb 15.7 oz (70.3 kg) 155 lb 3.3 oz (70.4 kg)    Examination: General exam: Appears comfortable  HEENT: PERRLA, oral mucosa moist, no sclera icterus or thrush Respiratory system: Clear to auscultation. Respiratory effort normal. Cardiovascular system: S1 & S2 heard, RRR.  No murmurs  Gastrointestinal system: Abdomen soft, non-tender, nondistended. Normal bowel sound. No organomegaly Central nervous system: Alert and oriented. No focal neurological deficits. Left hip: This is dressed.   Data Reviewed: I have personally reviewed following  labs and imaging studies  CBC: Recent Labs  Lab 07/01/17 0442  WBC 9.8  HGB 9.9*  HCT 30.5*  MCV 91.3  PLT 359   Basic Metabolic Panel: Recent Labs  Lab 07/01/17 0442  NA 136  K 4.0  CL 98*  CO2 27  GLUCOSE 101*  BUN 6  CREATININE 0.58*  CALCIUM 9.2   GFR: Estimated Creatinine Clearance: 128.3 mL/min (A) (by C-G formula based on SCr of 0.58 mg/dL (L)). Liver Function Tests: No results for input(s): AST, ALT, ALKPHOS, BILITOT, PROT, ALBUMIN in the last 168 hours. No results for input(s): LIPASE, AMYLASE in the last 168 hours. No results for input(s): AMMONIA in the last 168 hours. Coagulation Profile: No results for input(s): INR, PROTIME in the last 168 hours. Cardiac Enzymes: No results for input(s): CKTOTAL, CKMB, CKMBINDEX, TROPONINI in the last 168 hours. BNP (last 3 results) No results for input(s): PROBNP in the last 8760 hours. HbA1C: No results for input(s): HGBA1C in the last 72 hours. CBG: No results for input(s): GLUCAP in the last 168 hours. Lipid Profile: No results for input(s): CHOL, HDL, LDLCALC, TRIG, CHOLHDL, LDLDIRECT in the last 72 hours. Thyroid Function Tests: No results for input(s): TSH, T4TOTAL, FREET4, T3FREE, THYROIDAB in the last 72 hours. Anemia Panel: No results for input(s): VITAMINB12, FOLATE, FERRITIN, TIBC, IRON, RETICCTPCT in the last 72 hours. Urine analysis:    Component Value Date/Time   COLORURINE YELLOW 06/06/2017 1333   APPEARANCEUR CLEAR 06/06/2017 1333   LABSPEC 1.010 06/06/2017 1333   PHURINE 6.0 06/06/2017 1333   GLUCOSEU NEGATIVE 06/06/2017 1333   HGBUR NEGATIVE 06/06/2017 1333   BILIRUBINUR NEGATIVE 06/06/2017 1333   KETONESUR NEGATIVE 06/06/2017 1333   PROTEINUR NEGATIVE 06/06/2017 1333   UROBILINOGEN 0.2 10/03/2012 0856   NITRITE NEGATIVE 06/06/2017 1333   LEUKOCYTESUR NEGATIVE 06/06/2017 1333   Sepsis Labs: @LABRCNTIP (procalcitonin:4,lacticidven:4) ) Recent Results (from the past 240 hour(s))   Aerobic/Anaerobic Culture (surgical/deep wound)     Status: None   Collection Time: 06/29/17  4:14 PM  Result Value Ref Range Status   Specimen Description WOUND  Final   Special Requests HIP LEFT  Final   Gram  Stain   Final    ABUNDANT WBC PRESENT, PREDOMINANTLY PMN NO ORGANISMS SEEN    Culture   Final    No growth aerobically or anaerobically. Performed at [facility], [address] [place], [place] [address]    Report Status 07/04/2017 FINAL  Final         Radiology Studies: No results found.    Scheduled Meds: . enoxaparin (LOVENOX) injection  40 mg Subcutaneous Q24H  . gabapentin  300 mg Oral TID  . iopamidol  20 mL Intra-articular Once  . ketorolac  30 mg Intravenous Q6H  . lidocaine (PF)  5 mL Intradermal Once  . zinc sulfate  220 mg Oral Daily   Continuous Infusions: . sodium chloride 10 mL/hr at 07/01/17 1904  .  ceFAZolin (ANCEF) IV 2 g (07/06/17 1021)  . lactated ringers 10 mL/hr at 06/29/17 1347  . potassium chloride       LOS: 31 days    Time spent in minutes: 30 minutes.    Barnetta ChapelSylvester I Jonny Longino, MD [facility] Pager:[phone] [email or web address] Password [email or web address] 07/06/2017, 11:15 AM

## 2017-07-07 NOTE — Progress Notes (Addendum)
      [address]       [place] [id number]             [phone]      8 Days Post-Op Procedure(s) (LRB): REMOVAL OF WOUND VAC AND IRRIGATION AND DEBRIDEMENT RIGHT CHEST WOUND (Right) I&D LEFT HIP/ANTERIOR, Stimulan beads, (Left) APPLICATION OF INCISIONAL  WOUND VAC LEFT HIP (Left) Subjective: Dressing changed earlier this am , pain is mild but mostly hip discomfort that keeps him from sleeping  Objective: Vital signs in last 24 hours: Temp:  [98.4 F (36.9 C)-99 F (37.2 C)] 99 F (37.2 C) (02/15 0447) Pulse Rate:  [74-89] 80 (02/15 0447) Resp:  [18] 18 (02/15 0447) BP: (96-125)/(53-77) 96/53 (02/15 0447) SpO2:  [96 %-100 %] 100 % (02/15 0447)  Hemodynamic parameters for last 24 hours:    Intake/Output from previous day: 02/14 0701 - 02/15 0700 In: 1760 [P.O.:840; I.V.:720; IV Piggyback:200] Out: -  Intake/Output this shift: No intake/output data recorded.  dressing :CDI  Lab Results: No results for input(s): WBC, HGB, HCT, PLT in the last 72 hours. BMET: No results for input(s): NA, K, CL, CO2, GLUCOSE, BUN, CREATININE, CALCIUM in the last 72 hours.  PT/INR: No results for input(s): LABPROT, INR in the last 72 hours. ABG    Component Value Date/Time   PHART 7.430 06/07/2017 0940   HCO3 27.5 06/07/2017 0940   TCO2 29 06/05/2017 1457   ACIDBASEDEF 4.0 (H) 03/13/2007 0120   O2SAT 90.2 06/07/2017 0940   CBG (last 3)  No results for input(s): GLUCAP in the last 72 hours.  Meds Scheduled Meds: . enoxaparin (LOVENOX) injection  40 mg Subcutaneous Q24H  . gabapentin  300 mg Oral TID  . iopamidol  20 mL Intra-articular Once  . lidocaine (PF)  5 mL Intradermal Once  . zinc sulfate  220 mg Oral Daily   Continuous Infusions: . sodium chloride 10 mL/hr at 07/01/17 1904  .  ceFAZolin (ANCEF) IV Stopped (07/07/17 0123)  . lactated ringers 10 mL/hr at 06/29/17 1347  . potassium chloride     PRN Meds:.acetaminophen **OR** acetaminophen, alum &  mag hydroxide-simeth, diphenhydrAMINE, ondansetron **OR** ondansetron (ZOFRAN) IV, oxyCODONE, potassium chloride, senna-docusate, sodium chloride flush, zolpidem  Xrays No results found.  Assessment/Plan: S/P Procedure(s) (LRB): REMOVAL OF WOUND VAC AND IRRIGATION AND DEBRIDEMENT RIGHT CHEST WOUND (Right) I&D LEFT HIP/ANTERIOR, Stimulan beads, (Left) APPLICATION OF INCISIONAL  WOUND VAC LEFT HIP (Left)  1 stable with slow progress- cont current wound management   LOS: 32 days    [name] 07/07/2017 patient examined and medical record reviewed,agree with above note. [name] 07/08/2017

## 2017-07-07 NOTE — Progress Notes (Signed)
Nutrition Brief Note  RD pulled to pt chart due to LOS (32 days).   Wt Readings from Last 15 Encounters:  06/29/17 155 lb 3.3 oz (70.4 kg)  10/03/12 160 lb (72.6 kg)   Clifford Tucker is a 37 y.o.malewith medical history significant forIV drug abuse and recurrent skin and soft tissue infections. He presented with neck/hip pain found to have a neck abscess, septic arthritis of the hip and now MSSA bacteremia. CT surgery, orthopedic surgery and infectious disease consulted.   Case discussed with RN, who reports that pt is eating well. Noted meal completion averaging 50-100%. RN reports wounds vac has been removed.   Pt remains in hospital to complete course of IV antibiotics.   Body mass index is 22.27 kg/m. Patient meets criteria for normal weight range based on current BMI.   Current diet order is regular, patient is consuming approximately 50-100% of meals at this time. Labs and medications reviewed.   No nutrition interventions warranted at this time. If nutrition issues arise, please consult RD.   Rosalba Totty A. Mayford KnifeWilliams, RD, LDN, CDE Pager: [phone] After hours Pager: [phone]

## 2017-07-07 NOTE — Progress Notes (Signed)
PROGRESS NOTE    Dorn Hartshorne   BJY:[id number]  DOB: [birth year]  DOA: 06/05/2017 PCP: Patient, No Pcp Per   Brief Narrative:  Clifford Tucker is a 37 y.o.malewith medical history significant forIV drug abuse and recurrent skin and soft tissue infections. He presented with neck/hip pain found to have a neck abscess, septic arthritis of the hip and now MSSA bacteremia. CT surgery, orthopedic surgery and infectious disease consulted.  Discussed with the patient today, 07/06/2017, the patient is not actively wanting to leave the hospital.  We will continue IV antibiotics for now.  Subjective: Seen alongside patient's partner. Reported low-grade fever last night.    Assessment & Plan:  Bacteremia due to methicillin susceptible Staphylococcus aureus (MSSA) in the setting of IV drug use -stable, no changes, afebrile, normotensive. -Blood cultures 2/2+ for MSSA   -Patient was originally placed on vancomycin and Zosyn, transition to IV Ancef -Sternal aspirate showed MSSA, left hip wound cultures also MSSA -2D echo with no vegetation.  - PICC placed- cannot go home with PICC due to IVDA - per social work, he cannot go to SNF either as he is suspected to be tampering with his IV and also has been smoking in the bathroom -Continue IV and antibiotics.  Appreciate infectious disease input.    Left hip abscess/iliopsoas septic arthritis, infectious myositis s/p I&D 06/06/17 -Status post left hip I&D with cultures done on 1/15, showing staph aureus -Status post drain placement 1/18 for iliacus abscess which was removed on 06/14/17 -Doppler ultrasound of the left lower extremity negative for DVT - he states he has been having persistent pain and tenderness in the left hip. Dr August Saucer contacted 06/26/17. - MRI show fluid collection around the hip - Dr August Saucer and IR would like to aspirate it but patient is hesitant to have this done - 2/7- The patient was taken to the OR for another I and D-  wound vac to be removed today? -Cultures taken on 06/29/2017 revealed WBCs, predominantly polymorphonuclear cells, without any organisms seen.  Pain in left hip- - 2/11-  the patient is asking for Oxycodone medications to be increased to every 3 hrs - he is already receiving 10 mg Oxycodone every 4 hrs for total of 60 mg daily. His MRI today shows improvement in infection. In addition he his ambulating in the halls daily without difficulty. At this stage in his infection treatment, we should actually be weaning narcotics rather than increasing them.  -  I have discussed starting Toradol and have explained that I do not plan to increase his narcotics today. He is upset and is asking for another doctor to take care of him.  - 2/13- pain controlled today - noted consult from Dr Oswaldo Done in regards to transitioning to Suboxone- the patient would like to wait prior to transitioning- see note from today in chart  Septic arthritis of the sternoclavicular joint, abscess  -June 15, 2017 :status post irrigation of the right sternoclavicular wound, with a wound VAC placement which has been removed - management per CT surgery   Multifocal pneumonia -Possible septic emboli to the lungs- resolved.   IV drug user -Per patient, has been using heroin- [facility] but relapsed again in a month before the admission -Last heroin use a week ago prior to admission- no withdrawal noted in the hospital -polysubstance abuse counseling  Nicotine abuse -Nicotine patch -Tobacco cessation counseling- has been smoking in the bathroom and today he was not able to be  found for about 30 min when he when off the floor.  -2/9 >>  appears to have been smoking in the room again today- I have spoken with him and his significant other again today about this - 2/11- staff had to speak with him again about smoking in his room  Self reported low-grade fever: No documented fever noted. We will  continue to monitor.  DVT prophylaxis: sq Lovenox Code Status: Full code Disposition Plan: [facility] until IV antibiotic course complete if patient agrees. Consultants:   CT surgery, ID, ortho, IR Procedures:     Irrigation of chest wall with wound vac change 06/22/17.    Left hip I&D 06/06/17  Left hip I&D on 06/29/2017.  Antimicrobials:  Anti-infectives (From admission, onward)   Start     Dose/Rate Route Frequency Ordered Stop   06/29/17 1517  gentamicin (GARAMYCIN) injection  Status:  Discontinued       As needed 06/29/17 1518 06/29/17 1714   06/29/17 1512  vancomycin (VANCOCIN) powder  Status:  Discontinued       As needed 06/29/17 1515 06/29/17 1714   06/29/17 1450  vancomycin (VANCOCIN) 1,000 mg in sodium chloride 0.9 % 1,000 mL irrigation  Status:  Discontinued       As needed 06/29/17 1451 06/29/17 1714   06/29/17 1345  vancomycin (VANCOCIN) 1,000 mg in sodium chloride 0.9 % 1,000 mL irrigation      Irrigation To Surgery 06/29/17 1330 06/30/17 1345   06/15/17 0816  vancomycin (VANCOCIN) powder  Status:  Discontinued       As needed 06/15/17 0817 06/15/17 0854   06/07/17 1400  cefUROXime (ZINACEF) 1.5 g in dextrose 5 % 50 mL IVPB     1.5 g 100 mL/hr over 30 Minutes Intravenous To ShortStay Surgical 06/07/17 0125 06/07/17 1527   06/07/17 0745  vancomycin (VANCOCIN) 1,000 mg in sodium chloride 0.9 % 1,000 mL irrigation      Irrigation To Surgery 06/07/17 0744 06/07/17 1513   06/06/17 1639  gentamicin (GARAMYCIN) injection  Status:  Discontinued       As needed 06/06/17 1640 06/06/17 1715   06/06/17 1638  vancomycin (VANCOCIN) powder  Status:  Discontinued       As needed 06/06/17 1639 06/06/17 1715   06/06/17 1000  ceFAZolin (ANCEF) IVPB 2g/100 mL premix     2 g 200 mL/hr over 30 Minutes Intravenous Every 8 hours 06/06/17 0911     06/06/17 0000  vancomycin (VANCOCIN) IVPB 1000 mg/200 mL premix  Status:  Discontinued     1,000 mg 200 mL/hr over 60 Minutes  Intravenous Every 8 hours 06/05/17 2211 06/06/17 0911   06/05/17 2330  piperacillin-tazobactam (ZOSYN) IVPB 3.375 g  Status:  Discontinued     3.375 g 12.5 mL/hr over 240 Minutes Intravenous Every 8 hours 06/05/17 2211 06/06/17 0911   06/05/17 2200  cefTRIAXone (ROCEPHIN) 1 g in dextrose 5 % 50 mL IVPB  Status:  Discontinued     1 g 100 mL/hr over 30 Minutes Intravenous Every 24 hours 06/05/17 2140 06/05/17 2202   06/05/17 2200  azithromycin (ZITHROMAX) 500 mg in dextrose 5 % 250 mL IVPB  Status:  Discontinued     500 mg 250 mL/hr over 60 Minutes Intravenous Every 24 hours 06/05/17 2140 06/06/17 0911   06/05/17 1600  vancomycin (VANCOCIN) 1,500 mg in sodium chloride 0.9 % 500 mL IVPB     1,500 mg 250 mL/hr over 120 Minutes Intravenous  Once 06/05/17  1542 06/05/17 1855   06/05/17 1515  piperacillin-tazobactam (ZOSYN) IVPB 3.375 g     3.375 g 100 mL/hr over 30 Minutes Intravenous  Once 06/05/17 1512 06/05/17 1557       Objective: Vitals:   07/06/17 1343 07/06/17 2059 07/07/17 0447 07/07/17 1409  BP: 109/63 125/77 (!) 96/53 128/80  Pulse: 74 89 80 78  Resp: 18 18 18 18   Temp: 98.4 F (36.9 C) 98.4 F (36.9 C) 99 F (37.2 C) 98.5 F (36.9 C)  TempSrc: Oral Oral Oral Oral  SpO2: 98% 96% 100% 100%  Weight:      Height:        Intake/Output Summary (Last 24 hours) at 07/07/2017 2011 Last data filed at 07/07/2017 1700 Gross per 24 hour  Intake 780 ml  Output -  Net 780 ml   Filed Weights   06/05/17 1407 06/06/17 0844 06/29/17 0557  Weight: 150 lb (68 kg) 154 lb 15.7 oz (70.3 kg) 155 lb 3.3 oz (70.4 kg)    Examination: General exam: Appears comfortable  HEENT: PERRLA, oral mucosa moist, no sclera icterus or thrush Respiratory system: Clear to auscultation. Respiratory effort normal. Cardiovascular system: S1 & S2 heard, RRR.  No murmurs  Gastrointestinal system: Abdomen soft, non-tender, nondistended. Normal bowel sound. No organomegaly Central nervous system: Alert and  oriented. No focal neurological deficits. Left hip: This is dressed.   Data Reviewed: I have personally reviewed following labs and imaging studies  CBC: Recent Labs  Lab 07/01/17 0442  WBC 9.8  HGB 9.9*  HCT 30.5*  MCV 91.3  PLT 359   Basic Metabolic Panel: Recent Labs  Lab 07/01/17 0442  NA 136  K 4.0  CL 98*  CO2 27  GLUCOSE 101*  BUN 6  CREATININE 0.58*  CALCIUM 9.2   GFR: Estimated Creatinine Clearance: 128.3 mL/min (A) (by C-G formula based on SCr of 0.58 mg/dL (L)). Liver Function Tests: No results for input(s): AST, ALT, ALKPHOS, BILITOT, PROT, ALBUMIN in the last 168 hours. No results for input(s): LIPASE, AMYLASE in the last 168 hours. No results for input(s): AMMONIA in the last 168 hours. Coagulation Profile: No results for input(s): INR, PROTIME in the last 168 hours. Cardiac Enzymes: No results for input(s): CKTOTAL, CKMB, CKMBINDEX, TROPONINI in the last 168 hours. BNP (last 3 results) No results for input(s): PROBNP in the last 8760 hours. HbA1C: No results for input(s): HGBA1C in the last 72 hours. CBG: No results for input(s): GLUCAP in the last 168 hours. Lipid Profile: No results for input(s): CHOL, HDL, LDLCALC, TRIG, CHOLHDL, LDLDIRECT in the last 72 hours. Thyroid Function Tests: No results for input(s): TSH, T4TOTAL, FREET4, T3FREE, THYROIDAB in the last 72 hours. Anemia Panel: No results for input(s): VITAMINB12, FOLATE, FERRITIN, TIBC, IRON, RETICCTPCT in the last 72 hours. Urine analysis:    Component Value Date/Time   COLORURINE YELLOW 06/06/2017 1333   APPEARANCEUR CLEAR 06/06/2017 1333   LABSPEC 1.010 06/06/2017 1333   PHURINE 6.0 06/06/2017 1333   GLUCOSEU NEGATIVE 06/06/2017 1333   HGBUR NEGATIVE 06/06/2017 1333   BILIRUBINUR NEGATIVE 06/06/2017 1333   KETONESUR NEGATIVE 06/06/2017 1333   PROTEINUR NEGATIVE 06/06/2017 1333   UROBILINOGEN 0.2 10/03/2012 0856   NITRITE NEGATIVE 06/06/2017 1333   LEUKOCYTESUR NEGATIVE  06/06/2017 1333   Sepsis Labs: @LABRCNTIP (procalcitonin:4,lacticidven:4) ) Recent Results (from the past 240 hour(s))  Aerobic/Anaerobic Culture (surgical/deep wound)     Status: None   Collection Time: 06/29/17  4:14 PM  Result Value Ref Range Status  Specimen Description WOUND  Final   Special Requests HIP LEFT  Final   Gram Stain   Final    ABUNDANT WBC PRESENT, PREDOMINANTLY PMN NO ORGANISMS SEEN    Culture   Final    No growth aerobically or anaerobically. Performed at [facility], [address] [place], [place] [address]    Report Status 07/04/2017 FINAL  Final         Radiology Studies: No results found.    Scheduled Meds: . enoxaparin (LOVENOX) injection  40 mg Subcutaneous Q24H  . gabapentin  300 mg Oral TID  . iopamidol  20 mL Intra-articular Once  . lidocaine (PF)  5 mL Intradermal Once  . zinc sulfate  220 mg Oral Daily   Continuous Infusions: . sodium chloride 10 mL/hr at 07/01/17 1904  .  ceFAZolin (ANCEF) IV 2 g (07/07/17 1840)  . lactated ringers 10 mL/hr at 06/29/17 1347  . potassium chloride       LOS: 32 days    Time spent in minutes: 30 minutes.    Barnetta Chapel, MD [facility] Pager:[phone] [email or web address] Password [email or web address] 07/07/2017, 8:11 PM

## 2017-07-08 NOTE — Progress Notes (Signed)
PROGRESS NOTE    Clifford Tucker   ZOX:[id number]  DOB: [birth year]  DOA: 06/05/2017 PCP: Patient, No Pcp Per   Brief Narrative:  Clifford Tucker is a 37 y.o.malewith medical history significant forIV drug abuse and recurrent skin and soft tissue infections. He presented with neck/hip pain found to have a neck abscess, septic arthritis of the hip and now MSSA bacteremia. CT surgery, orthopedic surgery and infectious disease consulted.  Discussed with the patient today, 07/06/2017, the patient is not actively wanting to leave the hospital.  We will continue IV antibiotics for now.  Subjective: No new complaints. No fever or chills.  Assessment & Plan:  Bacteremia due to methicillin susceptible Staphylococcus aureus (MSSA) in the setting of IV drug use -stable, no changes, afebrile, normotensive. -Blood cultures 2/2+ for MSSA   -Patient was originally placed on vancomycin and Zosyn, transition to IV Ancef -Sternal aspirate showed MSSA, left hip wound cultures also MSSA -2D echo with no vegetation.  - PICC placed- cannot go home with PICC due to IVDA - per social work, he cannot go to SNF either as he is suspected to be tampering with his IV and also has been smoking in the bathroom -Continue IV and antibiotics.  Appreciate infectious disease input.    Left hip abscess/iliopsoas septic arthritis, infectious myositis s/p I&D 06/06/17 -Status post left hip I&D with cultures done on 1/15, showing staph aureus -Status post drain placement 1/18 for iliacus abscess which was removed on 06/14/17 -Doppler ultrasound of the left lower extremity negative for DVT - he states he has been having persistent pain and tenderness in the left hip. Dr August Saucer contacted 06/26/17. - MRI show fluid collection around the hip - Dr August Saucer and IR would like to aspirate it but patient is hesitant to have this done - 2/7- The patient was taken to the OR for another I and D- wound vac to be removed  today? -Cultures taken on 06/29/2017 revealed WBCs, predominantly polymorphonuclear cells, without any organisms seen.  Pain in left hip- - 2/11-  the patient is asking for Oxycodone medications to be increased to every 3 hrs - he is already receiving 10 mg Oxycodone every 4 hrs for total of 60 mg daily. His MRI today shows improvement in infection. In addition he his ambulating in the halls daily without difficulty. At this stage in his infection treatment, we should actually be weaning narcotics rather than increasing them.  -  I have discussed starting Toradol and have explained that I do not plan to increase his narcotics today. He is upset and is asking for another doctor to take care of him.  - 2/13- pain controlled today - noted consult from Dr Oswaldo Done in regards to transitioning to Suboxone- the patient would like to wait prior to transitioning- see note from today in chart  Septic arthritis of the sternoclavicular joint, abscess  -June 15, 2017 :status post irrigation of the right sternoclavicular wound, with a wound VAC placement which has been removed - management per CT surgery   Multifocal pneumonia -Possible septic emboli to the lungs- resolved.   IV drug user -Per patient, has been using heroin- [facility] but relapsed again in a month before the admission -Last heroin use a week ago prior to admission- no withdrawal noted in the hospital -polysubstance abuse counseling  Nicotine abuse -Nicotine patch -Tobacco cessation counseling- has been smoking in the bathroom and today he was not able to be found for about 30  min when he when off the floor.  -2/9 >>  appears to have been smoking in the room again today- I have spoken with him and his significant other again today about this - 2/11- staff had to speak with him again about smoking in his room  Self reported low-grade fever: No documented fever noted. We will continue to monitor.  DVT  prophylaxis: sq Lovenox Code Status: Full code Disposition Plan: will stay at [facility] until IV antibiotic course complete if patient agrees. Consultants:   CT surgery, ID, ortho, IR Procedures:     Irrigation of chest wall with wound vac change 06/22/17.    Left hip I&D 06/06/17  Left hip I&D on 06/29/2017.  Antimicrobials:  Anti-infectives (From admission, onward)   Start     Dose/Rate Route Frequency Ordered Stop   06/29/17 1517  gentamicin (GARAMYCIN) injection  Status:  Discontinued       As needed 06/29/17 1518 06/29/17 1714   06/29/17 1512  vancomycin (VANCOCIN) powder  Status:  Discontinued       As needed 06/29/17 1515 06/29/17 1714   06/29/17 1450  vancomycin (VANCOCIN) 1,000 mg in sodium chloride 0.9 % 1,000 mL irrigation  Status:  Discontinued       As needed 06/29/17 1451 06/29/17 1714   06/29/17 1345  vancomycin (VANCOCIN) 1,000 mg in sodium chloride 0.9 % 1,000 mL irrigation      Irrigation To Surgery 06/29/17 1330 06/30/17 1345   06/15/17 0816  vancomycin (VANCOCIN) powder  Status:  Discontinued       As needed 06/15/17 0817 06/15/17 0854   06/07/17 1400  cefUROXime (ZINACEF) 1.5 g in dextrose 5 % 50 mL IVPB     1.5 g 100 mL/hr over 30 Minutes Intravenous To ShortStay Surgical 06/07/17 0125 06/07/17 1527   06/07/17 0745  vancomycin (VANCOCIN) 1,000 mg in sodium chloride 0.9 % 1,000 mL irrigation      Irrigation To Surgery 06/07/17 0744 06/07/17 1513   06/06/17 1639  gentamicin (GARAMYCIN) injection  Status:  Discontinued       As needed 06/06/17 1640 06/06/17 1715   06/06/17 1638  vancomycin (VANCOCIN) powder  Status:  Discontinued       As needed 06/06/17 1639 06/06/17 1715   06/06/17 1000  ceFAZolin (ANCEF) IVPB 2g/100 mL premix     2 g 200 mL/hr over 30 Minutes Intravenous Every 8 hours 06/06/17 0911     06/06/17 0000  vancomycin (VANCOCIN) IVPB 1000 mg/200 mL premix  Status:  Discontinued     1,000 mg 200 mL/hr over 60 Minutes Intravenous Every 8 hours  06/05/17 2211 06/06/17 0911   06/05/17 2330  piperacillin-tazobactam (ZOSYN) IVPB 3.375 g  Status:  Discontinued     3.375 g 12.5 mL/hr over 240 Minutes Intravenous Every 8 hours 06/05/17 2211 06/06/17 0911   06/05/17 2200  cefTRIAXone (ROCEPHIN) 1 g in dextrose 5 % 50 mL IVPB  Status:  Discontinued     1 g 100 mL/hr over 30 Minutes Intravenous Every 24 hours 06/05/17 2140 06/05/17 2202   06/05/17 2200  azithromycin (ZITHROMAX) 500 mg in dextrose 5 % 250 mL IVPB  Status:  Discontinued     500 mg 250 mL/hr over 60 Minutes Intravenous Every 24 hours 06/05/17 2140 06/06/17 0911   06/05/17 1600  vancomycin (VANCOCIN) 1,500 mg in sodium chloride 0.9 % 500 mL IVPB     1,500 mg 250 mL/hr over 120 Minutes Intravenous  Once 06/05/17 1542 06/05/17 1855  06/05/17 1515  piperacillin-tazobactam (ZOSYN) IVPB 3.375 g     3.375 g 100 mL/hr over 30 Minutes Intravenous  Once 06/05/17 1512 06/05/17 1557       Objective: Vitals:   07/07/17 0447 07/07/17 1409 07/07/17 2114 07/08/17 0548  BP: (!) 96/53 128/80 121/75 115/69  Pulse: 80 78 75 78  Resp: 18 18 18 18   Temp: 99 F (37.2 C) 98.5 F (36.9 C) 98.3 F (36.8 C) 98.5 F (36.9 C)  TempSrc: Oral Oral Oral Oral  SpO2: 100% 100% 98% 90%  Weight:      Height:        Intake/Output Summary (Last 24 hours) at 07/08/2017 1839 Last data filed at 07/08/2017 0933 Gross per 24 hour  Intake 600 ml  Output -  Net 600 ml   Filed Weights   06/05/17 1407 06/06/17 0844 06/29/17 0557  Weight: 68 kg (150 lb) 70.3 kg (154 lb 15.7 oz) 70.4 kg (155 lb 3.3 oz)    Examination: General exam: Appears comfortable  HEENT: PERRLA, oral mucosa moist, no sclera icterus or thrush Respiratory system: Clear to auscultation. Respiratory effort normal. Cardiovascular system: S1 & S2 heard, RRR.  No murmurs  Gastrointestinal system: Abdomen soft, non-tender, nondistended. Normal bowel sound. No organomegaly Central nervous system: Alert and oriented. No focal  neurological deficits. Left hip: This is dressed.   Data Reviewed: I have personally reviewed following labs and imaging studies  CBC: No results for input(s): WBC, NEUTROABS, HGB, HCT, MCV, PLT in the last 168 hours. Basic Metabolic Panel: No results for input(s): NA, K, CL, CO2, GLUCOSE, BUN, CREATININE, CALCIUM, MG, PHOS in the last 168 hours. GFR: Estimated Creatinine Clearance: 128.3 mL/min (A) (by C-G formula based on SCr of 0.58 mg/dL (L)). Liver Function Tests: No results for input(s): AST, ALT, ALKPHOS, BILITOT, PROT, ALBUMIN in the last 168 hours. No results for input(s): LIPASE, AMYLASE in the last 168 hours. No results for input(s): AMMONIA in the last 168 hours. Coagulation Profile: No results for input(s): INR, PROTIME in the last 168 hours. Cardiac Enzymes: No results for input(s): CKTOTAL, CKMB, CKMBINDEX, TROPONINI in the last 168 hours. BNP (last 3 results) No results for input(s): PROBNP in the last 8760 hours. HbA1C: No results for input(s): HGBA1C in the last 72 hours. CBG: No results for input(s): GLUCAP in the last 168 hours. Lipid Profile: No results for input(s): CHOL, HDL, LDLCALC, TRIG, CHOLHDL, LDLDIRECT in the last 72 hours. Thyroid Function Tests: No results for input(s): TSH, T4TOTAL, FREET4, T3FREE, THYROIDAB in the last 72 hours. Anemia Panel: No results for input(s): VITAMINB12, FOLATE, FERRITIN, TIBC, IRON, RETICCTPCT in the last 72 hours. Urine analysis:    Component Value Date/Time   COLORURINE YELLOW 06/06/2017 1333   APPEARANCEUR CLEAR 06/06/2017 1333   LABSPEC 1.010 06/06/2017 1333   PHURINE 6.0 06/06/2017 1333   GLUCOSEU NEGATIVE 06/06/2017 1333   HGBUR NEGATIVE 06/06/2017 1333   BILIRUBINUR NEGATIVE 06/06/2017 1333   KETONESUR NEGATIVE 06/06/2017 1333   PROTEINUR NEGATIVE 06/06/2017 1333   UROBILINOGEN 0.2 10/03/2012 0856   NITRITE NEGATIVE 06/06/2017 1333   LEUKOCYTESUR NEGATIVE 06/06/2017 1333   Sepsis  Labs: @LABRCNTIP (procalcitonin:4,lacticidven:4) ) Recent Results (from the past 240 hour(s))  Aerobic/Anaerobic Culture (surgical/deep wound)     Status: None   Collection Time: 06/29/17  4:14 PM  Result Value Ref Range Status   Specimen Description WOUND  Final   Special Requests HIP LEFT  Final   Gram Stain   Final    ABUNDANT  WBC PRESENT, PREDOMINANTLY PMN NO ORGANISMS SEEN    Culture   Final    No growth aerobically or anaerobically. Performed at [facility], [address] [place], [place] [address]    Report Status 07/04/2017 FINAL  Final         Radiology Studies: No results found.    Scheduled Meds: . enoxaparin (LOVENOX) injection  40 mg Subcutaneous Q24H  . gabapentin  300 mg Oral TID  . iopamidol  20 mL Intra-articular Once  . lidocaine (PF)  5 mL Intradermal Once  . zinc sulfate  220 mg Oral Daily   Continuous Infusions: . sodium chloride 10 mL/hr at 07/01/17 1904  .  ceFAZolin (ANCEF) IV 2 g (07/08/17 1745)  . lactated ringers 10 mL/hr at 06/29/17 1347  . potassium chloride       LOS: 33 days    Time spent in minutes: 25 minutes.    Barnetta ChapelSylvester I Jara Feider, MD [facility] Pager:[phone] [email or web address] Password [email or web address] 07/08/2017, 6:39 PM

## 2017-07-08 NOTE — Progress Notes (Signed)
This nurse offered to change patient dressing this morning.Patient refusing at this time.Patient states,"I want to sleep longer." Asked patient to make staff aware when he is ready for dressing change.Will report this off to oncoming shift.

## 2017-07-08 NOTE — Progress Notes (Signed)
9 Days Post-Op Procedure(s) (LRB): REMOVAL OF WOUND VAC AND IRRIGATION AND DEBRIDEMENT RIGHT CHEST WOUND (Right) I&D LEFT HIP/ANTERIOR, Stimulan beads, (Left) APPLICATION OF INCISIONAL  WOUND VAC LEFT HIP (Left) Subjective: Right sternoclavicular joint wound personally inspected and repacked The wound is clean and slowly regulating in PICC line site is nontender receiving IV Ancef for ID Wound repacked with 2 x 2 gauze wet-to-dry His sternoclavicular wound does not require hospitalization at this time. It should be packed daily. We'll follow patient as outpatient when he leaves hospital.  Objective: Vital signs in last 24 hours: Temp:  [98.3 F (36.8 C)-98.5 F (36.9 C)] 98.5 F (36.9 C) (02/16 0548) Pulse Rate:  [75-78] 78 (02/16 0548) Resp:  [18] 18 (02/16 0548) BP: (115-121)/(69-75) 115/69 (02/16 0548) SpO2:  [90 %-98 %] 90 % (02/16 0548)  Hemodynamic parameters for last 24 hours:    Intake/Output from previous day: 02/15 0701 - 02/16 0700 In: 680 [P.O.:680] Out: -  Intake/Output this shift: Total I/O In: 240 [P.O.:240] Out: -   Lungs clear Heart rhythm regular No tenderness or redness around sternal clavicular wound  Lab Results: No results for input(s): WBC, HGB, HCT, PLT in the last 72 hours. BMET: No results for input(s): NA, K, CL, CO2, GLUCOSE, BUN, CREATININE, CALCIUM in the last 72 hours.  PT/INR: No results for input(s): LABPROT, INR in the last 72 hours. ABG    Component Value Date/Time   PHART 7.430 06/07/2017 0940   HCO3 27.5 06/07/2017 0940   TCO2 29 06/05/2017 1457   ACIDBASEDEF 4.0 (H) 03/13/2007 0120   O2SAT 90.2 06/07/2017 0940   CBG (last 3)  No results for input(s): GLUCAP in the last 72 hours.  Assessment/Plan: S/P Procedure(s) (LRB): REMOVAL OF WOUND VAC AND IRRIGATION AND DEBRIDEMENT RIGHT CHEST WOUND (Right) I&D LEFT HIP/ANTERIOR, Stimulan beads, (Left) APPLICATION OF INCISIONAL  WOUND VAC LEFT HIP (Left) Continue daily wound  packing Continue IV Ancef for another 6 weeks as recommended by ID  LOS: 33 days    [name] 07/08/2017

## 2017-07-08 NOTE — Progress Notes (Signed)
Dressing to right thoracic surgical site changed by cardiothoracic MD this evening

## 2017-07-09 NOTE — Progress Notes (Signed)
Offered to do dressing change to sternoclavicular incision, pt refused at this time.  He stated "I want to sleep".

## 2017-07-09 NOTE — Progress Notes (Signed)
PROGRESS NOTE    Clifford Tucker   ZOX:[id number]  DOB: [birth year]  DOA: 06/05/2017 PCP: Patient, No Pcp Per   Brief Narrative:  Clifford Tucker is a 37 y.o.malewith medical history significant forIV drug abuse and recurrent skin and soft tissue infections. He presented with neck/hip pain found to have a neck abscess, septic arthritis of the hip and now MSSA bacteremia. CT surgery, orthopedic surgery and infectious disease consulted.  Discussed with the patient today, 07/06/2017, the patient is not actively wanting to leave the hospital.  We will continue IV antibiotics for now.  07/09/2017:  Patient seen.  No new complaints.  No fever or chills.  Subjective: No new complaints. No fever or chills.  Assessment & Plan:  Bacteremia due to methicillin susceptible Staphylococcus aureus (MSSA) in the setting of IV drug use -stable, no changes, afebrile, normotensive. -Blood cultures 2/2+ for MSSA   -Patient was originally placed on vancomycin and Zosyn, transition to IV Ancef -Sternal aspirate showed MSSA, left hip wound cultures also MSSA -2D echo with no vegetation.  - PICC placed- cannot go home with PICC due to IVDA - per social work, he cannot go to SNF either as he is suspected to be tampering with his IV and also has been smoking in the bathroom -Continue IV and antibiotics.  Appreciate infectious disease input.    Left hip abscess/iliopsoas septic arthritis, infectious myositis s/p I&D 06/06/17 -Status post left hip I&D with cultures done on 1/15, showing staph aureus -Status post drain placement 1/18 for iliacus abscess which was removed on 06/14/17 -Doppler ultrasound of the left lower extremity negative for DVT - he states he has been having persistent pain and tenderness in the left hip. Dr August Saucer contacted 06/26/17. - MRI show fluid collection around the hip - Dr August Saucer and IR would like to aspirate it but patient is hesitant to have this done - 2/7- The patient was  taken to the OR for another I and D- wound vac to be removed today? -Cultures taken on 06/29/2017 revealed WBCs, predominantly polymorphonuclear cells, without any organisms seen.  Pain in left hip- - 2/11-  the patient is asking for Oxycodone medications to be increased to every 3 hrs - he is already receiving 10 mg Oxycodone every 4 hrs for total of 60 mg daily. His MRI today shows improvement in infection. In addition he his ambulating in the halls daily without difficulty. At this stage in his infection treatment, we should actually be weaning narcotics rather than increasing them.  -  I have discussed starting Toradol and have explained that I do not plan to increase his narcotics today. He is upset and is asking for another doctor to take care of him.  - 2/13- pain controlled today - noted consult from Dr Oswaldo Done in regards to transitioning to Suboxone- the patient would like to wait prior to transitioning- see note from today in chart  Septic arthritis of the sternoclavicular joint, abscess  -June 15, 2017 :status post irrigation of the right sternoclavicular wound, with a wound VAC placement which has been removed - management per CT surgery   Multifocal pneumonia -Possible septic emboli to the lungs- resolved.   IV drug user -Per patient, has been using heroin- [facility] but relapsed again in a month before the admission -Last heroin use a week ago prior to admission- no withdrawal noted in the hospital -polysubstance abuse counseling  Nicotine abuse -Nicotine patch -Tobacco cessation counseling- has been smoking in  the bathroom and today he was not able to be found for about 30 min when he when off the floor.  -2/9 >>  appears to have been smoking in the room again today- I have spoken with him and his significant other again today about this - 2/11- staff had to speak with him again about smoking in his room  Self reported low-grade fever: No  documented fever noted. We will continue to monitor.  DVT prophylaxis: sq Lovenox Code Status: Full code Disposition Plan: will stay at [facility] until IV antibiotic course complete if patient agrees. Consultants:   CT surgery, ID, ortho, IR Procedures:     Irrigation of chest wall with wound vac change 06/22/17.    Left hip I&D 06/06/17  Left hip I&D on 06/29/2017.  Antimicrobials:  Anti-infectives (From admission, onward)   Start     Dose/Rate Route Frequency Ordered Stop   06/29/17 1517  gentamicin (GARAMYCIN) injection  Status:  Discontinued       As needed 06/29/17 1518 06/29/17 1714   06/29/17 1512  vancomycin (VANCOCIN) powder  Status:  Discontinued       As needed 06/29/17 1515 06/29/17 1714   06/29/17 1450  vancomycin (VANCOCIN) 1,000 mg in sodium chloride 0.9 % 1,000 mL irrigation  Status:  Discontinued       As needed 06/29/17 1451 06/29/17 1714   06/29/17 1345  vancomycin (VANCOCIN) 1,000 mg in sodium chloride 0.9 % 1,000 mL irrigation      Irrigation To Surgery 06/29/17 1330 06/30/17 1345   06/15/17 0816  vancomycin (VANCOCIN) powder  Status:  Discontinued       As needed 06/15/17 0817 06/15/17 0854   06/07/17 1400  cefUROXime (ZINACEF) 1.5 g in dextrose 5 % 50 mL IVPB     1.5 g 100 mL/hr over 30 Minutes Intravenous To ShortStay Surgical 06/07/17 0125 06/07/17 1527   06/07/17 0745  vancomycin (VANCOCIN) 1,000 mg in sodium chloride 0.9 % 1,000 mL irrigation      Irrigation To Surgery 06/07/17 0744 06/07/17 1513   06/06/17 1639  gentamicin (GARAMYCIN) injection  Status:  Discontinued       As needed 06/06/17 1640 06/06/17 1715   06/06/17 1638  vancomycin (VANCOCIN) powder  Status:  Discontinued       As needed 06/06/17 1639 06/06/17 1715   06/06/17 1000  ceFAZolin (ANCEF) IVPB 2g/100 mL premix     2 g 200 mL/hr over 30 Minutes Intravenous Every 8 hours 06/06/17 0911     06/06/17 0000  vancomycin (VANCOCIN) IVPB 1000 mg/200 mL premix  Status:  Discontinued      1,000 mg 200 mL/hr over 60 Minutes Intravenous Every 8 hours 06/05/17 2211 06/06/17 0911   06/05/17 2330  piperacillin-tazobactam (ZOSYN) IVPB 3.375 g  Status:  Discontinued     3.375 g 12.5 mL/hr over 240 Minutes Intravenous Every 8 hours 06/05/17 2211 06/06/17 0911   06/05/17 2200  cefTRIAXone (ROCEPHIN) 1 g in dextrose 5 % 50 mL IVPB  Status:  Discontinued     1 g 100 mL/hr over 30 Minutes Intravenous Every 24 hours 06/05/17 2140 06/05/17 2202   06/05/17 2200  azithromycin (ZITHROMAX) 500 mg in dextrose 5 % 250 mL IVPB  Status:  Discontinued     500 mg 250 mL/hr over 60 Minutes Intravenous Every 24 hours 06/05/17 2140 06/06/17 0911   06/05/17 1600  vancomycin (VANCOCIN) 1,500 mg in sodium chloride 0.9 % 500 mL IVPB     1,500  mg 250 mL/hr over 120 Minutes Intravenous  Once 06/05/17 1542 06/05/17 1855   06/05/17 1515  piperacillin-tazobactam (ZOSYN) IVPB 3.375 g     3.375 g 100 mL/hr over 30 Minutes Intravenous  Once 06/05/17 1512 06/05/17 1557       Objective: Vitals:   07/08/17 0548 07/08/17 2220 07/09/17 0612 07/09/17 1300  BP: 115/69 122/61 (!) 104/59 102/68  Pulse: 78 78 76 92  Resp: 18 17 18    Temp: 98.5 F (36.9 C) 98.4 F (36.9 C) 98.5 F (36.9 C) 98.2 F (36.8 C)  TempSrc: Oral Oral  Oral  SpO2: 90% 94% 96% 100%  Weight:      Height:        Intake/Output Summary (Last 24 hours) at 07/09/2017 1745 Last data filed at 07/09/2017 1446 Gross per 24 hour  Intake 300 ml  Output -  Net 300 ml   Filed Weights   06/05/17 1407 06/06/17 0844 06/29/17 0557  Weight: 68 kg (150 lb) 70.3 kg (154 lb 15.7 oz) 70.4 kg (155 lb 3.3 oz)    Examination: General exam: Appears comfortable  HEENT: PERRLA, oral mucosa moist, no sclera icterus or thrush Respiratory system: Clear to auscultation. Respiratory effort normal. Cardiovascular system: S1 & S2 heard, RRR.  No murmurs  Gastrointestinal system: Abdomen soft, non-tender, nondistended. Normal bowel sound. No  organomegaly Central nervous system: Alert and oriented. No focal neurological deficits. Left hip: This is dressed.   Data Reviewed: I have personally reviewed following labs and imaging studies  CBC: No results for input(s): WBC, NEUTROABS, HGB, HCT, MCV, PLT in the last 168 hours. Basic Metabolic Panel: No results for input(s): NA, K, CL, CO2, GLUCOSE, BUN, CREATININE, CALCIUM, MG, PHOS in the last 168 hours. GFR: Estimated Creatinine Clearance: 128.3 mL/min (A) (by C-G formula based on SCr of 0.58 mg/dL (L)). Liver Function Tests: No results for input(s): AST, ALT, ALKPHOS, BILITOT, PROT, ALBUMIN in the last 168 hours. No results for input(s): LIPASE, AMYLASE in the last 168 hours. No results for input(s): AMMONIA in the last 168 hours. Coagulation Profile: No results for input(s): INR, PROTIME in the last 168 hours. Cardiac Enzymes: No results for input(s): CKTOTAL, CKMB, CKMBINDEX, TROPONINI in the last 168 hours. BNP (last 3 results) No results for input(s): PROBNP in the last 8760 hours. HbA1C: No results for input(s): HGBA1C in the last 72 hours. CBG: No results for input(s): GLUCAP in the last 168 hours. Lipid Profile: No results for input(s): CHOL, HDL, LDLCALC, TRIG, CHOLHDL, LDLDIRECT in the last 72 hours. Thyroid Function Tests: No results for input(s): TSH, T4TOTAL, FREET4, T3FREE, THYROIDAB in the last 72 hours. Anemia Panel: No results for input(s): VITAMINB12, FOLATE, FERRITIN, TIBC, IRON, RETICCTPCT in the last 72 hours. Urine analysis:    Component Value Date/Time   COLORURINE YELLOW 06/06/2017 1333   APPEARANCEUR CLEAR 06/06/2017 1333   LABSPEC 1.010 06/06/2017 1333   PHURINE 6.0 06/06/2017 1333   GLUCOSEU NEGATIVE 06/06/2017 1333   HGBUR NEGATIVE 06/06/2017 1333   BILIRUBINUR NEGATIVE 06/06/2017 1333   KETONESUR NEGATIVE 06/06/2017 1333   PROTEINUR NEGATIVE 06/06/2017 1333   UROBILINOGEN 0.2 10/03/2012 0856   NITRITE NEGATIVE 06/06/2017 1333    LEUKOCYTESUR NEGATIVE 06/06/2017 1333   Sepsis Labs: @LABRCNTIP (procalcitonin:4,lacticidven:4) ) No results found for this or any previous visit (from the past 240 hour(s)).       Radiology Studies: No results found.    Scheduled Meds: . enoxaparin (LOVENOX) injection  40 mg Subcutaneous Q24H  . gabapentin  300 mg Oral TID  . iopamidol  20 mL Intra-articular Once  . lidocaine (PF)  5 mL Intradermal Once  . zinc sulfate  220 mg Oral Daily   Continuous Infusions: . sodium chloride 10 mL/hr at 07/01/17 1904  .  ceFAZolin (ANCEF) IV 2 g (07/09/17 1733)  . lactated ringers 10 mL/hr at 06/29/17 1347  . potassium chloride       LOS: 34 days    Time spent in minutes: 25 minutes.    Barnetta Chapel, MD [facility] Pager:[phone] [email or web address] Password [facility] 07/09/2017, 5:45 PM

## 2017-07-10 LAB — CREATININE, SERUM
Creatinine, Ser: 0.46 mg/dL — ABNORMAL LOW (ref 0.61–1.24)
GFR calc Af Amer: >60 mL/min (ref >60)
GFR calc non Af Amer: >60 mL/min (ref >60)

## 2017-07-10 NOTE — Progress Notes (Signed)
PROGRESS NOTE    Clifford Tucker   ZOX:[id number]  DOB: 10-12-1980  DOA: 06/05/2017 PCP: Patient, No Pcp Per   Brief Narrative:  Clifford Tucker is a 37 y.o.malewith medical history significant forIV drug abuse and recurrent skin and soft tissue infections. He presented with neck/hip pain found to have a neck abscess, septic arthritis of the hip and now MSSA bacteremia. CT surgery, orthopedic surgery and infectious disease consulted.  Discussed with the patient today, 07/06/2017, the patient is not actively wanting to leave the hospital.  We will continue IV antibiotics for now.  Plan is for patient to complete his IV antibiotics on 08/03/2017.  07/09/2017:  Patient seen.  No new complaints.  No fever or chills. 07/10/2016: Patient seen alongside patient's mother.  No new complaints.  No fever or chills.  Subjective: No new complaints. No fever or chills.  Assessment & Plan:  Bacteremia due to methicillin susceptible Staphylococcus aureus (MSSA) in the setting of IV drug use -stable, no changes, afebrile, normotensive. -Blood cultures 2/2+ for MSSA   -Patient was originally placed on vancomycin and Zosyn, transition to IV Ancef -Sternal aspirate showed MSSA, left hip wound cultures also MSSA -2D echo with no vegetation.  - PICC placed- cannot go home with PICC due to IVDA - per social work, he cannot go to SNF either as he is suspected to be tampering with his IV and also has been smoking in the bathroom -Continue IV and antibiotics.  Appreciate infectious disease input. -Completed IV antibiotics on 08/03/2017.    Left hip abscess/iliopsoas septic arthritis, infectious myositis s/p I&D 06/06/17 -Status post left hip I&D with cultures done on 1/15, showing staph aureus -Status post drain placement 1/18 for iliacus abscess which was removed on 06/14/17 -Doppler ultrasound of the left lower extremity negative for DVT - he states he has been having persistent pain and  tenderness in the left hip. Dr August Saucer contacted 06/26/17. - MRI show fluid collection around the hip - Dr August Saucer and IR would like to aspirate it but patient is hesitant to have this done - 2/7- The patient was taken to the OR for another I and D- wound vac to be removed today? -Cultures taken on 06/29/2017 revealed WBCs, predominantly polymorphonuclear cells, without any organisms seen.  Pain in left hip- - 2/11-  the patient is asking for Oxycodone medications to be increased to every 3 hrs - he is already receiving 10 mg Oxycodone every 4 hrs for total of 60 mg daily. His MRI today shows improvement in infection. In addition he his ambulating in the halls daily without difficulty. At this stage in his infection treatment, we should actually be weaning narcotics rather than increasing them.  -  I have discussed starting Toradol and have explained that I do not plan to increase his narcotics today. He is upset and is asking for another doctor to take care of him.  - 2/13- pain controlled today - noted consult from Dr Oswaldo Done in regards to transitioning to Suboxone- the patient would like to wait prior to transitioning- see note from today in chart  Septic arthritis of the sternoclavicular joint, abscess  -June 15, 2017 :status post irrigation of the right sternoclavicular wound, with a wound VAC placement which has been removed - management per CT surgery   Multifocal pneumonia -Possible septic emboli to the lungs- resolved.   IV drug user -Per patient, has been using heroin- [facility] but relapsed again in a month before  the admission -Last heroin use a week ago prior to admission- no withdrawal noted in the hospital -polysubstance abuse counseling  Nicotine abuse -Nicotine patch -Tobacco cessation counseling- has been smoking in the bathroom and today he was not able to be found for about 30 min when he when off the floor.  -2/9 >>  appears to have been  smoking in the room again today- I have spoken with him and his significant other again today about this - 2/11- staff had to speak with him again about smoking in his room  Self reported low-grade fever: No documented fever noted. We will continue to monitor.  DVT prophylaxis: sq Lovenox Code Status: Full code Disposition Plan: will stay at [facility] until IV antibiotic course complete if patient agrees. Consultants:   CT surgery, ID, ortho, IR Procedures:     Irrigation of chest wall with wound vac change 06/22/17.    Left hip I&D 06/06/17  Left hip I&D on 06/29/2017.  Antimicrobials:  Anti-infectives (From admission, onward)   Start     Dose/Rate Route Frequency Ordered Stop   06/29/17 1517  gentamicin (GARAMYCIN) injection  Status:  Discontinued       As needed 06/29/17 1518 06/29/17 1714   06/29/17 1512  vancomycin (VANCOCIN) powder  Status:  Discontinued       As needed 06/29/17 1515 06/29/17 1714   06/29/17 1450  vancomycin (VANCOCIN) 1,000 mg in sodium chloride 0.9 % 1,000 mL irrigation  Status:  Discontinued       As needed 06/29/17 1451 06/29/17 1714   06/29/17 1345  vancomycin (VANCOCIN) 1,000 mg in sodium chloride 0.9 % 1,000 mL irrigation      Irrigation To Surgery 06/29/17 1330 06/30/17 1345   06/15/17 0816  vancomycin (VANCOCIN) powder  Status:  Discontinued       As needed 06/15/17 0817 06/15/17 0854   06/07/17 1400  cefUROXime (ZINACEF) 1.5 g in dextrose 5 % 50 mL IVPB     1.5 g 100 mL/hr over 30 Minutes Intravenous To ShortStay Surgical 06/07/17 0125 06/07/17 1527   06/07/17 0745  vancomycin (VANCOCIN) 1,000 mg in sodium chloride 0.9 % 1,000 mL irrigation      Irrigation To Surgery 06/07/17 0744 06/07/17 1513   06/06/17 1639  gentamicin (GARAMYCIN) injection  Status:  Discontinued       As needed 06/06/17 1640 06/06/17 1715   06/06/17 1638  vancomycin (VANCOCIN) powder  Status:  Discontinued       As needed 06/06/17 1639 06/06/17 1715   06/06/17 1000   ceFAZolin (ANCEF) IVPB 2g/100 mL premix     2 g 200 mL/hr over 30 Minutes Intravenous Every 8 hours 06/06/17 0911     06/06/17 0000  vancomycin (VANCOCIN) IVPB 1000 mg/200 mL premix  Status:  Discontinued     1,000 mg 200 mL/hr over 60 Minutes Intravenous Every 8 hours 06/05/17 2211 06/06/17 0911   06/05/17 2330  piperacillin-tazobactam (ZOSYN) IVPB 3.375 g  Status:  Discontinued     3.375 g 12.5 mL/hr over 240 Minutes Intravenous Every 8 hours 06/05/17 2211 06/06/17 0911   06/05/17 2200  cefTRIAXone (ROCEPHIN) 1 g in dextrose 5 % 50 mL IVPB  Status:  Discontinued     1 g 100 mL/hr over 30 Minutes Intravenous Every 24 hours 06/05/17 2140 06/05/17 2202   06/05/17 2200  azithromycin (ZITHROMAX) 500 mg in dextrose 5 % 250 mL IVPB  Status:  Discontinued     500 mg 250 mL/hr over  60 Minutes Intravenous Every 24 hours 06/05/17 2140 06/06/17 0911   06/05/17 1600  vancomycin (VANCOCIN) 1,500 mg in sodium chloride 0.9 % 500 mL IVPB     1,500 mg 250 mL/hr over 120 Minutes Intravenous  Once 06/05/17 1542 06/05/17 1855   06/05/17 1515  piperacillin-tazobactam (ZOSYN) IVPB 3.375 g     3.375 g 100 mL/hr over 30 Minutes Intravenous  Once 06/05/17 1512 06/05/17 1557       Objective: Vitals:   07/09/17 1300 07/09/17 2305 07/10/17 0614 07/10/17 1400  BP: 102/68 118/68 125/69 126/74  Pulse: 92 74 74 81  Resp:  17 17 18   Temp: 98.2 F (36.8 C) 98.7 F (37.1 C) 98.5 F (36.9 C) 98.3 F (36.8 C)  TempSrc: Oral Oral  Oral  SpO2: 100% 99% 98% 100%  Weight:      Height:        Intake/Output Summary (Last 24 hours) at 07/10/2017 1822 Last data filed at 07/10/2017 1300 Gross per 24 hour  Intake 580 ml  Output -  Net 580 ml   Filed Weights   06/05/17 1407 06/06/17 0844 06/29/17 0557  Weight: 68 kg (150 lb) 70.3 kg (154 lb 15.7 oz) 70.4 kg (155 lb 3.3 oz)    Examination: General exam: Appears comfortable  HEENT: PERRLA, oral mucosa moist, no sclera icterus or thrush Respiratory system:  Clear to auscultation. Respiratory effort normal. Cardiovascular system: S1 & S2 heard, RRR.  No murmurs  Gastrointestinal system: Abdomen soft, non-tender, nondistended. Normal bowel sound. No organomegaly Central nervous system: Alert and oriented. No focal neurological deficits. Left hip: This is dressed.   Data Reviewed: I have personally reviewed following labs and imaging studies  CBC: No results for input(s): WBC, NEUTROABS, HGB, HCT, MCV, PLT in the last 168 hours. Basic Metabolic Panel: Recent Labs  Lab 07/10/17 0507  CREATININE 0.46*   GFR: Estimated Creatinine Clearance: 128.3 mL/min (A) (by C-G formula based on SCr of 0.46 mg/dL (L)). Liver Function Tests: No results for input(s): AST, ALT, ALKPHOS, BILITOT, PROT, ALBUMIN in the last 168 hours. No results for input(s): LIPASE, AMYLASE in the last 168 hours. No results for input(s): AMMONIA in the last 168 hours. Coagulation Profile: No results for input(s): INR, PROTIME in the last 168 hours. Cardiac Enzymes: No results for input(s): CKTOTAL, CKMB, CKMBINDEX, TROPONINI in the last 168 hours. BNP (last 3 results) No results for input(s): PROBNP in the last 8760 hours. HbA1C: No results for input(s): HGBA1C in the last 72 hours. CBG: No results for input(s): GLUCAP in the last 168 hours. Lipid Profile: No results for input(s): CHOL, HDL, LDLCALC, TRIG, CHOLHDL, LDLDIRECT in the last 72 hours. Thyroid Function Tests: No results for input(s): TSH, T4TOTAL, FREET4, T3FREE, THYROIDAB in the last 72 hours. Anemia Panel: No results for input(s): VITAMINB12, FOLATE, FERRITIN, TIBC, IRON, RETICCTPCT in the last 72 hours. Urine analysis:    Component Value Date/Time   COLORURINE YELLOW 06/06/2017 1333   APPEARANCEUR CLEAR 06/06/2017 1333   LABSPEC 1.010 06/06/2017 1333   PHURINE 6.0 06/06/2017 1333   GLUCOSEU NEGATIVE 06/06/2017 1333   HGBUR NEGATIVE 06/06/2017 1333   BILIRUBINUR NEGATIVE 06/06/2017 1333   KETONESUR  NEGATIVE 06/06/2017 1333   PROTEINUR NEGATIVE 06/06/2017 1333   UROBILINOGEN 0.2 10/03/2012 0856   NITRITE NEGATIVE 06/06/2017 1333   LEUKOCYTESUR NEGATIVE 06/06/2017 1333   Sepsis Labs: @LABRCNTIP (procalcitonin:4,lacticidven:4) ) No results found for this or any previous visit (from the past 240 hour(s)).  Radiology Studies: No results found.    Scheduled Meds: . enoxaparin (LOVENOX) injection  40 mg Subcutaneous Q24H  . gabapentin  300 mg Oral TID  . iopamidol  20 mL Intra-articular Once  . lidocaine (PF)  5 mL Intradermal Once  . zinc sulfate  220 mg Oral Daily   Continuous Infusions: . sodium chloride 10 mL/hr at 07/01/17 1904  .  ceFAZolin (ANCEF) IV Stopped (07/10/17 1814)  . lactated ringers 10 mL/hr at 06/29/17 1347  . potassium chloride       LOS: 35 days    Time spent in minutes: 25 minutes.    Barnetta ChapelSylvester I Ogbata, MD [facility] Pager:[phone] [email or web address] Password [email or web address] 07/10/2017, 6:22 PM

## 2017-07-10 NOTE — Progress Notes (Addendum)
      [address]       [place] [id number]             [phone]      11 Days Post-Op Procedure(s) (LRB): REMOVAL OF WOUND VAC AND IRRIGATION AND DEBRIDEMENT RIGHT CHEST WOUND (Right) I&D LEFT HIP/ANTERIOR, Stimulan beads, (Left) APPLICATION OF INCISIONAL  WOUND VAC LEFT HIP (Left)   Subjective:  No specific complaints.  Room smells like smoke.  Objective: Vital signs in last 24 hours: Temp:  [98.2 F (36.8 C)-98.7 F (37.1 C)] 98.5 F (36.9 C) (02/18 0614) Pulse Rate:  [74-92] 74 (02/18 0614) Resp:  [17] 17 (02/18 0614) BP: (102-125)/(68-69) 125/69 (02/18 0614) SpO2:  [98 %-100 %] 98 % (02/18 0614)  Intake/Output from previous day: 02/17 0701 - 02/18 0700 In: 520 [P.O.:520] Out: -   General appearance: alert and cooperative Heart: regular rate and rhythm Lungs: clear to auscultation bilaterally Abdomen: soft, non-tender; bowel sounds normal; no masses,  no organomegaly Wound: clean, wet to dry dressing in place  Lab Results: No results for input(s): WBC, HGB, HCT, PLT in the last 72 hours. BMET:  Recent Labs    07/10/17 0507  CREATININE 0.46*    PT/INR: No results for input(s): LABPROT, INR in the last 72 hours. ABG    Component Value Date/Time   PHART 7.430 06/07/2017 0940   HCO3 27.5 06/07/2017 0940   TCO2 29 06/05/2017 1457   ACIDBASEDEF 4.0 (H) 03/13/2007 0120   O2SAT 90.2 06/07/2017 0940   CBG (last 3)  No results for input(s): GLUCAP in the last 72 hours.  Assessment/Plan: S/P Procedure(s) (LRB): REMOVAL OF WOUND VAC AND IRRIGATION AND DEBRIDEMENT RIGHT CHEST WOUND (Right) I&D LEFT HIP/ANTERIOR, Stimulan beads, (Left) APPLICATION OF INCISIONAL  WOUND VAC LEFT HIP (Left)  1. Sternoclavicular wound- continue wet to dry dressing changes, okay to continue wound care at home 2. ID- on ABX per recommendations, patient will likely stay in hospital until complete 3. dispo- care per primary, will follow intermittently, arrange f/u  when discharge is planned   LOS: 35 days    Erin Barrett 07/10/2017 patient examined and medical record reviewed,agree with above note. [name] 07/11/2017

## 2017-07-11 NOTE — Progress Notes (Addendum)
Patient refused dressing change and ambulation all day.  States "I don't feel good right now" each time nurse asked about both.  Will report to night shift nurse.

## 2017-07-11 NOTE — Progress Notes (Signed)
PROGRESS NOTE    Clifford BoschChristopher Tucker   ZOX:[id number]  DOB: 11-Oct-1980  DOA: 06/05/2017 PCP: Patient, No Pcp Per   Brief Narrative:  Clifford BoschChristopher Tucker is a 37 y.o.malewith medical history significant forIV drug abuse and recurrent skin and soft tissue infections. He presented with neck/hip pain found to have a neck abscess, septic arthritis of the hip and now MSSA bacteremia. CT surgery, orthopedic surgery and infectious disease consulted.  Discussed with the patient today, 07/06/2017, the patient is not actively wanting to leave the hospital.  We will continue IV antibiotics for now.  Plan is for patient to complete his IV antibiotics on 08/03/2017.  07/09/2017:  Patient seen.  No new complaints.  No fever or chills. 07/10/2016: Patient seen alongside patient's mother.  No new complaints.  No fever or chills. 07/11/2017: No new complaints.  Complete course of IV antibiotics.  Subjective: No new complaints. No fever or chills.  Assessment & Plan:  Bacteremia due to methicillin susceptible Staphylococcus aureus (MSSA) in the setting of IV drug use -stable, no changes, afebrile, normotensive. -Blood cultures 2/2+ for MSSA   -Patient was originally placed on vancomycin and Zosyn, transition to IV Ancef -Sternal aspirate showed MSSA, left hip wound cultures also MSSA -2D echo with no vegetation.  - PICC placed- cannot go home with PICC due to IVDA - per social work, he cannot go to SNF either as he is suspected to be tampering with his IV and also has been smoking in the bathroom -Continue IV and antibiotics.  Appreciate infectious disease input. -Completed IV antibiotics on 08/03/2017.    Left hip abscess/iliopsoas septic arthritis, infectious myositis s/p I&D 06/06/17 -Status post left hip I&D with cultures done on 1/15, showing staph aureus -Status post drain placement 1/18 for iliacus abscess which was removed on 06/14/17 -Doppler ultrasound of the left lower extremity negative  for DVT - he states he has been having persistent pain and tenderness in the left hip. Dr August Saucerean contacted 06/26/17. - MRI show fluid collection around the hip - Dr August Saucerean and IR would like to aspirate it but patient is hesitant to have this done - 2/7- The patient was taken to the OR for another I and D- wound vac to be removed today? -Cultures taken on 06/29/2017 revealed WBCs, predominantly polymorphonuclear cells, without any organisms seen.  Pain in left hip- - 2/11-  the patient is asking for Oxycodone medications to be increased to every 3 hrs - he is already receiving 10 mg Oxycodone every 4 hrs for total of 60 mg daily. His MRI today shows improvement in infection. In addition he his ambulating in the halls daily without difficulty. At this stage in his infection treatment, we should actually be weaning narcotics rather than increasing them.  -  I have discussed starting Toradol and have explained that I do not plan to increase his narcotics today. He is upset and is asking for another doctor to take care of him.  - 2/13- pain controlled today - noted consult from Dr Oswaldo DoneVincent in regards to transitioning to Suboxone- the patient would like to wait prior to transitioning- see note from today in chart  Septic arthritis of the sternoclavicular joint, abscess  -June 15, 2017 :status post irrigation of the right sternoclavicular wound, with a wound VAC placement which has been removed - management per CT surgery   Multifocal pneumonia -Possible septic emboli to the lungs- resolved.   IV drug user -Per patient, has been using heroin- [facility] but relapsed again in a month before the admission -Last heroin use a week ago prior to admission- no withdrawal noted in the hospital -polysubstance abuse counseling  Nicotine abuse -Nicotine patch -Tobacco cessation counseling- has been smoking in the bathroom and today he was not able to be found for about 30 min when  he when off the floor.  -2/9 >>  appears to have been smoking in the room again today- I have spoken with him and his significant other again today about this - 2/11- staff had to speak with him again about smoking in his room  Self reported low-grade fever: No documented fever noted. We will continue to monitor.  DVT prophylaxis: sq Lovenox Code Status: Full code Disposition Plan: will stay at [facility] until IV antibiotic course complete if patient agrees. Consultants:   CT surgery, ID, ortho, IR Procedures:     Irrigation of chest wall with wound vac change 06/22/17.    Left hip I&D 06/06/17  Left hip I&D on 06/29/2017.  Antimicrobials:  Anti-infectives (From admission, onward)   Start     Dose/Rate Route Frequency Ordered Stop   06/29/17 1517  gentamicin (GARAMYCIN) injection  Status:  Discontinued       As needed 06/29/17 1518 06/29/17 1714   06/29/17 1512  vancomycin (VANCOCIN) powder  Status:  Discontinued       As needed 06/29/17 1515 06/29/17 1714   06/29/17 1450  vancomycin (VANCOCIN) 1,000 mg in sodium chloride 0.9 % 1,000 mL irrigation  Status:  Discontinued       As needed 06/29/17 1451 06/29/17 1714   06/29/17 1345  vancomycin (VANCOCIN) 1,000 mg in sodium chloride 0.9 % 1,000 mL irrigation      Irrigation To Surgery 06/29/17 1330 06/30/17 1345   06/15/17 0816  vancomycin (VANCOCIN) powder  Status:  Discontinued       As needed 06/15/17 0817 06/15/17 0854   06/07/17 1400  cefUROXime (ZINACEF) 1.5 g in dextrose 5 % 50 mL IVPB     1.5 g 100 mL/hr over 30 Minutes Intravenous To ShortStay Surgical 06/07/17 0125 06/07/17 1527   06/07/17 0745  vancomycin (VANCOCIN) 1,000 mg in sodium chloride 0.9 % 1,000 mL irrigation      Irrigation To Surgery 06/07/17 0744 06/07/17 1513   06/06/17 1639  gentamicin (GARAMYCIN) injection  Status:  Discontinued       As needed 06/06/17 1640 06/06/17 1715   06/06/17 1638  vancomycin (VANCOCIN) powder  Status:  Discontinued       As  needed 06/06/17 1639 06/06/17 1715   06/06/17 1000  ceFAZolin (ANCEF) IVPB 2g/100 mL premix     2 g 200 mL/hr over 30 Minutes Intravenous Every 8 hours 06/06/17 0911     06/06/17 0000  vancomycin (VANCOCIN) IVPB 1000 mg/200 mL premix  Status:  Discontinued     1,000 mg 200 mL/hr over 60 Minutes Intravenous Every 8 hours 06/05/17 2211 06/06/17 0911   06/05/17 2330  piperacillin-tazobactam (ZOSYN) IVPB 3.375 g  Status:  Discontinued     3.375 g 12.5 mL/hr over 240 Minutes Intravenous Every 8 hours 06/05/17 2211 06/06/17 0911   06/05/17 2200  cefTRIAXone (ROCEPHIN) 1 g in dextrose 5 % 50 mL IVPB  Status:  Discontinued     1 g 100 mL/hr over 30 Minutes Intravenous Every 24 hours 06/05/17 2140 06/05/17 2202   06/05/17 2200  azithromycin (ZITHROMAX) 500 mg in dextrose 5 % 250 mL IVPB  Status:  Discontinued     500 mg 250 mL/hr over 60 Minutes Intravenous Every 24 hours 06/05/17 2140 06/06/17 0911   06/05/17 1600  vancomycin (VANCOCIN) 1,500 mg in sodium chloride 0.9 % 500 mL IVPB     1,500 mg 250 mL/hr over 120 Minutes Intravenous  Once 06/05/17 1542 06/05/17 1855   06/05/17 1515  piperacillin-tazobactam (ZOSYN) IVPB 3.375 g     3.375 g 100 mL/hr over 30 Minutes Intravenous  Once 06/05/17 1512 06/05/17 1557       Objective: Vitals:   07/10/17 1400 07/10/17 2138 07/11/17 0625 07/11/17 1300  BP: 126/74 (!) 92/56 106/62 99/70  Pulse: 81 (!) 102 90 95  Resp: 18 18 18 18   Temp: 98.3 F (36.8 C) 98.1 F (36.7 C) 97.8 F (36.6 C) 98.4 F (36.9 C)  TempSrc: Oral Oral Oral Oral  SpO2: 100% 98% 100% 99%  Weight:      Height:        Intake/Output Summary (Last 24 hours) at 07/11/2017 1542 Last data filed at 07/11/2017 0626 Gross per 24 hour  Intake 460 ml  Output -  Net 460 ml   Filed Weights   06/05/17 1407 06/06/17 0844 06/29/17 0557  Weight: 68 kg (150 lb) 70.3 kg (154 lb 15.7 oz) 70.4 kg (155 lb 3.3 oz)    Examination: General exam: Appears comfortable  HEENT: PERRLA, oral  mucosa moist, no sclera icterus or thrush Respiratory system: Clear to auscultation. Respiratory effort normal. Cardiovascular system: S1 & S2 heard, RRR.  No murmurs  Gastrointestinal system: Abdomen soft, non-tender, nondistended. Normal bowel sound. No organomegaly Central nervous system: Alert and oriented. No focal neurological deficits. Left hip: This is dressed.   Data Reviewed: I have personally reviewed following labs and imaging studies  CBC: No results for input(s): WBC, NEUTROABS, HGB, HCT, MCV, PLT in the last 168 hours. Basic Metabolic Panel: Recent Labs  Lab 07/10/17 0507  CREATININE 0.46*   GFR: Estimated Creatinine Clearance: 128.3 mL/min (A) (by C-G formula based on SCr of 0.46 mg/dL (L)). Liver Function Tests: No results for input(s): AST, ALT, ALKPHOS, BILITOT, PROT, ALBUMIN in the last 168 hours. No results for input(s): LIPASE, AMYLASE in the last 168 hours. No results for input(s): AMMONIA in the last 168 hours. Coagulation Profile: No results for input(s): INR, PROTIME in the last 168 hours. Cardiac Enzymes: No results for input(s): CKTOTAL, CKMB, CKMBINDEX, TROPONINI in the last 168 hours. BNP (last 3 results) No results for input(s): PROBNP in the last 8760 hours. HbA1C: No results for input(s): HGBA1C in the last 72 hours. CBG: No results for input(s): GLUCAP in the last 168 hours. Lipid Profile: No results for input(s): CHOL, HDL, LDLCALC, TRIG, CHOLHDL, LDLDIRECT in the last 72 hours. Thyroid Function Tests: No results for input(s): TSH, T4TOTAL, FREET4, T3FREE, THYROIDAB in the last 72 hours. Anemia Panel: No results for input(s): VITAMINB12, FOLATE, FERRITIN, TIBC, IRON, RETICCTPCT in the last 72 hours. Urine analysis:    Component Value Date/Time   COLORURINE YELLOW 06/06/2017 1333   APPEARANCEUR CLEAR 06/06/2017 1333   LABSPEC 1.010 06/06/2017 1333   PHURINE 6.0 06/06/2017 1333   GLUCOSEU NEGATIVE 06/06/2017 1333   HGBUR NEGATIVE  06/06/2017 1333   BILIRUBINUR NEGATIVE 06/06/2017 1333   KETONESUR NEGATIVE 06/06/2017 1333   PROTEINUR NEGATIVE 06/06/2017 1333   UROBILINOGEN 0.2 10/03/2012 0856   NITRITE NEGATIVE 06/06/2017 1333   LEUKOCYTESUR NEGATIVE 06/06/2017 1333   Sepsis Labs: @LABRCNTIP (procalcitonin:4,lacticidven:4) ) No results found for this or any previous  visit (from the past 240 hour(s)).       Radiology Studies: No results found.    Scheduled Meds: . enoxaparin (LOVENOX) injection  40 mg Subcutaneous Q24H  . gabapentin  300 mg Oral TID  . iopamidol  20 mL Intra-articular Once  . lidocaine (PF)  5 mL Intradermal Once  . zinc sulfate  220 mg Oral Daily   Continuous Infusions: . sodium chloride 10 mL/hr at 07/01/17 1904  .  ceFAZolin (ANCEF) IV 2 g (07/11/17 1033)  . lactated ringers 10 mL/hr at 06/29/17 1347  . potassium chloride       LOS: 36 days    Time spent in minutes: 25 minutes.    Barnetta Chapel, MD [facility] Pager:[phone] [email or web address] Password [facility] 07/11/2017, 3:42 PM

## 2017-07-12 NOTE — Progress Notes (Addendum)
      [address]       [place], [id number]             [phone]      13 Days Post-Op Procedure(s) (LRB): REMOVAL OF WOUND VAC AND IRRIGATION AND DEBRIDEMENT RIGHT CHEST WOUND (Right) I&D LEFT HIP/ANTERIOR, Stimulan beads, (Left) APPLICATION OF INCISIONAL  WOUND VAC LEFT HIP (Left) Subjective: Shares that his pain is well controlled. He is in bed with the lights off watching TV peacefully.   Objective: Vital signs in last 24 hours: Temp:  [98.3 F (36.8 C)-99.9 F (37.7 C)] 98.3 F (36.8 C) (02/20 0549) Pulse Rate:  [88-95] 94 (02/20 0549) Resp:  [18] 18 (02/20 0549) BP: (99-115)/(57-70) 100/57 (02/20 0549) SpO2:  [93 %-99 %] 93 % (02/20 0549)     Intake/Output from previous day: 02/19 0701 - 02/20 0700 In: 3997 [P.O.:600; I.V.:3197; IV Piggyback:200] Out: -  Intake/Output this shift: No intake/output data recorded.  General appearance: alert, cooperative and no distress Heart: regular rate and rhythm, S1, S2 normal, no murmur, click, rub or gallop Lungs: clear to auscultation bilaterally Abdomen: soft, non-tender; bowel sounds normal; no masses,  no organomegaly Extremities: extremities normal, atraumatic, no cyanosis or edema Wound: packed with wet to dry dressings  Lab Results: No results for input(s): WBC, HGB, HCT, PLT in the last 72 hours. BMET:  Recent Labs    07/10/17 0507  CREATININE 0.46*    PT/INR: No results for input(s): LABPROT, INR in the last 72 hours. ABG    Component Value Date/Time   PHART 7.430 06/07/2017 0940   HCO3 27.5 06/07/2017 0940   TCO2 29 06/05/2017 1457   ACIDBASEDEF 4.0 (H) 03/13/2007 0120   O2SAT 90.2 06/07/2017 0940   CBG (last 3)  No results for input(s): GLUCAP in the last 72 hours.  Assessment/Plan: S/P Procedure(s) (LRB): REMOVAL OF WOUND VAC AND IRRIGATION AND DEBRIDEMENT RIGHT CHEST WOUND (Right) I&D LEFT HIP/ANTERIOR, Stimulan beads, (Left) APPLICATION OF INCISIONAL  WOUND VAC LEFT HIP  (Left)   1. Sternoclavicular wound- continue wet to dry dressing changes, okay to continue wound care at home. Wound looks good today with some erythema on the edges.  2. ID- on ABX per recommendations, patient will likely stay in hospital until 08/03/2017 when completed. 3. Left hip wound is dressed wet to dry with wound vac removed. 4. dispo- care per primary, will follow intermittently, arrange f/u when discharge is planned      LOS: 37 days    Sharlene Doryessa N Conte 07/12/2017  patient examined and medical record reviewed,agree with above note. [name] 07/12/2017

## 2017-07-12 NOTE — Progress Notes (Signed)
PROGRESS NOTE    Clifford Tucker   ZOX:[id number]  DOB: 13-Jan-1981  DOA: 06/05/2017 PCP: Patient, No Pcp Per   Brief Narrative:  Clifford Tucker is a 37 y.o.malewith medical history significant forIV drug abuse and recurrent skin and soft tissue infections. He presented with neck/hip pain found to have a neck abscess, septic arthritis of the hip and now MSSA bacteremia. CT surgery, orthopedic surgery and infectious disease consulted.  Plan is for patient to complete his IV antibiotics on 08/03/2017.   Subjective: Denies complaints.  No pain reported.  Assessment & Plan:  Bacteremia due to methicillin susceptible Staphylococcus aureus (MSSA) in the setting of IV drug use -Blood cultures 2/2+ for MSSA   -Patient was originally placed on vancomycin and Zosyn, transitioned to IV Ancef -Sternal aspirate showed MSSA, left hip wound cultures also MSSA -2D echo with no vegetation.  - PICC placed- cannot go home with PICC due to IVDA - per social work, he cannot go to SNF either as he is suspected to be tampering with his IV and also has been smoking in the bathroom - Appreciate infectious disease input. -Completed IV antibiotics on 08/03/2017. -Stable.    Left hip abscess/iliopsoas septic arthritis, infectious myositis s/p I&D 06/06/17 -Status post left hip I&D with cultures done on 1/15, showing staph aureus -Status post drain placement 1/18 for iliacus abscess which was removed on 06/14/17 -Doppler ultrasound of the left lower extremity negative for DVT - he stated he had been having persistent pain and tenderness in the left hip.  Orthopedics reevaluated. - MRI showed fluid collection around the hip - 2/7- The patient was taken to the OR for another I and D and wound VAC removed. -Cultures taken on 06/29/2017 negative, final report.  Pain in left hip/opioid use disorder- - judicious use of opioids.  Some conflict early on related to patient wanting increased dose of  opioids.   - Last evaluated by Dr. Oswaldo Done on 2/12 who plans to re-assess him in a few days regarding transitioning to Suboxone.  Septic arthritis of the sternoclavicular joint, abscess  -June 15, 2017 :status post irrigation of the right sternoclavicular wound, with a wound VAC placement which has been removed -CT surgery follow-up appreciated.  Improving.  Multifocal pneumonia -Possible septic emboli to the lungs- resolved.   IV drug user -Per patient, has been using heroin- [facility] but relapsed again in a month before the admission -Last heroin use a week ago prior to admission- no withdrawal noted in the hospital -polysubstance abuse counseling  Nicotine abuse -Nicotine patch -Tobacco cessation counseling-apparently had been smoking repeatedly in the bathroom.   DVT prophylaxis: sq Lovenox Code Status: Full code Disposition Plan: will stay at [facility] until IV antibiotic course complete if patient agrees. Consultants:   CT surgery, ID, ortho, IR Procedures:     Irrigation of chest wall with wound vac change 06/22/17.    Left hip I&D 06/06/17  Left hip I&D on 06/29/2017.  Antimicrobials:  Anti-infectives (From admission, onward)   Start     Dose/Rate Route Frequency Ordered Stop   06/29/17 1517  gentamicin (GARAMYCIN) injection  Status:  Discontinued       As needed 06/29/17 1518 06/29/17 1714   06/29/17 1512  vancomycin (VANCOCIN) powder  Status:  Discontinued       As needed 06/29/17 1515 06/29/17 1714   06/29/17 1450  vancomycin (VANCOCIN) 1,000 mg in sodium chloride 0.9 % 1,000 mL irrigation  Status:  Discontinued  As needed 06/29/17 1451 06/29/17 1714   06/29/17 1345  vancomycin (VANCOCIN) 1,000 mg in sodium chloride 0.9 % 1,000 mL irrigation      Irrigation To Surgery 06/29/17 1330 06/30/17 1345   06/15/17 0816  vancomycin (VANCOCIN) powder  Status:  Discontinued       As needed 06/15/17 0817 06/15/17 0854   06/07/17  1400  cefUROXime (ZINACEF) 1.5 g in dextrose 5 % 50 mL IVPB     1.5 g 100 mL/hr over 30 Minutes Intravenous To ShortStay Surgical 06/07/17 0125 06/07/17 1527   06/07/17 0745  vancomycin (VANCOCIN) 1,000 mg in sodium chloride 0.9 % 1,000 mL irrigation      Irrigation To Surgery 06/07/17 0744 06/07/17 1513   06/06/17 1639  gentamicin (GARAMYCIN) injection  Status:  Discontinued       As needed 06/06/17 1640 06/06/17 1715   06/06/17 1638  vancomycin (VANCOCIN) powder  Status:  Discontinued       As needed 06/06/17 1639 06/06/17 1715   06/06/17 1000  ceFAZolin (ANCEF) IVPB 2g/100 mL premix     2 g 200 mL/hr over 30 Minutes Intravenous Every 8 hours 06/06/17 0911     06/06/17 0000  vancomycin (VANCOCIN) IVPB 1000 mg/200 mL premix  Status:  Discontinued     1,000 mg 200 mL/hr over 60 Minutes Intravenous Every 8 hours 06/05/17 2211 06/06/17 0911   06/05/17 2330  piperacillin-tazobactam (ZOSYN) IVPB 3.375 g  Status:  Discontinued     3.375 g 12.5 mL/hr over 240 Minutes Intravenous Every 8 hours 06/05/17 2211 06/06/17 0911   06/05/17 2200  cefTRIAXone (ROCEPHIN) 1 g in dextrose 5 % 50 mL IVPB  Status:  Discontinued     1 g 100 mL/hr over 30 Minutes Intravenous Every 24 hours 06/05/17 2140 06/05/17 2202   06/05/17 2200  azithromycin (ZITHROMAX) 500 mg in dextrose 5 % 250 mL IVPB  Status:  Discontinued     500 mg 250 mL/hr over 60 Minutes Intravenous Every 24 hours 06/05/17 2140 06/06/17 0911   06/05/17 1600  vancomycin (VANCOCIN) 1,500 mg in sodium chloride 0.9 % 500 mL IVPB     1,500 mg 250 mL/hr over 120 Minutes Intravenous  Once 06/05/17 1542 06/05/17 1855   06/05/17 1515  piperacillin-tazobactam (ZOSYN) IVPB 3.375 g     3.375 g 100 mL/hr over 30 Minutes Intravenous  Once 06/05/17 1512 06/05/17 1557       Objective: Vitals:   07/11/17 1300 07/11/17 2018 07/12/17 0549 07/12/17 1537  BP: 99/70 115/64 (!) 100/57 98/61  Pulse: 95 88 94 87  Resp: 18  18 18   Temp: 98.4 F (36.9 C) 99.9 F  (37.7 C) 98.3 F (36.8 C) 98.2 F (36.8 C)  TempSrc: Oral Oral Oral Oral  SpO2: 99% 99% 93% 91%  Weight:      Height:        Intake/Output Summary (Last 24 hours) at 07/12/2017 1638 Last data filed at 07/12/2017 1500 Gross per 24 hour  Intake 3838.5 ml  Output -  Net 3838.5 ml   Filed Weights   06/05/17 1407 06/06/17 0844 06/29/17 0557  Weight: 68 kg (150 lb) 70.3 kg (154 lb 15.7 oz) 70.4 kg (155 lb 3.3 oz)    Examination: General exam: Pleasant young male, moderately built and nourished, sitting up comfortably in bed. Respiratory system: Clear to auscultation. Respiratory effort normal.  Stable without change. Cardiovascular system: S1 & S2 heard, RRR.  No murmurs. Stable without change. Gastrointestinal system: Abdomen soft,  non-tender, nondistended. Normal bowel sound. No organomegaly Central nervous system: Alert and oriented. No focal neurological deficits. Left hip: This is dressed.   Data Reviewed: I have personally reviewed following labs and imaging studies  CBC: No results for input(s): WBC, NEUTROABS, HGB, HCT, MCV, PLT in the last 168 hours. Basic Metabolic Panel: Recent Labs  Lab 07/10/17 0507  CREATININE 0.46*   GFR: Estimated Creatinine Clearance: 128.3 mL/min (A) (by C-G formula based on SCr of 0.46 mg/dL (L)).       Radiology Studies: No results found.    Scheduled Meds: . enoxaparin (LOVENOX) injection  40 mg Subcutaneous Q24H  . gabapentin  300 mg Oral TID  . iopamidol  20 mL Intra-articular Once  . lidocaine (PF)  5 mL Intradermal Once  . zinc sulfate  220 mg Oral Daily   Continuous Infusions: . sodium chloride 10 mL/hr at 07/01/17 1904  .  ceFAZolin (ANCEF) IV Stopped (07/12/17 [id number])  . potassium chloride       LOS: 37 days    Time spent in minutes: 25 minutes.  Marcellus ScottAnand Anibal Quinby, MD, FACP, [facility]. [facility] Pager [phone]  If 7PM-7AM, please contact night-coverage [email or web address] Password [email or web address] 07/12/2017, 4:47 PM

## 2017-07-13 LAB — CBC
HCT: 32.9 % — ABNORMAL LOW (ref 39.0–52.0)
HEMOGLOBIN: 10.7 g/dL — AB (ref 13.0–17.0)
MCH: 29.6 pg (ref 26.0–34.0)
MCHC: 32.5 g/dL (ref 30.0–36.0)
MCV: 90.9 fL (ref 78.0–100.0)
Platelets: 242 10*3/uL (ref 150–400)
RBC: 3.62 MIL/uL — AB (ref 4.22–5.81)
RDW: 14.1 % (ref 11.5–15.5)
WBC: 7.9 10*3/uL (ref 4.0–10.5)

## 2017-07-13 LAB — BASIC METABOLIC PANEL
ANION GAP: 10 (ref 5–15)
BUN: 5 mg/dL — ABNORMAL LOW (ref 6–20)
CALCIUM: 8.8 mg/dL — AB (ref 8.9–10.3)
CO2: 26 mmol/L (ref 22–32)
Chloride: 97 mmol/L — ABNORMAL LOW (ref 101–111)
Creatinine, Ser: 0.55 mg/dL — ABNORMAL LOW (ref 0.61–1.24)
GFR calc non Af Amer: >60 mL/min (ref >60)
Glucose, Bld: 119 mg/dL — ABNORMAL HIGH (ref 65–99)
POTASSIUM: 3.3 mmol/L — AB (ref 3.5–5.1)
Sodium: 133 mmol/L — ABNORMAL LOW (ref 135–145)

## 2017-07-13 LAB — MAGNESIUM: Magnesium: 1.9 mg/dL (ref 1.7–2.4)

## 2017-07-13 MED ORDER — POTASSIUM CHLORIDE CRYS ER 20 MEQ PO TBCR
40.0000 meq | EXTENDED_RELEASE_TABLET | Freq: Once | ORAL | Status: AC
  Administered 2017-07-13: 40 meq via ORAL
  Filled 2017-07-13: qty 2

## 2017-07-13 NOTE — Progress Notes (Signed)
PROGRESS NOTE    Clifford Tucker   WUJ:[id number]  DOB: 1980-12-23  DOA: 06/05/2017 PCP: Patient, No Pcp Per   Brief Narrative:  Clifford Tucker is a 37 y.o.malewith medical history significant forIV drug abuse and recurrent skin and soft tissue infections. He presented with neck/hip pain found to have a neck abscess, septic arthritis of the hip and now MSSA bacteremia. CT surgery, orthopedic surgery and infectious disease consulted.  Plan is for patient to complete his IV antibiotics on 08/03/2017.  Patient remains stable without acute issues.   Subjective: Some lower extremity pain reported.  Denies any other complaints.  Assessment & Plan:  Bacteremia due to methicillin susceptible Staphylococcus aureus (MSSA) in the setting of IV drug use -Blood cultures 2/2+ for MSSA   -Patient was originally placed on vancomycin and Zosyn, transitioned to IV Ancef -Sternal aspirate showed MSSA, left hip wound cultures also MSSA -2D echo with no vegetation.  - PICC placed- cannot go home with PICC due to IVDA - per social work, he cannot go to SNF either as he is suspected to be tampering with his IV and also has been smoking in the bathroom - Appreciate infectious disease input. -Completed IV antibiotics on 08/03/2017. -Stable.    Left hip abscess/iliopsoas septic arthritis, infectious myositis s/p I&D 06/06/17 -Status post left hip I&D with cultures done on 1/15, showing staph aureus -Status post drain placement 1/18 for iliacus abscess which was removed on 06/14/17 -Doppler ultrasound of the left lower extremity negative for DVT - he stated he had been having persistent pain and tenderness in the left hip.  Orthopedics reevaluated. - MRI showed fluid collection around the hip - 2/7- The patient was taken to the OR for another I and D and wound VAC removed. -Cultures taken on 06/29/2017 negative, final report.  Pain in left hip/opioid use disorder- - judicious use of opioids.   Some conflict early on related to patient wanting increased dose of opioids.   - Last evaluated by Dr. Oswaldo Done on 2/12 who plans to re-assess him in a few days regarding transitioning to Suboxone.  Septic arthritis of the sternoclavicular joint, abscess  -June 15, 2017 :status post irrigation of the right sternoclavicular wound, with a wound VAC placement which has been removed -CT surgery follow-up 2/20 appreciated.  Improving.  Multifocal pneumonia -Possible septic emboli to the lungs- resolved.   IV drug user -Per patient, has been using heroin- [facility] but relapsed again in a month before the admission -Last heroin use a week ago prior to admission- no withdrawal noted in the hospital -polysubstance abuse counseling  Nicotine abuse -Nicotine patch -Tobacco cessation counseling-apparently had been smoking repeatedly in the bathroom.  Hypokalemia Replace and follow periodically.  Magnesium 1.9.   DVT prophylaxis: sq Lovenox Code Status: Full code Disposition Plan: will stay at [facility] until IV antibiotic course complete if patient agrees. Consultants:   CT surgery, ID, ortho, IR Procedures:     Irrigation of chest wall with wound vac change 06/22/17.    Left hip I&D 06/06/17  Left hip I&D on 06/29/2017.  Antimicrobials:  Anti-infectives (From admission, onward)   Start     Dose/Rate Route Frequency Ordered Stop   06/29/17 1517  gentamicin (GARAMYCIN) injection  Status:  Discontinued       As needed 06/29/17 1518 06/29/17 1714   06/29/17 1512  vancomycin (VANCOCIN) powder  Status:  Discontinued       As needed 06/29/17 1515 06/29/17 1714  06/29/17 1450  vancomycin (VANCOCIN) 1,000 mg in sodium chloride 0.9 % 1,000 mL irrigation  Status:  Discontinued       As needed 06/29/17 1451 06/29/17 1714   06/29/17 1345  vancomycin (VANCOCIN) 1,000 mg in sodium chloride 0.9 % 1,000 mL irrigation      Irrigation To Surgery 06/29/17 1330  06/30/17 1345   06/15/17 0816  vancomycin (VANCOCIN) powder  Status:  Discontinued       As needed 06/15/17 0817 06/15/17 0854   06/07/17 1400  cefUROXime (ZINACEF) 1.5 g in dextrose 5 % 50 mL IVPB     1.5 g 100 mL/hr over 30 Minutes Intravenous To ShortStay Surgical 06/07/17 0125 06/07/17 1527   06/07/17 0745  vancomycin (VANCOCIN) 1,000 mg in sodium chloride 0.9 % 1,000 mL irrigation      Irrigation To Surgery 06/07/17 0744 06/07/17 1513   06/06/17 1639  gentamicin (GARAMYCIN) injection  Status:  Discontinued       As needed 06/06/17 1640 06/06/17 1715   06/06/17 1638  vancomycin (VANCOCIN) powder  Status:  Discontinued       As needed 06/06/17 1639 06/06/17 1715   06/06/17 1000  ceFAZolin (ANCEF) IVPB 2g/100 mL premix     2 g 200 mL/hr over 30 Minutes Intravenous Every 8 hours 06/06/17 0911     06/06/17 0000  vancomycin (VANCOCIN) IVPB 1000 mg/200 mL premix  Status:  Discontinued     1,000 mg 200 mL/hr over 60 Minutes Intravenous Every 8 hours 06/05/17 2211 06/06/17 0911   06/05/17 2330  piperacillin-tazobactam (ZOSYN) IVPB 3.375 g  Status:  Discontinued     3.375 g 12.5 mL/hr over 240 Minutes Intravenous Every 8 hours 06/05/17 2211 06/06/17 0911   06/05/17 2200  cefTRIAXone (ROCEPHIN) 1 g in dextrose 5 % 50 mL IVPB  Status:  Discontinued     1 g 100 mL/hr over 30 Minutes Intravenous Every 24 hours 06/05/17 2140 06/05/17 2202   06/05/17 2200  azithromycin (ZITHROMAX) 500 mg in dextrose 5 % 250 mL IVPB  Status:  Discontinued     500 mg 250 mL/hr over 60 Minutes Intravenous Every 24 hours 06/05/17 2140 06/06/17 0911   06/05/17 1600  vancomycin (VANCOCIN) 1,500 mg in sodium chloride 0.9 % 500 mL IVPB     1,500 mg 250 mL/hr over 120 Minutes Intravenous  Once 06/05/17 1542 06/05/17 1855   06/05/17 1515  piperacillin-tazobactam (ZOSYN) IVPB 3.375 g     3.375 g 100 mL/hr over 30 Minutes Intravenous  Once 06/05/17 1512 06/05/17 1557       Objective: Vitals:   07/12/17 0549 07/12/17  1537 07/12/17 2104 07/13/17 0503  BP: (!) 100/57 98/61 (!) 105/55 (!) 104/58  Pulse: 94 87 79 86  Resp: 18 18 19 16   Temp: 98.3 F (36.8 C) 98.2 F (36.8 C) 97.8 F (36.6 C) 98.4 F (36.9 C)  TempSrc: Oral Oral Oral Oral  SpO2: 93% 91% 98% 98%  Weight:      Height:        Intake/Output Summary (Last 24 hours) at 07/13/2017 1325 Last data filed at 07/13/2017 0103 Gross per 24 hour  Intake 541.5 ml  Output -  Net 541.5 ml   Filed Weights   06/05/17 1407 06/06/17 0844 06/29/17 0557  Weight: 68 kg (150 lb) 70.3 kg (154 lb 15.7 oz) 70.4 kg (155 lb 3.3 oz)    Examination: General exam: Pleasant young male, moderately built and nourished, lying comfortably supine in bed. Respiratory system:  Clear to auscultation. Respiratory effort normal.  Stable without change. Cardiovascular system: S1 & S2 heard, RRR.  No murmurs. Stable without change. Gastrointestinal system: Abdomen soft, non-tender, nondistended. Normal bowel sound. No organomegaly Central nervous system: Alert and oriented. No focal neurological deficits. Left hip: This is dressed.   Data Reviewed: I have personally reviewed following labs and imaging studies  CBC: Recent Labs  Lab 07/13/17 0430  WBC 7.9  HGB 10.7*  HCT 32.9*  MCV 90.9  PLT 242   Basic Metabolic Panel: Recent Labs  Lab 07/10/17 0507 07/13/17 0430  NA  --  133*  K  --  3.3*  CL  --  97*  CO2  --  26  GLUCOSE  --  119*  BUN  --  5*  CREATININE 0.46* 0.55*  CALCIUM  --  8.8*  MG  --  1.9   GFR: Estimated Creatinine Clearance: 128.3 mL/min (A) (by C-G formula based on SCr of 0.55 mg/dL (L)).       Radiology Studies: No results found.    Scheduled Meds: . enoxaparin (LOVENOX) injection  40 mg Subcutaneous Q24H  . gabapentin  300 mg Oral TID  . zinc sulfate  220 mg Oral Daily   Continuous Infusions: .  ceFAZolin (ANCEF) IV Stopped (07/13/17 1111)     LOS: 38 days    Time spent in minutes: 25 minutes.  Marcellus ScottAnand Hena Ewalt,  MD, FACP, [facility]. [facility] Pager [phone]  If 7PM-7AM, please contact night-coverage [email or web address] Password [email or web address] 07/13/2017, 1:25 PM

## 2017-07-14 ENCOUNTER — Inpatient Hospital Stay (HOSPITAL_COMMUNITY): Payer: Self-pay

## 2017-07-14 LAB — URINALYSIS, ROUTINE W REFLEX MICROSCOPIC
BILIRUBIN URINE: NEGATIVE
Glucose, UA: NEGATIVE mg/dL
HGB URINE DIPSTICK: NEGATIVE
Ketones, ur: NEGATIVE mg/dL
Leukocytes, UA: NEGATIVE
Nitrite: NEGATIVE
PH: 6 (ref 5.0–8.0)
Protein, ur: NEGATIVE mg/dL
SPECIFIC GRAVITY, URINE: 1.014 (ref 1.005–1.030)

## 2017-07-14 NOTE — Progress Notes (Signed)
PROGRESS NOTE    Clifford BoschChristopher Tucker   IEP:[id number]  DOB: [birth year]  DOA: 06/05/2017 PCP: Patient, No Pcp Per   Brief Narrative:  Clifford BoschChristopher Tucker is a 37 y.o.malewith medical history significant forIV drug abuse and recurrent skin and soft tissue infections. He presented with neck/hip pain found to have a neck abscess, septic arthritis of the hip and now MSSA bacteremia. CT surgery, orthopedic surgery and infectious disease consulted.  Plan is for patient to complete his IV antibiotics on 08/03/2017.  Patient remains stable without acute issues. Spiked fever of 101 on 2/22 without clear new source.   Subjective: Had fever off 101.2 this morning.  Mild intermittent headache.  Denies any other complaints.  No earache, sore throat, cough, dyspnea, chest pain, nausea, vomiting, diarrhea, urinary frequency, dysuria or change in status of his sternal wound or left hip surgical site.  Assessment & Plan:  Bacteremia due to methicillin susceptible Staphylococcus aureus (MSSA) in the setting of IV drug use -Blood cultures 2/2+ for MSSA   -Patient was originally placed on vancomycin and Zosyn, transitioned to IV Ancef -Sternal aspirate showed MSSA, left hip wound cultures also MSSA -2D echo with no vegetation.  - PICC placed- cannot go home with PICC due to IVDA - per social work, he cannot go to SNF either as he is suspected to be tampering with his IV and also has been smoking in the bathroom - Appreciate infectious disease input. -Completed IV antibiotics on 08/03/2017 >as per nursing, some confusion regarding the exact end date.  Will request pharmacy to advise.. -Patient spiked fever 2/22.  Unclear source.  Chest x-ray negative.  Patient refuses to have repeat blood cultures done unless he has recurrent fevers.  Alerted ID.    Left hip abscess/iliopsoas septic arthritis, infectious myositis s/p I&D 06/06/17 -Status post left hip I&D with cultures done on 1/15, showing staph  aureus -Status post drain placement 1/18 for iliacus abscess which was removed on 06/14/17 -Doppler ultrasound of the left lower extremity negative for DVT - he stated he had been having persistent pain and tenderness in the left hip.  Orthopedics reevaluated. - MRI showed fluid collection around the hip - 2/7- The patient was taken to the OR for another I and D and wound VAC removed. -Cultures taken on 06/29/2017 negative, final report. -Left hip surgical site wound has healed well without acute findings.  Pain in left hip/opioid use disorder- - judicious use of opioids.  Some conflict early on related to patient wanting increased dose of opioids.   - Last evaluated by Dr. Oswaldo DoneVincent on 2/12 who plans to re-assess him in a few days regarding transitioning to Suboxone.  Septic arthritis of the sternoclavicular joint, abscess  -June 15, 2017 :status post irrigation of the right sternoclavicular wound, with a wound VAC placement which has been removed -CT surgery follow-up 2/20 appreciated.  Improving.  No acute findings on examination of sternoclavicular joint.  Multifocal pneumonia -Possible septic emboli to the lungs- resolved.   IV drug user -Per patient, has been using heroin- [facility] but relapsed again in a month before the admission -Last heroin use a week ago prior to admission- no withdrawal noted in the hospital -polysubstance abuse counseling  Nicotine abuse -Nicotine patch -Tobacco cessation counseling-apparently had been smoking repeatedly in the bathroom.  Hypokalemia Replace and follow periodically.  Magnesium 1.9.   DVT prophylaxis: sq Lovenox Code Status: Full code [facility]  [facility] if patient agrees. Consultants:   CT surgery, ID, ortho, IR Procedures:     Irrigation of chest wall with wound vac change 06/22/17.    Left hip I&D 06/06/17  Left hip I&D on  06/29/2017.  Antimicrobials:  Anti-infectives (From admission, onward)   Start     Dose/Rate Route Frequency Ordered Stop   06/29/17 1517  gentamicin (GARAMYCIN) injection  Status:  Discontinued       As needed 06/29/17 1518 06/29/17 1714   06/29/17 1512  vancomycin (VANCOCIN) powder  Status:  Discontinued       As needed 06/29/17 1515 06/29/17 1714   06/29/17 1450  vancomycin (VANCOCIN) 1,000 mg in sodium chloride 0.9 % 1,000 mL irrigation  Status:  Discontinued       As needed 06/29/17 1451 06/29/17 1714   06/29/17 1345  vancomycin (VANCOCIN) 1,000 mg in sodium chloride 0.9 % 1,000 mL irrigation      Irrigation To Surgery 06/29/17 1330 06/30/17 1345   06/15/17 0816  vancomycin (VANCOCIN) powder  Status:  Discontinued       As needed 06/15/17 0817 06/15/17 0854   06/07/17 1400  cefUROXime (ZINACEF) 1.5 g in dextrose 5 % 50 mL IVPB     1.5 g 100 mL/hr over 30 Minutes Intravenous To ShortStay Surgical 06/07/17 0125 06/07/17 1527   06/07/17 0745  vancomycin (VANCOCIN) 1,000 mg in sodium chloride 0.9 % 1,000 mL irrigation      Irrigation To Surgery 06/07/17 0744 06/07/17 1513   06/06/17 1639  gentamicin (GARAMYCIN) injection  Status:  Discontinued       As needed 06/06/17 1640 06/06/17 1715   06/06/17 1638  vancomycin (VANCOCIN) powder  Status:  Discontinued       As needed 06/06/17 1639 06/06/17 1715   06/06/17 1000  ceFAZolin (ANCEF) IVPB 2g/100 mL premix     2 g 200 mL/hr over 30 Minutes Intravenous Every 8 hours 06/06/17 0911     06/06/17 0000  vancomycin (VANCOCIN) IVPB 1000 mg/200 mL premix  Status:  Discontinued     1,000 mg 200 mL/hr over 60 Minutes Intravenous Every 8 hours 06/05/17 2211 06/06/17 0911   06/05/17 2330  piperacillin-tazobactam (ZOSYN) IVPB 3.375 g  Status:  Discontinued     3.375 g 12.5 mL/hr over 240 Minutes Intravenous Every 8 hours 06/05/17 2211 06/06/17 0911   06/05/17 2200  cefTRIAXone (ROCEPHIN) 1 g in dextrose 5 % 50 mL IVPB  Status:  Discontinued     1  g 100 mL/hr over 30 Minutes Intravenous Every 24 hours 06/05/17 2140 06/05/17 2202   06/05/17 2200  azithromycin (ZITHROMAX) 500 mg in dextrose 5 % 250 mL IVPB  Status:  Discontinued     500 mg 250 mL/hr over 60 Minutes Intravenous Every 24 hours 06/05/17 2140 06/06/17 0911   06/05/17 1600  vancomycin (VANCOCIN) 1,500 mg in sodium chloride 0.9 % 500 mL IVPB     1,500 mg 250 mL/hr over 120 Minutes Intravenous  Once 06/05/17 1542 06/05/17 1855   06/05/17 1515  piperacillin-tazobactam (ZOSYN) IVPB 3.375 g     3.375 g 100 mL/hr over 30 Minutes Intravenous  Once 06/05/17 1512 06/05/17 1557       Objective: Vitals:   07/13/17 2309 07/14/17 0549 07/14/17 1311 07/14/17 1324  BP: 98/63 (!) 107/45 (!) 83/47 99/61  Pulse: 97 (!) 101 88 90  Resp: 17 16 20    Temp: 100.1 F (37.8 C) (!) 101.2 F (38.4 C) 100 F (37.8 C)  TempSrc: Oral  Oral   SpO2: 97% 96% 97%   Weight:      Height:       No intake or output data in the 24 hours ending 07/14/17 1404 Filed Weights   06/05/17 1407 06/06/17 0844 06/29/17 0557  Weight: 68 kg (150 lb) 70.3 kg (154 lb 15.7 oz) 70.4 kg (155 lb 3.3 oz)    Examination: General exam: Pleasant young male, moderately built and nourished, lying comfortably supine in bed.  Does not look septic or toxic. Respiratory system: Clear to auscultation.  No increased work of breathing. Cardiovascular system: S1 and S2 heard, RRR.  No JVD, murmurs or pedal edema. Gastrointestinal system: Abdomen soft, non-tender, nondistended. Normal bowel sound. No organomegaly stable without change. Central nervous system: Alert and oriented. No focal neurological deficits. Left hip: Left hip surgical site has healed without acute findings. Skin: Sternoclavicular area wound without acute findings and is packed.   Data Reviewed: I have personally reviewed following labs and imaging studies  CBC: Recent Labs  Lab 07/13/17 0430  WBC 7.9  HGB 10.7*  HCT 32.9*  MCV 90.9  PLT 242    Basic Metabolic Panel: Recent Labs  Lab 07/10/17 0507 07/13/17 0430  NA  --  133*  K  --  3.3*  CL  --  97*  CO2  --  26  GLUCOSE  --  119*  BUN  --  5*  CREATININE 0.46* 0.55*  CALCIUM  --  8.8*  MG  --  1.9   GFR: Estimated Creatinine Clearance: 128.3 mL/min (A) (by C-G formula based on SCr of 0.55 mg/dL (L)).       Radiology Studies: Dg Chest 2 View  Result Date: 07/14/2017 CLINICAL DATA:  Fever EXAM: CHEST  2 VIEW COMPARISON:  06/07/2017 FINDINGS: Right PICC line is in place with the tip in the SVC. Heart and mediastinal contours are within normal limits. No focal opacities or effusions. No acute bony abnormality. IMPRESSION: No active cardiopulmonary disease. Electronically Signed   By: Charlett Nose M.D.   On: 07/14/2017 09:04      Scheduled Meds: . enoxaparin (LOVENOX) injection  40 mg Subcutaneous Q24H  . gabapentin  300 mg Oral TID  . zinc sulfate  220 mg Oral Daily   Continuous Infusions: .  ceFAZolin (ANCEF) IV Stopped (07/14/17 1033)     LOS: 39 days    Time spent in minutes: 25 minutes.  Marcellus Scott, MD, FACP, [facility]. [facility] Pager [phone]  If 7PM-7AM, please contact night-coverage [email or web address] Password [facility] 07/14/2017, 2:04 PM

## 2017-07-14 NOTE — Progress Notes (Signed)
Pt refused to get blood drawn for cultures.  MD was notified.

## 2017-07-15 ENCOUNTER — Inpatient Hospital Stay (HOSPITAL_COMMUNITY): Payer: Self-pay

## 2017-07-15 DIAGNOSIS — R509 Fever, unspecified: Secondary | ICD-10-CM

## 2017-07-15 DIAGNOSIS — R000 Tachycardia, unspecified: Secondary | ICD-10-CM

## 2017-07-15 LAB — INFLUENZA PANEL BY PCR (TYPE A & B)
INFLAPCR: NEGATIVE
Influenza B By PCR: NEGATIVE

## 2017-07-15 LAB — URINE CULTURE: CULTURE: NO GROWTH

## 2017-07-15 MED ORDER — SODIUM CHLORIDE 0.9 % IV SOLN
2.0000 g | INTRAVENOUS | Status: DC
  Administered 2017-07-15 – 2017-08-02 (×106): 2 g via INTRAVENOUS
  Filled 2017-07-15 (×110): qty 2000

## 2017-07-15 MED ORDER — SODIUM CHLORIDE 0.9 % IV SOLN
INTRAVENOUS | Status: DC
  Administered 2017-07-15 (×2): via INTRAVENOUS

## 2017-07-15 NOTE — Progress Notes (Signed)
For chest CT . Transported to Radiology via wheelchair @ around 1700hr

## 2017-07-15 NOTE — Progress Notes (Signed)
Patient refused dressing change this morning. °

## 2017-07-15 NOTE — Progress Notes (Signed)
PROGRESS NOTE    Clifford Tucker   ZOX:[id number]  DOB: 13-Feb-1981  DOA: 06/05/2017 PCP: Patient, No Pcp Per   Brief Narrative:  Clifford Tucker is a 37 y.o.malewith medical history significant forIV drug abuse and recurrent skin and soft tissue infections. He presented with neck/hip pain found to have a neck abscess, septic arthritis of the hip and now MSSA bacteremia. CT surgery, orthopedic surgery and infectious disease consulted.  Plan is for patient to complete his IV antibiotics on 08/03/2017.  Patient remains stable without acute issues. Spiked intermittent high fever since 2/22 without clear new source.  Chest x-ray and urine microscopy negative.  Blood cultures x2 from 2/23: Pending.   Subjective: Having intermittent high fever since 2/22.  No symptoms to suggest source.  Allowed blood culture draw this morning.  Assessment & Plan:  Bacteremia due to methicillin susceptible Staphylococcus aureus (MSSA) in the setting of IV drug use -Blood cultures 2/2+ for MSSA   -Patient was originally placed on vancomycin and Zosyn, transitioned to IV Ancef -Sternal aspirate showed MSSA, left hip wound cultures also MSSA -2D echo with no vegetation.  - PICC placed- cannot go home with PICC due to IVDA - per social work, he cannot go to SNF either as he is suspected to be tampering with his IV and also has been smoking in the bathroom - Appreciate infectious disease input. -Completed IV antibiotics on 08/03/2017 >as per nursing, some confusion regarding the exact end date.  Will request pharmacy to advise.. -Patient spiking intermittent fevers since 2/22.  Unclear source.  Chest x-ray & urine microscopy negative.  Blood cultures drawn 2/23.  I discussed with Dr. Drue Second ID who recommends checking flu panel (rule out nosocomial flu), check CT chest without contrast to reassess sternoclavicular region, she will make antibiotic changes.  Will place on IV fluids.   Left hip  abscess/iliopsoas septic arthritis, infectious myositis s/p I&D 06/06/17 -Status post left hip I&D with cultures done on 1/15, showing staph aureus -Status post drain placement 1/18 for iliacus abscess which was removed on 06/14/17 -Doppler ultrasound of the left lower extremity negative for DVT - he stated he had been having persistent pain and tenderness in the left hip.  Orthopedics reevaluated. - MRI showed fluid collection around the hip - 2/7- The patient was taken to the OR for another I and D and wound VAC removed. -Cultures taken on 06/29/2017 negative, final report. -Left hip surgical site wound has healed well without acute findings.  Pain in left hip/opioid use disorder- - judicious use of opioids.  Some conflict early on related to patient wanting increased dose of opioids.   - Last evaluated by Dr. Oswaldo Done on 2/12 who plans to re-assess him in a few days regarding transitioning to Suboxone.  Septic arthritis of the sternoclavicular joint, abscess  -June 15, 2017 :status post irrigation of the right sternoclavicular wound, with a wound VAC placement which has been removed -CT surgery follow-up 2/20 appreciated.  Improving.  No acute findings on examination of sternoclavicular joint.[name] with CT chest without contrast due to recurrent fevers.  Multifocal pneumonia -Possible septic emboli to the lungs- resolved.   IV drug user -Per patient, has been using heroin- [facility] but relapsed again in a month before the admission -Last heroin use a week ago prior to admission- no withdrawal noted in the hospital -polysubstance abuse counseling  Nicotine abuse -Nicotine patch -Tobacco cessation counseling-apparently had been smoking repeatedly in the bathroom.  Hypokalemia Replace and follow periodically.  Magnesium 1.9.   DVT prophylaxis: sq Lovenox Code Status: Full code Disposition Plan: will stay at [facility] until IV antibiotic  course complete if patient agrees. Consultants:   CT surgery, ID, ortho, IR Procedures:     Irrigation of chest wall with wound vac change 06/22/17.    Left hip I&D 06/06/17  Left hip I&D on 06/29/2017.  Antimicrobials:  Anti-infectives (From admission, onward)   Start     Dose/Rate Route Frequency Ordered Stop   06/29/17 1517  gentamicin (GARAMYCIN) injection  Status:  Discontinued       As needed 06/29/17 1518 06/29/17 1714   06/29/17 1512  vancomycin (VANCOCIN) powder  Status:  Discontinued       As needed 06/29/17 1515 06/29/17 1714   06/29/17 1450  vancomycin (VANCOCIN) 1,000 mg in sodium chloride 0.9 % 1,000 mL irrigation  Status:  Discontinued       As needed 06/29/17 1451 06/29/17 1714   06/29/17 1345  vancomycin (VANCOCIN) 1,000 mg in sodium chloride 0.9 % 1,000 mL irrigation      Irrigation To Surgery 06/29/17 1330 06/30/17 1345   06/15/17 0816  vancomycin (VANCOCIN) powder  Status:  Discontinued       As needed 06/15/17 0817 06/15/17 0854   06/07/17 1400  cefUROXime (ZINACEF) 1.5 g in dextrose 5 % 50 mL IVPB     1.5 g 100 mL/hr over 30 Minutes Intravenous To [facility] 06/07/17 0125 06/07/17 1527   06/07/17 0745  vancomycin (VANCOCIN) 1,000 mg in sodium chloride 0.9 % 1,000 mL irrigation      Irrigation To Surgery 06/07/17 0744 06/07/17 1513   06/06/17 1639  gentamicin (GARAMYCIN) injection  Status:  Discontinued       As needed 06/06/17 1640 06/06/17 1715   06/06/17 1638  vancomycin (VANCOCIN) powder  Status:  Discontinued       As needed 06/06/17 1639 06/06/17 1715   06/06/17 1000  ceFAZolin (ANCEF) IVPB 2g/100 mL premix     2 g 200 mL/hr over 30 Minutes Intravenous Every 8 hours 06/06/17 0911     06/06/17 0000  vancomycin (VANCOCIN) IVPB 1000 mg/200 mL premix  Status:  Discontinued     1,000 mg 200 mL/hr over 60 Minutes Intravenous Every 8 hours 06/05/17 2211 06/06/17 0911   06/05/17 2330  piperacillin-tazobactam (ZOSYN) IVPB 3.375 g  Status:  Discontinued      3.375 g 12.5 mL/hr over 240 Minutes Intravenous Every 8 hours 06/05/17 2211 06/06/17 0911   06/05/17 2200  cefTRIAXone (ROCEPHIN) 1 g in dextrose 5 % 50 mL IVPB  Status:  Discontinued     1 g 100 mL/hr over 30 Minutes Intravenous Every 24 hours 06/05/17 2140 06/05/17 2202   06/05/17 2200  azithromycin (ZITHROMAX) 500 mg in dextrose 5 % 250 mL IVPB  Status:  Discontinued     500 mg 250 mL/hr over 60 Minutes Intravenous Every 24 hours 06/05/17 2140 06/06/17 0911   06/05/17 1600  vancomycin (VANCOCIN) 1,500 mg in sodium chloride 0.9 % 500 mL IVPB     1,500 mg 250 mL/hr over 120 Minutes Intravenous  Once 06/05/17 1542 06/05/17 1855   06/05/17 1515  piperacillin-tazobactam (ZOSYN) IVPB 3.375 g     3.375 g 100 mL/hr over 30 Minutes Intravenous  Once 06/05/17 1512 06/05/17 1557       Objective: Vitals:   07/14/17 2016 07/15/17 0645 07/15/17 0800 07/15/17 1431  BP: (!) 99/46 (!) 101/46 (!) 100/54 Marland Kitchen)  101/46  Pulse: 94 80 93 (!) 104  Resp: 18 16  20   Temp: (!) 100.5 F (38.1 C) (!) 101.8 F (38.8 C) 98.7 F (37.1 C) (!) 102.6 F (39.2 C)  TempSrc: Oral Oral Oral Oral  SpO2: 97% 99% 98% 97%  Weight:      Height:        Intake/Output Summary (Last 24 hours) at 07/15/2017 1442 Last data filed at 07/14/2017 1700 Gross per 24 hour  Intake 222 ml  Output -  Net 222 ml   Filed Weights   06/05/17 1407 06/06/17 0844 06/29/17 0557  Weight: 68 kg (150 lb) 70.3 kg (154 lb 15.7 oz) 70.4 kg (155 lb 3.3 oz)    Examination: General exam: Pleasant young male, moderately built and nourished, lying comfortably supine in bed.  Does not look septic or toxic.  Does not appear in any distress. Respiratory system: Clear to auscultation.  No increased work of breathing. Cardiovascular system: S1 and S2 heard, RRR.  No JVD, murmurs or pedal edema.  Stable without change. Gastrointestinal system: Abdomen soft, non-tender, nondistended. Normal bowel sound. No organomegaly stable without  change. Central nervous system: Alert and oriented. No focal neurological deficits. Left hip: Left hip surgical site has healed without acute findings.   Skin: Sternoclavicular area wound without acute findings and is packed.     Data Reviewed: I have personally reviewed following labs and imaging studies  CBC: Recent Labs  Lab 07/13/17 0430  WBC 7.9  HGB 10.7*  HCT 32.9*  MCV 90.9  PLT 242   Basic Metabolic Panel: Recent Labs  Lab 07/10/17 0507 07/13/17 0430  NA  --  133*  K  --  3.3*  CL  --  97*  CO2  --  26  GLUCOSE  --  119*  BUN  --  5*  CREATININE 0.46* 0.55*  CALCIUM  --  8.8*  MG  --  1.9   GFR: Estimated Creatinine Clearance: 128.3 mL/min (A) (by C-G formula based on SCr of 0.55 mg/dL (L)).       Radiology Studies: Dg Chest 2 View  Result Date: 07/14/2017 CLINICAL DATA:  Fever EXAM: CHEST  2 VIEW COMPARISON:  06/07/2017 FINDINGS: Right PICC line is in place with the tip in the SVC. Heart and mediastinal contours are within normal limits. No focal opacities or effusions. No acute bony abnormality. IMPRESSION: No active cardiopulmonary disease. Electronically Signed   By: Charlett NoseKevin  Dover M.D.   On: 07/14/2017 09:04      Scheduled Meds: . enoxaparin (LOVENOX) injection  40 mg Subcutaneous Q24H  . gabapentin  300 mg Oral TID  . zinc sulfate  220 mg Oral Daily   Continuous Infusions: .  ceFAZolin (ANCEF) IV Stopped (07/15/17 1300)     LOS: 40 days    Time spent in minutes: 25 minutes.  Marcellus ScottAnand Evynn Boutelle, MD, FACP, [facility] HospitalFHM. [facility] Pager [phone]  If 7PM-7AM, please contact night-coverage [email or web address] Password [email or web address] 07/15/2017, 2:42 PM

## 2017-07-15 NOTE — Progress Notes (Addendum)
    [facility]    Date of Admission:  06/05/2017   Total days of antibiotics - cefazolin   ID: Clifford BoschChristopher Tucker is a 37 y.o. 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 " I read on the internet that picc lines cause fever and I can die of cardiac arrest" Medications:  . enoxaparin (LOVENOX) injection  40 mg Subcutaneous Q24H  . gabapentin  300 mg Oral TID  . zinc sulfate  220 mg Oral Daily    Objective: Vital signs in last 24 hours: Temp:  [98.7 F (37.1 C)-102.6 F (39.2 C)] 102.6 F (39.2 C) (02/23 1431) Pulse Rate:  [80-104] 104 (02/23 1431) Resp:  [16-20] 20 (02/23 1431) BP: (99-101)/(46-54) 101/46 (02/23 1431) SpO2:  [97 %-99 %] 97 % (02/23 1431) Physical Exam  Constitutional: He is oriented to person, place, and time. He appears well-developed and well-nourished. No distress.  HENT:  Mouth/Throat: Oropharynx is clear and moist. No oropharyngeal exudate.  Chest wall = bandaged Cardiovascular: tachycardic, regular rhythm and normal heart sounds. Exam reveals no gallop and no friction rub.  No murmur heard.  Pulmonary/Chest: Effort normal and breath sounds normal. No respiratory distress. He has no wheezes.  Abdominal: Soft. Bowel sounds are normal. He exhibits no distension. There is no tenderness.  Lymphadenopathy:  He has no cervical adenopathy.  Ext: right arm picc line is c/d/i, no swelilng or pain to upper arm Neurological: He is  alert and oriented to person, place, and time.  Skin: Skin is warm and dry. No rash noted. No erythema.  Psychiatric: appears anxious    Lab Results Recent Labs    07/13/17 0430  WBC 7.9  HGB 10.7*  HCT 32.9*  NA 133*  K 3.3*  CL 97*  CO2 26  BUN 5*  CREATININE 0.55*    Microbiology: 2/23 blood cx Studies/Results: Dg Chest 2 View  Result Date: 07/14/2017 CLINICAL DATA:  Fever EXAM: CHEST  2 VIEW COMPARISON:  06/07/2017 FINDINGS: Right PICC line is in place with the tip in the SVC. Heart and mediastinal contours are within normal limits. No focal opacities or effusions. No acute bony abnormality. IMPRESSION: No active cardiopulmonary disease. Electronically Signed   By: Charlett NoseKevin  Dover M.D.   On: 07/14/2017 09:04     Assessment/Plan: Fevers = concern that new nidus of infection is present. Will check chest CT to see if any pulmonary lesions or worsening Secretary joint area. Repeat blood cx pending. If blood cx positive, will need to pull picc line and consider getting repeat echo  mssa disseminated infection = will change abtx to nafcillin 2gm IV q 4hr for now  [facility]or[facility]ynthia Avro[facility] Cell: [phone] Pager: [phone]  07/15/2017, 3:26 PM

## 2017-07-15 NOTE — Progress Notes (Signed)
Patient refused his dressing change this evening. He agreed to let me change his dressing between 5 and 6am.

## 2017-07-15 NOTE — Progress Notes (Signed)
Patient refused dressing changed today

## 2017-07-16 LAB — COMPREHENSIVE METABOLIC PANEL
ALK PHOS: 100 U/L (ref 38–126)
ALT: 29 U/L (ref 17–63)
ANION GAP: 7 (ref 5–15)
AST: 67 U/L — ABNORMAL HIGH (ref 15–41)
Albumin: 2.8 g/dL — ABNORMAL LOW (ref 3.5–5.0)
BILIRUBIN TOTAL: 0.4 mg/dL (ref 0.3–1.2)
BUN: 5 mg/dL — AB (ref 6–20)
CALCIUM: 8 mg/dL — AB (ref 8.9–10.3)
CO2: 25 mmol/L (ref 22–32)
Chloride: 99 mmol/L — ABNORMAL LOW (ref 101–111)
Creatinine, Ser: 0.56 mg/dL — ABNORMAL LOW (ref 0.61–1.24)
GFR calc Af Amer: >60 mL/min (ref >60)
Glucose, Bld: 141 mg/dL — ABNORMAL HIGH (ref 65–99)
POTASSIUM: 3.3 mmol/L — AB (ref 3.5–5.1)
Sodium: 131 mmol/L — ABNORMAL LOW (ref 135–145)
TOTAL PROTEIN: 6 g/dL — AB (ref 6.5–8.1)

## 2017-07-16 LAB — CBC WITH DIFFERENTIAL/PLATELET
Basophils Absolute: 0 10*3/uL (ref 0.0–0.1)
Basophils Relative: 0 %
Eosinophils Absolute: 0 10*3/uL (ref 0.0–0.7)
Eosinophils Relative: 1 %
HEMATOCRIT: 31 % — AB (ref 39.0–52.0)
HEMOGLOBIN: 10.2 g/dL — AB (ref 13.0–17.0)
LYMPHS ABS: 0.9 10*3/uL (ref 0.7–4.0)
LYMPHS PCT: 27 %
MCH: 28.7 pg (ref 26.0–34.0)
MCHC: 32.9 g/dL (ref 30.0–36.0)
MCV: 87.1 fL (ref 78.0–100.0)
MONO ABS: 0.2 10*3/uL (ref 0.1–1.0)
MONOS PCT: 5 %
NEUTROS ABS: 2.3 10*3/uL (ref 1.7–7.7)
NEUTROS PCT: 67 %
Platelets: 157 10*3/uL (ref 150–400)
RBC: 3.56 MIL/uL — ABNORMAL LOW (ref 4.22–5.81)
RDW: 13.6 % (ref 11.5–15.5)
WBC: 3.5 10*3/uL — ABNORMAL LOW (ref 4.0–10.5)

## 2017-07-16 MED ORDER — POTASSIUM CHLORIDE CRYS ER 20 MEQ PO TBCR
30.0000 meq | EXTENDED_RELEASE_TABLET | ORAL | Status: AC
  Administered 2017-07-16 (×2): 30 meq via ORAL
  Filled 2017-07-16 (×2): qty 1

## 2017-07-16 MED ORDER — SODIUM CHLORIDE 0.9 % IV SOLN
INTRAVENOUS | Status: DC
  Administered 2017-07-16 – 2017-07-17 (×2): via INTRAVENOUS

## 2017-07-16 NOTE — Progress Notes (Signed)
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 Still having fevers, up to 102F, persistent yesterday. No new sources found on exam nothing new, picc line is c/d/i Chest Ct showing improvement in  region as well as in parenchyma Blood cx NGTD at 24hr ABtx changed to nafcillin from cefazolin on 2/24 Labs drawn today show some hyponatremia, leukopenia at wbc of 3.4 previously at 7.  Fever of unknown source, await to see if repeat bacteremia - if still febrile today, consider pulling picc line - if blood culture positive, will still need to pull picc line and get TEE for evaluation of endocarditis. No new murmurs on exam yesterday   Leukopenia - - could be due to abtx. Which we have discontinued - repeat cbc on 2/26\   Hyponatremia - thought to be due to hypovolemia, recommend ivf or better oral intake

## 2017-07-16 NOTE — Progress Notes (Signed)
PROGRESS NOTE    Clifford Tucker   ZOX:[id number]  DOB: 1980-10-21  DOA: 06/05/2017 PCP: Patient, No Pcp Per   Brief Narrative:  Clifford Tucker is a 37 y.o.malewith medical history significant forIV drug abuse and recurrent skin and soft tissue infections. He presented with neck/hip pain found to have a neck abscess, septic arthritis of the hip and now MSSA bacteremia. CT surgery, orthopedic surgery and infectious disease consulted.  Plan is for patient to complete his IV antibiotics on 08/03/2017.  Patient remains stable without acute issues. Spiked intermittent high fever since 2/22 without clear new source.  Chest x-ray and urine microscopy negative.  Blood cultures x2 from 2/23: Pending.   Subjective: Having intermittent high fever since 2/22 including fever of 102.6 this morning.  Remains without symptoms to suggest source.    Assessment & Plan:  Bacteremia due to methicillin susceptible Staphylococcus aureus (MSSA) in the setting of IV drug use -Blood cultures 2/2+ for MSSA   -Patient was originally placed on vancomycin and Zosyn, transitioned to IV Ancef -Sternal aspirate showed MSSA, left hip wound cultures also MSSA -2D echo with no vegetation.  - PICC placed- cannot go home with PICC due to IVDA - per social work, he cannot go to SNF either as he is suspected to be tampering with his IV and also has been smoking in the bathroom - Appreciate infectious disease input. -Complete IV antibiotics on 08/03/2017 >as per nursing, some confusion regarding the exact end date.  Will request pharmacy to advise.. -Patient spiking intermittent fevers since 2/22.  Unclear source.  Chest x-ray & urine microscopy negative.  Blood cultures drawn 2/23-results pending.  Influenza panel PCR negative.  CT chest without contrast unremarkable.  Ancef changed to nafcillin 2/24. 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  If persisting fever or positive blood cultures, need to pull out PICC line and consider TEE to rule out endocarditis.  IV normal saline to address insensible loss from fevers.   Left hip abscess/iliopsoas septic arthritis, infectious myositis s/p I&D 06/06/17 -Status post left hip I&D with cultures done on 1/15, showing staph aureus -Status post drain placement 1/18 for iliacus abscess which was removed on 06/14/17 -Doppler ultrasound of the left lower extremity negative for DVT - he stated he had been having persistent pain and tenderness in the left hip.  Orthopedics reevaluated. - MRI showed fluid collection around the hip - 2/7- The patient was taken to the OR for another I and D and wound VAC removed. -Cultures taken on 06/29/2017 negative, final report. -Left hip surgical site wound has healed well without acute findings.  Pain in left hip/opioid use disorder- - judicious use of opioids.  Some conflict early on related to patient wanting increased dose of opioids.   - Last evaluated by Dr. Oswaldo Done on 2/12 who plans to re-assess him in a few days regarding transitioning to Suboxone.  Septic arthritis of the sternoclavicular joint, abscess  -June 15, 2017 :status post irrigation of the right sternoclavicular wound, with a wound VAC placement which has been removed -CT surgery follow-up 2/20 appreciated.  Improving.  No acute findings on examination of sternoclavicular joint.  Repeat CT chest without contrast 2/24 shows no recurrent abscess or abnormal findings.  Multifocal pneumonia -Possible septic emboli to the lungs- resolved.  CT chest 2/24 shows resolution.   IV drug user -Per patient, has been using heroin- [facility] but  relapsed again in a month before the admission -Last heroin use a week ago prior to admission- no withdrawal noted in the hospital -polysubstance abuse counseling  Nicotine abuse -Nicotine  patch -Tobacco cessation counseling-apparently had been smoking repeatedly in the bathroom.  Hypokalemia Replace and follow periodically.  Magnesium 1.9.  Leukopenia -Possibly due to Ancef, discontinued 2/24.  Follow CBC on 2/26.   DVT prophylaxis: sq Lovenox Code Status: Full code Disposition Plan: [facility] until IV antibiotic course complete if patient agrees. Consultants:   CT surgery, ID, ortho, IR Procedures:     Irrigation of chest wall with wound vac change 06/22/17.    Left hip I&D 06/06/17  Left hip I&D on 06/29/2017.  Antimicrobials:  Anti-infectives (From admission, onward)   Start     Dose/Rate Route Frequency Ordered Stop   07/15/17 1600  nafcillin 2 g in sodium chloride 0.9 % 100 mL IVPB     2 g 200 mL/hr over 30 Minutes Intravenous Every 4 hours 07/15/17 1458     06/29/17 1517  gentamicin (GARAMYCIN) injection  Status:  Discontinued       As needed 06/29/17 1518 06/29/17 1714   06/29/17 1512  vancomycin (VANCOCIN) powder  Status:  Discontinued       As needed 06/29/17 1515 06/29/17 1714   06/29/17 1450  vancomycin (VANCOCIN) 1,000 mg in sodium chloride 0.9 % 1,000 mL irrigation  Status:  Discontinued       As needed 06/29/17 1451 06/29/17 1714   06/29/17 1345  vancomycin (VANCOCIN) 1,000 mg in sodium chloride 0.9 % 1,000 mL irrigation      Irrigation To Surgery 06/29/17 1330 06/30/17 1345   06/15/17 0816  vancomycin (VANCOCIN) powder  Status:  Discontinued       As needed 06/15/17 0817 06/15/17 0854   06/07/17 1400  cefUROXime (ZINACEF) 1.5 g in dextrose 5 % 50 mL IVPB     1.5 g 100 mL/hr over 30 Minutes Intravenous To ShortStay Surgical 06/07/17 0125 06/07/17 1527   06/07/17 0745  vancomycin (VANCOCIN) 1,000 mg in sodium chloride 0.9 % 1,000 mL irrigation      Irrigation To Surgery 06/07/17 0744 06/07/17 1513   06/06/17 1639  gentamicin (GARAMYCIN) injection  Status:  Discontinued       As needed 06/06/17 1640 06/06/17 1715   06/06/17 1638   vancomycin (VANCOCIN) powder  Status:  Discontinued       As needed 06/06/17 1639 06/06/17 1715   06/06/17 1000  ceFAZolin (ANCEF) IVPB 2g/100 mL premix  Status:  Discontinued     2 g 200 mL/hr over 30 Minutes Intravenous Every 8 hours 06/06/17 0911 07/15/17 1457   06/06/17 0000  vancomycin (VANCOCIN) IVPB 1000 mg/200 mL premix  Status:  Discontinued     1,000 mg 200 mL/hr over 60 Minutes Intravenous Every 8 hours 06/05/17 2211 06/06/17 0911   06/05/17 2330  piperacillin-tazobactam (ZOSYN) IVPB 3.375 g  Status:  Discontinued     3.375 g 12.5 mL/hr over 240 Minutes Intravenous Every 8 hours 06/05/17 2211 06/06/17 0911   06/05/17 2200  cefTRIAXone (ROCEPHIN) 1 g in dextrose 5 % 50 mL IVPB  Status:  Discontinued     1 g 100 mL/hr over 30 Minutes Intravenous Every 24 hours 06/05/17 2140 06/05/17 2202   06/05/17 2200  azithromycin (ZITHROMAX) 500 mg in dextrose 5 % 250 mL IVPB  Status:  Discontinued     500 mg 250 mL/hr over 60 Minutes Intravenous Every 24  hours 06/05/17 2140 06/06/17 0911   06/05/17 1600  vancomycin (VANCOCIN) 1,500 mg in sodium chloride 0.9 % 500 mL IVPB     1,500 mg 250 mL/hr over 120 Minutes Intravenous  Once 06/05/17 1542 06/05/17 1855   06/05/17 1515  piperacillin-tazobactam (ZOSYN) IVPB 3.375 g     3.375 g 100 mL/hr over 30 Minutes Intravenous  Once 06/05/17 1512 06/05/17 1557       Objective: Vitals:   07/15/17 2059 07/16/17 0516 07/16/17 0710 07/16/17 0901  BP: 108/64 (!) 101/55  (!) 74/45  Pulse: (!) 109 96    Resp: 18 18    Temp: 98.6 F (37 C) (!) 102 F (38.9 C) 98.8 F (37.1 C)   TempSrc: Oral Oral Oral   SpO2: 98% 98%    Weight:      Height:        Intake/Output Summary (Last 24 hours) at 07/16/2017 1403 Last data filed at 07/16/2017 1610 Gross per 24 hour  Intake 520 ml  Output -  Net 520 ml   Filed Weights   06/05/17 1407 06/06/17 0844 06/29/17 0557  Weight: 68 kg (150 lb) 70.3 kg (154 lb 15.7 oz) 70.4 kg (155 lb 3.3 oz)   Blood  pressure recheck by RN at 9:10 AM: 90/52. Examination: General exam: Pleasant young male, moderately built and nourished, sitting up comfortably in bed.  Does not look septic or toxic.  Does not appear in any distress. Respiratory system: Clear to auscultation.  No increased work of breathing.  Stable without change Cardiovascular system: S1 and S2 heard, RRR.  No JVD, murmurs or pedal edema.  Stable without change. Gastrointestinal system: Abdomen soft, non-tender, nondistended. Normal bowel sound. No organomegaly.  Stable without change. Central nervous system: Alert and oriented. No focal neurological deficits. Left hip: Left hip surgical site has healed without acute findings.   Skin: Sternoclavicular area wound without acute findings and is packed. Extremities: Right upper arm PICC line without acute findings.   Data Reviewed: I have personally reviewed following labs and imaging studies  CBC: Recent Labs  Lab 07/13/17 0430 07/16/17 0346  WBC 7.9 3.5*  NEUTROABS  --  2.3  HGB 10.7* 10.2*  HCT 32.9* 31.0*  MCV 90.9 87.1  PLT 242 157   Basic Metabolic Panel: Recent Labs  Lab 07/10/17 0507 07/13/17 0430 07/16/17 0346  NA  --  133* 131*  K  --  3.3* 3.3*  CL  --  97* 99*  CO2  --  26 25  GLUCOSE  --  119* 141*  BUN  --  5* 5*  CREATININE 0.46* 0.55* 0.56*  CALCIUM  --  8.8* 8.0*  MG  --  1.9  --    GFR: Estimated Creatinine Clearance: 128.3 mL/min (A) (by C-G formula based on SCr of 0.56 mg/dL (L)).       Radiology Studies: Ct Chest Wo Contrast  Result Date: 07/15/2017 CLINICAL DATA:  Multifocal pneumonia Neck abscess IV drug user Septic arthritis of sternoclavicular joint, right (HCCFever EXAM: CT CHEST WITHOUT CONTRAST TECHNIQUE: Multidetector CT imaging of the chest was performed following the standard protocol without IV contrast. COMPARISON:  Chest radiograph, 07/14/2017.  Chest CT, 06/05/2017. FINDINGS: Cardiovascular: Heart is normal size and  configuration. No pericardial effusion. Great vessels are unremarkable. Mediastinum/Nodes: No neck base or axillary masses or adenopathy. No mediastinal or hilar masses or adenopathy. Trachea is widely patent. Esophagus is unremarkable. Lungs/Pleura: Clear lungs. Previously seen areas of lung opacity have resolved. No  lung masses or nodules. No pleural effusion or pneumothorax. Upper Abdomen: No acute abnormality. Musculoskeletal: The previously seen mass in the right anterior neck base has been removed. There is now a soft tissue defect that extends from above the jugular notch to the right sternoclavicular joint. Air within the right sternoclavicular joint is contiguous with the soft tissue defect. There is no bone resorption to suggest osteomyelitis. No acute fracture. No osteoblastic or osteolytic lesions. IMPRESSION: 1. Status post surgical resection of the previously described neck base mass. There is now an open wound with air extending from the defect above the jugular notch to the right sternoclavicular joint. Air within the right sternoclavicular joint is contiguous with the surgical defect. No evidence of a residual mass/abscess. 2. There has been marked improvement in the lungs. The previously noted areas of multifocal lung infection have resolved. The lungs are now clear. Electronically Signed   By: Amie Portland M.D.   On: 07/15/2017 18:01      Scheduled Meds: . enoxaparin (LOVENOX) injection  40 mg Subcutaneous Q24H  . gabapentin  300 mg Oral TID  . zinc sulfate  220 mg Oral Daily   Continuous Infusions: . sodium chloride    . nafcillin IV 2 g (07/16/17 1247)     LOS: 41 days    Time spent in minutes: 25 minutes.  Marcellus Scott, MD, FACP, [facility]. [facility] Pager [phone]  If 7PM-7AM, please contact night-coverage [email or web address] Password [facility] 07/16/2017, 2:03 PM

## 2017-07-16 NOTE — Progress Notes (Signed)
BP rechecked and 90/52, pulse 87, O2sat 99% RA, temp 98.2. Instructed pt to drink plenty of fluids.

## 2017-07-16 NOTE — Progress Notes (Signed)
Pt called me and stated he felt faint, BP74/55.

## 2017-07-17 LAB — CBC
HCT: 30.2 % — ABNORMAL LOW (ref 39.0–52.0)
Hemoglobin: 9.8 g/dL — ABNORMAL LOW (ref 13.0–17.0)
MCH: 28.7 pg (ref 26.0–34.0)
MCHC: 32.5 g/dL (ref 30.0–36.0)
MCV: 88.3 fL (ref 78.0–100.0)
PLATELETS: 154 10*3/uL (ref 150–400)
RBC: 3.42 MIL/uL — ABNORMAL LOW (ref 4.22–5.81)
RDW: 14.1 % (ref 11.5–15.5)
WBC: 5.2 10*3/uL (ref 4.0–10.5)

## 2017-07-17 LAB — BASIC METABOLIC PANEL
ANION GAP: 11 (ref 5–15)
CALCIUM: 8.1 mg/dL — AB (ref 8.9–10.3)
CO2: 21 mmol/L — ABNORMAL LOW (ref 22–32)
Chloride: 102 mmol/L (ref 101–111)
Creatinine, Ser: 0.58 mg/dL — ABNORMAL LOW (ref 0.61–1.24)
GFR calc Af Amer: >60 mL/min (ref >60)
GLUCOSE: 166 mg/dL — AB (ref 65–99)
Potassium: 3.2 mmol/L — ABNORMAL LOW (ref 3.5–5.1)
Sodium: 134 mmol/L — ABNORMAL LOW (ref 135–145)

## 2017-07-17 MED ORDER — POTASSIUM CHLORIDE CRYS ER 20 MEQ PO TBCR
40.0000 meq | EXTENDED_RELEASE_TABLET | ORAL | Status: AC
  Administered 2017-07-17: 40 meq via ORAL
  Filled 2017-07-17 (×2): qty 2

## 2017-07-17 NOTE — Progress Notes (Signed)
ID PROGRESS NOTE  36yo M with MSSA disseminated infection   Previously on cefazolin started to have fevers, but no new source found. Blood cx NGTD at 48hrs. Patient has been changed to nafcillin which appears he is tolerating. He had mild leukopenia which I question that it may be due to cefazolin. For now continue on nafcillin.

## 2017-07-17 NOTE — Progress Notes (Signed)
PROGRESS NOTE    Clifford BoschChristopher Tucker   ZOX:[id number]  DOB: 09-May-1981  DOA: 06/05/2017 PCP: Patient, No Pcp Per   Brief Narrative:  Clifford BoschChristopher Bufkin is a 37 y.o.malewith medical history significant forIV drug abuse and recurrent skin and soft tissue infections. He presented with neck/hip pain found to have a neck abscess, septic arthritis of the hip and now MSSA bacteremia. CT surgery, orthopedic surgery and infectious disease consulted.  Plan is for patient to complete his IV antibiotics on 08/03/2017.  Patient remains stable without acute issues. Spiked intermittent high fever since 2/22 without clear new source.  Chest x-ray and urine microscopy negative.  Blood cultures x2 from 2/23: Negative to date. Fevers are better..   Subjective: Feels tired. Fevers better. As per floor nursing leadership, patient continues to smoking in room despite warnings.  Assessment & Plan:  Bacteremia due to methicillin susceptible Staphylococcus aureus (MSSA) in the setting of IV drug use -Blood cultures 2/2+ for MSSA   -Patient was originally placed on vancomycin and Zosyn, transitioned to IV Ancef -Sternal aspirate showed MSSA, left hip wound cultures also MSSA -2D echo with no vegetation.  - PICC placed- cannot go home with PICC due to IVDA - per social work, he cannot go to SNF either as he is suspected to be tampering with his IV and also has been smoking in the bathroom - Appreciate infectious disease input. -Complete IV antibiotics on 08/03/2017 >as per nursing, some confusion regarding the exact end date.  Will request pharmacy to advise.. -Patient spiking intermittent fevers since 2/22.  Unclear source.  Chest x-ray & urine microscopy negative.  Blood cultures drawn 2/23-Negative to date.  Influenza panel PCR negative.  CT chest without contrast unremarkable.  Ancef changed to nafcillin 2/24. -As per ID follow-up, disseminated MSSA bacteremia/presumed endocarditis due to sternoclavicular  joint septic arthritis and left iliopsoas abscess.  If persisting fever or positive blood cultures, need to pull out PICC line and consider TEE to rule out endocarditis.  IV normal saline to address insensible loss from fevers. - Fevers are better.   Left hip abscess/iliopsoas septic arthritis, infectious myositis s/p I&D 06/06/17 -Status post left hip I&D with cultures done on 1/15, showing staph aureus -Status post drain placement 1/18 for iliacus abscess which was removed on 06/14/17 -Doppler ultrasound of the left lower extremity negative for DVT - he stated he had been having persistent pain and tenderness in the left hip.  Orthopedics reevaluated. - MRI showed fluid collection around the hip - 2/7- The patient was taken to the OR for another I and D and wound VAC removed. -Cultures taken on 06/29/2017 negative, final report. -Left hip surgical site wound has healed well without acute findings.  Pain in left hip/opioid use disorder- - judicious use of opioids.  Some conflict early on related to patient wanting increased dose of opioids.   - Last evaluated by Dr. Oswaldo DoneVincent on 2/12 who plans to re-assess him in a few days regarding transitioning to Suboxone.  Septic arthritis of the sternoclavicular joint, abscess  -June 15, 2017 :status post irrigation of the right sternoclavicular wound, with a wound VAC placement which has been removed -CT surgery follow-up 2/20 appreciated.  Improving.  No acute findings on examination of sternoclavicular joint.  Repeat CT chest without contrast 2/24 shows no recurrent abscess or abnormal findings.  Multifocal pneumonia -Possible septic emboli to the lungs- resolved.  CT chest 2/24 shows resolution.   IV drug user -Per patient, has been using heroin- he  [facility] but relapsed again in a month before the admission -Last heroin use a week ago prior to admission- no withdrawal noted in the hospital -polysubstance abuse  counseling  Nicotine abuse -Nicotine patch -Tobacco cessation counseling-apparently had been smoking repeatedly in the bathroom.  Hypokalemia Persisting. Replace aggressively today and follow up.  Magnesium 1.9.  Leukopenia -Possibly due to Ancef, discontinued 2/24.  Resolved.  Anemia - Likely chronic disease. Stable.   DVT prophylaxis: sq Lovenox Code Status: Full code Disposition Plan: will stay at [facility] until IV antibiotic course complete if patient agrees. Consultants:   CT surgery, ID, ortho, IR Procedures:     Irrigation of chest wall with wound vac change 06/22/17.    Left hip I&D 06/06/17  Left hip I&D on 06/29/2017.  Antimicrobials:  Anti-infectives (From admission, onward)   Start     Dose/Rate Route Frequency Ordered Stop   07/15/17 1600  nafcillin 2 g in sodium chloride 0.9 % 100 mL IVPB     2 g 200 mL/hr over 30 Minutes Intravenous Every 4 hours 07/15/17 1458     06/29/17 1517  gentamicin (GARAMYCIN) injection  Status:  Discontinued       As needed 06/29/17 1518 06/29/17 1714   06/29/17 1512  vancomycin (VANCOCIN) powder  Status:  Discontinued       As needed 06/29/17 1515 06/29/17 1714   06/29/17 1450  vancomycin (VANCOCIN) 1,000 mg in sodium chloride 0.9 % 1,000 mL irrigation  Status:  Discontinued       As needed 06/29/17 1451 06/29/17 1714   06/29/17 1345  vancomycin (VANCOCIN) 1,000 mg in sodium chloride 0.9 % 1,000 mL irrigation      Irrigation To Surgery 06/29/17 1330 06/30/17 1345   06/15/17 0816  vancomycin (VANCOCIN) powder  Status:  Discontinued       As needed 06/15/17 0817 06/15/17 0854   06/07/17 1400  cefUROXime (ZINACEF) 1.5 g in dextrose 5 % 50 mL IVPB     1.5 g 100 mL/hr over 30 Minutes Intravenous To ShortStay Surgical 06/07/17 0125 06/07/17 1527   06/07/17 0745  vancomycin (VANCOCIN) 1,000 mg in sodium chloride 0.9 % 1,000 mL irrigation      Irrigation To Surgery 06/07/17 0744 06/07/17 1513   06/06/17 1639  gentamicin (GARAMYCIN)  injection  Status:  Discontinued       As needed 06/06/17 1640 06/06/17 1715   06/06/17 1638  vancomycin (VANCOCIN) powder  Status:  Discontinued       As needed 06/06/17 1639 06/06/17 1715   06/06/17 1000  ceFAZolin (ANCEF) IVPB 2g/100 mL premix  Status:  Discontinued     2 g 200 mL/hr over 30 Minutes Intravenous Every 8 hours 06/06/17 0911 07/15/17 1457   06/06/17 0000  vancomycin (VANCOCIN) IVPB 1000 mg/200 mL premix  Status:  Discontinued     1,000 mg 200 mL/hr over 60 Minutes Intravenous Every 8 hours 06/05/17 2211 06/06/17 0911   06/05/17 2330  piperacillin-tazobactam (ZOSYN) IVPB 3.375 g  Status:  Discontinued     3.375 g 12.5 mL/hr over 240 Minutes Intravenous Every 8 hours 06/05/17 2211 06/06/17 0911   06/05/17 2200  cefTRIAXone (ROCEPHIN) 1 g in dextrose 5 % 50 mL IVPB  Status:  Discontinued     1 g 100 mL/hr over 30 Minutes Intravenous Every 24 hours 06/05/17 2140 06/05/17 2202   06/05/17 2200  azithromycin (ZITHROMAX) 500 mg in dextrose 5 % 250 mL IVPB  Status:  Discontinued  500 mg 250 mL/hr over 60 Minutes Intravenous Every 24 hours 06/05/17 2140 06/06/17 0911   06/05/17 1600  vancomycin (VANCOCIN) 1,500 mg in sodium chloride 0.9 % 500 mL IVPB     1,500 mg 250 mL/hr over 120 Minutes Intravenous  Once 06/05/17 1542 06/05/17 1855   06/05/17 1515  piperacillin-tazobactam (ZOSYN) IVPB 3.375 g     3.375 g 100 mL/hr over 30 Minutes Intravenous  Once 06/05/17 1512 06/05/17 1557       Objective: Vitals:   07/16/17 2037 07/17/17 0530 07/17/17 1000 07/17/17 1414  BP: 105/60 (!) 108/54  (!) 98/44  Pulse: 88 90  79  Resp: 14 15  18   Temp: 98.9 F (37.2 C) 99.4 F (37.4 C) 100 F (37.8 C) 98.2 F (36.8 C)  TempSrc: Oral Axillary Oral Oral  SpO2: 100% 100%  99%  Weight:      Height:       No intake or output data in the 24 hours ending 07/17/17 1547 Filed Weights   06/05/17 1407 06/06/17 0844 06/29/17 0557  Weight: 68 kg (150 lb) 70.3 kg (154 lb 15.7 oz) 70.4 kg  (155 lb 3.3 oz)   No change in exam since 2/24. Examination: General exam: Pleasant young male, moderately built and nourished, sitting up comfortably in bed.  Does not look septic or toxic.  Does not appear in any distress. Respiratory system: Clear to auscultation.  No increased work of breathing.  Stable without change Cardiovascular system: S1 and S2 heard, RRR.  No JVD, murmurs or pedal edema.  Stable without change. Gastrointestinal system: Abdomen soft, non-tender, nondistended. Normal bowel sound. No organomegaly.  Stable without change. Central nervous system: Alert and oriented. No focal neurological deficits. Left hip: Left hip surgical site has healed without acute findings.   Skin: Sternoclavicular area wound without acute findings and is packed. Extremities: Right upper arm PICC line without acute findings.   Data Reviewed: I have personally reviewed following labs and imaging studies  CBC: Recent Labs  Lab 07/13/17 0430 07/16/17 0346 07/17/17 0349  WBC 7.9 3.5* 5.2  NEUTROABS  --  2.3  --   HGB 10.7* 10.2* 9.8*  HCT 32.9* 31.0* 30.2*  MCV 90.9 87.1 88.3  PLT 242 157 154   Basic Metabolic Panel: Recent Labs  Lab 07/13/17 0430 07/16/17 0346 07/17/17 0349  NA 133* 131* 134*  K 3.3* 3.3* 3.2*  CL 97* 99* 102  CO2 26 25 21*  GLUCOSE 119* 141* 166*  BUN 5* 5* <5*  CREATININE 0.55* 0.56* 0.58*  CALCIUM 8.8* 8.0* 8.1*  MG 1.9  --   --    GFR: Estimated Creatinine Clearance: 128.3 mL/min (A) (by C-G formula based on SCr of 0.58 mg/dL (L)).       Radiology Studies: Ct Chest Wo Contrast  Result Date: 07/15/2017 CLINICAL DATA:  Multifocal pneumonia Neck abscess IV drug user Septic arthritis of sternoclavicular joint, right (HCCFever EXAM: CT CHEST WITHOUT CONTRAST TECHNIQUE: Multidetector CT imaging of the chest was performed following the standard protocol without IV contrast. COMPARISON:  Chest radiograph, 07/14/2017.  Chest CT, 06/05/2017. FINDINGS:  Cardiovascular: Heart is normal size and configuration. No pericardial effusion. Great vessels are unremarkable. Mediastinum/Nodes: No neck base or axillary masses or adenopathy. No mediastinal or hilar masses or adenopathy. Trachea is widely patent. Esophagus is unremarkable. Lungs/Pleura: Clear lungs. Previously seen areas of lung opacity have resolved. No lung masses or nodules. No pleural effusion or pneumothorax. Upper Abdomen: No acute abnormality. Musculoskeletal: The  previously seen mass in the right anterior neck base has been removed. There is now a soft tissue defect that extends from above the jugular notch to the right sternoclavicular joint. Air within the right sternoclavicular joint is contiguous with the soft tissue defect. There is no bone resorption to suggest osteomyelitis. No acute fracture. No osteoblastic or osteolytic lesions. IMPRESSION: 1. Status post surgical resection of the previously described neck base mass. There is now an open wound with air extending from the defect above the jugular notch to the right sternoclavicular joint. Air within the right sternoclavicular joint is contiguous with the surgical defect. No evidence of a residual mass/abscess. 2. There has been marked improvement in the lungs. The previously noted areas of multifocal lung infection have resolved. The lungs are now clear. Electronically Signed   By: Amie Portland M.D.   On: 07/15/2017 18:01      Scheduled Meds: . enoxaparin (LOVENOX) injection  40 mg Subcutaneous Q24H  . gabapentin  300 mg Oral TID  . potassium chloride  40 mEq Oral Q4H  . zinc sulfate  220 mg Oral Daily   Continuous Infusions: . nafcillin IV Stopped (07/17/17 1419)     LOS: 42 days    Time spent in minutes: 25 minutes.  Marcellus Scott, MD, FACP, [facility]. [facility] Pager [phone]  If 7PM-7AM, please contact night-coverage [email or web address] Password [facility] 07/17/2017, 3:47 PM

## 2017-07-18 LAB — BASIC METABOLIC PANEL
ANION GAP: 10 (ref 5–15)
BUN: <5 mg/dL — ABNORMAL LOW (ref 6–20)
CALCIUM: 8.2 mg/dL — AB (ref 8.9–10.3)
CO2: 23 mmol/L (ref 22–32)
CREATININE: 0.57 mg/dL — AB (ref 0.61–1.24)
Chloride: 102 mmol/L (ref 101–111)
GFR calc non Af Amer: >60 mL/min (ref >60)
Glucose, Bld: 150 mg/dL — ABNORMAL HIGH (ref 65–99)
Potassium: 2.7 mmol/L — CL (ref 3.5–5.1)
SODIUM: 135 mmol/L (ref 135–145)

## 2017-07-18 LAB — MAGNESIUM: MAGNESIUM: 1.8 mg/dL (ref 1.7–2.4)

## 2017-07-18 MED ORDER — POTASSIUM CHLORIDE CRYS ER 20 MEQ PO TBCR
40.0000 meq | EXTENDED_RELEASE_TABLET | ORAL | Status: AC
  Administered 2017-07-18: 40 meq via ORAL
  Filled 2017-07-18: qty 2

## 2017-07-18 MED ORDER — POTASSIUM CHLORIDE CRYS ER 20 MEQ PO TBCR
40.0000 meq | EXTENDED_RELEASE_TABLET | ORAL | Status: AC
  Administered 2017-07-18 (×2): 40 meq via ORAL
  Filled 2017-07-18 (×2): qty 2

## 2017-07-18 NOTE — Progress Notes (Signed)
Per ID note on 2/11, last day of antibiotics to be 08/09/2017.  Added end date to current nafcillin order. Please change if the date has been updated (did not see anything more recent to give guidance).  Pharmacy will continue to follow peripherally.   Miley Blanchett D. Rainey Kahrs, PharmD, BCPS Clinical Pharmacist 07/18/2017 2:44 PM

## 2017-07-18 NOTE — Progress Notes (Signed)
Notified md of lab result  K+ 2.7, new order received.

## 2017-07-18 NOTE — Progress Notes (Signed)
Patient refused dressing change this am.

## 2017-07-18 NOTE — Progress Notes (Signed)
ID PROGRESS NOTE  Fever curve improving. Only changes made had been changing to nafcillin. Cbc appears improved, not having leukopenia.  -finish course with nafcillin - will continue to monitor vitals to see if further infectious work up needs to be done  Winn-DixieCynthia B. Drue SecondSnider MD MPH [facility] [phone]

## 2017-07-18 NOTE — Progress Notes (Signed)
PROGRESS NOTE    Clifford Tucker   WUJ:[id number]  DOB: [birth year]  DOA: 06/05/2017 PCP: Patient, No Pcp Per   Brief Narrative:  Clifford Tucker is a 37 y.o.malewith medical history significant forIV drug abuse and recurrent skin and soft tissue infections. He presented with neck/hip pain found to have a neck abscess, septic arthritis of the hip and now MSSA bacteremia. CT surgery, orthopedic surgery and infectious disease consulted.  Plan is for patient to complete his IV antibiotics on 08/09/2017.  Patient remains stable without acute issues. Spiked intermittent high fever since 2/22 without clear new source.  Chest x-ray and urine microscopy negative.  Blood cultures x2 from 2/23: Negative to date. Fevers are better..   Subjective: States that he has more chills, sweats and fevers at night.  Denies any other complaints.  Discussed with RN at bedside and requested checking temperatures more often at night.  Patient declining rectal temperature check.  Assessment & Plan:  Bacteremia due to methicillin susceptible Staphylococcus aureus (MSSA) in the setting of IV drug use -Blood cultures 2/2+ for MSSA   -Patient was originally placed on vancomycin and Zosyn, transitioned to IV Ancef -Sternal aspirate showed MSSA, left hip wound cultures also MSSA -2D echo with no vegetation.  - PICC placed- cannot go home with PICC due to IVDA - per social work, he cannot go to SNF either as he is suspected to be tampering with his IV and also has been smoking in the bathroom - Appreciate infectious disease input. -Antibiotics completion date: As per pharmacy input 2/26, per ID note on 2/11, last day of antibiotics to be 08/09/17. -Patient spiking intermittent fevers since 2/22.  Unclear source.  Chest x-ray & urine microscopy negative.  Blood cultures drawn 2/23-Negative to date.  Influenza panel PCR negative.  CT chest without contrast unremarkable.  Ancef changed to nafcillin 2/24. 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  If persisting fever or positive blood cultures, need to pull out PICC line and consider TEE to rule out endocarditis.  IV normal saline to address insensible loss from fevers. - Fevers are better but still has low-grade fevers up to 100.7 F.  Repeat blood cultures from 2/23 remain negative to date.  Management per ID recommendations.   Left hip abscess/iliopsoas septic arthritis, infectious myositis s/p I&D 06/06/17 -Status post left hip I&D with cultures done on 1/15, showing staph aureus -Status post drain placement 1/18 for iliacus abscess which was removed on 06/14/17 -Doppler ultrasound of the left lower extremity negative for DVT - he stated he had been having persistent pain and tenderness in the left hip.  Orthopedics reevaluated. - MRI showed fluid collection around the hip - 2/7- The patient was taken to the OR for another I and D and wound VAC removed. -Cultures taken on 06/29/2017 negative, final report. -Left hip surgical site wound has healed well without acute findings.  Pain in left hip/opioid use disorder- - judicious use of opioids.  Some conflict early on related to patient wanting increased dose of opioids.   - Last evaluated by Dr. Oswaldo Done on 2/12 who planned to re-assess him re transitioning to Suboxone.  Septic arthritis of the sternoclavicular joint, abscess  -June 15, 2017 :status post irrigation of the right sternoclavicular wound, with a wound VAC placement which has been removed -CT surgery follow-up 2/20 appreciated.  Improving.  No acute findings on examination of sternoclavicular joint.  Repeat CT chest  without contrast 2/24 shows no recurrent abscess or abnormal findings.  Multifocal pneumonia -Possible septic emboli to the lungs- resolved.  CT chest 2/24 shows resolution.   IV drug user -Per patient, has been using  heroin- [facility] but relapsed again in a month before the admission -Last heroin use a week ago prior to admission- no withdrawal noted in the hospital -polysubstance abuse counseling  Nicotine abuse -Nicotine patch -Tobacco cessation counseling-apparently had been smoking repeatedly in the bathroom.  Hypokalemia Persistent hypokalemia.  Unclear etiology.?  Renal wasting.  Replace aggressively today.  Patient refused a.m. dose of potassium supplements.  Reordered.  Magnesium 1.8.  Follow BMP in a.m.  Leukopenia -Possibly due to Ancef, discontinued 2/24.  Resolved.  Anemia - Likely chronic disease. Stable.   DVT prophylaxis: sq Lovenox Code Status: Full code Disposition Plan: will stay at [facility] until IV antibiotic course complete if patient agrees. Consultants:   CT surgery, ID, ortho, IR Procedures:     Irrigation of chest wall with wound vac change 06/22/17.    Left hip I&D 06/06/17  Left hip I&D on 06/29/2017.  Antimicrobials:  Anti-infectives (From admission, onward)   Start     Dose/Rate Route Frequency Ordered Stop   07/15/17 1600  nafcillin 2 g in sodium chloride 0.9 % 100 mL IVPB     2 g 200 mL/hr over 30 Minutes Intravenous Every 4 hours 07/15/17 1458 08/09/17 2359   06/29/17 1517  gentamicin (GARAMYCIN) injection  Status:  Discontinued       As needed 06/29/17 1518 06/29/17 1714   06/29/17 1512  vancomycin (VANCOCIN) powder  Status:  Discontinued       As needed 06/29/17 1515 06/29/17 1714   06/29/17 1450  vancomycin (VANCOCIN) 1,000 mg in sodium chloride 0.9 % 1,000 mL irrigation  Status:  Discontinued       As needed 06/29/17 1451 06/29/17 1714   06/29/17 1345  vancomycin (VANCOCIN) 1,000 mg in sodium chloride 0.9 % 1,000 mL irrigation      Irrigation To Surgery 06/29/17 1330 06/30/17 1345   06/15/17 0816  vancomycin (VANCOCIN) powder  Status:  Discontinued       As needed 06/15/17 0817 06/15/17 0854   06/07/17 1400  cefUROXime  (ZINACEF) 1.5 g in dextrose 5 % 50 mL IVPB     1.5 g 100 mL/hr over 30 Minutes Intravenous To ShortStay Surgical 06/07/17 0125 06/07/17 1527   06/07/17 0745  vancomycin (VANCOCIN) 1,000 mg in sodium chloride 0.9 % 1,000 mL irrigation      Irrigation To Surgery 06/07/17 0744 06/07/17 1513   06/06/17 1639  gentamicin (GARAMYCIN) injection  Status:  Discontinued       As needed 06/06/17 1640 06/06/17 1715   06/06/17 1638  vancomycin (VANCOCIN) powder  Status:  Discontinued       As needed 06/06/17 1639 06/06/17 1715   06/06/17 1000  ceFAZolin (ANCEF) IVPB 2g/100 mL premix  Status:  Discontinued     2 g 200 mL/hr over 30 Minutes Intravenous Every 8 hours 06/06/17 0911 07/15/17 1457   06/06/17 0000  vancomycin (VANCOCIN) IVPB 1000 mg/200 mL premix  Status:  Discontinued     1,000 mg 200 mL/hr over 60 Minutes Intravenous Every 8 hours 06/05/17 2211 06/06/17 0911   06/05/17 2330  piperacillin-tazobactam (ZOSYN) IVPB 3.375 g  Status:  Discontinued     3.375 g 12.5 mL/hr over 240 Minutes Intravenous Every 8 hours 06/05/17 2211 06/06/17 0911  06/05/17 2200  cefTRIAXone (ROCEPHIN) 1 g in dextrose 5 % 50 mL IVPB  Status:  Discontinued     1 g 100 mL/hr over 30 Minutes Intravenous Every 24 hours 06/05/17 2140 06/05/17 2202   06/05/17 2200  azithromycin (ZITHROMAX) 500 mg in dextrose 5 % 250 mL IVPB  Status:  Discontinued     500 mg 250 mL/hr over 60 Minutes Intravenous Every 24 hours 06/05/17 2140 06/06/17 0911   06/05/17 1600  vancomycin (VANCOCIN) 1,500 mg in sodium chloride 0.9 % 500 mL IVPB     1,500 mg 250 mL/hr over 120 Minutes Intravenous  Once 06/05/17 1542 06/05/17 1855   06/05/17 1515  piperacillin-tazobactam (ZOSYN) IVPB 3.375 g     3.375 g 100 mL/hr over 30 Minutes Intravenous  Once 06/05/17 1512 06/05/17 1557       Objective: Vitals:   07/17/17 2129 07/18/17 0206 07/18/17 0614 07/18/17 1408  BP: 113/72  (!) 91/55 (!) 89/50  Pulse: 88  84 82  Resp: 17  18 18   Temp: (!) 100.7  F (38.2 C) 100.2 F (37.9 C) 97.9 F (36.6 C) 98.8 F (37.1 C)  TempSrc: Oral Oral Oral Oral  SpO2: 100%  99% 98%  Weight:      Height:        Intake/Output Summary (Last 24 hours) at 07/18/2017 1526 Last data filed at 07/18/2017 1300 Gross per 24 hour  Intake 360 ml  Output -  Net 360 ml   Filed Weights   06/05/17 1407 06/06/17 0844 06/29/17 0557  Weight: 68 kg (150 lb) 70.3 kg (154 lb 15.7 oz) 70.4 kg (155 lb 3.3 oz)    Examination: General exam: Pleasant young male, moderately built and nourished, lying comfortably in bed.  Does not appear in any distress. Respiratory system: Clear to auscultation.  No increased work of breathing. Cardiovascular system: S1 and S2 heard, RRR.  No JVD, murmurs or pedal edema.  Stable without change. Gastrointestinal system: Abdomen soft, non-tender, nondistended. Normal bowel sound. No organomegaly.  Stable without change. Central nervous system: Alert and oriented. No focal neurological deficits. Left hip: Left hip surgical site has healed without acute findings.   Skin: Sternoclavicular area wound without acute findings and is packed. Extremities: Right upper arm PICC line without acute findings.   Data Reviewed: I have personally reviewed following labs and imaging studies  CBC: Recent Labs  Lab 07/13/17 0430 07/16/17 0346 07/17/17 0349  WBC 7.9 3.5* 5.2  NEUTROABS  --  2.3  --   HGB 10.7* 10.2* 9.8*  HCT 32.9* 31.0* 30.2*  MCV 90.9 87.1 88.3  PLT 242 157 154   Basic Metabolic Panel: Recent Labs  Lab 07/13/17 0430 07/16/17 0346 07/17/17 0349 07/18/17 0426  NA 133* 131* 134* 135  K 3.3* 3.3* 3.2* 2.7*  CL 97* 99* 102 102  CO2 26 25 21* 23  GLUCOSE 119* 141* 166* 150*  BUN 5* 5* <5* <5*  CREATININE 0.55* 0.56* 0.58* 0.57*  CALCIUM 8.8* 8.0* 8.1* 8.2*  MG 1.9  --   --  1.8   GFR: Estimated Creatinine Clearance: 128.3 mL/min (A) (by C-G formula based on SCr of 0.57 mg/dL (L)).       Radiology Studies: No  results found.    Scheduled Meds: . enoxaparin (LOVENOX) injection  40 mg Subcutaneous Q24H  . gabapentin  300 mg Oral TID  . potassium chloride  40 mEq Oral Q4H  . zinc sulfate  220 mg Oral Daily  Continuous Infusions: . nafcillin IV 2 g (07/18/17 1255)     LOS: 43 days    Time spent in minutes: 25 minutes.  Marcellus Scott, MD, FACP, [facility]. [facility] Pager [phone]  If 7PM-7AM, please contact night-coverage [email or web address] Password [facility] 07/18/2017, 3:26 PM

## 2017-07-19 DIAGNOSIS — E876 Hypokalemia: Secondary | ICD-10-CM

## 2017-07-19 DIAGNOSIS — M869 Osteomyelitis, unspecified: Secondary | ICD-10-CM

## 2017-07-19 DIAGNOSIS — I2690 Septic pulmonary embolism without acute cor pulmonale: Secondary | ICD-10-CM

## 2017-07-19 DIAGNOSIS — D72819 Decreased white blood cell count, unspecified: Secondary | ICD-10-CM

## 2017-07-19 DIAGNOSIS — F419 Anxiety disorder, unspecified: Secondary | ICD-10-CM

## 2017-07-19 LAB — BASIC METABOLIC PANEL
Anion gap: 8 (ref 5–15)
BUN: 5 mg/dL — ABNORMAL LOW (ref 6–20)
CALCIUM: 8.2 mg/dL — AB (ref 8.9–10.3)
CO2: 23 mmol/L (ref 22–32)
CREATININE: 0.68 mg/dL (ref 0.61–1.24)
Chloride: 100 mmol/L — ABNORMAL LOW (ref 101–111)
GLUCOSE: 95 mg/dL (ref 65–99)
Potassium: 3.6 mmol/L (ref 3.5–5.1)
Sodium: 131 mmol/L — ABNORMAL LOW (ref 135–145)

## 2017-07-19 NOTE — Progress Notes (Signed)
Patient refused 6pm dressing change, will report to night shift.

## 2017-07-19 NOTE — Progress Notes (Signed)
Patient stated to this nurse that he took off the sutures on his left hip surgical wound with the use of nail clipper and suture removal that he saw inside the room, assessed pt's surgical site on left hip, wound is healed, dry and intact, no redness or swelling noted.

## 2017-07-19 NOTE — Progress Notes (Signed)
PROGRESS NOTE    Clifford Tucker   ZOX:[id number]  DOB: 27-Jan-1981  DOA: 06/05/2017 PCP: Patient, No Pcp Per   Brief Narrative:  Clifford Tucker is a 37 y.o.malewith medical history significant forIV drug abuse and recurrent skin and soft tissue infections. He presented with neck/hip pain found to have a neck abscess, septic arthritis of the hip and now MSSA bacteremia. CT surgery, orthopedic surgery and infectious disease consulted.  Plan is for patient to complete his IV antibiotics on 08/09/2017.  Patient remains stable without acute issues. Spiked intermittent high fever since 2/22 without clear new source.  Chest x-ray and urine microscopy negative.  Blood cultures x2 from 2/23: Negative to date after 4 days.  Fever curve better.   Subjective: "I am tired".  No other complaints reported.  Assessment & Plan:  Bacteremia due to methicillin susceptible Staphylococcus aureus (MSSA) in the setting of IV drug use -Blood cultures 2/2+ for MSSA   -Patient was originally placed on vancomycin and Zosyn, transitioned to IV Ancef -Sternal aspirate showed MSSA, left hip wound cultures also MSSA -2D echo with no vegetation.  - PICC placed- cannot go home with PICC due to IVDA - per social work, he cannot go to SNF either as he is suspected to be tampering with his IV and also has been smoking in the bathroom -Antibiotics completion date: As per pharmacy input 2/26, per ID note on 2/11, last day of antibiotics to be 08/09/17. -Patient spiking intermittent fevers since 2/22.  Unclear source.  Chest x-ray & urine microscopy negative.  Blood cultures drawn 2/23-Negative to date.  Influenza panel PCR negative.  CT chest without contrast unremarkable.  Ancef changed to nafcillin 2/24. 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  If persisting fever or positive blood cultures, need to pull out  PICC line and consider TEE to rule out endocarditis.  IV normal saline to address insensible loss from fevers. -Fever curve better.  Blood cultures x2 from 2/23: Negative to date.   Left hip abscess/iliopsoas septic arthritis, infectious myositis s/p I&D 06/06/17 -Status post left hip I&D with cultures done on 1/15, showing staph aureus -Status post drain placement 1/18 for iliacus abscess which was removed on 06/14/17 -Doppler ultrasound of the left lower extremity negative for DVT - he stated he had been having persistent pain and tenderness in the left hip.  Orthopedics reevaluated. - MRI showed fluid collection around the hip - 2/7- The patient was taken to the OR for another I and D and wound VAC removed. -Cultures taken on 06/29/2017 negative, final report. -Left hip surgical site wound has healed well without acute findings.  Pain in left hip/opioid use disorder- - judicious use of opioids.  Some conflict early on related to patient wanting increased dose of opioids.   - Last evaluated by Dr. Oswaldo Done on 2/12 who planned to re-assess him re transitioning to Suboxone.  Septic arthritis of the sternoclavicular joint, abscess  -June 15, 2017 :status post irrigation of the right sternoclavicular wound, with a wound VAC placement which has been removed -CT surgery follow-up 2/20 appreciated.  Improving.  No acute findings on examination of sternoclavicular joint.  Repeat CT chest without contrast 2/24 shows no recurrent abscess or abnormal findings.  Multifocal pneumonia -Possible septic emboli to the lungs- resolved.  CT chest 2/24 shows resolution.   IV drug user -Per patient, has been using heroin- [facility] but relapsed  again in a month before the admission -Last heroin use a week ago prior to admission- no withdrawal noted in the hospital -polysubstance abuse counseling  Nicotine abuse -Nicotine patch -Tobacco cessation counseling-apparently had  been smoking repeatedly in the bathroom.  Hypokalemia Persistent hypokalemia.  Unclear etiology.?  Renal wasting.  Replaced aggressively. Magnesium 1.8.   Potassium finally 3.6 today.  Follow BMP periodically.  Leukopenia -Possibly due to Ancef, discontinued 2/24.  Resolved.  Anemia - Likely chronic disease. Stable.   DVT prophylaxis: sq Lovenox Code Status: Full code Disposition Plan: will stay at [facility] until IV antibiotic course complete if patient agrees. Consultants:   CT surgery, ID, ortho, IR Procedures:     Irrigation of chest wall with wound vac change 06/22/17.    Left hip I&D 06/06/17  Left hip I&D on 06/29/2017.  Antimicrobials:  Anti-infectives (From admission, onward)   Start     Dose/Rate Route Frequency Ordered Stop   07/15/17 1600  nafcillin 2 g in sodium chloride 0.9 % 100 mL IVPB     2 g 200 mL/hr over 30 Minutes Intravenous Every 4 hours 07/15/17 1458 08/09/17 2359   06/29/17 1517  gentamicin (GARAMYCIN) injection  Status:  Discontinued       As needed 06/29/17 1518 06/29/17 1714   06/29/17 1512  vancomycin (VANCOCIN) powder  Status:  Discontinued       As needed 06/29/17 1515 06/29/17 1714   06/29/17 1450  vancomycin (VANCOCIN) 1,000 mg in sodium chloride 0.9 % 1,000 mL irrigation  Status:  Discontinued       As needed 06/29/17 1451 06/29/17 1714   06/29/17 1345  vancomycin (VANCOCIN) 1,000 mg in sodium chloride 0.9 % 1,000 mL irrigation      Irrigation To Surgery 06/29/17 1330 06/30/17 1345   06/15/17 0816  vancomycin (VANCOCIN) powder  Status:  Discontinued       As needed 06/15/17 0817 06/15/17 0854   06/07/17 1400  cefUROXime (ZINACEF) 1.5 g in dextrose 5 % 50 mL IVPB     1.5 g 100 mL/hr over 30 Minutes Intravenous To ShortStay Surgical 06/07/17 0125 06/07/17 1527   06/07/17 0745  vancomycin (VANCOCIN) 1,000 mg in sodium chloride 0.9 % 1,000 mL irrigation      Irrigation To Surgery 06/07/17 0744 06/07/17 1513   06/06/17 1639  gentamicin  (GARAMYCIN) injection  Status:  Discontinued       As needed 06/06/17 1640 06/06/17 1715   06/06/17 1638  vancomycin (VANCOCIN) powder  Status:  Discontinued       As needed 06/06/17 1639 06/06/17 1715   06/06/17 1000  ceFAZolin (ANCEF) IVPB 2g/100 mL premix  Status:  Discontinued     2 g 200 mL/hr over 30 Minutes Intravenous Every 8 hours 06/06/17 0911 07/15/17 1457   06/06/17 0000  vancomycin (VANCOCIN) IVPB 1000 mg/200 mL premix  Status:  Discontinued     1,000 mg 200 mL/hr over 60 Minutes Intravenous Every 8 hours 06/05/17 2211 06/06/17 0911   06/05/17 2330  piperacillin-tazobactam (ZOSYN) IVPB 3.375 g  Status:  Discontinued     3.375 g 12.5 mL/hr over 240 Minutes Intravenous Every 8 hours 06/05/17 2211 06/06/17 0911   06/05/17 2200  cefTRIAXone (ROCEPHIN) 1 g in dextrose 5 % 50 mL IVPB  Status:  Discontinued     1 g 100 mL/hr over 30 Minutes Intravenous Every 24 hours 06/05/17 2140 06/05/17 2202   06/05/17 2200  azithromycin (ZITHROMAX) 500 mg in dextrose 5 % 250 mL  IVPB  Status:  Discontinued     500 mg 250 mL/hr over 60 Minutes Intravenous Every 24 hours 06/05/17 2140 06/06/17 0911   06/05/17 1600  vancomycin (VANCOCIN) 1,500 mg in sodium chloride 0.9 % 500 mL IVPB     1,500 mg 250 mL/hr over 120 Minutes Intravenous  Once 06/05/17 1542 06/05/17 1855   06/05/17 1515  piperacillin-tazobactam (ZOSYN) IVPB 3.375 g     3.375 g 100 mL/hr over 30 Minutes Intravenous  Once 06/05/17 1512 06/05/17 1557       Objective: Vitals:   07/18/17 0614 07/18/17 1408 07/18/17 2202 07/19/17 0548  BP: (!) 91/55 (!) 89/50 92/60 108/62  Pulse: 84 82 80 82  Resp: 18 18 17 18   Temp: 97.9 F (36.6 C) 98.8 F (37.1 C) 98.6 F (37 C) 98.9 F (37.2 C)  TempSrc: Oral Oral Oral Oral  SpO2: 99% 98% 98% 98%  Weight:      Height:        Intake/Output Summary (Last 24 hours) at 07/19/2017 1427 Last data filed at 07/19/2017 1300 Gross per 24 hour  Intake 720 ml  Output -  Net 720 ml   Filed  Weights   06/05/17 1407 06/06/17 0844 06/29/17 0557  Weight: 68 kg (150 lb) 70.3 kg (154 lb 15.7 oz) 70.4 kg (155 lb 3.3 oz)    Examination: No significant change in exam over the last couple days. General exam: Pleasant young male, moderately built and nourished, lying comfortably in bed.  Does not appear in any distress. Respiratory system: Clear to auscultation.  No increased work of breathing. Cardiovascular system: S1 and S2 heard, RRR.  No JVD, murmurs or pedal edema.  Stable without change. Gastrointestinal system: Abdomen soft, non-tender, nondistended. Normal bowel sound. No organomegaly.  Stable without change. Central nervous system: Alert and oriented. No focal neurological deficits. Left hip: Left hip surgical site has healed without acute findings.   Skin: Sternoclavicular area wound without acute findings and is packed. Extremities: Right upper arm PICC line without acute findings.   Data Reviewed: I have personally reviewed following labs and imaging studies  CBC: Recent Labs  Lab 07/13/17 0430 07/16/17 0346 07/17/17 0349  WBC 7.9 3.5* 5.2  NEUTROABS  --  2.3  --   HGB 10.7* 10.2* 9.8*  HCT 32.9* 31.0* 30.2*  MCV 90.9 87.1 88.3  PLT 242 157 154   Basic Metabolic Panel: Recent Labs  Lab 07/13/17 0430 07/16/17 0346 07/17/17 0349 07/18/17 0426 07/19/17 0809  NA 133* 131* 134* 135 131*  K 3.3* 3.3* 3.2* 2.7* 3.6  CL 97* 99* 102 102 100*  CO2 26 25 21* 23 23  GLUCOSE 119* 141* 166* 150* 95  BUN 5* 5* <5* <5* 5*  CREATININE 0.55* 0.56* 0.58* 0.57* 0.68  CALCIUM 8.8* 8.0* 8.1* 8.2* 8.2*  MG 1.9  --   --  1.8  --    GFR: Estimated Creatinine Clearance: 128.3 mL/min (by C-G formula based on SCr of 0.68 mg/dL).       Radiology Studies: No results found.    Scheduled Meds: . enoxaparin (LOVENOX) injection  40 mg Subcutaneous Q24H  . gabapentin  300 mg Oral TID  . zinc sulfate  220 mg Oral Daily   Continuous Infusions: . nafcillin IV 2 g  (07/19/17 1226)     LOS: 44 days    Time spent in minutes: 25 minutes.  Marcellus ScottAnand Tomoki Lucken, MD, FACP, [facility]. [facility] Pager [phone]  If 7PM-7AM, please contact night-coverage  [email or web address] Password [facility] 07/19/2017, 2:27 PM

## 2017-07-20 DIAGNOSIS — F411 Generalized anxiety disorder: Secondary | ICD-10-CM

## 2017-07-20 DIAGNOSIS — R509 Fever, unspecified: Secondary | ICD-10-CM

## 2017-07-20 LAB — CULTURE, BLOOD (ROUTINE X 2)
Culture: NO GROWTH
Special Requests: ADEQUATE

## 2017-07-20 LAB — CBC
HCT: 31.8 % — ABNORMAL LOW (ref 39.0–52.0)
HEMOGLOBIN: 10.5 g/dL — AB (ref 13.0–17.0)
MCH: 28.9 pg (ref 26.0–34.0)
MCHC: 33 g/dL (ref 30.0–36.0)
MCV: 87.6 fL (ref 78.0–100.0)
Platelets: 233 10*3/uL (ref 150–400)
RBC: 3.63 MIL/uL — AB (ref 4.22–5.81)
RDW: 14.7 % (ref 11.5–15.5)
WBC: 7.6 10*3/uL (ref 4.0–10.5)

## 2017-07-20 LAB — CREATININE, SERUM
CREATININE: 0.69 mg/dL (ref 0.61–1.24)
GFR calc Af Amer: >60 mL/min (ref >60)
GFR calc non Af Amer: >60 mL/min (ref >60)

## 2017-07-20 MED ORDER — METOCLOPRAMIDE HCL 5 MG/ML IJ SOLN: 5.0000 mg | Freq: Three times a day (TID) | INTRAMUSCULAR | Status: DC | PRN

## 2017-07-20 MED ORDER — METOCLOPRAMIDE HCL 5 MG PO TABS: 5.0000 mg | ORAL_TABLET | Freq: Three times a day (TID) | ORAL | Status: DC | PRN

## 2017-07-20 MED ORDER — ONDANSETRON HCL 4 MG/2ML IJ SOLN: 4.0000 mg | Freq: Four times a day (QID) | INTRAMUSCULAR | Status: DC | PRN

## 2017-07-20 MED ORDER — ONDANSETRON HCL 4 MG PO TABS: 4.0000 mg | ORAL_TABLET | Freq: Four times a day (QID) | ORAL | Status: DC | PRN

## 2017-07-20 MED ORDER — ACETAMINOPHEN 650 MG RE SUPP: 650.0000 mg | RECTAL | Status: DC | PRN

## 2017-07-20 MED ORDER — ACETAMINOPHEN 325 MG PO TABS
650.0000 mg | ORAL_TABLET | ORAL | Status: DC | PRN
  Administered 2017-07-31: 650 mg via ORAL
  Filled 2017-07-20 (×2): qty 2

## 2017-07-20 NOTE — Progress Notes (Addendum)
[facility]    Date of Admission:  06/05/2017      ID: Clifford Tucker is a 37 y.o. 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 enoxaparin (LOVENOX) injection  40 mg Subcutaneous Q24H  . gabapentin  300 mg Oral TID  . zinc sulfate  220 mg Oral Daily    Objective: Vital signs in last 24 hours: Temp:  [98.9 F (37.2 C)-99.2 F (37.3 C)] 98.9 F (37.2 C) (02/27 2135) Pulse Rate:  [75-82] 80 (02/27 2135) Resp:  [17-18] 18 (02/27 2135) BP: (92-108)/(40-62) 103/60 (02/27 2135) SpO2:  [97 %-99 %] 97 % (02/27 2135) Physical Exam  Constitutional: He is oriented to person, place, and time. He appears under-nourished. No distress.  HENT:  Mouth/Throat: Oropharynx is clear and moist. No oropharyngeal exudate.  Cardiovascular: Normal rate, regular rhythm and normal heart sounds. Exam reveals no gallop and no friction rub.  No murmur heard.  Pulmonary/Chest: Effort normal and breath sounds normal. No respiratory distress. He has no wheezes.  Abdominal: Soft. Bowel sounds are normal. He exhibits no distension. There is no tenderness.  Lymphadenopathy:  He has no cervical adenopathy.  Neurological: He is alert and oriented to person, place, and time.  Skin: Skin is warm and dry. No rash noted. No erythema.  Psychiatric: anxious    Lab Results Recent Labs    07/17/17 0349 07/18/17 0426  07/19/17 0809  WBC 5.2  --   --   HGB 9.8*  --   --   HCT 30.2*  --   --   NA 134* 135 131*  K 3.2* 2.7* 3.6  CL 102 102 100*  CO2 21* 23 23  BUN <5* <5* 5*  CREATININE 0.58* 0.57* 0.68   Microbiology: 2/23 blood cx ngtd Studies/Results: No results found.   Assessment/Plan: Fevers = thought to be due to progression of underlying infection. Repeat blood cx are no growth to date. Possibly thought it may be due to drug fever from cefazolin thus he was changed to nafcillin. Still subjective fevers. He reports occ pleuritic chest pain but does not correlate to worsening pulm process on CT from a few days ago. Recommend to get TEE scheduled for Friday. Will decide if need to extend length of therapy  Leukopenia = improved  Hypokalemia = still requiring intermittent supplementation  Anxiety = has distrust to care receiving. Has been challenging in negotiating which studies, blood work, dressing changes he is willing to do.continue to reassure patient. I don't think he would be open to psych assessment but will check with patient.  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 Plan to continue with nafcillin 2gm IV Q 4hr,  Til March 16th to complete 8 wks.  Can give keflex  thereafter if inflammatory markers still elevated  Has TEE on March 1st to see if any residual valvular disease (not seen on TTE)  [facility] Cell: [phone] Pager: [phone]  07/20/2017, 3:10 AM

## 2017-07-20 NOTE — H&P (View-Only) (Signed)
PROGRESS NOTE    Clifford Tucker   YNW:[id number]  DOB: Oct 22, 1980  DOA: 06/05/2017 PCP: Patient, No Pcp Per   Brief Narrative:  Clifford Tucker is a 37 y.o.malewith medical history significant forIV drug abuse and recurrent skin and soft tissue infections. He presented with neck/hip pain found to have a neck abscess, septic arthritis of the hip and now MSSA bacteremia. CT surgery, orthopedic surgery and infectious disease consulted.  Plan is for patient to complete his IV antibiotics on 08/09/2017.  Patient remains stable without acute issues. Spiked intermittent high fever since 2/22 without clear new source.  Chest x-ray and urine microscopy negative.  Blood cultures x2 from 2/23: Negative to date after 4 days.  Fever curve better.   Subjective: Denies complaints.  States that he did not have as much fevers last night.  No new complaints reported.  Assessment & Plan:  Bacteremia due to methicillin susceptible Staphylococcus aureus (MSSA) in the setting of IV drug use -Blood cultures 2/2+ for MSSA   -Patient was originally placed on vancomycin and Zosyn, transitioned to IV Ancef -Sternal aspirate showed MSSA, left hip wound cultures also MSSA -2D echo with no vegetation.  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  Plan to continue with nafcillin 2 g IV every 4 hourly until March 16 to complete 8 weeks.  Can give Keflex thereafter if inflammatory markers still elevated. -Patient spiking intermittent fevers since 2/22.  Unclear source.  Chest x-ray & urine microscopy negative.  Blood cultures drawn  2/23-Negative to date.  Influenza panel PCR negative.  CT chest without contrast unremarkable.  Ancef changed to nafcillin 2/24. 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  ID felt that fevers may have been due to drug fever from cefazolin and hence was changed to nafcillin.  Still reported subjective fevers and hence ID recommended TEE to rule out vegetations. -I contacted [facility] card master on 07/19/17 and was advised that the TEE would likely be for 07/21/17.  Based on TEE findings, ID may decide to extend length of therapy.   Left hip abscess/iliopsoas septic arthritis, infectious myositis s/p I&D 06/06/17 -Status post left hip I&D with cultures done on 1/15, showing staph aureus -Status post drain placement 1/18 for iliacus abscess which was removed on 06/14/17 -Doppler ultrasound of the left lower extremity negative for DVT - he stated he had been having persistent pain and tenderness in the left hip.  Orthopedics reevaluated. - MRI showed fluid collection around the hip - 2/7- The patient was taken to the OR for another I and D and wound VAC removed. -Cultures taken on 06/29/2017 negative, final report. -Left hip surgical site wound has healed well without acute findings.  Pain in left hip/opioid use disorder- - judicious use of opioids.  Some conflict early on related to patient wanting increased dose of opioids.   - Last evaluated by Dr. Oswaldo Done on 2/12 who planned to re-assess him re transitioning to Suboxone.  Septic arthritis of the sternoclavicular joint, abscess  -June 15, 2017 :status post irrigation of the right  sternoclavicular wound, with a wound VAC placement which has been removed -CT surgery follow-up 2/20 appreciated.  Improving.  No acute findings on examination of sternoclavicular joint.  Repeat CT chest without contrast 2/24 shows no recurrent abscess or abnormal  findings.  Multifocal pneumonia -Possible septic emboli to the lungs- resolved.  CT chest 2/24 shows resolution.   IV drug user -Per patient, has been using heroin- [facility] but relapsed again in a month before the admission -Last heroin use a week ago prior to admission- no withdrawal noted in the hospital -polysubstance abuse counseling  Nicotine abuse -Nicotine patch -Tobacco cessation counseling-apparently had been smoking repeatedly in the bathroom.  Hypokalemia Persistent hypokalemia.  Unclear etiology.?  Renal wasting.  Replaced aggressively. Magnesium 1.8.   Potassium finally 3.6 today.  Follow BMP periodically.  Leukopenia -Possibly due to Ancef, discontinued 2/24.  Resolved.  Anemia - Likely chronic disease. Stable.   DVT prophylaxis: sq Lovenox Code Status: Full code Disposition Plan: will stay at [facility] until IV antibiotic course completed if patient agrees.  Consultants:   CT surgery, ID, ortho, IR  Procedures:     Irrigation of chest wall with wound vac change 06/22/17.    Left hip I&D 06/06/17  Left hip I&D on 06/29/2017.  Antimicrobials:  Anti-infectives (From admission, onward)   Start     Dose/Rate Route Frequency Ordered Stop   07/15/17 1600  nafcillin 2 g in sodium chloride 0.9 % 100 mL IVPB     2 g 200 mL/hr over 30 Minutes Intravenous Every 4 hours 07/15/17 1458 08/09/17 2359   06/29/17 1517  gentamicin (GARAMYCIN) injection  Status:  Discontinued       As needed 06/29/17 1518 06/29/17 1714   06/29/17 1512  vancomycin (VANCOCIN) powder  Status:  Discontinued       As needed 06/29/17 1515 06/29/17 1714   06/29/17 1450  vancomycin (VANCOCIN) 1,000 mg in sodium chloride 0.9 % 1,000 mL irrigation  Status:  Discontinued       As needed 06/29/17 1451 06/29/17 1714   06/29/17 1345  vancomycin (VANCOCIN) 1,000 mg in sodium chloride 0.9 % 1,000 mL irrigation      Irrigation To Surgery 06/29/17 1330 06/30/17 1345    06/15/17 0816  vancomycin (VANCOCIN) powder  Status:  Discontinued       As needed 06/15/17 0817 06/15/17 0854   06/07/17 1400  cefUROXime (ZINACEF) 1.5 g in dextrose 5 % 50 mL IVPB     1.5 g 100 mL/hr over 30 Minutes Intravenous To [facility] 06/07/17 0125 06/07/17 1527   06/07/17 0745  vancomycin (VANCOCIN) 1,000 mg in sodium chloride 0.9 % 1,000 mL irrigation      Irrigation To Surgery 06/07/17 0744 06/07/17 1513   06/06/17 1639  gentamicin (GARAMYCIN) injection  Status:  Discontinued       As needed 06/06/17 1640 06/06/17 1715   06/06/17 1638  vancomycin (VANCOCIN) powder  Status:  Discontinued       As needed 06/06/17 1639 06/06/17 1715   06/06/17 1000  ceFAZolin (ANCEF) IVPB 2g/100 mL premix  Status:  Discontinued     2 g 200 mL/hr over 30 Minutes Intravenous Every 8 hours 06/06/17 0911 07/15/17 1457   06/06/17 0000  vancomycin (VANCOCIN) IVPB 1000 mg/200 mL premix  Status:  Discontinued     1,000 mg 200 mL/hr over 60 Minutes Intravenous Every 8 hours 06/05/17 2211 06/06/17 0911   06/05/17 2330  piperacillin-tazobactam (ZOSYN) IVPB 3.375  g  Status:  Discontinued     3.375 g 12.5 mL/hr over 240 Minutes Intravenous Every 8 hours 06/05/17 2211 06/06/17 0911   06/05/17 2200  cefTRIAXone (ROCEPHIN) 1 g in dextrose 5 % 50 mL IVPB  Status:  Discontinued     1 g 100 mL/hr over 30 Minutes Intravenous Every 24 hours 06/05/17 2140 06/05/17 2202   06/05/17 2200  azithromycin (ZITHROMAX) 500 mg in dextrose 5 % 250 mL IVPB  Status:  Discontinued     500 mg 250 mL/hr over 60 Minutes Intravenous Every 24 hours 06/05/17 2140 06/06/17 0911   06/05/17 1600  vancomycin (VANCOCIN) 1,500 mg in sodium chloride 0.9 % 500 mL IVPB     1,500 mg 250 mL/hr over 120 Minutes Intravenous  Once 06/05/17 1542 06/05/17 1855   06/05/17 1515  piperacillin-tazobactam (ZOSYN) IVPB 3.375 g     3.375 g 100 mL/hr over 30 Minutes Intravenous  Once 06/05/17 1512 06/05/17 1557       Objective: Vitals:    07/19/17 0548 07/19/17 1458 07/19/17 2135 07/20/17 1405  BP: 108/62 (!) 92/40 103/60 (!) 102/58  Pulse: 82 75 80 72  Resp: 18 17 18 18   Temp: 98.9 F (37.2 C) 99.2 F (37.3 C) 98.9 F (37.2 C) 98.8 F (37.1 C)  TempSrc: Oral Oral Oral Oral  SpO2: 98% 99% 97% 99%  Weight:      Height:        Intake/Output Summary (Last 24 hours) at 07/20/2017 1626 Last data filed at 07/20/2017 1045 Gross per 24 hour  Intake 221 ml  Output -  Net 221 ml   Filed Weights   06/05/17 1407 06/06/17 0844 06/29/17 0557  Weight: 68 kg (150 lb) 70.3 kg (154 lb 15.7 oz) 70.4 kg (155 lb 3.3 oz)    Examination: No significant change in exam over the last several days. General exam: Pleasant young male, moderately built and nourished, lying comfortably in bed.  Does not appear in any distress. Respiratory system: Clear to auscultation.  No increased work of breathing. Cardiovascular system: S1 and S2 heard, RRR.  No JVD, murmurs or pedal edema.  Stable without change. Gastrointestinal system: Abdomen soft, non-tender, nondistended. Normal bowel sound. No organomegaly.  Stable without change. Central nervous system: Alert and oriented. No focal neurological deficits. Left hip: Left hip surgical site has healed without acute findings.   Skin: Sternoclavicular area wound without acute findings and is packed. Extremities: Right upper arm PICC line without acute findings.   Data Reviewed: I have personally reviewed following labs and imaging studies  CBC: Recent Labs  Lab 07/16/17 0346 07/17/17 0349  WBC 3.5* 5.2  NEUTROABS 2.3  --   HGB 10.2* 9.8*  HCT 31.0* 30.2*  MCV 87.1 88.3  PLT 157 154   Basic Metabolic Panel: Recent Labs  Lab 07/16/17 0346 07/17/17 0349 07/18/17 0426 07/19/17 0809  NA 131* 134* 135 131*  K 3.3* 3.2* 2.7* 3.6  CL 99* 102 102 100*  CO2 25 21* 23 23  GLUCOSE 141* 166* 150* 95  BUN 5* <5* <5* 5*  CREATININE 0.56* 0.58* 0.57* 0.68  CALCIUM 8.0* 8.1* 8.2* 8.2*  MG  --    --  1.8  --    GFR: Estimated Creatinine Clearance: 128.3 mL/min (by C-G formula based on SCr of 0.68 mg/dL).       Radiology Studies: No results found.    Scheduled Meds: . enoxaparin (LOVENOX) injection  40 mg Subcutaneous Q24H  . gabapentin  300 mg Oral TID  . zinc sulfate  220 mg Oral Daily   Continuous Infusions: . nafcillin IV Stopped (07/20/17 1243)     LOS: 45 days    Time spent in minutes: 25 minutes.  Marcellus Scott, MD, FACP, [facility]. [facility] Pager [phone]  If 7PM-7AM, please contact night-coverage [email or web address] Password [facility] 07/20/2017, 4:26 PM

## 2017-07-20 NOTE — Progress Notes (Signed)
    [facility] has been requested to perform a transesophageal echocardiogram on 07/21/17 for bacteremia.  After careful review of history and examination, the risks and benefits of transesophageal echocardiogram have been explained including risks of esophageal damage, perforation (1:10,000 risk), bleeding, pharyngeal hematoma as well as other potential complications associated with conscious sedation including aspiration, arrhythmia, respiratory failure and death. Alternatives to treatment were discussed, questions were answered. Patient is willing to proceed. Labs and vital signs reviewed. Will need to closely follow blood pressures as they have been soft this admission.  Laverda PageLindsay Kleigh Hoelzer, NP-C 07/20/2017 5:03 PM

## 2017-07-20 NOTE — Progress Notes (Signed)
PROGRESS NOTE    Clifford Tucker   YNW:[id number]  DOB: Oct 22, 1980  DOA: 06/05/2017 PCP: Patient, No Pcp Per   Brief Narrative:  Clifford Tucker is a 37 y.o.malewith medical history significant forIV drug abuse and recurrent skin and soft tissue infections. He presented with neck/hip pain found to have a neck abscess, septic arthritis of the hip and now MSSA bacteremia. CT surgery, orthopedic surgery and infectious disease consulted.  Plan is for patient to complete his IV antibiotics on 08/09/2017.  Patient remains stable without acute issues. Spiked intermittent high fever since 2/22 without clear new source.  Chest x-ray and urine microscopy negative.  Blood cultures x2 from 2/23: Negative to date after 4 days.  Fever curve better.   Subjective: Denies complaints.  States that he did not have as much fevers last night.  No new complaints reported.  Assessment & Plan:  Bacteremia due to methicillin susceptible Staphylococcus aureus (MSSA) in the setting of IV drug use -Blood cultures 2/2+ for MSSA   -Patient was originally placed on vancomycin and Zosyn, transitioned to IV Ancef -Sternal aspirate showed MSSA, left hip wound cultures also MSSA -2D echo with no vegetation.  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  Plan to continue with nafcillin 2 g IV every 4 hourly until March 16 to complete 8 weeks.  Can give Keflex thereafter if inflammatory markers still elevated. -Patient spiking intermittent fevers since 2/22.  Unclear source.  Chest x-ray & urine microscopy negative.  Blood cultures drawn  2/23-Negative to date.  Influenza panel PCR negative.  CT chest without contrast unremarkable.  Ancef changed to nafcillin 2/24. 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  ID felt that fevers may have been due to drug fever from cefazolin and hence was changed to nafcillin.  Still reported subjective fevers and hence ID recommended TEE to rule out vegetations. -I contacted [facility] card master on 07/19/17 and was advised that the TEE would likely be for 07/21/17.  Based on TEE findings, ID may decide to extend length of therapy.   Left hip abscess/iliopsoas septic arthritis, infectious myositis s/p I&D 06/06/17 -Status post left hip I&D with cultures done on 1/15, showing staph aureus -Status post drain placement 1/18 for iliacus abscess which was removed on 06/14/17 -Doppler ultrasound of the left lower extremity negative for DVT - he stated he had been having persistent pain and tenderness in the left hip.  Orthopedics reevaluated. - MRI showed fluid collection around the hip - 2/7- The patient was taken to the OR for another I and D and wound VAC removed. -Cultures taken on 06/29/2017 negative, final report. -Left hip surgical site wound has healed well without acute findings.  Pain in left hip/opioid use disorder- - judicious use of opioids.  Some conflict early on related to patient wanting increased dose of opioids.   - Last evaluated by Dr. Oswaldo Done on 2/12 who planned to re-assess him re transitioning to Suboxone.  Septic arthritis of the sternoclavicular joint, abscess  -June 15, 2017 :status post irrigation of the right  sternoclavicular wound, with a wound VAC placement which has been removed -CT surgery follow-up 2/20 appreciated.  Improving.  No acute findings on examination of sternoclavicular joint.  Repeat CT chest without contrast 2/24 shows no recurrent abscess or abnormal  findings.  Multifocal pneumonia -Possible septic emboli to the lungs- resolved.  CT chest 2/24 shows resolution.   IV drug user -Per patient, has been using heroin- [facility] but relapsed again in a month before the admission -Last heroin use a week ago prior to admission- no withdrawal noted in the hospital -polysubstance abuse counseling  Nicotine abuse -Nicotine patch -Tobacco cessation counseling-apparently had been smoking repeatedly in the bathroom.  Hypokalemia Persistent hypokalemia.  Unclear etiology.?  Renal wasting.  Replaced aggressively. Magnesium 1.8.   Potassium finally 3.6 today.  Follow BMP periodically.  Leukopenia -Possibly due to Ancef, discontinued 2/24.  Resolved.  Anemia - Likely chronic disease. Stable.   DVT prophylaxis: sq Lovenox Code Status: Full code Disposition Plan: will stay at [facility] until IV antibiotic course completed if patient agrees.  Consultants:   CT surgery, ID, ortho, IR  Procedures:     Irrigation of chest wall with wound vac change 06/22/17.    Left hip I&D 06/06/17  Left hip I&D on 06/29/2017.  Antimicrobials:  Anti-infectives (From admission, onward)   Start     Dose/Rate Route Frequency Ordered Stop   07/15/17 1600  nafcillin 2 g in sodium chloride 0.9 % 100 mL IVPB     2 g 200 mL/hr over 30 Minutes Intravenous Every 4 hours 07/15/17 1458 08/09/17 2359   06/29/17 1517  gentamicin (GARAMYCIN) injection  Status:  Discontinued       As needed 06/29/17 1518 06/29/17 1714   06/29/17 1512  vancomycin (VANCOCIN) powder  Status:  Discontinued       As needed 06/29/17 1515 06/29/17 1714   06/29/17 1450  vancomycin (VANCOCIN) 1,000 mg in sodium chloride 0.9 % 1,000 mL irrigation  Status:  Discontinued       As needed 06/29/17 1451 06/29/17 1714   06/29/17 1345  vancomycin (VANCOCIN) 1,000 mg in sodium chloride 0.9 % 1,000 mL irrigation      Irrigation To Surgery 06/29/17 1330 06/30/17 1345    06/15/17 0816  vancomycin (VANCOCIN) powder  Status:  Discontinued       As needed 06/15/17 0817 06/15/17 0854   06/07/17 1400  cefUROXime (ZINACEF) 1.5 g in dextrose 5 % 50 mL IVPB     1.5 g 100 mL/hr over 30 Minutes Intravenous To ShortStay Surgical 06/07/17 0125 06/07/17 1527   06/07/17 0745  vancomycin (VANCOCIN) 1,000 mg in sodium chloride 0.9 % 1,000 mL irrigation      Irrigation To Surgery 06/07/17 0744 06/07/17 1513   06/06/17 1639  gentamicin (GARAMYCIN) injection  Status:  Discontinued       As needed 06/06/17 1640 06/06/17 1715   06/06/17 1638  vancomycin (VANCOCIN) powder  Status:  Discontinued       As needed 06/06/17 1639 06/06/17 1715   06/06/17 1000  ceFAZolin (ANCEF) IVPB 2g/100 mL premix  Status:  Discontinued     2 g 200 mL/hr over 30 Minutes Intravenous Every 8 hours 06/06/17 0911 07/15/17 1457   06/06/17 0000  vancomycin (VANCOCIN) IVPB 1000 mg/200 mL premix  Status:  Discontinued     1,000 mg 200 mL/hr over 60 Minutes Intravenous Every 8 hours 06/05/17 2211 06/06/17 0911   06/05/17 2330  piperacillin-tazobactam (ZOSYN) IVPB 3.375  g  Status:  Discontinued     3.375 g 12.5 mL/hr over 240 Minutes Intravenous Every 8 hours 06/05/17 2211 06/06/17 0911   06/05/17 2200  cefTRIAXone (ROCEPHIN) 1 g in dextrose 5 % 50 mL IVPB  Status:  Discontinued     1 g 100 mL/hr over 30 Minutes Intravenous Every 24 hours 06/05/17 2140 06/05/17 2202   06/05/17 2200  azithromycin (ZITHROMAX) 500 mg in dextrose 5 % 250 mL IVPB  Status:  Discontinued     500 mg 250 mL/hr over 60 Minutes Intravenous Every 24 hours 06/05/17 2140 06/06/17 0911   06/05/17 1600  vancomycin (VANCOCIN) 1,500 mg in sodium chloride 0.9 % 500 mL IVPB     1,500 mg 250 mL/hr over 120 Minutes Intravenous  Once 06/05/17 1542 06/05/17 1855   06/05/17 1515  piperacillin-tazobactam (ZOSYN) IVPB 3.375 g     3.375 g 100 mL/hr over 30 Minutes Intravenous  Once 06/05/17 1512 06/05/17 1557       Objective: Vitals:    07/19/17 0548 07/19/17 1458 07/19/17 2135 07/20/17 1405  BP: 108/62 (!) 92/40 103/60 (!) 102/58  Pulse: 82 75 80 72  Resp: 18 17 18 18   Temp: 98.9 F (37.2 C) 99.2 F (37.3 C) 98.9 F (37.2 C) 98.8 F (37.1 C)  TempSrc: Oral Oral Oral Oral  SpO2: 98% 99% 97% 99%  Weight:      Height:        Intake/Output Summary (Last 24 hours) at 07/20/2017 1626 Last data filed at 07/20/2017 1045 Gross per 24 hour  Intake 221 ml  Output -  Net 221 ml   Filed Weights   06/05/17 1407 06/06/17 0844 06/29/17 0557  Weight: 68 kg (150 lb) 70.3 kg (154 lb 15.7 oz) 70.4 kg (155 lb 3.3 oz)    Examination: No significant change in exam over the last several days. General exam: Pleasant young male, moderately built and nourished, lying comfortably in bed.  Does not appear in any distress. Respiratory system: Clear to auscultation.  No increased work of breathing. Cardiovascular system: S1 and S2 heard, RRR.  No JVD, murmurs or pedal edema.  Stable without change. Gastrointestinal system: Abdomen soft, non-tender, nondistended. Normal bowel sound. No organomegaly.  Stable without change. Central nervous system: Alert and oriented. No focal neurological deficits. Left hip: Left hip surgical site has healed without acute findings.   Skin: Sternoclavicular area wound without acute findings and is packed. Extremities: Right upper arm PICC line without acute findings.   Data Reviewed: I have personally reviewed following labs and imaging studies  CBC: Recent Labs  Lab 07/16/17 0346 07/17/17 0349  WBC 3.5* 5.2  NEUTROABS 2.3  --   HGB 10.2* 9.8*  HCT 31.0* 30.2*  MCV 87.1 88.3  PLT 157 154   Basic Metabolic Panel: Recent Labs  Lab 07/16/17 0346 07/17/17 0349 07/18/17 0426 07/19/17 0809  NA 131* 134* 135 131*  K 3.3* 3.2* 2.7* 3.6  CL 99* 102 102 100*  CO2 25 21* 23 23  GLUCOSE 141* 166* 150* 95  BUN 5* <5* <5* 5*  CREATININE 0.56* 0.58* 0.57* 0.68  CALCIUM 8.0* 8.1* 8.2* 8.2*  MG  --    --  1.8  --    GFR: Estimated Creatinine Clearance: 128.3 mL/min (by C-G formula based on SCr of 0.68 mg/dL).       Radiology Studies: No results found.    Scheduled Meds: . enoxaparin (LOVENOX) injection  40 mg Subcutaneous Q24H  . gabapentin  300 mg Oral TID  . zinc sulfate  220 mg Oral Daily   Continuous Infusions: . nafcillin IV Stopped (07/20/17 1243)     LOS: 45 days    Time spent in minutes: 25 minutes.  Marcellus Scott, MD, FACP, [facility]. [facility] Pager [phone]  If 7PM-7AM, please contact night-coverage [email or web address] Password [facility] 07/20/2017, 4:26 PM

## 2017-07-21 ENCOUNTER — Inpatient Hospital Stay (HOSPITAL_COMMUNITY): Payer: Self-pay | Admitting: Certified Registered"

## 2017-07-21 ENCOUNTER — Encounter (HOSPITAL_COMMUNITY)
Admission: EM | Disposition: A | Discharge status: Home / Self Care | Payer: Self-pay | Source: Home / Self Care | Attending: Internal Medicine

## 2017-07-21 ENCOUNTER — Encounter (HOSPITAL_COMMUNITY): Payer: Self-pay | Admitting: Certified Registered"

## 2017-07-21 ENCOUNTER — Inpatient Hospital Stay (HOSPITAL_COMMUNITY): Payer: Self-pay

## 2017-07-21 DIAGNOSIS — R7881 Bacteremia: Secondary | ICD-10-CM

## 2017-07-21 HISTORY — PX: TEE WITHOUT CARDIOVERSION: SHX5443

## 2017-07-21 LAB — CBC
HCT: 32.7 % — ABNORMAL LOW (ref 39.0–52.0)
Hemoglobin: 10.7 g/dL — ABNORMAL LOW (ref 13.0–17.0)
MCH: 28.8 pg (ref 26.0–34.0)
MCHC: 32.7 g/dL (ref 30.0–36.0)
MCV: 87.9 fL (ref 78.0–100.0)
PLATELETS: 286 10*3/uL (ref 150–400)
RBC: 3.72 MIL/uL — ABNORMAL LOW (ref 4.22–5.81)
RDW: 14.8 % (ref 11.5–15.5)
WBC: 8.7 10*3/uL (ref 4.0–10.5)

## 2017-07-21 LAB — BASIC METABOLIC PANEL
Anion gap: 14 (ref 5–15)
BUN: <5 mg/dL — ABNORMAL LOW (ref 6–20)
CALCIUM: 8.7 mg/dL — AB (ref 8.9–10.3)
CO2: 23 mmol/L (ref 22–32)
CREATININE: 0.64 mg/dL (ref 0.61–1.24)
Chloride: 101 mmol/L (ref 101–111)
GLUCOSE: 112 mg/dL — AB (ref 65–99)
Potassium: 3.1 mmol/L — ABNORMAL LOW (ref 3.5–5.1)
Sodium: 138 mmol/L (ref 135–145)

## 2017-07-21 LAB — CULTURE, BLOOD (ROUTINE X 2)
Culture: NO GROWTH
SPECIAL REQUESTS: ADEQUATE

## 2017-07-21 SURGERY — ECHOCARDIOGRAM, TRANSESOPHAGEAL
Anesthesia: Monitor Anesthesia Care

## 2017-07-21 MED ORDER — SODIUM CHLORIDE 0.9 % IV SOLN: INTRAVENOUS | Status: DC

## 2017-07-21 MED ORDER — PROPOFOL 10 MG/ML IV BOLUS
INTRAVENOUS | Status: DC | PRN
  Administered 2017-07-21: 50 mg via INTRAVENOUS

## 2017-07-21 MED ORDER — LIDOCAINE 2% (20 MG/ML) 5 ML SYRINGE
INTRAMUSCULAR | Status: DC | PRN
  Administered 2017-07-21: 80 mg via INTRAVENOUS

## 2017-07-21 MED ORDER — POTASSIUM CHLORIDE CRYS ER 20 MEQ PO TBCR
40.0000 meq | EXTENDED_RELEASE_TABLET | Freq: Every day | ORAL | Status: DC
  Filled 2017-07-21: qty 2

## 2017-07-21 MED ORDER — PROPOFOL 500 MG/50ML IV EMUL
INTRAVENOUS | Status: DC | PRN
  Administered 2017-07-21: 150 ug/kg/min via INTRAVENOUS

## 2017-07-21 MED ORDER — BUTAMBEN-TETRACAINE-BENZOCAINE 2-2-14 % EX AERO
INHALATION_SPRAY | CUTANEOUS | Status: DC | PRN
  Administered 2017-07-21: 2 via TOPICAL

## 2017-07-21 MED ORDER — KETAMINE INJECTION FOR OPTIME (MG/KG/HR) DOCUMENTATION
INTRAVENOUS | Status: DC | PRN
  Administered 2017-07-21: 20 mg via INTRAVENOUS
  Administered 2017-07-21: 10 mg via INTRAVENOUS

## 2017-07-21 MED ORDER — LACTATED RINGERS IV SOLN
INTRAVENOUS | Status: DC | PRN
  Administered 2017-07-21: 13:00:00 via INTRAVENOUS

## 2017-07-21 MED ORDER — POTASSIUM CHLORIDE CRYS ER 20 MEQ PO TBCR
40.0000 meq | EXTENDED_RELEASE_TABLET | Freq: Two times a day (BID) | ORAL | Status: DC
  Administered 2017-07-21 – 2017-07-23 (×4): 40 meq via ORAL
  Filled 2017-07-21 (×4): qty 2

## 2017-07-21 MED ORDER — KETAMINE HCL-SODIUM CHLORIDE 100-0.9 MG/10ML-% IV SOSY
PREFILLED_SYRINGE | INTRAVENOUS | Status: AC
  Filled 2017-07-21: qty 10

## 2017-07-21 NOTE — Progress Notes (Signed)
PROGRESS NOTE    Clifford Tucker   ZOX:[id number]  DOB: December 15, 1980  DOA: 06/05/2017 PCP: Patient, No Pcp Per   Brief Narrative:  37 y.o.MIV drug abuse and recurrent skin and soft tissue infections. He presented with neck/hip pain found to have a neck abscess, septic arthritis of the hip and now MSSA bacteremia. CT surgery, orthopedic surgery and infectious disease consulted.  Plan is for patient to complete his IV antibiotics on 08/09/2017.  Patient remains stable without acute issues. Spiked intermittent high fever since 2/22 without clear new source.  Chest x-ray and urine microscopy negative.  Blood cultures x2 from 2/23: Negative to date after 4 days.  Fever curve better.   Subjective:   Assessment & Plan:  Bacteremia due to methicillin susceptible Staphylococcus aureus (MSSA) in the setting of IV drug use -Blood cultures 2/2+ for MSSA   -Patient was originally placed on vancomycin and Zosyn, transitioned to IV Ancef -Sternal aspirate showed MSSA, left hip wound cultures also MSSA -2D echo with no vegetation.  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  Plan to continue with nafcillin 2 g IV every 4 hourly until March 16 to complete 8 weeks.  Can give Keflex thereafter if inflammatory markers still elevated. -Patient spiking intermittent fevers since 2/22.  Unclear source.  Chest x-ray & urine microscopy negative.  Blood cultures drawn 2/23-Negative to date.  Influenza panel PCR negative.  CT chest without contrast unremarkable.  Ancef changed to nafcillin 2/24. 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  ID felt that fevers may have been due to drug fever from cefazolin and hence was changed to nafcillin.  Still reported subjective fevers and hence ID recommended TEE to rule out vegetations. -TEE neg on 07/21/17 for Vegetations.  No further work up planned and fever curve is neg   Left hip abscess/iliopsoas septic arthritis, infectious myositis s/p I&D 06/06/17 -Status post left hip I&D with cultures done on 1/15, showing staph aureus -Status post drain placement 1/18 for iliacus abscess which was removed on 06/14/17 -Doppler ultrasound of the left lower extremity negative for DVT - he stated he had been having persistent pain and tenderness in the left hip.  Orthopedics reevaluated. - MRI showed fluid collection around the hip - 2/7- The patient was taken to the OR for another I and D and wound VAC removed. -Cultures taken on 06/29/2017 negative, final report. -Left hip surgical site wound has healed well without acute findings.  Pain in left hip/opioid use disorder- - judicious use of opioids.   - Last evaluated by Dr. Oswaldo Done on 2/12 who planned to re-assess him re transitioning to [facility] d/w Dr. Oswaldo Done on 3/4  Septic arthritis of the sternoclavicular joint, abscess  -June 15, 2017 :status post irrigation of the right sternoclavicular wound, with a wound VAC placement which has been removed -CT surgery follow-up 2/20 appreciated.  Improving.  No acute findings on examination of sternoclavicular joint.  Repeat CT chest without contrast 2/24 shows no recurrent abscess or abnormal findings.  Multifocal pneumonia -Possible septic emboli to  the lungs- resolved.  CT chest 2/24 shows resolution.   IV drug user -Per patient, has been using heroin- [facility] but relapsed again in a month before the admission -Last heroin use a week ago prior to  admission- no withdrawal noted in the hospital -polysubstance abuse counseling  Nicotine abuse -Nicotine patch -Tobacco cessation counseling-apparently had been smoking repeatedly in the bathroom.  Hypokalemia Persistent hypokalemia.  Unclear etiology.?  Renal wasting.  Replaced aggressively. Magnesium 1.8.   Potassium 3.1 , replace with Kdur 40 bid  Leukopenia -Possibly due to Ancef, discontinued 2/24.  Resolved.  Anemia - Likely chronic disease. Stable.   DVT prophylaxis: sq Lovenox Code Status: Full code Disposition Plan: will stay at [facility] until IV antibiotic course completed if patient agrees.  Consultants:   CT surgery, ID, ortho, IR  Procedures:     Irrigation of chest wall with wound vac change 06/22/17.    Left hip I&D 06/06/17  Left hip I&D on 06/29/2017.  Antimicrobials:  Anti-infectives (From admission, onward)   Start     Dose/Rate Route Frequency Ordered Stop   07/15/17 1600  nafcillin 2 g in sodium chloride 0.9 % 100 mL IVPB     2 g 200 mL/hr over 30 Minutes Intravenous Every 4 hours 07/15/17 1458 08/09/17 2359   06/29/17 1517  gentamicin (GARAMYCIN) injection  Status:  Discontinued       As needed 06/29/17 1518 06/29/17 1714   06/29/17 1512  vancomycin (VANCOCIN) powder  Status:  Discontinued       As needed 06/29/17 1515 06/29/17 1714   06/29/17 1450  vancomycin (VANCOCIN) 1,000 mg in sodium chloride 0.9 % 1,000 mL irrigation  Status:  Discontinued       As needed 06/29/17 1451 06/29/17 1714   06/29/17 1345  vancomycin (VANCOCIN) 1,000 mg in sodium chloride 0.9 % 1,000 mL irrigation      Irrigation To Surgery 06/29/17 1330 06/30/17 1345   06/15/17 0816  vancomycin (VANCOCIN) powder  Status:  Discontinued       As needed 06/15/17 0817 06/15/17 0854   06/07/17 1400  cefUROXime (ZINACEF) 1.5 g in dextrose 5 % 50 mL IVPB     1.5 g 100 mL/hr over 30 Minutes Intravenous To ShortStay Surgical 06/07/17 0125 06/07/17 1527   06/07/17 0745  vancomycin  (VANCOCIN) 1,000 mg in sodium chloride 0.9 % 1,000 mL irrigation      Irrigation To Surgery 06/07/17 0744 06/07/17 1513   06/06/17 1639  gentamicin (GARAMYCIN) injection  Status:  Discontinued       As needed 06/06/17 1640 06/06/17 1715   06/06/17 1638  vancomycin (VANCOCIN) powder  Status:  Discontinued       As needed 06/06/17 1639 06/06/17 1715   06/06/17 1000  ceFAZolin (ANCEF) IVPB 2g/100 mL premix  Status:  Discontinued     2 g 200 mL/hr over 30 Minutes Intravenous Every 8 hours 06/06/17 0911 07/15/17 1457   06/06/17 0000  vancomycin (VANCOCIN) IVPB 1000 mg/200 mL premix  Status:  Discontinued     1,000 mg 200 mL/hr over 60 Minutes Intravenous Every 8 hours 06/05/17 2211 06/06/17 0911   06/05/17 2330  piperacillin-tazobactam (ZOSYN) IVPB 3.375 g  Status:  Discontinued     3.375 g 12.5 mL/hr over 240 Minutes Intravenous Every 8 hours 06/05/17 2211 06/06/17 0911   06/05/17 2200  cefTRIAXone (ROCEPHIN) 1 g in dextrose 5 % 50 mL IVPB  Status:  Discontinued  1 g 100 mL/hr over 30 Minutes Intravenous Every 24 hours 06/05/17 2140 06/05/17 2202   06/05/17 2200  azithromycin (ZITHROMAX) 500 mg in dextrose 5 % 250 mL IVPB  Status:  Discontinued     500 mg 250 mL/hr over 60 Minutes Intravenous Every 24 hours 06/05/17 2140 06/06/17 0911   06/05/17 1600  vancomycin (VANCOCIN) 1,500 mg in sodium chloride 0.9 % 500 mL IVPB     1,500 mg 250 mL/hr over 120 Minutes Intravenous  Once 06/05/17 1542 06/05/17 1855   06/05/17 1515  piperacillin-tazobactam (ZOSYN) IVPB 3.375 g     3.375 g 100 mL/hr over 30 Minutes Intravenous  Once 06/05/17 1512 06/05/17 1557       Objective: Vitals:   07/21/17 1231 07/21/17 1338 07/21/17 1340 07/21/17 1350  BP: 102/65 102/62 102/62 109/70  Pulse: 72 76 76 69  Resp: 11 19 19 13   Temp: 97.9 F (36.6 C) 98.1 F (36.7 C)    TempSrc: Oral Oral    SpO2: 98% 99% 98% 98%  Weight:      Height:        Intake/Output Summary (Last 24 hours) at 07/21/2017 1430 Last  data filed at 07/21/2017 1339 Gross per 24 hour  Intake 3200 ml  Output 2 ml  Net 3198 ml   Filed Weights   06/05/17 1407 06/06/17 0844 06/29/17 0557  Weight: 68 kg (150 lb) 70.3 kg (154 lb 15.7 oz) 70.4 kg (155 lb 3.3 oz)    Examination:   Awake alert no distress eomi ncat  no rash s1 s 2no m cta b No le edema abd soft nt nd no rebound   Data Reviewed: I have personally reviewed following labs and imaging studies  CBC: Recent Labs  Lab 07/16/17 0346 07/17/17 0349 07/20/17 1714 07/21/17 0420  WBC 3.5* 5.2 7.6 8.7  NEUTROABS 2.3  --   --   --   HGB 10.2* 9.8* 10.5* 10.7*  HCT 31.0* 30.2* 31.8* 32.7*  MCV 87.1 88.3 87.6 87.9  PLT 157 154 233 286   Basic Metabolic Panel: Recent Labs  Lab 07/16/17 0346 07/17/17 0349 07/18/17 0426 07/19/17 0809 07/20/17 1714 07/21/17 0420  NA 131* 134* 135 131*  --  138  K 3.3* 3.2* 2.7* 3.6  --  3.1*  CL 99* 102 102 100*  --  101  CO2 25 21* 23 23  --  23  GLUCOSE 141* 166* 150* 95  --  112*  BUN 5* <5* <5* 5*  --  <5*  CREATININE 0.56* 0.58* 0.57* 0.68 0.69 0.64  CALCIUM 8.0* 8.1* 8.2* 8.2*  --  8.7*  MG  --   --  1.8  --   --   --    GFR: Estimated Creatinine Clearance: 128.3 mL/min (by C-G formula based on SCr of 0.64 mg/dL).       Radiology Studies: No results found.    Scheduled Meds: . enoxaparin (LOVENOX) injection  40 mg Subcutaneous Q24H  . gabapentin  300 mg Oral TID  . potassium chloride  40 mEq Oral Daily  . zinc sulfate  220 mg Oral Daily   Continuous Infusions: . nafcillin IV Stopped (07/21/17 0910)     LOS: 46 days    Time spent in minutes: 25 minutes.  Pleas Koch, MD [phone]   If 7PM-7AM, please contact night-coverage [email or web address] Password [email or web address] 07/21/2017, 2:30 PM

## 2017-07-21 NOTE — Anesthesia Preprocedure Evaluation (Addendum)
Anesthesia Evaluation  Patient identified by MRN, date of birth, ID band Patient awake    Reviewed: Allergy & Precautions, NPO status , Patient's Chart, lab work & pertinent test results  Airway Mallampati: II  TM Distance: >3 FB Neck ROM: Full    Dental  (+) Teeth Intact, Dental Advisory Given   Pulmonary pneumonia, Current Smoker,    breath sounds clear to auscultation       Cardiovascular  Rhythm:Regular Rate:Normal     Neuro/Psych Anxiety    GI/Hepatic (+)     substance abuse  IV drug use,   Endo/Other    Renal/GU      Musculoskeletal   Abdominal   Peds  Hematology   Anesthesia Other Findings   Reproductive/Obstetrics                            Lab Results  Component Value Date   WBC 8.7 07/21/2017   HGB 10.7 (L) 07/21/2017   HCT 32.7 (L) 07/21/2017   MCV 87.9 07/21/2017   PLT 286 07/21/2017   Lab Results  Component Value Date   WBC 8.7 07/21/2017   HGB 10.7 (L) 07/21/2017   HCT 32.7 (L) 07/21/2017   MCV 87.9 07/21/2017   PLT 286 07/21/2017     Anesthesia Physical Anesthesia Plan  ASA: III  Anesthesia Plan: MAC   Post-op Pain Management:    Induction: Intravenous  PONV Risk Score and Plan: Treatment may vary due to age or medical condition  Airway Management Planned: Mask, Natural Airway and Nasal Cannula  Additional Equipment:   Intra-op Plan:   Post-operative Plan:   Informed Consent: I have reviewed the patients History and Physical, chart, labs and discussed the procedure including the risks, benefits and alternatives for the proposed anesthesia with the patient or authorized representative who has indicated his/her understanding and acceptance.     Plan Discussed with: CRNA and Anesthesiologist  Anesthesia Plan Comments:         Anesthesia Quick Evaluation

## 2017-07-21 NOTE — Progress Notes (Signed)
PT Cancellation Note  Patient Details Name: Clifford Tucker MRN: [id number] DOB: 1980/10/05   Cancelled Treatment:    Reason Eval/Treat Not Completed: PT screened, no needs identified, will sign off Patient refusing therapy evaluation  citing "I don't need physical therapy". Per RN and mobility tech, patient has been ambulating the unit and going outside with no supervision or difficulty. If anything should change please re-order.   Clifford Tucker, PT, DPT Acute Rehab Services Pager: [phone]     Clifford GrandchildSean  Clifford Tucker 07/21/2017, 9:25 AM

## 2017-07-21 NOTE — CV Procedure (Signed)
    Transesophageal Echocardiogram Note  Clifford BoschChristopher Tucker [id number] December 18, 1980  Procedure: Transesophageal Echocardiogram Indications: Bacteremia  Procedure Details Consent: Obtained Time Out: Verified patient identification, verified procedure, site/side was marked, verified correct patient position, special equipment/implants available, Radiology Safety Procedures followed,  medications/allergies/relevent history reviewed, required imaging and test results available.  Performed  Medications:  Pt sedateded by anesthesia with ketamine 30 mg and diprovan 170 mg IV.  Normal LV function; no vegetations   Complications: No apparent complications Patient did tolerate procedure well.  Clifford MillersBrian Crenshaw, MD

## 2017-07-21 NOTE — Progress Notes (Signed)
Patient refused dressing change this am.

## 2017-07-21 NOTE — Anesthesia Postprocedure Evaluation (Signed)
Anesthesia Post Note  Patient: Clifford Tucker  Procedure(s) Performed: TRANSESOPHAGEAL ECHOCARDIOGRAM (TEE) (N/A )     Patient location during evaluation: Endoscopy Anesthesia Type: MAC Level of consciousness: awake and alert Pain management: pain level controlled Vital Signs Assessment: post-procedure vital signs reviewed and stable Respiratory status: spontaneous breathing, nonlabored ventilation, respiratory function stable and patient connected to nasal cannula oxygen Cardiovascular status: blood pressure returned to baseline and stable Postop Assessment: no apparent nausea or vomiting Anesthetic complications: no    Last Vitals:  Vitals:   07/21/17 1340 07/21/17 1350  BP: 102/62 109/70  Pulse: 76 69  Resp: 19 13  Temp:    SpO2: 98% 98%    Last Pain:  Vitals:   07/21/17 1338  TempSrc: Oral  PainSc:                  [name]

## 2017-07-21 NOTE — Interval H&P Note (Signed)
History and Physical Interval Note:  07/21/2017 1:07 PM  Clifford Tucker  has presented today for surgery, with the diagnosis of bacteremia  The various methods of treatment have been discussed with the patient and family. After consideration of risks, benefits and other options for treatment, the patient has consented to  Procedure(s): TRANSESOPHAGEAL ECHOCARDIOGRAM (TEE) (N/A) as a surgical intervention .  The patient's history has been reviewed, patient examined, no change in status, stable for surgery.  I have reviewed the patient's chart and labs.  Questions were answered to the patient's satisfaction.     Olga MillersBrian Sirron Francesconi

## 2017-07-21 NOTE — Progress Notes (Signed)
  Echocardiogram Echocardiogram Transesophageal has been performed.  Clifford Tucker Clifford Tucker 07/21/2017, 2:01 PM

## 2017-07-21 NOTE — Transfer of Care (Signed)
Immediate Anesthesia Transfer of Care Note  Patient: Clifford Tucker  Procedure(s) Performed: TRANSESOPHAGEAL ECHOCARDIOGRAM (TEE) (N/A )  Patient Location: PACU and TEE recovery  Anesthesia Type:MAC  Level of Consciousness: awake  Airway & Oxygen Therapy: Patient Spontanous Breathing and Patient connected to nasal cannula oxygen  Post-op Assessment: Report given to RN and Post -op Vital signs reviewed and stable  Post vital signs: Reviewed and stable  Last Vitals:  Vitals:   07/21/17 0557 07/21/17 1231  BP: (!) 103/57 102/65  Pulse: 79 72  Resp: 18 11  Temp: 36.8 C 36.6 C  SpO2: 99% 98%    Last Pain:  Vitals:   07/21/17 1231  TempSrc: Oral  PainSc:       Patients Stated Pain Goal: 3 (48/27/07 8675)  Complications: No apparent anesthesia complications

## 2017-07-22 LAB — BASIC METABOLIC PANEL
Anion gap: 11 (ref 5–15)
CO2: 23 mmol/L (ref 22–32)
Calcium: 8.7 mg/dL — ABNORMAL LOW (ref 8.9–10.3)
Chloride: 105 mmol/L (ref 101–111)
Creatinine, Ser: 0.57 mg/dL — ABNORMAL LOW (ref 0.61–1.24)
GFR calc Af Amer: >60 mL/min (ref >60)
GFR calc non Af Amer: >60 mL/min (ref >60)
GLUCOSE: 141 mg/dL — AB (ref 65–99)
POTASSIUM: 3.1 mmol/L — AB (ref 3.5–5.1)
Sodium: 139 mmol/L (ref 135–145)

## 2017-07-22 LAB — MAGNESIUM: Magnesium: 2.1 mg/dL (ref 1.7–2.4)

## 2017-07-22 NOTE — Progress Notes (Signed)
No charge note. °

## 2017-07-23 MED ORDER — POTASSIUM CHLORIDE CRYS ER 20 MEQ PO TBCR
40.0000 meq | EXTENDED_RELEASE_TABLET | Freq: Three times a day (TID) | ORAL | Status: DC
  Administered 2017-07-23 – 2017-07-29 (×13): 40 meq via ORAL
  Filled 2017-07-23 (×19): qty 2

## 2017-07-23 NOTE — Progress Notes (Signed)
PROGRESS NOTE    Clifford Tucker   ZOX:[id number]  DOB: 1980/09/02  DOA: 06/05/2017 PCP: Patient, No Pcp Per   Brief Narrative:  37 y.o.MIV drug abuse and recurrent skin and soft tissue infections. He presented with neck/hip pain found to have a neck abscess, septic arthritis of the hip and now MSSA bacteremia. CT surgery, orthopedic surgery and infectious disease consulted.  Plan is for patient to complete his IV antibiotics on 08/09/2017.  Patient remains stable without acute issues. Spiked intermittent high fever since 2/22 without clear new source.  Chest x-ray and urine microscopy negative.  Blood cultures x2 from 2/23: Negative to date after 4 days.  Fever curve better.   Subjective:   Assessment & Plan:  Bacteremia due to methicillin susceptible Staphylococcus aureus (MSSA) in the setting of IV drug use -Blood cultures 2/2+ for MSSA   -Patient was originally placed on vancomycin and Zosyn, transitioned to IV Ancef -Sternal aspirate showed MSSA, left hip wound cultures also MSSA -2D echo with no vegetation.  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  Can give Keflex thereafter if inflammatory markers still elevated. -Patient spiking intermittent fevers since 2/22.  Unclear source.  Chest x-ray & urine microscopy negative.  Blood cultures drawn 2/23-Negative to date.  Influenza panel PCR negative.  CT chest without contrast unremarkable.  Ancef changed to nafcillin 2/24. 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  ID felt that fevers may have been due to drug fever from cefazolin and hence was changed to nafcillin.  Still reported subjective fevers and hence ID recommended TEE to rule out vegetations. -TEE neg on 07/21/17 for Vegetations.  No further work up planned and fever curve is neg and has been stable   Left hip abscess/iliopsoas septic arthritis, infectious myositis s/p I&D 06/06/17 -Status post left hip I&D with cultures done on 1/15, showing staph aureus -Status post drain placement 1/18 for iliacus abscess which was removed on 06/14/17 -Doppler ultrasound of the left lower extremity negative for DVT - he stated he had been having persistent pain and tenderness in the left hip.  Orthopedics reevaluated. - MRI showed fluid collection around the hip - 2/7- The patient was taken to the OR for another I and D and wound VAC removed. -Cultures taken on 06/29/2017 negative, final report. -Left hip surgical site wound has healed well without acute findings.  Pain in left hip/opioid use disorder- - judicious use of opioids.    Septic arthritis of the sternoclavicular joint, abscess  -June 15, 2017, status post irrigation of the right sternoclavicular wound, with a wound VAC placement which has been removed -CT surgery follow-up 2/20 appreciated.  Improving.  No acute findings on examination of sternoclavicular joint.  Repeat CT chest without contrast 2/24 shows no recurrent abscess or abnormal findings.  Multifocal pneumonia -Possible septic emboli to the lungs- resolved.  CT chest 2/24 shows resolution.   IV drug user -Per patient, has been using heroin- [facility]apsed [facility] in a  month before the admission -Last heroin use a week ago prior to admission- no withdrawal noted in the hospital -polysubstance abuse counseling - Last evaluated by Dr. Oswaldo Done on 2/12 who planned to re-assess him re transitioning to Suboxone-will  d/w Dr. Oswaldo Done on 3/4  Nicotine abuse -Nicotine patch -Tobacco cessation counseling-apparently had been smoking repeatedly in the bathroom.  Hypokalemia Persistent hypokalemia.  Unclear etiology.?  Renal wasting.  Replaced aggressively. Magnesium 1.8.   Potassium 3.1 , replace with Kdur 40 bid  Leukopenia -Possibly due to Ancef, discontinued 2/24.  Resolved.  Anemia - Likely chronic disease. Stable.   DVT prophylaxis: sq Lovenox Code Status: Full code Disposition Plan: will stay at [facility] until IV antibiotic course completed if patient agrees.  Consultants:   CT surgery, ID, ortho, IR  Procedures:     Irrigation of chest wall with wound vac change 06/22/17.    Left hip I&D 06/06/17  Left hip I&D on 06/29/2017.  Antimicrobials:  Anti-infectives (From admission, onward)   Start     Dose/Rate Route Frequency Ordered Stop   07/15/17 1600  nafcillin 2 g in sodium chloride 0.9 % 100 mL IVPB     2 g 200 mL/hr over 30 Minutes Intravenous Every 4 hours 07/15/17 1458 08/09/17 2359   06/29/17 1517  gentamicin (GARAMYCIN) injection  Status:  Discontinued       As needed 06/29/17 1518 06/29/17 1714   06/29/17 1512  vancomycin (VANCOCIN) powder  Status:  Discontinued       As needed 06/29/17 1515 06/29/17 1714   06/29/17 1450  vancomycin (VANCOCIN) 1,000 mg in sodium chloride 0.9 % 1,000 mL irrigation  Status:  Discontinued       As needed 06/29/17 1451 06/29/17 1714   06/29/17 1345  vancomycin (VANCOCIN) 1,000 mg in sodium chloride 0.9 % 1,000 mL irrigation      Irrigation To Surgery 06/29/17 1330 06/30/17 1345   06/15/17 0816  vancomycin (VANCOCIN) powder  Status:  Discontinued       As needed 06/15/17 0817 06/15/17 0854   06/07/17 1400  cefUROXime (ZINACEF) 1.5 g in dextrose 5 % 50 mL IVPB     1.5 g 100 mL/hr over 30 Minutes Intravenous To ShortStay Surgical 06/07/17 0125 06/07/17 1527   06/07/17 0745  vancomycin (VANCOCIN) 1,000 mg in sodium chloride 0.9 % 1,000 mL  irrigation      Irrigation To Surgery 06/07/17 0744 06/07/17 1513   06/06/17 1639  gentamicin (GARAMYCIN) injection  Status:  Discontinued       As needed 06/06/17 1640 06/06/17 1715   06/06/17 1638  vancomycin (VANCOCIN) powder  Status:  Discontinued       As needed 06/06/17 1639 06/06/17 1715   06/06/17 1000  ceFAZolin (ANCEF) IVPB 2g/100 mL premix  Status:  Discontinued     2 g 200 mL/hr over 30 Minutes Intravenous Every 8 hours 06/06/17 0911 07/15/17 1457   06/06/17 0000  vancomycin (VANCOCIN) IVPB 1000 mg/200 mL premix  Status:  Discontinued     1,000 mg 200 mL/hr over 60 Minutes Intravenous Every 8 hours 06/05/17 2211 06/06/17 0911   06/05/17 2330  piperacillin-tazobactam (ZOSYN) IVPB 3.375 g  Status:  Discontinued     3.375 g 12.5 mL/hr over 240 Minutes Intravenous Every 8 hours 06/05/17 2211 06/06/17 0911   06/05/17 2200  cefTRIAXone (ROCEPHIN) 1 g in dextrose 5 % 50 mL IVPB  Status:  Discontinued     1 g 100 mL/hr over 30 Minutes Intravenous Every  24 hours 06/05/17 2140 06/05/17 2202   06/05/17 2200  azithromycin (ZITHROMAX) 500 mg in dextrose 5 % 250 mL IVPB  Status:  Discontinued     500 mg 250 mL/hr over 60 Minutes Intravenous Every 24 hours 06/05/17 2140 06/06/17 0911   06/05/17 1600  vancomycin (VANCOCIN) 1,500 mg in sodium chloride 0.9 % 500 mL IVPB     1,500 mg 250 mL/hr over 120 Minutes Intravenous  Once 06/05/17 1542 06/05/17 1855   06/05/17 1515  piperacillin-tazobactam (ZOSYN) IVPB 3.375 g     3.375 g 100 mL/hr over 30 Minutes Intravenous  Once 06/05/17 1512 06/05/17 1557       Objective: Vitals:   07/22/17 0454 07/22/17 1334 07/22/17 2141 07/23/17 0609  BP: 100/60 (!) 100/50 (!) 96/54 106/65  Pulse: 84 94 64 81  Resp: 17 18 18 18   Temp: 98.1 F (36.7 C) 98.6 F (37 C) 98.6 F (37 C) 98.6 F (37 C)  TempSrc:  Oral Oral   SpO2: 99% 96% 95% 96%  Weight:      Height:        Intake/Output Summary (Last 24 hours) at 07/23/2017 1043 Last data filed at  07/23/2017 0955 Gross per 24 hour  Intake 1080 ml  Output -  Net 1080 ml   Filed Weights   06/05/17 1407 06/06/17 0844 06/29/17 0557  Weight: 68 kg (150 lb) 70.3 kg (154 lb 15.7 oz) 70.4 kg (155 lb 3.3 oz)    Examination:   Awake pleasant playing game son computer eomi ncat no rash, SCM area has bandage s1 s 2no m cta b No le edema abd soft nt nd no rebound   Data Reviewed: I have personally reviewed following labs and imaging studies  CBC: Recent Labs  Lab 07/17/17 0349 07/20/17 1714 07/21/17 0420  WBC 5.2 7.6 8.7  HGB 9.8* 10.5* 10.7*  HCT 30.2* 31.8* 32.7*  MCV 88.3 87.6 87.9  PLT 154 233 286   Basic Metabolic Panel: Recent Labs  Lab 07/17/17 0349 07/18/17 0426 07/19/17 0809 07/20/17 1714 07/21/17 0420 07/22/17 0446  NA 134* 135 131*  --  138 139  K 3.2* 2.7* 3.6  --  3.1* 3.1*  CL 102 102 100*  --  101 105  CO2 21* 23 23  --  23 23  GLUCOSE 166* 150* 95  --  112* 141*  BUN <5* <5* 5*  --  <5* <5*  CREATININE 0.58* 0.57* 0.68 0.69 0.64 0.57*  CALCIUM 8.1* 8.2* 8.2*  --  8.7* 8.7*  MG  --  1.8  --   --   --  2.1   GFR: Estimated Creatinine Clearance: 128.3 mL/min (A) (by C-G formula based on SCr of 0.57 mg/dL (L)).  Radiology Studies: No results found.  Scheduled Meds: . enoxaparin (LOVENOX) injection  40 mg Subcutaneous Q24H  . gabapentin  300 mg Oral TID  . potassium chloride  40 mEq Oral BID  . zinc sulfate  220 mg Oral Daily   Continuous Infusions: . nafcillin IV Stopped (07/23/17 0823)     LOS: 48 days    Time spent in minutes: 15 minutes.  Pleas KochJai Doraine Schexnider, MD [facility] [employer]([phone]) [phone]   If 7PM-7AM, please contact night-coverage [email or web address] Password [email or web address] 07/23/2017, 10:43 AM

## 2017-07-24 LAB — BASIC METABOLIC PANEL
Anion gap: 10 (ref 5–15)
BUN: <5 mg/dL — ABNORMAL LOW (ref 6–20)
CALCIUM: 8.7 mg/dL — AB (ref 8.9–10.3)
CO2: 25 mmol/L (ref 22–32)
CREATININE: 0.87 mg/dL (ref 0.61–1.24)
Chloride: 106 mmol/L (ref 101–111)
GLUCOSE: 83 mg/dL (ref 65–99)
Potassium: 4 mmol/L (ref 3.5–5.1)
Sodium: 141 mmol/L (ref 135–145)

## 2017-07-24 MED ORDER — HYDROCORTISONE 1 % EX CREA
TOPICAL_CREAM | CUTANEOUS | Status: DC | PRN
  Filled 2017-07-24: qty 28

## 2017-07-24 MED ORDER — DIPHENHYDRAMINE HCL 25 MG PO CAPS
25.0000 mg | ORAL_CAPSULE | Freq: Three times a day (TID) | ORAL | Status: DC | PRN
  Administered 2017-07-24 – 2017-07-29 (×3): 25 mg via ORAL
  Filled 2017-07-24 (×2): qty 1

## 2017-07-24 NOTE — Progress Notes (Signed)
PROGRESS NOTE    Clifford BoschChristopher Tucker   XLK:[id number]  DOB: 1980-11-19  DOA: 06/05/2017 PCP: Patient, No Pcp Per   Brief Narrative:  37 y.o.MIV drug abuse and recurrent skin and soft tissue infections. He presented with neck/hip pain found to have a neck abscess, septic arthritis of the hip and now MSSA bacteremia. CT surgery, orthopedic surgery and infectious disease consulted.  Plan is for patient to complete his IV antibiotics on 08/09/2017.  Patient remains stable without acute issues. Spiked intermittent high fever since 2/22 without clear new source.  Chest x-ray and urine microscopy negative.  Blood cultures x2 from 2/23: Negative to date after 4 days.  Fever curve better.   Subjective:  No chenges sittign in bed  Assessment & Plan:  Bacteremia due to methicillin susceptible Staphylococcus aureus (MSSA) in the setting of IV drug use -Blood cultures 2/2+ for MSSA   -Patient was originally placed on vancomycin and Zosyn, transitioned to IV Ancef -Sternal aspirate showed MSSA, left hip wound cultures also MSSA -2D echo with no vegetation.  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  Can give Keflex thereafter if inflammatory markers still elevated. -Patient spiking intermittent fevers since 2/22.  Unclear source.  Chest x-ray & urine microscopy negative.  Blood cultures drawn 2/23-Negative to date.  Influenza panel PCR negative.  CT chest without contrast unremarkable.  Ancef changed to nafcillin 2/24. -As per ID follow-up, disseminated MSSA bacteremia/presumed endocarditis due to  sternoclavicular joint septic arthritis and left iliopsoas abscess.  ID felt that fevers may have been due to drug fever from cefazolin and hence was changed to nafcillin.  Still reported subjective fevers and hence ID recommended TEE to rule out vegetations. -TEE neg on 07/21/17 for Vegetations.  No further work up planned and fever curve is neg and has been stable continues to stabilize without new issue   Left hip abscess/iliopsoas septic arthritis, infectious myositis s/p I&D 06/06/17 -Status post left hip I&D with cultures done on 1/15, showing staph aureus -Status post drain placement 1/18 for iliacus abscess which was removed on 06/14/17 -Doppler ultrasound of the left lower extremity negative for DVT - he stated he had been having persistent pain and tenderness in the left hip.  Orthopedics reevaluated. - MRI showed fluid collection around the hip - 2/7- The patient was taken to the OR for another I and D and wound VAC removed. -Cultures taken on 06/29/2017 negative, final report. -Left hip surgical site wound has healed well without acute findings.  Pain in left hip/opioid use disorder- - judicious use of opioids.    Septic arthritis of the sternoclavicular joint, abscess  -June 15, 2017, status post irrigation of the right sternoclavicular wound, with a wound VAC placement which has been removed -CT surgery follow-up 2/20 appreciated.  Improving.  No acute findings on examination of sternoclavicular joint.  Repeat CT chest without contrast 2/24 shows no recurrent abscess or abnormal findings.  Multifocal pneumonia -Possible septic emboli to the lungs- resolved.  CT chest 2/24 shows resolution.   IV drug user -Per patient, has been using heroin-  [facility] but relapsed again in a month before the admission -Last heroin use a week ago prior to admission- no withdrawal noted in the hospital -polysubstance abuse counseling - Last evaluated by Dr. Oswaldo Done  on 2/12 who planned to re-assess him re transitioning to Suboxone-d/w D. Oswaldo Done who is willing to er-eeval patient for use of suboxone closer to end of hosp stay--will contact again closer to d/c time  Nicotine abuse -Nicotine patch -Tobacco cessation counseling-apparently had been smoking repeatedly in the bathroom.  Hypokalemia Persistent hypokalemia.  Unclear etiology.?  Renal wasting.  Replaced aggressively. Magnesium 1.8.   Potassium 3.1-->4.0, replace with Kdur 40 bid and rechekc peridoically  Leukopenia -Possibly due to Ancef, discontinued 2/24.  Resolved.  Anemia - Likely chronic disease. Stable.   DVT prophylaxis: sq Lovenox Code Status: Full code Disposition Plan: will stay at [facility] until IV antibiotic course completed if patient agrees.  Consultants:   CT surgery, ID, ortho, IR  Procedures:     Irrigation of chest wall with wound vac change 06/22/17.    Left hip I&D 06/06/17  Left hip I&D on 06/29/2017.  Antimicrobials:  Anti-infectives (From admission, onward)   Start     Dose/Rate Route Frequency Ordered Stop   07/15/17 1600  nafcillin 2 g in sodium chloride 0.9 % 100 mL IVPB     2 g 200 mL/hr over 30 Minutes Intravenous Every 4 hours 07/15/17 1458 08/09/17 2359   06/29/17 1517  gentamicin (GARAMYCIN) injection  Status:  Discontinued       As needed 06/29/17 1518 06/29/17 1714   06/29/17 1512  vancomycin (VANCOCIN) powder  Status:  Discontinued       As needed 06/29/17 1515 06/29/17 1714   06/29/17 1450  vancomycin (VANCOCIN) 1,000 mg in sodium chloride 0.9 % 1,000 mL irrigation  Status:  Discontinued       As needed 06/29/17 1451 06/29/17 1714   06/29/17 1345  vancomycin (VANCOCIN) 1,000 mg in sodium chloride 0.9 % 1,000 mL irrigation      Irrigation To Surgery 06/29/17 1330 06/30/17 1345   06/15/17 0816  vancomycin (VANCOCIN) powder  Status:  Discontinued       As needed 06/15/17 0817 06/15/17 0854   06/07/17 1400  cefUROXime (ZINACEF) 1.5 g in  dextrose 5 % 50 mL IVPB     1.5 g 100 mL/hr over 30 Minutes Intravenous To ShortStay Surgical 06/07/17 0125 06/07/17 1527   06/07/17 0745  vancomycin (VANCOCIN) 1,000 mg in sodium chloride 0.9 % 1,000 mL irrigation      Irrigation To Surgery 06/07/17 0744 06/07/17 1513   06/06/17 1639  gentamicin (GARAMYCIN) injection  Status:  Discontinued       As needed 06/06/17 1640 06/06/17 1715   06/06/17 1638  vancomycin (VANCOCIN) powder  Status:  Discontinued       As needed 06/06/17 1639 06/06/17 1715   06/06/17 1000  ceFAZolin (ANCEF) IVPB 2g/100 mL premix  Status:  Discontinued     2 g 200 mL/hr over 30 Minutes Intravenous Every 8 hours 06/06/17 0911 07/15/17 1457   06/06/17 0000  vancomycin (VANCOCIN) IVPB 1000 mg/200 mL premix  Status:  Discontinued     1,000 mg 200 mL/hr over 60 Minutes Intravenous Every 8 hours 06/05/17 2211 06/06/17 0911   06/05/17 2330  piperacillin-tazobactam (ZOSYN) IVPB 3.375 g  Status:  Discontinued     3.375 g 12.5 mL/hr over 240 Minutes Intravenous Every 8 hours 06/05/17 2211 06/06/17 0911  06/05/17 2200  cefTRIAXone (ROCEPHIN) 1 g in dextrose 5 % 50 mL IVPB  Status:  Discontinued     1 g 100 mL/hr over 30 Minutes Intravenous Every 24 hours 06/05/17 2140 06/05/17 2202   06/05/17 2200  azithromycin (ZITHROMAX) 500 mg in dextrose 5 % 250 mL IVPB  Status:  Discontinued     500 mg 250 mL/hr over 60 Minutes Intravenous Every 24 hours 06/05/17 2140 06/06/17 0911   06/05/17 1600  vancomycin (VANCOCIN) 1,500 mg in sodium chloride 0.9 % 500 mL IVPB     1,500 mg 250 mL/hr over 120 Minutes Intravenous  Once 06/05/17 1542 06/05/17 1855   06/05/17 1515  piperacillin-tazobactam (ZOSYN) IVPB 3.375 g     3.375 g 100 mL/hr over 30 Minutes Intravenous  Once 06/05/17 1512 06/05/17 1557       Objective: Vitals:   07/23/17 0609 07/23/17 1414 07/23/17 1955 07/24/17 0528  BP: 106/65 (!) 97/55 116/74 113/70  Pulse: 81 75 80 71  Resp: 18 16 16 17   Temp: 98.6 F (37 C) 97.9 F  (36.6 C) 98 F (36.7 C) 98 F (36.7 C)  TempSrc:  Oral    SpO2: 96% 98% 100% 100%  Weight:      Height:        Intake/Output Summary (Last 24 hours) at 07/24/2017 1300 Last data filed at 07/24/2017 0855 Gross per 24 hour  Intake 1340 ml  Output -  Net 1340 ml   Filed Weights   06/05/17 1407 06/06/17 0844 06/29/17 0557  Weight: 68 kg (150 lb) 70.3 kg (154 lb 15.7 oz) 70.4 kg (155 lb 3.3 oz)    Examination:   Awake  eomi ncat no rash, SCM area has bandage s1 s 2no m cta b No le edema abd soft nt nd no rebound   Data Reviewed: I have personally reviewed following labs and imaging studies  CBC: Recent Labs  Lab 07/20/17 1714 07/21/17 0420  WBC 7.6 8.7  HGB 10.5* 10.7*  HCT 31.8* 32.7*  MCV 87.6 87.9  PLT 233 286   Basic Metabolic Panel: Recent Labs  Lab 07/18/17 0426 07/19/17 0809 07/20/17 1714 07/21/17 0420 07/22/17 0446 07/24/17 0830  NA 135 131*  --  138 139 141  K 2.7* 3.6  --  3.1* 3.1* 4.0  CL 102 100*  --  101 105 106  CO2 23 23  --  23 23 25   GLUCOSE 150* 95  --  112* 141* 83  BUN <5* 5*  --  <5* <5* <5*  CREATININE 0.57* 0.68 0.69 0.64 0.57* 0.87  CALCIUM 8.2* 8.2*  --  8.7* 8.7* 8.7*  MG 1.8  --   --   --  2.1  --    GFR: Estimated Creatinine Clearance: 118 mL/min (by C-G formula based on SCr of 0.87 mg/dL).  Radiology Studies: No results found.  Scheduled Meds: . enoxaparin (LOVENOX) injection  40 mg Subcutaneous Q24H  . gabapentin  300 mg Oral TID  . potassium chloride  40 mEq Oral TID  . zinc sulfate  220 mg Oral Daily   Continuous Infusions: . nafcillin IV Stopped (07/24/17 1244)     LOS: 49 days    Time spent in minutes: 15 minutes.  Pleas Koch, MD [phone]   If 7PM-7AM, please contact night-coverage [email or web address] Password [email or web address] 07/24/2017, 1:00 PM

## 2017-07-25 MED ORDER — DIPHENHYDRAMINE HCL 25 MG PO CAPS
25.0000 mg | ORAL_CAPSULE | Freq: Three times a day (TID) | ORAL | Status: DC
  Administered 2017-07-25 – 2017-07-31 (×18): 25 mg via ORAL
  Filled 2017-07-25 (×22): qty 1

## 2017-07-25 NOTE — Progress Notes (Signed)
PROGRESS NOTE    Clifford Tucker   RUE:[id number]  DOB: [birth year]  DOA: 06/05/2017 PCP: Patient, No Pcp Per   Brief Narrative:  37 y.o.MIV drug abuse and recurrent skin and soft tissue infections. He presented with neck/hip pain found to have a neck abscess, septic arthritis of the hip and now MSSA bacteremia. CT surgery, orthopedic surgery and infectious disease consulted.  Plan is for patient to complete his IV antibiotics on 08/09/2017.  Patient remains stable without acute issues. Spiked intermittent high fever since 2/22 without clear new source.  Chest x-ray and urine microscopy negative.  Blood cultures x2 from 2/23: Negative to date after 4 days.  Fever curve better.   Subjective:  Rash started last pm No better with prn benadryl eaitng drinkin no new other changes  Assessment & Plan:  Bacteremia due to methicillin susceptible Staphylococcus aureus (MSSA) in the setting of IV drug use -Blood cultures 2/2+ for MSSA   -Patient was originally placed on vancomycin and Zosyn, transitioned to IV Ancef -Sternal aspirate showed MSSA, left hip wound cultures also MSSA -2D echo with no vegetation.  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  Can give Keflex thereafter if inflammatory markers still elevated. -Patient spiking intermittent fevers since 2/22.  Unclear source.  Chest x-ray & urine microscopy negative.  Blood cultures drawn 2/23-Negative to date.  Influenza panel PCR negative.  CT chest without contrast unremarkable.  Ancef changed to nafcillin 2/24. 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  ID felt that fevers may have been due to drug fever from cefazolin and hence was changed to nafcillin.  Still reported subjective fevers and hence ID recommended TEE to rule out vegetations. -TEE neg on 07/21/17 for Vegetations.  No further work up planned and fever curve is neg and has been stable continues to stabilize without new issue   Rash-papular-would continue benadryl for now--if extensions, consider starting topical steroids and d/w Id whether to change Abx once again [althogh Nafcillin doesn't typically caus erash]  Left hip abscess/iliopsoas septic arthritis, infectious myositis s/p I&D 06/06/17 -Status post left hip I&D with cultures done on 1/15, showing staph aureus -Status post drain placement 1/18 for iliacus abscess which was removed on 06/14/17 -Doppler ultrasound of the left lower extremity negative for DVT - he stated he had been having persistent pain and tenderness in the left hip.  Orthopedics reevaluated. - MRI showed fluid collection around the hip - 2/7- The patient was taken to the OR for another I and D and wound VAC removed. -Cultures taken on 06/29/2017 negative, final report. -Left hip surgical site wound has healed well without acute findings.  Pain in left hip/opioid use disorder- - judicious use of opioids.    Septic arthritis of the sternoclavicular joint, abscess  -June 15, 2017, status post irrigation of the right sternoclavicular wound, with a wound VAC placement which has been removed -CT surgery follow-up 2/20 appreciated.  Improving.  No acute findings on examination of sternoclavicular joint.  Repeat CT chest without  contrast 2/24 shows no recurrent abscess or abnormal findings.  Multifocal pneumonia -Possible septic emboli to the lungs- resolved.  CT chest 2/24 shows resolution.   IV drug user -Per patient, has been  using heroin- [facility] but relapsed again in a month before the admission -Last heroin use a week ago prior to admission- no withdrawal noted in the hospital -polysubstance abuse counseling - Last evaluated by Dr. Oswaldo Done on 2/12 who planned to re-assess him re transitioning to Suboxone-d/w D. Oswaldo Done who is willing to er-eeval patient for use of suboxone closer to end of hosp stay--will contact again closer to d/c time  Nicotine abuse -Nicotine patch -Tobacco cessation counseling-apparently had been smoking repeatedly in the bathroom.  Hypokalemia Persistent hypokalemia.  Unclear etiology.?  Renal wasting.  Replaced aggressively. Magnesium 1.8.   Potassium 3.1-->4.0, replace with Kdur 40 bid and rechekc peridoically  Leukopenia -Possibly due to Ancef, discontinued 2/24.  Resolved.  Anemia - Likely chronic disease. Stable.   DVT prophylaxis: sq Lovenox Code Status: Full code Disposition Plan: will stay at [facility] until IV antibiotic course completed if patient agrees.  Consultants:   CT surgery, ID, ortho, IR  Procedures:     Irrigation of chest wall with wound vac change 06/22/17.    Left hip I&D 06/06/17  Left hip I&D on 06/29/2017.  Antimicrobials:  Anti-infectives (From admission, onward)   Start     Dose/Rate Route Frequency Ordered Stop   07/15/17 1600  nafcillin 2 g in sodium chloride 0.9 % 100 mL IVPB     2 g 200 mL/hr over 30 Minutes Intravenous Every 4 hours 07/15/17 1458 08/09/17 2359   06/29/17 1517  gentamicin (GARAMYCIN) injection  Status:  Discontinued       As needed 06/29/17 1518 06/29/17 1714   06/29/17 1512  vancomycin (VANCOCIN) powder  Status:  Discontinued       As needed 06/29/17 1515 06/29/17 1714   06/29/17 1450  vancomycin (VANCOCIN) 1,000 mg in sodium chloride 0.9 % 1,000 mL irrigation  Status:  Discontinued       As needed 06/29/17 1451 06/29/17 1714   06/29/17 1345  vancomycin (VANCOCIN) 1,000 mg in sodium  chloride 0.9 % 1,000 mL irrigation      Irrigation To Surgery 06/29/17 1330 06/30/17 1345   06/15/17 0816  vancomycin (VANCOCIN) powder  Status:  Discontinued       As needed 06/15/17 0817 06/15/17 0854   06/07/17 1400  cefUROXime (ZINACEF) 1.5 g in dextrose 5 % 50 mL IVPB     1.5 g 100 mL/hr over 30 Minutes Intravenous To [facility]Stay Surgical 06/07/17 0125 06/07/17 1527   06/07/17 0745  vancomycin (VANCOCIN) 1,000 mg in sodium chloride 0.9 % 1,000 mL irrigation      Irrigation To Surgery 06/07/17 0744 06/07/17 1513   06/06/17 1639  gentamicin (GARAMYCIN) injection  Status:  Discontinued       As needed 06/06/17 1640 06/06/17 1715   06/06/17 1638  vancomycin (VANCOCIN) powder  Status:  Discontinued       As needed 06/06/17 1639 06/06/17 1715   06/06/17 1000  ceFAZolin (ANCEF) IVPB 2g/100 mL premix  Status:  Discontinued     2 g 200 mL/hr over 30 Minutes Intravenous Every 8 hours 06/06/17 0911 07/15/17 1457   06/06/17 0000  vancomycin (VANCOCIN) IVPB 1000 mg/200 mL premix  Status:  Discontinued     1,000 mg 200 mL/hr over 60 Minutes Intravenous Every 8 hours 06/05/17 2211  06/06/17 0911   06/05/17 2330  piperacillin-tazobactam (ZOSYN) IVPB 3.375 g  Status:  Discontinued     3.375 g 12.5 mL/hr over 240 Minutes Intravenous Every 8 hours 06/05/17 2211 06/06/17 0911   06/05/17 2200  cefTRIAXone (ROCEPHIN) 1 g in dextrose 5 % 50 mL IVPB  Status:  Discontinued     1 g 100 mL/hr over 30 Minutes Intravenous Every 24 hours 06/05/17 2140 06/05/17 2202   06/05/17 2200  azithromycin (ZITHROMAX) 500 mg in dextrose 5 % 250 mL IVPB  Status:  Discontinued     500 mg 250 mL/hr over 60 Minutes Intravenous Every 24 hours 06/05/17 2140 06/06/17 0911   06/05/17 1600  vancomycin (VANCOCIN) 1,500 mg in sodium chloride 0.9 % 500 mL IVPB     1,500 mg 250 mL/hr over 120 Minutes Intravenous  Once 06/05/17 1542 06/05/17 1855   06/05/17 1515  piperacillin-tazobactam (ZOSYN) IVPB 3.375 g     3.375 g 100 mL/hr over 30  Minutes Intravenous  Once 06/05/17 1512 06/05/17 1557       Objective: Vitals:   07/24/17 1331 07/24/17 2234 07/25/17 0516 07/25/17 1305  BP: 113/65 (!) 96/52 100/62 (!) 99/59  Pulse: (!) 105 76 71 78  Resp: 16 16 16 16   Temp: 97.8 F (36.6 C) 98.1 F (36.7 C) 98.9 F (37.2 C) 98.7 F (37.1 C)  TempSrc: Oral Oral Oral Oral  SpO2: 95% 98% 99% 100%  Weight:      Height:        Intake/Output Summary (Last 24 hours) at 07/25/2017 1320 Last data filed at 07/25/2017 1100 Gross per 24 hour  Intake 700 ml  Output -  Net 700 ml   Filed Weights   06/05/17 1407 06/06/17 0844 06/29/17 0557  Weight: 68 kg (150 lb) 70.3 kg (154 lb 15.7 oz) 70.4 kg (155 lb 3.3 oz)    Examination:   Awake  eomi ncat no rash, SCM area has bandage-L chest and arm has fine papluar rash over skin area without blacnign nor confluence s1 s 2no m cta b No le edema abd soft nt nd no rebound   Data Reviewed: I have personally reviewed following labs and imaging studies  CBC: Recent Labs  Lab 07/20/17 1714 07/21/17 0420  WBC 7.6 8.7  HGB 10.5* 10.7*  HCT 31.8* 32.7*  MCV 87.6 87.9  PLT 233 286   Basic Metabolic Panel: Recent Labs  Lab 07/19/17 0809 07/20/17 1714 07/21/17 0420 07/22/17 0446 07/24/17 0830  NA 131*  --  138 139 141  K 3.6  --  3.1* 3.1* 4.0  CL 100*  --  101 105 106  CO2 23  --  23 23 25   GLUCOSE 95  --  112* 141* 83  BUN 5*  --  <5* <5* <5*  CREATININE 0.68 0.69 0.64 0.57* 0.87  CALCIUM 8.2*  --  8.7* 8.7* 8.7*  MG  --   --   --  2.1  --    GFR: Estimated Creatinine Clearance: 118 mL/min (by C-G formula based on SCr of 0.87 mg/dL).  Radiology Studies: No results found.  Scheduled Meds: . diphenhydrAMINE  25 mg Oral Q8H  . enoxaparin (LOVENOX) injection  40 mg Subcutaneous Q24H  . gabapentin  300 mg Oral TID  . potassium chloride  40 mEq Oral TID  . zinc sulfate  220 mg Oral Daily   Continuous Infusions: . nafcillin IV Stopped (07/25/17 1217)     LOS: 50  days  Time spent in minutes: 15 minutes.  Pleas KochJai Elbert Spickler, MD [facility]([phone]   If 7PM-7AM, please contact night-coverage [email or web address] Password [email or web address] 07/25/2017, 1:20 PM

## 2017-07-26 LAB — CBC WITH DIFFERENTIAL/PLATELET
Basophils Absolute: 0 10*3/uL (ref 0.0–0.1)
Basophils Relative: 0 %
EOS PCT: 4 %
Eosinophils Absolute: 0.3 10*3/uL (ref 0.0–0.7)
HCT: 27.8 % — ABNORMAL LOW (ref 39.0–52.0)
Hemoglobin: 8.8 g/dL — ABNORMAL LOW (ref 13.0–17.0)
LYMPHS ABS: 1.3 10*3/uL (ref 0.7–4.0)
LYMPHS PCT: 16 %
MCH: 28.9 pg (ref 26.0–34.0)
MCHC: 31.7 g/dL (ref 30.0–36.0)
MCV: 91.1 fL (ref 78.0–100.0)
MONO ABS: 0.8 10*3/uL (ref 0.1–1.0)
Monocytes Relative: 9 %
NEUTROS ABS: 5.9 10*3/uL (ref 1.7–7.7)
Neutrophils Relative %: 71 %
PLATELETS: 326 10*3/uL (ref 150–400)
RBC: 3.05 MIL/uL — ABNORMAL LOW (ref 4.22–5.81)
RDW: 16.1 % — AB (ref 11.5–15.5)
WBC: 8.3 10*3/uL (ref 4.0–10.5)

## 2017-07-26 LAB — RENAL FUNCTION PANEL
Albumin: 2.9 g/dL — ABNORMAL LOW (ref 3.5–5.0)
Anion gap: 8 (ref 5–15)
CHLORIDE: 106 mmol/L (ref 101–111)
CO2: 25 mmol/L (ref 22–32)
CREATININE: 0.62 mg/dL (ref 0.61–1.24)
Calcium: 8.8 mg/dL — ABNORMAL LOW (ref 8.9–10.3)
Glucose, Bld: 94 mg/dL (ref 65–99)
Phosphorus: 4.2 mg/dL (ref 2.5–4.6)
Potassium: 3.9 mmol/L (ref 3.5–5.1)
Sodium: 139 mmol/L (ref 135–145)

## 2017-07-26 NOTE — Progress Notes (Signed)
      [address]       [place] [id number]             [phone]        5 Days Post-Op Procedure(s) (LRB): TRANSESOPHAGEAL ECHOCARDIOGRAM (TEE) (N/A)  Subjective: Patient irritated this am.  Objective: Vital signs in last 24 hours: Temp:  [98.2 F (36.8 C)-98.7 F (37.1 C)] 98.2 F (36.8 C) (03/06 0517) Pulse Rate:  [78-84] 80 (03/06 0517) Resp:  [16] 16 (03/06 0517) BP: (97-106)/(58-61) 97/58 (03/06 0517) SpO2:  [98 %-100 %] 99 % (03/06 0517)   Current Weight  06/29/17 155 lb 3.3 oz (70.4 kg)    Intake/Output from previous day: 03/05 0701 - 03/06 0700 In: 720 [P.O.:120; IV Piggyback:600] Out: -    Physical Exam:  Cardiovascular: RRR Wound: Some granulation tissue present. No purulence. Wound is still deep on right side and left corner has "filled in"  Lab Results: CBC: Recent Labs    07/26/17 0500  WBC 8.3  HGB 8.8*  HCT 27.8*  PLT 326   BMET:  Recent Labs    07/24/17 0830 07/26/17 0428  NA 141 139  K 4.0 3.9  CL 106 106  CO2 25 25  GLUCOSE 83 94  BUN <5* <5*  CREATININE 0.87 0.62  CALCIUM 8.7* 8.8*    PT/INR:  Lab Results  Component Value Date   INR 1.11 06/07/2017   ABG:  INR: Will add last result for INR, ABG once components are confirmed Will add last 4 CBG results once components are confirmed  Assessment/Plan:  1. CV - SR 2.  ID-bacteremia due to MSSA (right sterno clavicular joint and left hip) secondary to IV drug use. On Naficillin (last dose 03/16). Dressing changes to continue for right sterno clavicular joint wound. 3. Anemia- H and H decreased to 8.8 and 27.8 4. Management per primary  Donielle M ZimmermanPA-C 07/26/2017,8:00 AM

## 2017-07-26 NOTE — Progress Notes (Signed)
[facility]  Date of Admission:  06/05/2017             ASSESSMENT/PLAN  Clifford Tucker continues to be treated for MSSA bacteremia and infections of the sternoclavicular joint and left hip. He has remained afebrile since previous evaluation now receiving nafcillin. Appears to have a diffuse maculopapular rash which may be related to the antibiotic otherwise tolerating it well. The rash is new in the last 3 days but has improved today as is receiving benedryl.  TEE and repeat blood cultures were negative. Due to his previous history of IVDU he was not a candidate for placement in SNF and has remained hospitalized. He expresses frustration with his end date of antibiotics as I have evaluated to be 08/05/17. He did meet with Dr. Oswaldo DoneVincent for opoid use disorder in the past and would like to follow up with him.   1. Continue current nafcillin with end date of 08/05/17. 2. Will discuss follow up with Dr. Oswaldo DoneVincent for opoid use disorder.  3. Pull PICC at the end of treatment. 4. Recheck inflammatory markers, if remain elevated consider course of oral Keflex at discharge.  ID will continue to be available during his hospitalization.   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 diphenhydrAMINE  25 mg Oral Q8H  . enoxaparin (LOVENOX) injection  40 mg Subcutaneous Q24H  . gabapentin  300 mg Oral TID  . potassium chloride  40 mEq Oral TID  . zinc sulfate  220 mg Oral Daily    SUBJECTIVE:  Clifford Tucker continues to be receiving Nafcillin for MSSA bacteremia and joint infections at sternal  clavicular joint and left hip. Primary team was concern about him wishing to leave AMA. ID asked to confirm necessity of antibiotic therapy with the patient.  He has remained afebrile with no leukocytosis. Has noted a rash in the last 3 days which is slowly improving.   No Known Allergies   Review of Systems: Review of Systems  Constitutional: Negative for chills and fever.  Respiratory: Negative for cough and shortness of breath.   Cardiovascular: Negative for chest pain and leg swelling.  Gastrointestinal: Negative for constipation, diarrhea, nausea and vomiting.  Skin: Positive for rash.  Neurological: Negative for weakness and headaches.      OBJECTIVE: Vitals:   07/25/17 1305 07/25/17 2142 07/26/17 0517 07/26/17 1400  BP: (!) 99/59 106/61 (!) 97/58 100/67  Pulse: 78 84 80 88  Resp: 16 16 16 18   Temp: 98.7 F (37.1 C) 98.3 F (36.8 C) 98.2 F (36.8 C) 99 F (37.2 C)  TempSrc: Oral Oral Oral   SpO2: 100% 98% 99% 100%  Weight:      Height:       Body mass index is 22.27 kg/m.  Physical Exam  Constitutional:  Sitting with the head of the bed up; His speech expresses frustration and agitation.   Neck: Neck supple.  Cardiovascular: Normal rate, regular rhythm, normal heart sounds and intact distal pulses. Exam reveals no gallop and no friction rub.  No murmur heard. Pulmonary/Chest: Effort normal and breath sounds normal. No respiratory distress. He has no wheezes. He has no rales. He exhibits no tenderness.  Abdominal: Soft. Bowel sounds are normal.  Lymphadenopathy:    He has no cervical adenopathy.    Lab Results Lab Results  Component Value Date   WBC 8.3 07/26/2017   HGB 8.8 (L) 07/26/2017   HCT 27.8 (L) 07/26/2017   MCV 91.1 07/26/2017   PLT 326 07/26/2017    Lab Results  Component Value Date   CREATININE 0.62 07/26/2017   BUN <5 (L) 07/26/2017   NA 139 07/26/2017   K 3.9 07/26/2017   CL 106 07/26/2017   CO2 25 07/26/2017    Lab Results    Component Value Date   ALT 29 07/16/2017   AST 67 (H) 07/16/2017   ALKPHOS 100 07/16/2017   BILITOT 0.4 07/16/2017     Microbiology: No results found for this or any previous visit (from the past 240 hour(s)).   Marcos Eke, NP [facility] Infectious [facility] [phone] Pager  07/26/2017  2:23 PM

## 2017-07-26 NOTE — Progress Notes (Signed)
PROGRESS NOTE    Clifford Tucker   WUJ:[id number]  DOB: December 09, 1980  DOA: 06/05/2017 PCP: Patient, No Pcp Per   Brief Narrative:  37 y.o.MIV drug abuse and recurrent skin and soft tissue infections. He presented with neck/hip pain found to have a neck abscess, septic arthritis of the hip and now MSSA bacteremia. CT surgery, orthopedic surgery and infectious disease consulted.  Plan is for patient to complete his IV antibiotics on 08/09/2017.  Patient remains stable without acute issues. Spiked intermittent high fever since 2/22 without clear new source.  Chest x-ray and urine microscopy negative.  Blood cultures x2 from 2/23: Negative to date.  Fever resolved.  New skin rash noted, reportedly improving.  On 3/6, patient insisted on having PICC line removed and going home on oral antibiotics.  Seen by ID who recommended continued IV antibiotics until end date of 3/16.   Subjective: Seen this morning.  Patient expressed frustration regarding multiple aspects of his hospitalization including prolonged stay, frequent interruption/disturbance by multiple hospital staff doing their work i.e. nursing care etc., intermittent issues i.e. fevers/skin rash in his perception that these were not being addressed in a timely fashion i.e. TEE.  Patient expressed unhappiness with care provided by this MD stating that he kept being interrupted while speaking.  I expressed apologies and indicated that this was inadvertent.  He then became rude, asking me to do my job, hospital needs to hire more staff, used the F word and insisted that he wanted a different physician rather than me.  I advised him that I will discuss this with [facility] director on call to see if we can find a different MD to see him from 3/7.  Assessment & Plan:  Bacteremia due to methicillin susceptible Staphylococcus aureus (MSSA) in the setting of IV drug use -Blood cultures 2/2+ for MSSA   -Patient was originally placed on vancomycin and Zosyn,  transitioned to IV Ancef -Sternal aspirate showed MSSA, left hip wound cultures also MSSA -2D echo with no vegetation.  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  Can give Keflex thereafter if inflammatory markers still elevated. -Patient spiking intermittent fevers since 2/22.  Unclear source.  Chest x-ray & urine microscopy negative.  Blood cultures drawn 2/23-Negative to date.  Influenza panel PCR negative.  CT chest without contrast unremarkable.  Ancef changed to nafcillin 2/24. 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  ID felt that fevers may have been due to drug fever from cefazolin and hence was changed to nafcillin.  -TEE neg on 07/21/17 for Vegetations.   -On 3/6, patient insisted that we change his antibiotics to oral, pull PICC line and discharge him.  Advised him that this would not be the most appropriate course of treatment.  Consulted ID who reiterated continued IV antibiotics through 08/05/17.   Rash As per ID follow-up, the diffuse maculopapular rash may be related to the antibiotics which he seems to be tolerating well otherwise and rashes improving with Benadryl.  Recommends continue nafcillin.  Continue to  monitor.  Left hip abscess/iliopsoas septic arthritis, infectious myositis s/p I&D 06/06/17 -Status post left hip I&D with cultures done on 1/15, showing staph aureus -Status post drain placement 1/18 for iliacus abscess which was removed on 06/14/17 -Doppler ultrasound of  the left lower extremity negative for DVT - he stated he had been having persistent pain and tenderness in the left hip.  Orthopedics reevaluated. - MRI showed fluid collection around the hip - 2/7- The patient was taken to the OR for another I and D and wound VAC removed. -Cultures taken on 06/29/2017 negative, final report. -Left hip surgical site wound has healed well without acute findings.  Pain in left hip/opioid use disorder- - judicious use of opioids.    Septic arthritis of the sternoclavicular joint, abscess  -June 15, 2017, status post irrigation of the right sternoclavicular wound, with a wound VAC placement which has been removed -CT surgery follow-up 2/20 appreciated.  Improving.  No acute findings on examination of sternoclavicular joint.  Repeat CT chest without contrast 2/24 shows no recurrent abscess or abnormal findings.  Multifocal pneumonia -Possible septic emboli to the lungs- resolved.  CT chest 2/24 shows resolution.   IV drug user -Per patient, has been using heroin- [facility] but relapsed again in a month before the admission -Last heroin use a week ago prior to admission- no withdrawal noted in the hospital -polysubstance abuse counseling - Last evaluated by Dr. Oswaldo Done on 2/12 who planned to re-assess him re transitioning to Suboxone- Dr. Mahala Menghini d/w D. Oswaldo Done who is willing to er-eeval patient for use of suboxone closer to end of hosp stay--will contact again closer to d/c time  Nicotine abuse -Nicotine patch -Tobacco cessation counseling-apparently had been smoking repeatedly in the bathroom.  Hypokalemia Persistent hypokalemia.  Unclear etiology.?  Renal wasting.  Replaced aggressively. Magnesium 1.8.   Potassium 3.1-->4.0, replace with Kdur 40 bid and rechekc peridoically  Leukopenia -Possibly due to Ancef, discontinued 2/24.  Resolved.  Anemia - Likely chronic disease.  Hemoglobin has dropped from 10.7 on 3/1-8.8 in  the absence of overt bleeding.  May be lab fluctuation.  Follow CBC in a.m.   DVT prophylaxis: sq Lovenox Code Status: Full code Disposition Plan: will stay at [facility] until IV antibiotic course completed if patient agrees.  Consultants:   CT surgery, ID, ortho, IR  Procedures:     Irrigation of chest wall with wound vac change 06/22/17.    Left hip I&D 06/06/17  Left hip I&D on 06/29/2017.  Antimicrobials:  Anti-infectives (From admission, onward)   Start     Dose/Rate Route Frequency Ordered Stop   07/15/17 1600  nafcillin 2 g in sodium chloride 0.9 % 100 mL IVPB     2 g 200 mL/hr over 30 Minutes Intravenous Every 4 hours 07/15/17 1458 08/09/17 2359   06/29/17 1517  gentamicin (GARAMYCIN) injection  Status:  Discontinued       As needed 06/29/17 1518 06/29/17 1714   06/29/17 1512  vancomycin (VANCOCIN) powder  Status:  Discontinued       As needed 06/29/17 1515 06/29/17 1714   06/29/17 1450  vancomycin (VANCOCIN) 1,000 mg in sodium chloride 0.9 % 1,000 mL irrigation  Status:  Discontinued       As needed 06/29/17 1451 06/29/17 1714   06/29/17 1345  vancomycin (VANCOCIN) 1,000 mg in sodium chloride 0.9 % 1,000 mL irrigation      Irrigation To Surgery 06/29/17 1330 06/30/17 1345   06/15/17  1610  vancomycin (VANCOCIN) powder  Status:  Discontinued       As needed 06/15/17 0817 06/15/17 0854   06/07/17 1400  cefUROXime (ZINACEF) 1.5 g in dextrose 5 % 50 mL IVPB     1.5 g 100 mL/hr over 30 Minutes Intravenous To [facility]Stay Surgical 06/07/17 0125 06/07/17 1527   06/07/17 0745  vancomycin (VANCOCIN) 1,000 mg in sodium chloride 0.9 % 1,000 mL irrigation      Irrigation To Surgery 06/07/17 0744 06/07/17 1513   06/06/17 1639  gentamicin (GARAMYCIN) injection  Status:  Discontinued       As needed 06/06/17 1640 06/06/17 1715   06/06/17 1638  vancomycin (VANCOCIN) powder  Status:  Discontinued       As needed 06/06/17 1639 06/06/17 1715   06/06/17 1000  ceFAZolin (ANCEF) IVPB 2g/100  mL premix  Status:  Discontinued     2 g 200 mL/hr over 30 Minutes Intravenous Every 8 hours 06/06/17 0911 07/15/17 1457   06/06/17 0000  vancomycin (VANCOCIN) IVPB 1000 mg/200 mL premix  Status:  Discontinued     1,000 mg 200 mL/hr over 60 Minutes Intravenous Every 8 hours 06/05/17 2211 06/06/17 0911   06/05/17 2330  piperacillin-tazobactam (ZOSYN) IVPB 3.375 g  Status:  Discontinued     3.375 g 12.5 mL/hr over 240 Minutes Intravenous Every 8 hours 06/05/17 2211 06/06/17 0911   06/05/17 2200  cefTRIAXone (ROCEPHIN) 1 g in dextrose 5 % 50 mL IVPB  Status:  Discontinued     1 g 100 mL/hr over 30 Minutes Intravenous Every 24 hours 06/05/17 2140 06/05/17 2202   06/05/17 2200  azithromycin (ZITHROMAX) 500 mg in dextrose 5 % 250 mL IVPB  Status:  Discontinued     500 mg 250 mL/hr over 60 Minutes Intravenous Every 24 hours 06/05/17 2140 06/06/17 0911   06/05/17 1600  vancomycin (VANCOCIN) 1,500 mg in sodium chloride 0.9 % 500 mL IVPB     1,500 mg 250 mL/hr over 120 Minutes Intravenous  Once 06/05/17 1542 06/05/17 1855   06/05/17 1515  piperacillin-tazobactam (ZOSYN) IVPB 3.375 g     3.375 g 100 mL/hr over 30 Minutes Intravenous  Once 06/05/17 1512 06/05/17 1557       Objective: Vitals:   07/25/17 1305 07/25/17 2142 07/26/17 0517 07/26/17 1400  BP: (!) 99/59 106/61 (!) 97/58 100/67  Pulse: 78 84 80 88  Resp: 16 16 16 18   Temp: 98.7 F (37.1 C) 98.3 F (36.8 C) 98.2 F (36.8 C) 99 F (37.2 C)  TempSrc: Oral Oral Oral   SpO2: 100% 98% 99% 100%  Weight:      Height:        Intake/Output Summary (Last 24 hours) at 07/26/2017 1502 Last data filed at 07/26/2017 0612 Gross per 24 hour  Intake 600 ml  Output -  Net 600 ml   Filed Weights   06/05/17 1407 06/06/17 0844 06/29/17 0557  Weight: 68 kg (150 lb) 70.3 kg (154 lb 15.7 oz) 70.4 kg (155 lb 3.3 oz)    Examination:   General exam: Moderately built and nourished young male lying comfortably supine in bed. Respiratory system:  Clear to auscultation. Cardiovascular system: S1 and S2 heard, RRR.  No JVD, murmurs or pedal edema. Skin: Fine maculopapular rash seen on anterior chest and both upper extremities but not much on back. Psychiatric: Irritable.   Data Reviewed: I have personally reviewed following labs and imaging studies  CBC: Recent Labs  Lab 07/20/17 1714 07/21/17 0420  07/26/17 0500  WBC 7.6 8.7 8.3  NEUTROABS  --   --  5.9  HGB 10.5* 10.7* 8.8*  HCT 31.8* 32.7* 27.8*  MCV 87.6 87.9 91.1  PLT 233 286 326   Basic Metabolic Panel: Recent Labs  Lab 07/20/17 1714 07/21/17 0420 07/22/17 0446 07/24/17 0830 07/26/17 0428  NA  --  138 139 141 139  K  --  3.1* 3.1* 4.0 3.9  CL  --  101 105 106 106  CO2  --  23 23 25 25   GLUCOSE  --  112* 141* 83 94  BUN  --  <5* <5* <5* <5*  CREATININE 0.69 0.64 0.57* 0.87 0.62  CALCIUM  --  8.7* 8.7* 8.7* 8.8*  MG  --   --  2.1  --   --   PHOS  --   --   --   --  4.2   GFR: Estimated Creatinine Clearance: 128.3 mL/min (by C-G formula based on SCr of 0.62 mg/dL).  Radiology Studies: No results found.  Scheduled Meds: . diphenhydrAMINE  25 mg Oral Q8H  . enoxaparin (LOVENOX) injection  40 mg Subcutaneous Q24H  . gabapentin  300 mg Oral TID  . potassium chloride  40 mEq Oral TID  . zinc sulfate  220 mg Oral Daily   Continuous Infusions: . nafcillin IV Stopped (07/26/17 1300)     LOS: 51 days   Marcellus ScottAnand Hongalgi, MD, FACP, [facility]. [facility] Pager [phone]  If 7PM-7AM, please contact night-coverage [email or web address] Password [email or web address] 07/26/2017, 3:05 PM

## 2017-07-27 DIAGNOSIS — L02214 Cutaneous abscess of groin: Secondary | ICD-10-CM

## 2017-07-27 DIAGNOSIS — Z872 Personal history of diseases of the skin and subcutaneous tissue: Secondary | ICD-10-CM

## 2017-07-27 LAB — CBC
HCT: 33.6 % — ABNORMAL LOW (ref 39.0–52.0)
Hemoglobin: 10.8 g/dL — ABNORMAL LOW (ref 13.0–17.0)
MCH: 29.2 pg (ref 26.0–34.0)
MCHC: 32.1 g/dL (ref 30.0–36.0)
MCV: 90.8 fL (ref 78.0–100.0)
Platelets: 454 10*3/uL — ABNORMAL HIGH (ref 150–400)
RBC: 3.7 MIL/uL — AB (ref 4.22–5.81)
RDW: 16.4 % — ABNORMAL HIGH (ref 11.5–15.5)
WBC: 11.9 10*3/uL — AB (ref 4.0–10.5)

## 2017-07-27 LAB — CREATININE, SERUM
CREATININE: 0.78 mg/dL (ref 0.61–1.24)
GFR calc Af Amer: >60 mL/min (ref >60)
GFR calc non Af Amer: >60 mL/min (ref >60)

## 2017-07-27 NOTE — Plan of Care (Signed)
  Progressing Education: Knowledge of General Education information will improve 07/27/2017 0943 - Progressing by Quentin CornwallMadison, Samirah Scarpati, RN Health Behavior/Discharge Planning: Ability to manage health-related needs will improve 07/27/2017 0943 - Progressing by Quentin CornwallMadison, Sarahbeth Cashin, RN Clinical Measurements: Ability to maintain clinical measurements within normal limits will improve 07/27/2017 0943 - Progressing by Quentin CornwallMadison, Shandreka Dante, RN Will remain free from infection 07/27/2017 0943 - Progressing by Quentin CornwallMadison, Kaleb Linquist, RN Diagnostic test results will improve 07/27/2017 0943 - Progressing by Quentin CornwallMadison, Rovena Hearld, RN Respiratory complications will improve 07/27/2017 0943 - Progressing by Quentin CornwallMadison, Dartanyan Deasis, RN Cardiovascular complication will be avoided 07/27/2017 0943 - Progressing by Quentin CornwallMadison, Elham Fini, RN Coping: Level of anxiety will decrease 07/27/2017 0943 - Progressing by Quentin CornwallMadison, Tevon Berhane, RN Elimination: Will not experience complications related to bowel motility 07/27/2017 0943 - Progressing by Quentin CornwallMadison, Goldman Birchall, RN Will not experience complications related to urinary retention 07/27/2017 0943 - Progressing by Quentin CornwallMadison, Gisela Lea, RN Pain Managment: General experience of comfort will improve 07/27/2017 0943 - Progressing by Quentin CornwallMadison, Alick Lecomte, RN Safety: Ability to remain free from injury will improve 07/27/2017 0943 - Progressing by Quentin CornwallMadison, Dymond Gutt, RN Skin Integrity: Risk for impaired skin integrity will decrease 07/27/2017 0943 - Progressing by Quentin CornwallMadison, Maryrose Colvin, RN

## 2017-07-27 NOTE — Progress Notes (Signed)
INTERNAL MEDICINE TEACHING SERVICE - TRANSITION OF CARE NOTE LOS: 52 days  HPI: This is a 37 y.o.Mw/ IVDA and recurrent skin/soft tissue infections admitted 06/05/17 with right neck pain, left hip pain and shortness of breath. Work-up significant for MSSA bacteremia with diffuse seeding including multifocal pulmonary septic emboli, septic arthritis L-hip with osteomyelitis, septic arthritis of R-sternoclavicular joint, left iliacus abscess, large R-neck abscess and chest-wall abscess. He underwent irrigation and debridement of left hip 06/06/17, several debridements of right sternoclavicular abscess/chest abscess. TEE 07/21/17 surprisingly without vegetations. He is not felt to be suitable for outpatient management due to ongoing drug use and also was not suitable candidate for SNF. Tentative plan is for patient to complete his course of IV antibiotics with end date 08/05/17. Admission has been complicated by his labile moods and hostility. He has been suspected of tampering with his PICC and smoking in room.   MSSA Bacteremia, IVDA Overall the patient is extremely fortunate to have survived this illness. Most recent Bcx 2/23 negative. He has been afebrile since 2/25. His antibiotic course is set to be completed 3/16.   Septic arthritis of sternoclavicular joint, chest wall and right neck abscess S/p several wash-outs. Had wound-vac temporarily which was removed 2/22.   Multifocal pneumonia due to Septic Pulmonary Emboli: Resolved  OUD Seen by Dr. Oswaldo DoneVincent for possible treatment of OUD 2/12. Patient reportedly open to this follow-up.    Persistent Hypokalemia of unclear etiology: Has required aggressive replacement and is now on scheduled K-dur 40mEq TID. Magnesium has been consistently wnl.   Consultants: Ortho, ID, Cardiology, CVTS

## 2017-07-27 NOTE — Progress Notes (Signed)
Progress Note  I went by to see the patient today along with my colleague [facility] Director on Call Dr. Irene LimboGoodrich after Dr. Irene LimboGoodrich had discussed with him on the phone yesterday (please see my PN from 3/6) when patient had expressed willingness to see us together & see if he would let me continue to provide medical care for him.  However when we knocked on his door today, he stood at the door and refused to talk to us or engage in any conversation and refused to see us.  We advised him that his care will be transferred to another medical service other than [facility].  Dr. Irene LimboGoodrich advised that we approach the Internal Medicine teaching service to see if they would be kind enough to take over patient's care.  I discussed in detail with Dr. Cyndie ChimeGranfortuna, IM Teaching Service attending who has kindly agreed to take over patient's care.  [facility] will sign off at this time.  Please contact us for any further assistance.  Marcellus ScottAnand Davonte Siebenaler, MD, FACP, [facility]. [facility] Pager [phone]  If 7PM-7AM, please contact night-coverage [email or web address] Password [email or web address] 07/27/2017, 4:39 PM

## 2017-07-28 NOTE — Progress Notes (Addendum)
   Subjective: [name] was seen sitting up in his bed this morning. He said that he was comfortable and not in any acute pain. Voiced no concerns today and voiced understanding of the current treatment plan with IV antibiotics. He had some confusion on the IV abx end date and reports being told it was the 16th and then the 20th and asked for clarification.   Objective:  Vital signs in last 24 hours: Vitals:   07/27/17 0633 07/27/17 2100 07/28/17 0448 07/28/17 1326  BP: (!) 97/57 (!) 96/54 (!) 98/55 (!) 91/51  Pulse: 78 92 78 75  Resp: 18 18 18    Temp: 99.2 F (37.3 C) 98.4 F (36.9 C) 98.4 F (36.9 C) 98.5 F (36.9 C)  TempSrc: Oral Oral Oral Oral  SpO2: 99% 94% 98% 99%  Weight:      Height:       Physical Exam  Constitutional: He appears well-developed and well-nourished. No distress.  HENT:  Head: Normocephalic and atraumatic.  Eyes: Conjunctivae are normal.  Cardiovascular: Normal rate, regular rhythm and normal heart sounds.  Respiratory: Effort normal and breath sounds normal. No respiratory distress. He has no wheezes.  GI: Soft. Bowel sounds are normal. He exhibits no distension. There is no tenderness.  Musculoskeletal: He exhibits no edema.  Neurological: He is alert. No cranial nerve deficit.  Skin: He is not diaphoretic. No erythema.  picc line appeared clean, non-erythematous, and without surrounding swelling  Psychiatric: He has a normal mood and affect. His behavior is normal. Judgment and thought content normal.   Assessment/Plan: 37 year old IV drug abuser with recurrent skin and soft infections.  He presented initially on 06/05/17 and found to have neck abscess, septic arthritis of the hip and MSSA bacteremia.  CT surgery, or the surgery, and infectious disease have been on board.  He has had several washouts of his septic joints.  Currently receiving IV antibiotics and remains in the hospital to finish his antibiotic course.   MSSA Bacteremia secondary to  IVDA Originally started on vancomycin and Zosyn and has been de-escalated to IV nafcillin.  His TEE on 07/21/17 showed no vegetations.  A PICC line was placed but he is unable to be discharged home SNF as he has been suspected of tampering with his IV and been smoking in the bathroom.  Most recent blood cultures on 2/23 were negative. He remains afebrile last fever 2/25.  White count has trended up over the past couple days with most recent 11.9 yesterday.  Will repeat in the morning. ID recommendations from 07/09/17: Last positive cultures on 1/18.  8 week course nafcillin 2 g IV every 4 hours with end date 3/16.  If inflammatory markers still elevated at point can consider Keflex course.  Septic arthritis of sternoclavicular joint, chest wall and right neck abscess S/p several wash-outs. Had wound-vac temporarily which was removed 2/22.   Multifocal pneumonia due to Septic Pulmonary Emboli: Resolved  OUD Seen by Dr. Oswaldo DoneVincent for possible treatment of OUD 2/12. Patient reportedly open to this follow-up.   Will reconsult Dr. Oswaldo DoneVincent next week as patient approaches discharge.   Persistent Hypokalemia of unclear etiology: Has required aggressive replacement and is now on scheduled K-dur 40mEq TID. Magnesium has been consistently wnl.     Dispo: Anticipated discharge in approximately 8  day(s).   Valentino NoseBoswell, Maxen Rowland, MD 07/28/2017, 2:58 PM Pager: [phone]

## 2017-07-29 DIAGNOSIS — F1110 Opioid abuse, uncomplicated: Secondary | ICD-10-CM

## 2017-07-29 NOTE — Progress Notes (Signed)
   Subjective:  Resting in bed, no acute distress. Denies new changes overnight including no new fevers, chills, diaphoresis, uncontrolled pain, swelling, rash, or change at surgical incision sites.  Objective:  Vital signs in last 24 hours: Vitals:   07/27/17 2100 07/28/17 0448 07/28/17 1326 07/29/17 0553  BP: (!) 96/54 (!) 98/55 (!) 91/51 132/78  Pulse: 92 78 75 (!) 103  Resp: 18 18  18   Temp: 98.4 F (36.9 C) 98.4 F (36.9 C) 98.5 F (36.9 C) 97.8 F (36.6 C)  TempSrc: Oral Oral Oral Oral  SpO2: 94% 98% 99% 100%  Weight:      Height:       Physical Exam: General: resting in bed, NAD Cardiac: RRR, no rubs, murmurs or gallops Pulm: clear to auscultation bilaterally, moving normal volumes of air Abd: soft, nondistended Ext: right sternoclavicular surgical wound open without drainage or surrounding erythmea/sweeling. Left thigh surgical wound site healing well. PICC RUE. No pedal edema Neuro: alert and oriented X3   Assessment/Plan: 37 year old IV drug user with recurrent skin and soft infections.  He presented initially on 06/05/17 and found to have neck abscess, septic arthritis of the hip and MSSA bacteremia.  CT surgery, or the surgery, and infectious disease have been on board.  He has had several washouts of his septic joints.  Currently receiving IV antibiotics and remains in the hospital to finish his antibiotic course.   MSSA Bacteremia secondary to IVDA Originally started on vancomycin and Zosyn and has been de-escalated to IV nafcillin.  His TEE on 07/21/17 showed no vegetations.  A PICC line was placed but he is unable to be discharged home SNF as he has been suspected of tampering with his IV. Most recent blood cultures on 2/23 were negative. He remains afebrile last fever 2/25.  ID recommendations from 07/09/17: Last positive cultures on 1/18. 8 week course nafcillin 2 g IV every 4 hours with end date 3/16.  If inflammatory markers still elevated at point can consider  Keflex course. - Continue Nafcillin as scheduled through 3/16 - Check intermittent labs; CBC in AM  Septic arthritis of sternoclavicular joint, chest wall and right neck abscess S/p several wash-outs. Had wound-vac temporarily which was removed 2/22.   Multifocal pneumonia due to Septic Pulmonary Emboli: Resolved  OUD Seen by Dr. Oswaldo DoneVincent for possible treatment of OUD 2/12. Patient reportedly open to this follow-up.   Will reconsult Dr. Oswaldo DoneVincent next week as patient approaches discharge.   Persistent Hypokalemia of unclear etiology: Has required aggressive replacement and is now on scheduled K-dur 40mEq TID. Magnesium has been consistently wnl.  - BMET in AM  Dispo: Anticipated discharge in approximately 8  day(s).   Darreld McleanPatel, Vishal, MD  Internal Medicine PGY-3 07/29/2017, 11:50 AM

## 2017-07-30 DIAGNOSIS — Z8701 Personal history of pneumonia (recurrent): Secondary | ICD-10-CM

## 2017-07-30 DIAGNOSIS — F1190 Opioid use, unspecified, uncomplicated: Secondary | ICD-10-CM

## 2017-07-30 LAB — CBC
HEMATOCRIT: 34.1 % — AB (ref 39.0–52.0)
Hemoglobin: 11.2 g/dL — ABNORMAL LOW (ref 13.0–17.0)
MCH: 29.6 pg (ref 26.0–34.0)
MCHC: 32.8 g/dL (ref 30.0–36.0)
MCV: 90.2 fL (ref 78.0–100.0)
PLATELETS: 371 10*3/uL (ref 150–400)
RBC: 3.78 MIL/uL — ABNORMAL LOW (ref 4.22–5.81)
RDW: 16.8 % — AB (ref 11.5–15.5)
WBC: 10.7 10*3/uL — AB (ref 4.0–10.5)

## 2017-07-30 LAB — COMPREHENSIVE METABOLIC PANEL
ALT: 21 U/L (ref 17–63)
AST: 23 U/L (ref 15–41)
Albumin: 2.9 g/dL — ABNORMAL LOW (ref 3.5–5.0)
Alkaline Phosphatase: 87 U/L (ref 38–126)
Anion gap: 8 (ref 5–15)
BILIRUBIN TOTAL: 1.7 mg/dL — AB (ref 0.3–1.2)
CHLORIDE: 105 mmol/L (ref 101–111)
CO2: 23 mmol/L (ref 22–32)
CREATININE: 0.65 mg/dL (ref 0.61–1.24)
Calcium: 9 mg/dL (ref 8.9–10.3)
Glucose, Bld: 102 mg/dL — ABNORMAL HIGH (ref 65–99)
POTASSIUM: 4 mmol/L (ref 3.5–5.1)
Sodium: 136 mmol/L (ref 135–145)
TOTAL PROTEIN: 6.8 g/dL (ref 6.5–8.1)

## 2017-07-30 NOTE — Progress Notes (Signed)
Pt refuses to take his scheduled K-dur due to current normal potasium levels.

## 2017-07-30 NOTE — Progress Notes (Signed)
   Subjective:  Resting in bed, no changes overnight.  Objective:  Vital signs in last 24 hours: Vitals:   07/29/17 0553 07/29/17 1411 07/29/17 2036 07/30/17 0346  BP: 132/78 105/63 104/61 (!) 97/59  Pulse: (!) 103 89  82  Resp: 18 17 17 17   Temp: 97.8 F (36.6 C) 98.7 F (37.1 C) 98.3 F (36.8 C) 98.6 F (37 C)  TempSrc: Oral Oral Oral Oral  SpO2: 100% 99% 98% 98%  Weight:      Height:       Physical Exam: General: resting in bed, NAD Cardiac: RRR, no rubs, murmurs or gallops Pulm: clear to auscultation bilaterally, moving normal volumes of air Abd: nondistended Ext: right sternoclavicular surgical wound dressedl. PICC RUE. No pedal edema Neuro: alert and oriented X3   Assessment/Plan: 37 year old IV drug user with recurrent skin and soft infections.  He presented initially on 06/05/17 and found to have neck abscess, septic arthritis of the hip and MSSA bacteremia.  CT surgery, or the surgery, and infectious disease have been on board.  He has had several washouts of his septic joints.  Currently receiving IV antibiotics and remains in the hospital to finish his antibiotic course.   MSSA Bacteremia secondary to IVDA Originally started on vancomycin and Zosyn and has been de-escalated to IV nafcillin.  His TEE on 07/21/17 showed no vegetations.  A PICC line was placed but he is unable to be discharged home SNF as he has been suspected of tampering with his IV. Most recent blood cultures on 2/23 were negative. He remains afebrile last fever 2/25.  ID recommendations from 07/09/17: Last positive cultures on 1/18. 8 week course nafcillin 2 g IV every 4 hours with end date 3/16.  If inflammatory markers still elevated at that point can consider Keflex course. - Continue Nafcillin as scheduled through 3/16  Septic arthritis of sternoclavicular joint, chest wall and right neck abscess S/p several wash-outs. Had wound-vac temporarily which was removed 2/22. Stable.  Multifocal  pneumonia due to Septic Pulmonary Emboli: Resolved  OUD Seen by Dr. Oswaldo DoneVincent for possible treatment of OUD 2/12. Patient reportedly open to this follow-up.   Will reconsult Dr. Oswaldo DoneVincent tomorrow as patient approaches discharge.   Persistent Hypokalemia of unclear etiology: Has required aggressive replacement and is now on scheduled K-dur 40mEq TID. Magnesium has been consistently wnl.  - Stable, K 4.0  Dispo: Anticipated discharge after completion of antibiotics (3/16).   Darreld McleanPatel, Faisal Stradling, MD  Internal Medicine PGY-3 07/30/2017, 9:14 AM

## 2017-07-30 NOTE — Progress Notes (Addendum)
      [address]       [place] [id number]             [phone]      9 Days Post-Op Procedure(s) (LRB): TRANSESOPHAGEAL ECHOCARDIOGRAM (TEE) (N/A) Subjective: No issues overnight. Patient states that he is trying to sleep. Lights are off in the room.   Objective: Vital signs in last 24 hours: Temp:  [98.3 F (36.8 C)-98.7 F (37.1 C)] 98.6 F (37 C) (03/10 0346) Pulse Rate:  [82-89] 82 (03/10 0346) Resp:  [17] 17 (03/10 0346) BP: (97-105)/(59-63) 97/59 (03/10 0346) SpO2:  [98 %-99 %] 98 % (03/10 0346)     Intake/Output from previous day: 03/09 0701 - 03/10 0700 In: 1414 [P.O.:914; IV Piggyback:500] Out: -  Intake/Output this shift: Total I/O In: 220 [P.O.:220] Out: -   General appearance: alert, cooperative and no distress Heart: regular rate and rhythm, S1, S2 normal, no murmur, click, rub or gallop Lungs: clear to auscultation bilaterally Abdomen: soft, non-tender; bowel sounds normal; no masses,  no organomegaly Extremities: extremities normal, atraumatic, no cyanosis or edema Wound: packed wet to dry  Lab Results: Recent Labs    07/30/17 0345  WBC 10.7*  HGB 11.2*  HCT 34.1*  PLT 371   BMET:  Recent Labs    07/27/17 1928 07/30/17 0345  NA  --  136  K  --  4.0  CL  --  105  CO2  --  23  GLUCOSE  --  102*  BUN  --  <5*  CREATININE 0.78 0.65  CALCIUM  --  9.0    PT/INR: No results for input(s): LABPROT, INR in the last 72 hours. ABG    Component Value Date/Time   PHART 7.430 06/07/2017 0940   HCO3 27.5 06/07/2017 0940   TCO2 29 06/05/2017 1457   ACIDBASEDEF 4.0 (H) 03/13/2007 0120   O2SAT 90.2 06/07/2017 0940   CBG (last 3)  No results for input(s): GLUCAP in the last 72 hours.  Assessment/Plan: S/P Procedure(s) (LRB): TRANSESOPHAGEAL ECHOCARDIOGRAM (TEE) (N/A)  1. CV - SR 2.  ID-bacteremia due to MSSA (right sterno clavicular joint and left hip) secondary to IV drug use. On Naficillin (last dose 03/16). Dressing  changes to continue for right sterno clavicular joint wound. 3. Anemia- H and H increased to 11.2 and 34.1 4. Management per primary    LOS: 55 days    Sharlene Doryessa N Conte 07/30/2017    Patient examined, right sternoclavicular wound repacked and examined.  It now accepts a 2 x 2 wet-to-dry gauze and appears to be granulating in.  He will need continued daily dressing changes with packing upon discharge.  We will see the patient as outpatient in office but expect the wound to heal in  by early April  patient examined and medical record reviewed,agree with above note. [name] 07/31/2017  Is not

## 2017-07-31 ENCOUNTER — Inpatient Hospital Stay: Payer: Self-pay | Admitting: Infectious Diseases

## 2017-07-31 DIAGNOSIS — D72829 Elevated white blood cell count, unspecified: Secondary | ICD-10-CM

## 2017-07-31 NOTE — Progress Notes (Signed)
   Subjective: Clifford Tucker was seen sitting up in bed this morning doing well. He stated that his pain is being well controlled and he is comfortable.   Objective:  Vital signs in last 24 hours: Vitals:   07/30/17 0346 07/30/17 1358 07/30/17 2058 07/31/17 0452  BP: (!) 97/59 123/73 116/65 107/67  Pulse: 82 93 88 78  Resp: 17 18 16    Temp: 98.6 F (37 C) 98.6 F (37 C) 98.5 F (36.9 C) 98.2 F (36.8 C)  TempSrc: Oral Oral Oral Oral  SpO2: 98% 100% 100% 99%  Weight:      Height:       Physical Exam  Constitutional: He appears well-developed and well-nourished. No distress.  HENT:  Head: Normocephalic and atraumatic.  Eyes: Conjunctivae are normal.  Cardiovascular: Normal rate, regular rhythm and normal heart sounds.  Respiratory: Effort normal and breath sounds normal. No respiratory distress. He has no wheezes.  GI: Soft. Bowel sounds are normal. He exhibits no distension. There is no tenderness.  Musculoskeletal: He exhibits no edema.  Neurological: He is alert.  Skin: He is not diaphoretic.  Psychiatric: He has a normal mood and affect. His behavior is normal. Judgment and thought content normal.   Assessment/Plan:  37 year old IV drug user with neck abscess, septic arthritis of the hip and MSSA bacteremia s/p several washouts of his septic joints.  Currently receiving IV antibiotics and remains in the hospital to finish his antibiotic course.   MSSA Bacteremia secondary to IVDA Originally started on vancomycin and Zosyn and has been de-escalated to IV nafcillin. Will continue IV nafcillin till 08/05/17.  -Blood cultures remain negative -Will request ID to re-evaluate closer to discharge.   Septic arthritis of sternoclavicular joint, chest wall and right neck abscess S/p several wash-outs.  Stable.  -Cardiovasc surgery saw patient today and recommended 2x2 wet-to-dry dressing changes and follow up outpatient.   Multifocal pneumonia due to Septic Pulmonary  Emboli Resolved  OUD Seen byDr. Oswaldo Tucker for possibletreatment of OUD 2/12.   -Will consult Dr. Oswaldo Tucker regarding suboxone therapy  Persistent Hypokalemia of unclear etiology Has required aggressive replacement during hospitalization. Patient potassium is 4 today.  -continue K-dur 40mEq TID.    Dispo: Anticipated discharge in approximately 4-5 day(s).   Clifford Tucker, Clifford Shidler, MD 07/31/2017, 12:26 PM Pager: [phone]

## 2017-07-31 NOTE — Progress Notes (Signed)
Medicine attending: I examined this patient today together with resident physician Dr. Lorenso CourierVahini Chundi and I concur with her evaluation and management plan which we discussed together. He remained stable on parenteral antibiotics.  No acute change.  Plan to continue antibiotics through Friday, March 15. Lab profile repeated yesterday.  Everything is stable.  Mild leukocytosis white count 10,700 trending down compared with March 7 value.

## 2017-07-31 NOTE — Progress Notes (Signed)
Patient requesting sleeping pill at this time. Talked with the patient that its too early for sleeping pill but insisted to have the sleeping pill. Md made aware of the patient request and MD stated if he wants it then we cant do anything about it.Ambien 10 mg given.

## 2017-08-01 MED ORDER — BUPRENORPHINE HCL-NALOXONE HCL 2-0.5 MG SL SUBL
2.0000 | SUBLINGUAL_TABLET | Freq: Once | SUBLINGUAL | Status: AC
  Administered 2017-08-02: 2 via SUBLINGUAL
  Filled 2017-08-01: qty 2

## 2017-08-01 MED ORDER — CLONAZEPAM 1 MG PO TABS
1.0000 mg | ORAL_TABLET | Freq: Two times a day (BID) | ORAL | Status: DC | PRN
  Administered 2017-08-01: 1 mg via ORAL
  Filled 2017-08-01: qty 1

## 2017-08-01 NOTE — Progress Notes (Signed)
Patient continue to request sleeping med at this time,patient becomes anxious and agitated if patient tries to encourage to take the pill later tonight.

## 2017-08-01 NOTE — Progress Notes (Addendum)
    I reevaluated Clifford Tucker today for severe opioid use disorder.  He has been using oxycodone 60 mg a day in divided doses to manage his opioid dependence and acute pain generators from his multiple surgical wounds.  He is now approaching the end of this very long hospitalization and we are starting to plan ahead for office-based opioid therapy.  The major barrier to him doing Suboxone outside hospital is lack of insurance coverage.  Unfortunately I just discovered today that the pharmaceutical assistance program for Suboxone has been discontinued by the drug company.  This leaves our uninsured patients with very few resources and no other national or local assistance programs that I know.  I talked to him about  cost of paying cash for buprenorphine, which in my experience generally runs around $40 per week if one "shops around". 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  He also recognizes that he is at extremely high risk of relapse if he leaves the hospital without some sort of opioid use disorder treatment. He wants to try Suboxone and thinks he can make the out of pocket cost work.  Plan will be to discontinue oxycodone tonight after midnight dose.  Then start Suboxone 4 mg tomorrow at 10 AM.  If he does well we can re-dose this 4 mg about 2 hours later.  Our eventual goal will be to reach 8 mg twice daily.  He reports that a low-dose of clonazepam is been helpful for him in the past with managing withdrawal, he certainly has tremendous amount of anxiety around the prospect of going through any withdrawal type symptoms.  I think it will be fine to use clonazepam 1 mg twice daily as needed for anxiety during this transition.  I have written all the orders and will communicate with his primary team.

## 2017-08-01 NOTE — Progress Notes (Signed)
   Subjective: [name] was seen resting this morning. He states that he is doing well and he does not have any pain.   Objective:  Vital signs in last 24 hours: Vitals:   07/31/17 0452 07/31/17 1505 07/31/17 2013 08/01/17 0410  BP: 107/67 (!) 102/53 100/62 97/64  Pulse: 78 89 90 64  Resp:  16 16 15   Temp: 98.2 F (36.8 C) 98.3 F (36.8 C) 98.3 F (36.8 C) 98.1 F (36.7 C)  TempSrc: Oral Oral Oral Oral  SpO2: 99% 100% 100% 97%  Weight:      Height:       Physical Exam  Constitutional: He appears well-developed and well-nourished. No distress.  HENT:  Head: Normocephalic and atraumatic.  Eyes: Conjunctivae are normal.  Cardiovascular: Normal rate, regular rhythm and normal heart sounds.  Respiratory: Effort normal and breath sounds normal. No respiratory distress. He has no wheezes.  GI: Soft. Bowel sounds are normal. He exhibits no distension. There is no tenderness.  Musculoskeletal: He exhibits no edema.  Neurological: He is alert.  Skin: He is not diaphoretic. No erythema.  Psychiatric: He has a normal mood and affect. His behavior is normal. Judgment and thought content normal.   Assessment/Plan:  37 year old IV drug user with neck abscess, septic arthritis of the hip and MSSA bacteremia s/p several washouts of his septic joints. Currently receiving IV antibiotics and remains in the hospital to finish his antibiotic course.   MSSA Bacteremia secondary to IVDA OUD Originally started on vancomycin and Zosyn and has been de-escalated to IV nafcillin. Will continue IV nafcillin till 08/05/17.  -Blood cultures remain negative -Will request ID to re-evaluate closer to discharge.  -Requested Dr. Oswaldo DoneVincent to speak to patient about suboxone therapy.   Septic arthritis of sternoclavicular joint, chest wall and right neck abscess S/p several wash-outs. Stable.  Multifocal pneumonia due to Septic Pulmonary Emboli Resolved  Persistent Hypokalemia of unclear  etiology The patient's last potassium level was 4 yesterday. He has required aggressive replacement during hospitalization.  -continue K-dur 40mEq TID.   Dispo: Anticipated discharge in approximately 4-5 day(s).   [name], Adrinne Sze, MD 08/01/2017, 1:26 PM Pager: [phone]

## 2017-08-01 NOTE — Progress Notes (Signed)
Medicine attending: We appreciate vascular surgery follow-up.  Wound examined and repacked.  Recommendation for wet-to-dry dressings daily as wound continues to heal. I evaluated this patient today together with resident physician Dr. Lorenso CourierVahini Chundi and I concur with her evaluation and management plan which we discussed together. No acute change.  Plan course of antibiotics to be completed on Friday, March 15. He is interested in a Suboxone program.  He will get more information from Dr. Oswaldo DoneVincent later today.

## 2017-08-02 ENCOUNTER — Telehealth: Payer: Self-pay | Admitting: Surgery

## 2017-08-02 ENCOUNTER — Encounter: Payer: Self-pay | Admitting: Internal Medicine

## 2017-08-02 DIAGNOSIS — M71152 Other infective bursitis, left hip: Secondary | ICD-10-CM

## 2017-08-02 DIAGNOSIS — A4101 Sepsis due to Methicillin susceptible Staphylococcus aureus: Principal | ICD-10-CM

## 2017-08-02 DIAGNOSIS — F1123 Opioid dependence with withdrawal: Secondary | ICD-10-CM

## 2017-08-02 DIAGNOSIS — M6008 Infective myositis, other site: Secondary | ICD-10-CM

## 2017-08-02 MED ORDER — CLONAZEPAM 1 MG PO TABS: 1.0000 mg | ORAL_TABLET | ORAL | 0 refills | Status: DC | PRN

## 2017-08-02 MED ORDER — BUPRENORPHINE HCL-NALOXONE HCL 8-2 MG SL SUBL: 1.0000 | SUBLINGUAL_TABLET | Freq: Two times a day (BID) | SUBLINGUAL | 0 refills | Status: DC

## 2017-08-02 MED ORDER — CLONAZEPAM 1 MG PO TABS: 2.0000 mg | ORAL_TABLET | Freq: Two times a day (BID) | ORAL | Status: DC | PRN

## 2017-08-02 MED ORDER — CLONAZEPAM 1 MG PO TABS
2.0000 mg | ORAL_TABLET | Freq: Once | ORAL | Status: AC
  Administered 2017-08-02: 2 mg via ORAL
  Filled 2017-08-02: qty 2

## 2017-08-02 MED ORDER — BUPRENORPHINE HCL-NALOXONE HCL 2-0.5 MG SL SUBL
2.0000 | SUBLINGUAL_TABLET | Freq: Once | SUBLINGUAL | Status: AC
  Administered 2017-08-02: 2 via SUBLINGUAL
  Filled 2017-08-02: qty 2

## 2017-08-02 MED ORDER — LOPERAMIDE HCL 2 MG PO CAPS: 2.0000 mg | ORAL_CAPSULE | ORAL | Status: DC | PRN

## 2017-08-02 MED ORDER — LORAZEPAM 1 MG PO TABS: 1.0000 mg | ORAL_TABLET | Freq: Four times a day (QID) | ORAL | Status: DC | PRN

## 2017-08-02 NOTE — Discharge Instructions (Signed)
It was a pleasure to take care of you [name]:  -Please monitor your symptoms for possible withdrawal and you can use klonipin as needed.  -Please follow up with Dr. Oswaldo DoneVincent next Tuesday at 8:15am -Please schedule follow up with Infectious disease doctors within a week  Wet to dry dressing (using 4x4's) to be applied to left sterno clavicular open wound. Change daily

## 2017-08-02 NOTE — Progress Notes (Signed)
Pt is asking questions regarding the his klonopin. Per pt, he was told that he was going to get 2mg  of klonopin twice a day but the order is 1mg  per dose twice a day. Pt appears very anxious this morning. Made on call aware. 2 mg of klonopin x1 ordered. Will continue to monitor pt.

## 2017-08-02 NOTE — Progress Notes (Signed)
The patient may have had a previous prescription of Norco 5-325mg  that was initially noted to be resumed on AVS, but the patient was called and told to not use norco if he had any prior. No new norco prescription was filled and norco was discontinued on discharge orders.  The patient was also called with information regarding [facility] and given phone number of [phone].

## 2017-08-02 NOTE — Progress Notes (Signed)
   Subjective: [name] was resting comfortably in his bed this morning. He states that he is anxious about going into withdrawal while starting suboxone therapy. The patient denies pain. He stated that he had to change his shirt overnight as he was sweating.   Objective:  Vital signs in last 24 hours: Vitals:   08/01/17 0410 08/01/17 1338 08/01/17 2100 08/02/17 0422  BP: 97/64 (!) 93/54 (!) 105/57 (!) 93/55  Pulse: 64 66 87 100  Resp: 15  15 16   Temp: 98.1 F (36.7 C)  98.5 F (36.9 C) 98.5 F (36.9 C)  TempSrc: Oral  Oral Oral  SpO2: 97% 98% 95% 100%  Weight:      Height:       Physical Exam  Constitutional: He appears well-developed and well-nourished. No distress.  HENT:  Head: Normocephalic and atraumatic.  Eyes: Conjunctivae are normal.  Cardiovascular: Normal rate, regular rhythm and normal heart sounds.  Respiratory: Effort normal and breath sounds normal. No respiratory distress. He has no wheezes.  GI: Soft. Bowel sounds are normal. He exhibits no distension. There is no tenderness.  Musculoskeletal: He exhibits no edema.  Neurological: He is alert.  Skin: He is not diaphoretic. No erythema.  Psychiatric: His behavior is normal. His mood appears anxious.    Assessment/Plan:  37 year old IV drug user with neck abscess, septic arthritis of the hip and MSSA bacteremias/pseveral washouts of his septic joints. Currently receiving IV antibiotics and remains in the hospital to finish his antibiotic course.   MSSA Bacteremia secondary to IVDA The patient is doing well on IV nafcillin which will be continued till 08/05/17. He is afebrile and does not have leukocytosis.  -Blood cultures remain negative -Will request ID to re-evaluate closer to discharge. -Requested Dr. Oswaldo DoneVincent to speak to patient about suboxone therapy.   OUD The patient is being started on suboxone therapy under Dr. Jacqualin CombesVincent's guidance. The patient was instructed to stop all opiates at midnight on  08/01/17. He was given 4mg  suboxone this morning at 10am. The patient was noted to be calm and without any significant withdrawal symptoms.   -Dr. Oswaldo DoneVincent will re-evaluate patient and increase dosign of suboxone as appropriate.  -Klonopin 1mg  bid   Septic arthritis of sternoclavicular joint, chest wall and right neck abscess S/p several wash-outs. Stable.  Multifocal pneumonia due to Septic Pulmonary Emboli Resolved  Persistent Hypokalemia of unclear etiology The patient's last potassium level was 4 yesterday. He has required aggressive replacementduring hospitalization.  -continueK-dur 40mEq TID.  Dispo: Anticipated discharge in approximately 3-4 day(s).   Clifford Tucker, Clifford Burditt, MD 08/02/2017, 10:04 AM Pager: [phone]

## 2017-08-02 NOTE — Progress Notes (Signed)
    I re-evaluated Clifford Tucker this morning following initiation of Bupe for severe opioid use disorder. Last dose of oxycodone was [place], he started feeling withdrawal symptoms around 6 am. He got first dose of Bupe 4mg  at 10 am, and I saw him an hour later. He says he is now feeling great. He is clean shaven, wearing normal clothes, up and walking around his room. He says the oxycodone was keeping him feeling the brink of withdrawal, but the Bupe is much more helpful with those symptoms. He wishes he had started a while ago. I have written for a second dose of Bupe 4mg  now. He tells me he wants to leave today, 2 days early from scheduled, so I gave him a paper prescription for generic Bupe-naloxone 8-2mg , take 1 tab SL BID, #14 for a one week supply. He [facility] next Tuesday [facility]. The remainder of discharge planning will be from the excellent house staff.

## 2017-08-02 NOTE — Care Management Note (Addendum)
Case Management Note  Patient Details  Name: Clifford Tucker MRN: [id number] Date of Birth: 09-27-1980  Subjective/Objective:                    Action/Plan: Patient called department [facility] and [facility] because prescription is for 7 days and MATCH coverage is up to 14 days . Spoke with Belenda CruiseKristin at [facility] on [address] [phone], who states patient did not show them a MATCH letter , just a coupon that read 34 day supply . [facility]ained [facility] and [facility] will [facility] 14 days or less. [facility] not [facility] . Will call patient , Called patient . [facility]. Patient states MATCH states it will cover up to 34 day supply . Explained again to patient that for controlled substance MATCH covers 14 days or less. Patient again states letter saids 34 days , so he needs prescription for 34 days. Again explained MATCH will cover 7 day prescription. Patient voiced understanding and has CM phone number.   Spoke to [facility] regarding [employer] , was given permission to enter over ride for [employer] and Klonopin, same done. Explained and provided MATCH letter to patient and his mother at bedside . Both voiced understanding. Expected Discharge Date:  08/02/17               Expected Discharge Plan:  Home/Self Care  In-House Referral:  Clinical Social Work  Discharge planning Services  CM Consult, Medication Assistance, MATCH Program, Indigent Health Clinic  Post Acute Care Choice:  NA Choice offered to:  Patient, Parent  DME Arranged:  N/A DME Agency:  NA  HH Arranged:  NA HH Agency:  NA  Status of Service:  Completed, signed off  If discussed at Long Length of Stay Meetings, dates discussed:    Additional Comments:  [name], Rudine Rieger Marie, RN 08/02/2017, 12:41 PM

## 2017-08-02 NOTE — Telephone Encounter (Signed)
ED CM received call from Unit RN concerning patient [facility] [address] ED CM contacted Pharmacist who states suboxone was denied, checked [employer] resubmitted claim for the second time claim was accepted. Patient contacted and updated.

## 2017-08-02 NOTE — Discharge Summary (Signed)
Name: Clifford Tucker MRN: [id number] DOB: 1980-09-07 37 y.o. PCP: Patient, No Pcp Per  Date of Admission: 06/05/2017  2:40 PM Date of Discharge: 08/02/2017 Attending Physician: Dr. 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   buprenorphine-naloxone 8-2 mg Subl SL tablet Commonly known as:  SUBOXONE Place 1 tablet under the tongue 2 (two) times daily.   clonazePAM 1 MG tablet Commonly known as:  KLONOPIN Take 1 tablet (1 mg total) by mouth as needed for up to 5 days for anxiety.   ibuprofen 200 MG tablet Commonly known as:  ADVIL,MOTRIN Take 200-400 mg by mouth every 6 (six) hours as needed for mild pain.       Disposition and follow-up:   Clifford Tucker was discharged from [facility] in stable condition.  At the hospital follow up visit please address:  1.  MSSA Bacteremia: the patient should be evaluated for fever, leukocytosis, localized pain. He should follow with infectious disease. Inflammatory markers should be considered.   OUD: Please make sure the patient does not have withdrawal symptoms. Please make sure that he has not been using any opiates. He should follow with Dr. Evette Doffing in [place] clinic.   2.  Labs / imaging  needed at time of follow-up: ESR/CRP, CBC, BMP  3.  Pending labs/ test needing follow-up: none  Follow-up Appointments: Follow-up [place] for Infectious Disease. Call.   Specialty:  Infectious Diseases Why:  Week of March 11th with Dr. Linus Salmons or [facility] information: [place], [place] [id number] [place]       Ivin Poot, MD. Go on 08/16/2017.   Specialty:  Cardiothoracic Surgery Why:  PA/LAT CXR to be taken prior to office appointment (which is in the same building as Dr. Prescott Gum' s office) on 08/16/2017 at 11:00 am;Appointment time is at 11:30 am Contact information: [place] [address] [phone]        Axel Filler, MD Follow up on 08/08/2017.   Specialty:  Internal Medicine Why:  8:15am Contact information: [address] [place] [address] [place] [facility] Course by problem list:  MSSA Bacteremia secondary to IVDA The patient was admitted on 06/05/17 with right neck pain, left hip pain, and shortness of breath. Blood culture collected 06/05/17 returned positive for staph aureus. His TEE on 07/21/17 showed no vegetations.  The patient was originally started on vancomycin and Zosyn and has been de-escalated to IV nafcillin.   ID recommended an 8 week course of nafcillin q4hrs till 08/05/17 and  if inflammatory markers are still elevated after than then to transition to keflex. A PICC line was placed but he is unable to be discharged home SNF as he has been suspected of tampering with his IV and been smoking in the bathroom. The patient's blood cultures since 2/23 have been negative. He has also been afebrile since 2/25. TEE was done 07/21/17 which did not show any presence of vegetations.   On March 13th after receiving his first dose of suboxone, the patient requested to be discharged a few days  earlier. Infectious disease was consulted and they stated that it would be alright if the patient's IV nafcillin was stopped on 3/13 instead the intended 3/16 stop date. The patient was instructed to follow with ID outpatient.   Septic arthritis of sternoclavicular joint, chest wall and right neck abscess CT chest done 06/05/17 showed a large peripherally enhancing mass with central fluid and gas within the right lower neck extending from right sternoclavicular location along retrosternal soft tissues. He got irrigation and debridement of right sternoclavicular joint with pulse lavage done on 06/07/17 and 06/16/17. There was wound-vac placed which was removed 2/22.   Left septic bursitis and left hip myositis MR hip done 06/06/17 showed left hip septic arthritis, left ilipsoas septic bursitis, and left iliacus muscle. The patient got debridement and drainage of his left hip infection on 06/06/17.  Multifocal pneumonia due to Septic Pulmonary Emboli CT chest showed right lower lobe consolidation with right upper lobe ground glass opacities.   OUD The patient is an patient who uses intravenous drugs who is highly motivated to quit using. Unfortunately, the pharmaceutical assistance program for suboxone has been discontinued by drug companies. However, the patient stated that although he has a financial barrier he will find money to pay for it. He was started on suboxone on day of discharge (3/13) and was did well. He was closely monitored for withdrawal symptoms and did not need any intervention. He was discharged with script for suboxone and instructed to follow up in [place] clinic within a week.   Persistent Hypokalemia of unclear etiology The patient needed aggressive potassium replacement throughout hospitalization. He was repleted on as needed basis. Magnesium was consistently wnl.   Discharge Vitals:   BP (!) 93/55 (BP Location: Left Arm)   Pulse 100   Temp 98.5 F (36.9 C) (Oral)   Resp 16    Ht '5\' 10"'  (1.778 m)   Wt 155 lb 3.3 oz (70.4 kg)   SpO2 100%   BMI 22.27 kg/m   Pertinent Labs, Studies, and Procedures:  CBC Latest Ref Rng & Units 07/30/2017 07/27/2017 07/26/2017  WBC 4.0 - 10.5 K/uL 10.7(H) 11.9(H) 8.3  Hemoglobin 13.0 - 17.0 g/dL 11.2(L) 10.8(L) 8.8(L)  Hematocrit 39.0 - 52.0 % 34.1(L) 33.6(L) 27.8(L)  Platelets 150 - 400 K/uL 371 454(H) 326   BMP Latest Ref Rng & Units 07/30/2017 07/27/2017 07/26/2017  Glucose 65 - 99 mg/dL 102(H) - 94  BUN 6 - 20 mg/dL <5(L) - <5(L)  Creatinine 0.61 - 1.24 mg/dL 0.65 0.78 0.62  Sodium 135 - 145 mmol/L 136 - 139  Potassium 3.5 - 5.1 mmol/L 4.0 - 3.9  Chloride 101 - 111 mmol/L 105 - 106  CO2 22 - 32 mmol/L 23 - 25  Calcium 8.9 - 10.3 mg/dL 9.0 - 8.8(L)   Left iliacus wound culture: no growth per final report 06/15/17  Respiratory culture (collected 06/07/17): no organisms seen, moderate candida tropicalis  Joint culture (06/06/17): no organisms  seen  CT Chest (06/05/17): There is a large peripherally enhancing mass with central fluid and gas within the right lower neck extending from the anterior aspect of the right sternoclavicular location posteriorly along the retrosternal soft tissues. Findings are concerning for abscess. No definite osseous destruction at this time of the right sternoclavicular joint or adjacent costochondral joint however secondary infection of these joints is not excluded given the size of the suspected abscess. 2. Consolidation within the right lower lobe with right upper lobe ground-glass opacities favored to represent multifocal pneumonia. 3. Mediastinal and right hilar adenopathy likely reactive in Etiology.  CT soft tissue neck (06/05/17): Gas and fluid collections in the anterior upper chest as reported on separate chest CT, concerning for abscesses. Phlegmon and less organized gas and fluid extend superiorly in the anterior right neck with suspected myositis of the right sternocleidomastoid  muscle.  MR left hip (06/06/17): 1. Findings consistent with left hip septic arthritis and left iliopsoas septic bursitis. The left iliopsoas bursa extends into the left iliacus muscle, which demonstrates prominent muscle edema and enhancement, consistent with infectious myositis.   Discharge Instructions: Discharge Instructions    Call MD for:  persistant dizziness or light-headedness   Complete by:  As directed    Call MD for:  redness, tenderness, or signs of infection (pain, swelling, redness, odor or green/yellow discharge around incision site)   Complete by:  As directed    Call MD for:  severe uncontrolled pain   Complete by:  As directed    Call MD for:  temperature >100.4   Complete by:  As directed    Diet - low sodium heart healthy   Complete by:  As directed    Increase activity slowly   Complete by:  As directed       Signed: Lars Mage, MD 08/02/2017, 3:05 PM   Pager: [phone]

## 2017-08-02 NOTE — Progress Notes (Signed)
Medicine attending: I examined this patient today together with resident physician Dr. Lorenso CourierVahini Chundi and I concur with her evaluation and management plan which we discussed together. We greatly appreciate input from Dr. Oswaldo DoneVincent.  Patient has agreed to start Suboxone in the hospital.  Appropriate medication to minimize any withdrawal symptoms prescribed with Klonopin 2 mg twice daily as needed.  We will supplement with as needed lorazepam. Patient currently calm, not tremulous, pulse 100, chronic low blood pressure but he is awake, alert, and oriented.  Pupils are midposition round and reactive.  No diaphoresis. Impression: 1.  Methicillin sensitive staphylococcal sepsis in an IV drug abuser with dissemination to soft tissue and sternoclavicular joint.  Stable on nafcillin with plan completion of course March 15. 2.  Supervise drug withdrawal While in the hospital he has been receiving approximately 50-60 mg of oxycodone daily.  He will now be transitioned to Suboxone.  Patient is motivated to become sober.

## 2017-08-02 NOTE — Progress Notes (Signed)
Patient called regarding his Match letter not covering his med. CM on call made aware and per CM  She will call the pharmacy and  Will call patient., gaved patient phone number to CM.

## 2017-08-08 ENCOUNTER — Ambulatory Visit (INDEPENDENT_AMBULATORY_CARE_PROVIDER_SITE_OTHER): Payer: Self-pay | Admitting: Student in an Organized Health Care Education/Training Program

## 2017-08-08 VITALS — BP 103/66 | HR 77 | Temp 97.6°F | Ht 71.0 in | Wt 144.7 lb

## 2017-08-08 DIAGNOSIS — F1190 Opioid use, unspecified, uncomplicated: Secondary | ICD-10-CM

## 2017-08-08 DIAGNOSIS — F329 Major depressive disorder, single episode, unspecified: Secondary | ICD-10-CM

## 2017-08-08 DIAGNOSIS — F1120 Opioid dependence, uncomplicated: Secondary | ICD-10-CM

## 2017-08-08 DIAGNOSIS — F1199 Opioid use, unspecified with unspecified opioid-induced disorder: Secondary | ICD-10-CM | POA: Insufficient documentation

## 2017-08-08 DIAGNOSIS — Z8619 Personal history of other infectious and parasitic diseases: Secondary | ICD-10-CM

## 2017-08-08 DIAGNOSIS — Z8739 Personal history of other diseases of the musculoskeletal system and connective tissue: Secondary | ICD-10-CM

## 2017-08-08 DIAGNOSIS — F419 Anxiety disorder, unspecified: Secondary | ICD-10-CM

## 2017-08-08 HISTORY — DX: Opioid use, unspecified, uncomplicated: F11.90

## 2017-08-08 MED ORDER — BUPRENORPHINE HCL-NALOXONE HCL 8-2 MG SL SUBL: 1.0000 | SUBLINGUAL_TABLET | Freq: Two times a day (BID) | SUBLINGUAL | 0 refills | Status: DC

## 2017-08-08 NOTE — Assessment & Plan Note (Signed)
Severe opioid use disorder with injection heroin use and then complicated by staph aureus bacteremia with sternoclavicular septic arthritis amongst other complications.  He was just discharged from the hospital middle of last week and has done very well at home.  Reports good compliance with Suboxone, current dose is managing withdrawal and craving symptoms.  He reports he is in remission with no recent relapses.  Plan is to continue with generic buprenorphine-naloxone 8 mg tablets, 2 daily. I gave him a paper prescription today so he can try to find the most affordable pharmacy to fill it in [place].  We may be able to send future prescriptions to the [facility] outpatient pharmacy for 25$/week, this program is currently being piloted.  Urine drug test today, I reviewed the controlled substance reporting system which was appropriate.

## 2017-08-08 NOTE — Assessment & Plan Note (Signed)
Anxiety and depression symptoms are much improved since hospital discharge.  I do not think he has any further need for prescription clonazepam and I have discontinued this.  We will continue to monitor this though at subsequent visits.

## 2017-08-08 NOTE — Progress Notes (Signed)
   08/08/2017  Clifford Tucker presents for follow up of opioid use disorder I have reviewed the prior induction visit, follow up visits, and telephone encounters relevant to opiate use disorder (OUD) treatment.   Current daily dose: Generic bupe-naloxone '16mg'$  daily  Date of Induction: 07/05/17  Current follow up interval, in weeks: 45   HPI: 37 year old man here for follow-up of severe opioid use disorder.  I first met the patient's back in early February when he was admitted with staph aureus bacteremia which caused a slurry of complications including a neck skin and soft tissue abscess which tracked down to a sternoclavicular septic arthritis, and later caused a hip septic arthritis.  He was managed with several surgical debridements and prolonged IV antibiotics, he was in the hospital for 2 months.  He reports doing very well since being discharged, he greatly appreciates being back home with his fiance and daughter.  He says he is building back up his strength and he wants to start getting back to work soon.  He reports good compliance with buprenorphine, 8 mg twice daily.  Denies any relapses since discharge.  Reports that his cravings are well controlled.  He has been on buprenorphine previously, but unfortunately lost access to the medication and had a relapse to injection heroin use for about 3 weeks when he became sick in mid January.  He understands that further relapses could cause fatal complications.  He is committed to maintaining treatment.  One big barrier treatment is the cost of the medication.  Unfortunately the patient has no insurance coverage.  There is no further pharmaceutical assistance with Suboxone.  He was able to get a 1 week supply of Suboxone covered through the [facility] program, but he does not think that that will be an ongoing coverage.[facility] told him a one week supply of generic bupe would cost over $100, he is going to shop around for a better deal this week.   Exam:    Vitals:   08/08/17 0840  BP: 103/66  Pulse: 77  Temp: 97.6 F (36.4 C)  TempSrc: Oral  SpO2: 100%  Weight: 144 lb 11.2 oz (65.6 kg)  Height: '5\' 11"'$  (1.803 m)    General: Well-appearing young man in no distress Skin: Right sternoclavicular wound with small skin breakdown and small amount of erythema, minimal drainage, covered with a clean bandage Heart: Regular rate and rhythm with no murmurs Extremities: Warm well perfused with no lower extremity edema.  Assessment/Plan:  See Problem Based Charting in the Encounters Tab     Axel Filler, MD  08/08/2017  9:24 AM

## 2017-08-13 LAB — TOXASSURE SELECT,+ANTIDEPR,UR

## 2017-08-16 ENCOUNTER — Ambulatory Visit: Payer: Self-pay | Admitting: Cardiothoracic Surgery

## 2017-08-16 ENCOUNTER — Other Ambulatory Visit: Payer: Self-pay

## 2017-08-16 ENCOUNTER — Ambulatory Visit (INDEPENDENT_AMBULATORY_CARE_PROVIDER_SITE_OTHER): Payer: Self-pay | Admitting: Student in an Organized Health Care Education/Training Program

## 2017-08-16 VITALS — BP 126/79 | HR 94 | Temp 98.2°F | Ht 71.0 in | Wt 142.8 lb

## 2017-08-16 DIAGNOSIS — G4700 Insomnia, unspecified: Secondary | ICD-10-CM | POA: Insufficient documentation

## 2017-08-16 DIAGNOSIS — F1190 Opioid use, unspecified, uncomplicated: Secondary | ICD-10-CM

## 2017-08-16 DIAGNOSIS — F1199 Opioid use, unspecified with unspecified opioid-induced disorder: Secondary | ICD-10-CM

## 2017-08-16 MED ORDER — BUPRENORPHINE HCL-NALOXONE HCL 8-2 MG SL SUBL: 1.0000 | SUBLINGUAL_TABLET | Freq: Two times a day (BID) | SUBLINGUAL | 0 refills | Status: DC

## 2017-08-16 NOTE — Assessment & Plan Note (Signed)
Severe opioid use disorder with recent complicated hospital stay with septic arthritis of the right sternoclavicular joint and other large joints, doing well at home on treatment with buprenorphine 16mg  daily.  No relapses since discharge.  Last urine drug testing was normal.  Reviewed controlled substance reporting system was normal.  Patient is uninsured, he is paying for generic buprenorphine with cash, found that the [facility] pharmacy offers it for $47 per week, with coupon.  He is actively looking for a job which might need we will have to see him in the afternoons.  Plan to continue with Bupe 16mg  daily, I gave him a 1 week supply.  Follow-up in next Tuesday morning.

## 2017-08-16 NOTE — Assessment & Plan Note (Signed)
Insomnia seems secondary to recent hospitalization, illness, and initiating treatment for opioid use disorder.  Might be a mild side effect of buprenorphine.  I advised against restarting Ambien.  Talked about sleep hygiene.  I think once he has a regular job and a more routine schedule this will resolv.

## 2017-08-16 NOTE — Progress Notes (Signed)
   08/16/2017  Clifford Tucker presents for follow up of opioid use disorder I have reviewed the prior induction visit, follow up visits, and telephone encounters relevant to opiate use disorder (OUD) treatment.   Current daily dose: Generic Bupe-Naloxone 16mg  daily  Date of Induction: 07/05/17  Current follow up interval, in weeks: 1  The patient has been adherent with the buprenorphine for OUD contract.   Last UDS Result: Appropriate  HPI: 37 year old man here for follow-up for severe opioid use disorder.  Discharge from the hospital 2 weeks ago and has been doing well at home.  He missed his appointments with our clinic yesterday morning, said he had one day of work, is actively looking for more permanent job.  He is enjoying being back home with his fiance and his daughter.  He denied any relapses over the last 2 weeks.  Reports good compliance with with no side effects of Bupe.  He does complain of moderate insomnia, about 2 hours and then awakens, feeling restless.  Has been taking over the counters, was taking Ambien during his long hospitalization.  Exam:   Vitals:   08/16/17 1414  BP: 126/79  Pulse: 94  Temp: 98.2 F (36.8 C)  TempSrc: Oral  SpO2: 100%  Weight: 142 lb 12.8 oz (64.8 kg)  Height: 5\' 11"  (1.803 m)    General: Well-appearing young man, sitting up in chair in no distress Neck: No thyromegaly, no lymphadenopathy, right sternoclavicular joint is bandaged with minimal erythema or drainage Lungs: Breathing comfortably, no crackles or wheezes Extremity: Warm well perfused with no edema Neuro: Alert and oriented, conversational, normal upper and lower extremity strength, normal gait  Assessment/Plan:  See Problem Based Charting in the Encounters Tab    Clifford Tucker, Clifford Thomas, MD  08/16/2017  2:48 PM

## 2017-08-17 ENCOUNTER — Other Ambulatory Visit: Payer: Self-pay | Admitting: General Practice

## 2017-08-17 NOTE — Telephone Encounter (Signed)
Patient is calling his medicine was [facility] they doesn't carry his medicine he needs it sent somewhere else

## 2017-08-17 NOTE — Telephone Encounter (Signed)
Patient is calling back  °

## 2017-08-18 MED ORDER — BUPRENORPHINE HCL-NALOXONE HCL 8-2 MG SL SUBL: 1.0000 | SUBLINGUAL_TABLET | Freq: Two times a day (BID) | SUBLINGUAL | 0 refills | Status: DC

## 2017-08-18 NOTE — Telephone Encounter (Signed)
In these kinds of cases, it is important to let me know where he would like me to send the prescription.

## 2017-08-24 ENCOUNTER — Telehealth: Payer: Self-pay | Admitting: *Deleted

## 2017-08-24 NOTE — Telephone Encounter (Signed)
Pt did not stop at front desk after last appt to make future appt, he thought he had one this week and forgot to come. He calls today stating he runs out of med today, needs refill to [facility] [address], appt made for tues 4/9

## 2017-08-29 ENCOUNTER — Telehealth: Payer: Self-pay | Admitting: *Deleted

## 2017-08-29 NOTE — Telephone Encounter (Signed)
Called pt's ph and his wife answered, stated he forgot his ph, ask to have him call asap and ask for the OUD nurse

## 2017-11-06 ENCOUNTER — Telehealth: Payer: Self-pay | Admitting: General Practice

## 2017-11-06 NOTE — Telephone Encounter (Signed)
Patient is wanting to get back into the OUD program, please call him at [phone], if he doesn't answer please leave a message

## 2017-11-06 NOTE — Telephone Encounter (Signed)
Spoke w/ dr Criselda Peachesmullen, scheduled for 1045 in am OUD

## 2017-11-06 NOTE — Telephone Encounter (Signed)
I spoke with him this morning. He is high risk for complications and we should get him back in for another try at treatment when possible. Thanks.

## 2017-11-07 ENCOUNTER — Ambulatory Visit (INDEPENDENT_AMBULATORY_CARE_PROVIDER_SITE_OTHER): Payer: Self-pay | Admitting: Internal Medicine

## 2017-11-07 ENCOUNTER — Encounter: Payer: Self-pay | Admitting: Internal Medicine

## 2017-11-07 DIAGNOSIS — F1190 Opioid use, unspecified, uncomplicated: Secondary | ICD-10-CM

## 2017-11-07 DIAGNOSIS — Z8619 Personal history of other infectious and parasitic diseases: Secondary | ICD-10-CM

## 2017-11-07 DIAGNOSIS — F1120 Opioid dependence, uncomplicated: Secondary | ICD-10-CM

## 2017-11-07 DIAGNOSIS — F1199 Opioid use, unspecified with unspecified opioid-induced disorder: Secondary | ICD-10-CM

## 2017-11-07 MED ORDER — BUPRENORPHINE HCL-NALOXONE HCL 8-2 MG SL SUBL: 1.0000 | SUBLINGUAL_TABLET | Freq: Two times a day (BID) | SUBLINGUAL | 0 refills | Status: DC

## 2017-11-07 MED FILL — BUPRENORPHINE HCL-NALOXONE: 8-2 | 7 days supply | Qty: 14 | Fill #0

## 2017-11-07 NOTE — Progress Notes (Signed)
   11/07/2017  Clifford Tucker presents for follow up of opioid use disorder I have reviewed the prior induction visit, follow up visits, and telephone encounters relevant to opiate use disorder (OUD) treatment.   Current daily dose: None  Date of Induction: Initially induced 07/05/17 - in relapse  Current follow up interval, in weeks: 1  The patient has not been adherent with the buprenorphine for OUD contract.   Last UDS Result: not applicable  HPI: [name] is a 37yo man with severe opioid use disorder who presents to re-establish care for OUD management.  He has been in relapse since his last visit 08/16/17.  He reports his reason was just poor decision making.  He has done well on suboxone and would like to start back so that he can be able to work.  He has had complications from his substance use including abscesses and septic arthritis most recently.  He reports no recent complications related to his substance use.  He last used heroin yesterday and is starting to develop mild withdrawal.  He has had trouble affording suboxone in the past.  He occasionally uses marijuana, but reports no other substances which he uses regularly.  He denies any recent chest pain, fever, chills, nausea, vomiting, or any other symptoms.  We discussed the process of home induction which he was comfortable with.  We discussed safe storage of his suboxone.    Exam:   There were no vitals filed for this visit.  General: Awake alert, flat affect Eyes: Anicteric sclerae, no conjunctival injection HENT: He has enlarged [place] joint on the right with healing wound medially.  Non tender to palpation, no fluctuance CV: RR, NR, no murmur Pulm: Breathing comfortably, no wheezing.  Assessment/Plan:  See Problem Based Charting in the Encounters Tab     Inez CatalinaMullen, Emily B, MD  11/07/2017  4:20 PM

## 2017-11-07 NOTE — Assessment & Plan Note (Addendum)
Discussed option to restart suboxone today and [name] is amenable.  He reports that his cravings were well controlled with BID dosing previously.  He is willing to come to clinic once a week until he is stabilized off of heroin and on a stable dose of suboxone.  He reports recent use of heroin, so no Utox was done today.    Plan Buprenorphine-naloxone 8mg -2mg  BID, [facility] pharmacy for $25 which he stated that he could afford Return to clinic in 1 week We discussed the nature of addiction and relapse and I advised him to call the clinic if he was not able to make appointments or was having difficulty Also discussed home induction and he is aware of need to be in moderate withdrawal  COWS 5 currently, which would be mild withdrawal.  I advised him to take first dose of buprenorphine this evening.

## 2017-11-07 NOTE — Telephone Encounter (Signed)
Seen in clinic

## 2017-11-07 NOTE — Patient Instructions (Signed)
Mr. Lissa HoardBoone - -  Thank you for coming in to see us.  Please pick up your buprenorphine-naloxone at the [facility].   Please make an appointment to come in to see us in 1 week.

## 2017-11-14 ENCOUNTER — Ambulatory Visit (INDEPENDENT_AMBULATORY_CARE_PROVIDER_SITE_OTHER): Payer: Self-pay | Admitting: Internal Medicine

## 2017-11-14 VITALS — BP 96/55 | HR 73 | Temp 98.5°F | Wt 147.8 lb

## 2017-11-14 DIAGNOSIS — F1120 Opioid dependence, uncomplicated: Secondary | ICD-10-CM

## 2017-11-14 DIAGNOSIS — F1199 Opioid use, unspecified with unspecified opioid-induced disorder: Secondary | ICD-10-CM

## 2017-11-14 DIAGNOSIS — F1190 Opioid use, unspecified, uncomplicated: Secondary | ICD-10-CM

## 2017-11-14 DIAGNOSIS — R454 Irritability and anger: Secondary | ICD-10-CM

## 2017-11-14 MED ORDER — BUPRENORPHINE HCL-NALOXONE HCL 8-2 MG SL SUBL: 1.0000 | SUBLINGUAL_TABLET | Freq: Two times a day (BID) | SUBLINGUAL | 0 refills | Status: AC

## 2017-11-14 MED FILL — BUPRENORPHINE HCL-NALOXONE: 8-2 | 7 days supply | Qty: 14 | Fill #0

## 2017-11-15 NOTE — Assessment & Plan Note (Signed)
I discussed with him options about transitioning back to films from tablets however cost would be more expensive.  He does not think he can afford this at this time.  He will stick with the tablet formulation.  I did discuss we could consider trying to go up on the dose slightly however he wants to continue at his current dose. I will have him continue Suboxone 8-2 mg tablets 1 tablet twice a day I have provided him with 2, 1 week prescriptions, and will have him follow-up in our clinic in 2 weeks.

## 2017-11-15 NOTE — Progress Notes (Signed)
   11/15/2017  Clifford Tucker presents for follow up of opioid use disorder I have reviewed the prior induction visit, follow up visits, and telephone encounters relevant to opiate use disorder (OUD) treatment.   Current daily dose: None  Date of Induction: Initially induced 07/05/17 - in relapse  Current follow up interval, in weeks: 1  The patient has been adherent with the buprenorphine for OUD contract.   Last UDS Result: not applicable  HPI: Clifford Tucker is a 37yo man with severe opioid use disorder who presents for OUD management.   Last week he presented to our clinic to reestablish care.  He notes that he is restarted Suboxone with the 8 mg - 2 mg tablets.  He expresses frustration that he feels the tablets are not as effective as they films previously wore when he was previously on Suboxone therapy few months ago.  He notes that the day after his visit he started the Suboxone therapy induction went well however he is continued to have cravings.  He reports that he is to not use illicit substances and has been maintaining on Suboxone therapy he just wishes that the films were more affordable.  Exam:   Vitals:   11/14/17 0949  BP: (!) 96/55  Pulse: 73  Temp: 98.5 F (36.9 C)  TempSrc: Oral  SpO2: 96%  Weight: 147 lb 12.8 oz (67 kg)    General: initally pacing about exam room, irritable. Eyes: no conjunctival injection CV: RR, NR, no murmur Lungs: CTAB   Assessment/Plan:  See Problem Based Charting in the Encounters Tab     Gust RungHoffman, Erik C, DO  11/15/2017  8:50 AM

## 2017-11-19 LAB — TOXASSURE SELECT,+ANTIDEPR,UR

## 2017-11-24 MED FILL — BUPRENORPHINE HCL-NALOXONE: 8-2 | 7 days supply | Qty: 14 | Fill #0

## 2017-11-29 ENCOUNTER — Ambulatory Visit (INDEPENDENT_AMBULATORY_CARE_PROVIDER_SITE_OTHER): Payer: Self-pay | Admitting: Internal Medicine

## 2017-11-29 ENCOUNTER — Telehealth: Payer: Self-pay

## 2017-11-29 ENCOUNTER — Encounter: Payer: Self-pay | Admitting: Internal Medicine

## 2017-11-29 ENCOUNTER — Other Ambulatory Visit: Payer: Self-pay

## 2017-11-29 VITALS — BP 91/56 | HR 77 | Temp 99.0°F | Ht 71.0 in | Wt 148.0 lb

## 2017-11-29 DIAGNOSIS — F1120 Opioid dependence, uncomplicated: Secondary | ICD-10-CM

## 2017-11-29 DIAGNOSIS — F1190 Opioid use, unspecified, uncomplicated: Secondary | ICD-10-CM

## 2017-11-29 DIAGNOSIS — Z8619 Personal history of other infectious and parasitic diseases: Secondary | ICD-10-CM

## 2017-11-29 DIAGNOSIS — F1199 Opioid use, unspecified with unspecified opioid-induced disorder: Secondary | ICD-10-CM

## 2017-11-29 DIAGNOSIS — Z872 Personal history of diseases of the skin and subcutaneous tissue: Secondary | ICD-10-CM

## 2017-11-29 MED ORDER — BUPRENORPHINE HCL-NALOXONE HCL 8-2 MG SL SUBL: 1.0000 | SUBLINGUAL_TABLET | Freq: Two times a day (BID) | SUBLINGUAL | 0 refills | Status: DC

## 2017-11-29 MED FILL — BUPRENORPHIN-NALOXON 8-2 MG: 8-2 | 14 days supply | Qty: 28 | Fill #0

## 2017-11-29 NOTE — Progress Notes (Signed)
   11/29/17 - 2:45pm  Clifford Tucker presents for follow up of severe opioid use disorder currently on Suboxone therapy. I have reviewed the prior induction visit, follow up visits, and telephone encounters relevant to opiate use disorder (OUD) treatment.   Current daily dose: Generic Bupe-Naloxone 8-2mg  twice daily  Date of Induction: Initially induced at hospital discharge 08/02/17   Current follow up interval, in weeks: 2  The patient has been adherent with the buprenorphine for OUD contract. [place] Controlled Substance Database was appropriate.   Last UDS Result: Appropriate.  HPI: Clifford Tucker is a 37yo man with severe opioid use disorder who presents for OUD management. Induction began 08/02/17 at hospital discharge for MSSA bacteremia with sternoclavicular septic arthritis and several soft-tissue abscesses. He was last seen 2-weeks ago and noted some mild persistent cravings but declined up-titration and was given 2 1-week Rxs. Patient reports his cravings are still mostly controlled and has not used heroin or other opiates in a few weeks. He does admit to occasional THC use.   Exam:   Vitals:   11/29/17 1449  BP: (!) 91/56  Pulse: 77  Temp: 99 F (37.2 C)  SpO2: 97%   General: Thin caucasian male sitting in chair, somewhat restless. NAD. Depressed affect.  Eyes: no conjunctival injection, icterus or ptosis.  CV: RR, NR, no murmur Lungs: CTAB. Normal WOB.   Assessment/Plan:  See Problem Based Charting in the Encounters Tab  Noemi ChapelBethany Barbera Perritt, DO Internal Medicine Resident PGY3 11/29/17 2:45pm

## 2017-11-29 NOTE — Assessment & Plan Note (Signed)
Assessment & Plan:  [name] seems to have his cravings controlled to his satisfaction on current regimen and declined recent opiate use. No complaints today. Will continue current regimen of Suboxone 8-2mg  tablets, 1 tablet twice daily and rx for 2-weeks sent to his pharmacy. -Most recent UDS 6/26 appropriate -Patient to follow-up 7/24 PM in OUD clinic

## 2017-11-29 NOTE — Telephone Encounter (Signed)
Ok with me to put him in an open slot either this afternoon or tomorrow morning.

## 2017-11-29 NOTE — Patient Instructions (Addendum)
It was great seeing you again today. I'm glad you are doing OK.   Please keep up the good work with taking Suboxone twice daily. I'm glad your cravings are mostly controlled on your current regimen. Let us know if you feel that you need better control of your cravings to try to prevent relapse.   I've given you 2 1-week prescriptions and would like to see you back 7/24 to see how you are doing and to get a refill.   Please let us know if you have any questions or concerns.

## 2017-11-29 NOTE — Telephone Encounter (Signed)
[facility].L. Leward Quanucatte, RN, BSN

## 2017-11-29 NOTE — Telephone Encounter (Signed)
Would like to know if pt can be seen today for a refill on Suboxone. Please call back.

## 2017-11-30 NOTE — Progress Notes (Signed)
Internal Medicine Clinic Attending  I saw and evaluated the patient.  I personally confirmed the key portions of the history and exam documented by Dr. Vincente LibertyMolt and I reviewed pertinent patient test results.  The assessment, diagnosis, and plan were formulated together and I agree with the documentation in the resident's note.  Severe opioid use disorder back on treatment and doing well so far. Will continue with suboxone 16mg  daily, follow up in 2 weeks. Urine substance testing at next visit to ensure compliance. He has trouble with morning appointments because of work schedule, so ok for afternoon follow up.

## 2017-12-01 ENCOUNTER — Ambulatory Visit: Payer: Self-pay

## 2017-12-12 ENCOUNTER — Ambulatory Visit (INDEPENDENT_AMBULATORY_CARE_PROVIDER_SITE_OTHER): Payer: Self-pay | Admitting: Student in an Organized Health Care Education/Training Program

## 2017-12-12 VITALS — BP 93/51 | HR 55 | Temp 98.0°F | Wt 150.9 lb

## 2017-12-12 DIAGNOSIS — Z8619 Personal history of other infectious and parasitic diseases: Secondary | ICD-10-CM

## 2017-12-12 DIAGNOSIS — Z560 Unemployment, unspecified: Secondary | ICD-10-CM

## 2017-12-12 DIAGNOSIS — F1120 Opioid dependence, uncomplicated: Secondary | ICD-10-CM

## 2017-12-12 DIAGNOSIS — F1190 Opioid use, unspecified, uncomplicated: Secondary | ICD-10-CM

## 2017-12-12 DIAGNOSIS — F1199 Opioid use, unspecified with unspecified opioid-induced disorder: Secondary | ICD-10-CM

## 2017-12-12 MED ORDER — BUPRENORPHINE HCL-NALOXONE HCL 8-2 MG SL SUBL: 1.0000 | SUBLINGUAL_TABLET | Freq: Two times a day (BID) | SUBLINGUAL | 0 refills | Status: DC

## 2017-12-12 MED FILL — BUPRENORPHIN-NALOXON 8-2 MG: 8-2 | 14 days supply | Qty: 28 | Fill #0

## 2017-12-12 NOTE — Progress Notes (Signed)
   12/12/2017  Clifford Tucker presents for follow up of opioid use disorder I have reviewed the prior induction visit, follow up visits, and telephone encounters relevant to opiate use disorder (OUD) treatment.   Current daily dose:   Date of Induction: 08/02/17  Current follow up interval, in weeks: 2 weeks  The patient has been adherent with the buprenorphine for OUD contract.   Last UDS Result: 11/14/17 had some carboxy-thc  HPI:   Clifford Tucker is a 37 y.o m with severe opioid use disorder who presents for oud follow up.  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  Reports doing well, no relapses, good compliance with Suboxone, no side effects.  Cravings well controlled.  His ex-girlfriend is getting out of an inpatient rehab this week, she was there for 90 days, was unwilling he says is going to try to avoid her because they used to use drugs together.  He does not find group therapy very helpful for him.  No fevers or chills.  Otherwise doing well.  Exam:   Vitals:   12/12/17 0923  BP: (!) 93/51  Pulse: (!) 55  Temp: 98 F (36.7 C)  TempSrc: Oral  SpO2: 99%  Weight: 150 lb 14.4 oz (68.4 kg)    General: Well-appearing man no distress Neck: No thyromegaly, right sternoclavicular scar from previous infection CV: regular rate and rhythm with no murmurs Ext:  Warm well perfused with no edema, normal joints no effusions, no skin or soft tissue infections  Assessment/Plan:  See Problem Based Charting in the Encounters Tab

## 2017-12-12 NOTE — Patient Instructions (Signed)
Keep up the great work. We are all very proud of you.   Follow up with us in 2 weeks.

## 2017-12-12 NOTE — Assessment & Plan Note (Signed)
Severe opioid use disorder with history of infectious complication, endocarditis, doing well now on treatment with Suboxone.  stable for over 1 month.  Plan to continue with Suboxone 16 mg daily.  Follow-up in 2 weeks. urine substance test today.  Reviewed him on the controlled substance database which was appropriate.  Congratulated him, seems that he is doing very well right now and hopefully we can continue this progress.

## 2017-12-16 LAB — TOXASSURE SELECT,+ANTIDEPR,UR

## 2017-12-26 ENCOUNTER — Other Ambulatory Visit: Payer: Self-pay

## 2017-12-26 ENCOUNTER — Ambulatory Visit (INDEPENDENT_AMBULATORY_CARE_PROVIDER_SITE_OTHER): Payer: Self-pay | Admitting: Internal Medicine

## 2017-12-26 VITALS — BP 95/62 | HR 75 | Temp 99.3°F | Ht 71.0 in | Wt 149.0 lb

## 2017-12-26 DIAGNOSIS — F1120 Opioid dependence, uncomplicated: Secondary | ICD-10-CM

## 2017-12-26 DIAGNOSIS — K0889 Other specified disorders of teeth and supporting structures: Secondary | ICD-10-CM

## 2017-12-26 DIAGNOSIS — F1190 Opioid use, unspecified, uncomplicated: Secondary | ICD-10-CM

## 2017-12-26 DIAGNOSIS — F1199 Opioid use, unspecified with unspecified opioid-induced disorder: Secondary | ICD-10-CM

## 2017-12-26 DIAGNOSIS — Z8679 Personal history of other diseases of the circulatory system: Secondary | ICD-10-CM

## 2017-12-26 MED ORDER — BUPRENORPHINE HCL-NALOXONE HCL 8-2 MG SL SUBL: 1.0000 | SUBLINGUAL_TABLET | Freq: Two times a day (BID) | SUBLINGUAL | 0 refills | Status: DC

## 2017-12-26 MED FILL — BUPRENORPHIN-NALOXON 8-2 MG: 8-2 | 14 days supply | Qty: 28 | Fill #0

## 2017-12-26 NOTE — Assessment & Plan Note (Signed)
Patient states that he is doing well since last visit.  His ex-girlfriend got discharged from inpatient rehab facility and he has been interacting with her, but she has not been living with him (she is staying with her mother). The first day after his ex-girlfriend had gotten discharged she asked him to get him heroin but he refused.  The patient states that even though she is around he feels that he will still be able to remain off of substances.  He is currently staying with his mother and caring for his 36-year-old daughter.  The patient's last tox assure was appropriate.  Assessment and plan Patient seems to be stable on current buprenorphine 8 mg twice daily dosage.  Due to his recent stressors of his ex-girlfriend will continue his follow-up every 2 weeks.  [facility] and his use is appropriate.

## 2017-12-26 NOTE — Progress Notes (Signed)
   12/26/2017  Clifford Tucker presents for follow up of opioid use disorder I have reviewed the prior induction visit, follow up visits, and telephone encounters relevant to opiate use disorder (OUD) treatment.   Current daily dose: Suboxone 8 mg twice daily  Date of Induction: 08/02/17  Current follow up interval, in weeks: 2 weeks  The patient has been adherent with the buprenorphine for OUD contract.   Last UDS Result: 12/12/2017, expected for buprenorphine and its metabolites  HPI: [name] is a 37 year old male with opioid use disorder who is here for follow-up for OCD.  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  Patient has been adherent to ODD contract and is currently taking buprenorphine 8 mg twice daily.  Exam:   Vitals:   12/26/17 1459  BP: 95/62  Pulse: 75  Temp: 99.3 F (37.4 C)  TempSrc: Oral  SpO2: 98%  Weight: 149 lb (67.6 kg)  Height: 5\' 11"  (1.803 m)    Physical Exam  Constitutional: He appears well-developed and well-nourished. No distress.  HENT:  Head: Normocephalic and atraumatic.  Poor dentition  Eyes: Conjunctivae are normal.  Cardiovascular: Normal rate, regular rhythm and normal heart sounds.  Respiratory: Effort normal and breath sounds normal. No respiratory distress. He has no wheezes.  GI: Soft. Bowel sounds are normal. He exhibits no distension. There is no tenderness.  Musculoskeletal: He exhibits no edema.  Neurological: He is alert.  Skin: He is not diaphoretic.  Psychiatric: He has a normal mood and affect. His behavior is normal. Judgment and thought content normal.    Assessment/Plan:  See Problem Based Charting in the Encounters Tab     [name], Skila Rollins, MD  12/26/2017  3:21 PM

## 2017-12-26 NOTE — Patient Instructions (Signed)
It was a pleasure to see you today [name]. Please continue your great work with your Suboxone therapy! We have refilled your script. We will see you in 2 weeks. Thank you!  If you have any questions or concerns, please call our clinic at [phone] between 9am-5pm and after hours call [phone] and ask for the internal medicine resident on call. If you feel you are having a medical emergency please call 911.   Thank you, we look forward to help you remain healthy!  Clifford CourierVahini Obaloluwa Delatte, MD Internal Medicine PGY2

## 2017-12-29 NOTE — Progress Notes (Signed)
Internal Medicine Clinic Attending  I saw and evaluated the patient.  I personally confirmed the key portions of the history and exam documented by Dr. Chundi and I reviewed pertinent patient test results.  The assessment, diagnosis, and plan were formulated together and I agree with the documentation in the resident's note. 

## 2018-01-09 ENCOUNTER — Telehealth: Payer: Self-pay | Admitting: General Practice

## 2018-01-11 ENCOUNTER — Ambulatory Visit (INDEPENDENT_AMBULATORY_CARE_PROVIDER_SITE_OTHER): Payer: Self-pay | Admitting: Internal Medicine

## 2018-01-11 ENCOUNTER — Other Ambulatory Visit: Payer: Self-pay

## 2018-01-11 DIAGNOSIS — F1199 Opioid use, unspecified with unspecified opioid-induced disorder: Secondary | ICD-10-CM

## 2018-01-11 DIAGNOSIS — F1190 Opioid use, unspecified, uncomplicated: Secondary | ICD-10-CM

## 2018-01-11 DIAGNOSIS — F1120 Opioid dependence, uncomplicated: Secondary | ICD-10-CM

## 2018-01-11 MED ORDER — BUPRENORPHINE HCL-NALOXONE HCL 8-2 MG SL SUBL: 1.0000 | SUBLINGUAL_TABLET | Freq: Two times a day (BID) | SUBLINGUAL | 0 refills | Status: DC

## 2018-01-11 MED FILL — BUPRENORPHIN-NALOXON 8-2 MG: 8-2 | 14 days supply | Qty: 28 | Fill #0

## 2018-01-11 NOTE — Progress Notes (Signed)
   01/11/2018  Clifford Tucker presents for follow up of opioid use disorder I have reviewed the prior induction visit, follow up visits, and telephone encounters relevant to opiate use disorder (OUD) treatment.   Current daily dose: Buprenorphine 8mg  BID  Date of Induction: 08/02/17  Current follow up interval, in weeks: 4 weeks  The patient has been adherent with the buprenorphine for OUD contract.   Last UDS Result: 7/23 and as expected  HPI: Clifford Tucker is a 37yo man with PMH of OUD here for follow up.  He has been doing very well.  He has no complaints, no cravings and has not used any substances.  He has an 36 year old daughter and has been spending time with her this summer.  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  The job is for 3 months, 6 days a week and he is not sure he will be able to get time off.  He further notes an episode of ringing in his ear for an hour which resolved and some mild dizziness.  He denies that these were life limiting, and they seem to be getting better.    Exam:   Vitals:   01/11/18 1038  BP: 104/60  Pulse: 86  Temp: 99.3 F (37.4 C)  TempSrc: Oral  SpO2: 99%  Weight: 147 lb 4.8 oz (66.8 kg)  Height: 5\' 10"  (1.778 m)    General: Awake, alert, NAD HENT: Left ear canal clear and dry, TM on the left is normal appearing without retraction or erythema Neck: Supple, no LAD Pulm: Breathing comfortably, no wheezing Psych: Flat affect, calm  Assessment/Plan:  See Problem Based Charting in the Encounters Tab  Dizziness/hearing issues - Not causing significant issues today, appear to be improving.  Likely viral illness.  He is to call in or let us know if the symptoms are progressing or return.    Inez CatalinaMullen, Dedra Matsuo B, MD  01/11/2018  10:58 AM

## 2018-01-11 NOTE — Patient Instructions (Signed)
Thayer OhmChris - -  Thank you so much for coming in today.  You are doing so well and we want to continue to support your recovery  Please call the clinic if you are not able to make it back to town due to your job and we will work with you.   Please come back in 4 weeks if you can.   If your dizziness or ear issues worsen or come back, please call the clinic.    Thank you

## 2018-01-12 ENCOUNTER — Encounter: Payer: Self-pay | Admitting: Internal Medicine

## 2018-01-12 NOTE — Assessment & Plan Note (Addendum)
Very well controlled.  No acute issues today.  Refill history appropriate. He will continue on current therapy and move to every 4 week appointments.  He notes that he may not be able to get back for appointments based on his job for 3 months.  I advised him that we will work with him, but it would be best to at least see him monthly.  He will call us if he cannot make it in 1 month and we will re-assess.  Plan Continue suboxone 8-2mg  BID Follow up in 4 weeks PRMP appropriate.

## 2018-01-19 ENCOUNTER — Emergency Department (HOSPITAL_COMMUNITY): Payer: Medicaid Other

## 2018-01-19 ENCOUNTER — Emergency Department (HOSPITAL_COMMUNITY)
Admission: EM | Admit: 2018-01-19 | Discharge: 2018-01-19 | Disposition: A | Discharge status: Home / Self Care | Payer: Medicaid Other | Attending: Emergency Medicine | Admitting: Emergency Medicine

## 2018-01-19 ENCOUNTER — Other Ambulatory Visit: Payer: Self-pay

## 2018-01-19 ENCOUNTER — Encounter (HOSPITAL_COMMUNITY): Payer: Self-pay | Admitting: Emergency Medicine

## 2018-01-19 DIAGNOSIS — Z96642 Presence of left artificial hip joint: Secondary | ICD-10-CM | POA: Insufficient documentation

## 2018-01-19 DIAGNOSIS — F17210 Nicotine dependence, cigarettes, uncomplicated: Secondary | ICD-10-CM | POA: Diagnosis not present

## 2018-01-19 DIAGNOSIS — M25521 Pain in right elbow: Secondary | ICD-10-CM | POA: Diagnosis not present

## 2018-01-19 DIAGNOSIS — M546 Pain in thoracic spine: Secondary | ICD-10-CM | POA: Diagnosis not present

## 2018-01-19 DIAGNOSIS — Y999 Unspecified external cause status: Secondary | ICD-10-CM | POA: Insufficient documentation

## 2018-01-19 DIAGNOSIS — Y929 Unspecified place or not applicable: Secondary | ICD-10-CM | POA: Diagnosis not present

## 2018-01-19 DIAGNOSIS — W0110XA Fall on same level from slipping, tripping and stumbling with subsequent striking against unspecified object, initial encounter: Secondary | ICD-10-CM | POA: Diagnosis not present

## 2018-01-19 DIAGNOSIS — R079 Chest pain, unspecified: Secondary | ICD-10-CM | POA: Diagnosis not present

## 2018-01-19 DIAGNOSIS — W19XXXA Unspecified fall, initial encounter: Secondary | ICD-10-CM

## 2018-01-19 DIAGNOSIS — Y9301 Activity, walking, marching and hiking: Secondary | ICD-10-CM | POA: Insufficient documentation

## 2018-01-19 DIAGNOSIS — M542 Cervicalgia: Secondary | ICD-10-CM | POA: Diagnosis not present

## 2018-01-19 MED ORDER — METHOCARBAMOL 500 MG PO TABS: 1000.0000 mg | ORAL_TABLET | Freq: Four times a day (QID) | ORAL | 0 refills | Status: DC | PRN

## 2018-01-19 NOTE — Discharge Instructions (Signed)
Take the prescription as directed.  Apply moist heat or ice to the area(s) of discomfort, for 15 minutes at a time, several times per day for the next few days.  Do not fall asleep on a heating or ice pack.  Call your regular medical doctor on Monday to schedule a follow up appointment next week.  Return to the Emergency Department immediately if worsening. ° °

## 2018-01-19 NOTE — ED Triage Notes (Signed)
Pt states he was in a public bathroom, slipped on fluid on the floor falling on his right elbow and c/o of pain to his neck radiating to his back.  c collar in place

## 2018-01-19 NOTE — ED Provider Notes (Signed)
[facility] EMERGENCY DEPARTMENT Provider Note   CSN: [id number] Arrival date & time: 01/19/18  1742     History   Chief Complaint Chief Complaint  Patient presents with  . Fall    HPI Clifford Tucker is a 37 y.o. male.  HPI  Pt was seen at 1910. Per pt, c/o sudden onset and resolution of one episode of slip and fall PTA. Pt states he slipped on fluid on the floor and fell to his right side. Pt c/o right elbow pain, and right sided neck pain. Denies LOC, no AMS, no head injury, no back pain, no CP/SOB, no abd pain, no N/V/D, no focal motor weakness, no tingling/numbness in extremities.   Past Medical History:  Diagnosis Date  . Bacteremia due to methicillin susceptible Staphylococcus aureus (MSSA) 06/06/2017  . Chest wall abscess   . Hx of septic arthritis    Left hip  . 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 Left hip postoperative wound infection   . MSSA (methicillin susceptible Staphylococcus aureus) 05/2017  . MSSA bacteremia   . Neck abscess 06/05/2017  . Normocytic anemia   . Opioid use disorder (HCC) 08/08/2017  . Opioid use disorder (HCC)   . Pneumonia   . Septic arthritis of hip (HCC) 06/06/2017  . Septic arthritis of sternoclavicular joint, right (HCC) 06/06/2017    Patient Active Problem List   Diagnosis Date Noted  . Insomnia 08/16/2017  . Opioid use disorder (HCC) 08/08/2017  . Anxiety     Past Surgical History:  Procedure Laterality Date  . APPLICATION OF WOUND VAC Right 06/07/2017   Procedure: APPLICATION OF WOUND VAC;  Surgeon: Kerin PernaVan Trigt, Peter, MD;  [facility];  Service: Thoracic;  Laterality: Right;  . APPLICATION OF WOUND VAC Right 06/15/2017   Procedure: WOUND VAC CHANGE;  Surgeon: Kerin PernaVan Trigt, Peter, MD;  [facility];  Service: Vascular;  Laterality: Right;  . APPLICATION OF WOUND VAC N/A 06/22/2017   Procedure: Irrigation of chest wall wound with WOUND VAC CHANGE;  Surgeon: Kerin PernaVan Trigt, Peter, MD;  [facility];  Service: Thoracic;   Laterality: N/A;  . APPLICATION OF WOUND VAC Right 06/29/2017   Procedure: REMOVAL OF WOUND VAC AND IRRIGATION AND DEBRIDEMENT RIGHT CHEST WOUND;  Surgeon: Kerin PernaVan Trigt, Peter, MD;  [facility];  Service: Thoracic;  Laterality: Right;  . APPLICATION OF WOUND VAC Left 06/29/2017   Procedure: APPLICATION OF INCISIONAL  WOUND VAC LEFT HIP;  Surgeon: Cammy Copaean, Gregory Scott, MD;  Location: [facility] OR;  Service: Orthopedics;  Laterality: Left;  . HIP DEBRIDEMENT Left 06/06/2017   IRRIGATION AND DEBRIDEMENT HIP  . I&D EXTREMITY Right 06/15/2017   Procedure: IRRIGATION OF RIGHT STERNOCLAVICULAR WOUND;  Surgeon: Kerin PernaVan Trigt, Peter, MD;  [facility];  Service: Vascular;  Laterality: Right;  . INCISION AND DRAINAGE HIP Left 06/06/2017   Procedure: IRRIGATION AND DEBRIDEMENT HIP;  Surgeon: Cammy Copaean, Gregory Scott, MD;  [facility];  Service: Orthopedics;  Laterality: Left;  . SHOULDER SURGERY    . STERNAL WOUND DEBRIDEMENT Right 06/07/2017   Procedure: DEBRIDEMENT RIGHT STERNOCLAVICULAR ABSCESS;  Surgeon: Kerin PernaVan Trigt, Peter, MD;  [facility];  Service: Thoracic;  Laterality: Right;  . TEE WITHOUT CARDIOVERSION N/A 07/21/2017   Procedure: TRANSESOPHAGEAL ECHOCARDIOGRAM (TEE);  Surgeon: Lewayne Buntingrenshaw, Brian S, MD;  [facility];  Service: Cardiovascular;  Laterality: N/A;  . TOTAL HIP ARTHROPLASTY Left 06/29/2017   Procedure: I&D LEFT HIP/ANTERIOR, Stimulan beads,;  Surgeon: Cammy Copaean, Gregory Scott, MD;  [facility];  Service: Orthopedics;  Laterality: Left;        Home Medications    Prior to Admission medications   Medication Sig Start Date End Date Taking? Authorizing Provider  buprenorphine-naloxone (SUBOXONE) 8-2 mg SUBL SL tablet Place 1 tablet under the tongue 2 (two) times daily. 01/11/18   Inez Catalina, MD    Family History Family History  Problem Relation Age of Onset  . Drug abuse Mother     Social History Social History   Tobacco Use  . Smoking status: Current Every Day Smoker     Packs/day: 1.00    Types: Cigarettes  . Smokeless tobacco: Never Used  . Tobacco comment: 1.5 PPD  Substance Use Topics  . Alcohol use: Yes    Comment: RARE  . Drug use: Yes    Types: Marijuana, Heroin    Comment: 06/05/17     Allergies   Patient has no known allergies.   Review of Systems Review of Systems ROS: Statement: All systems negative except as marked or noted in the HPI; Constitutional: Negative for fever and chills. ; ; Eyes: Negative for eye pain, redness and discharge. ; ; ENMT: Negative for ear pain, hoarseness, nasal congestion, sinus pressure and sore throat. ; ; Cardiovascular: Negative for chest pain, palpitations, diaphoresis, dyspnea and peripheral edema. ; ; Respiratory: Negative for cough, wheezing and stridor. ; ; Gastrointestinal: Negative for nausea, vomiting, diarrhea, abdominal pain, blood in stool, hematemesis, jaundice and rectal bleeding. . ; ; Genitourinary: Negative for dysuria, flank pain and hematuria. ; ; Musculoskeletal: +right elbow pain, right sided neck pain. Negative for back pain. Negative for swelling and deformity.; ; Skin: Negative for pruritus, rash, abrasions, blisters, bruising and skin lesion.; ; Neuro: Negative for headache, lightheadedness and neck stiffness. Negative for weakness, altered level of consciousness, altered mental status, extremity weakness, paresthesias, involuntary movement, seizure and syncope.       Physical Exam Updated Vital Signs BP 116/77   Pulse 79   Temp 99.1 F (37.3 C) (Oral)   Resp 16   Ht 5\' 11"  (1.803 m)   Wt 68 kg   SpO2 96%   BMI 20.92 kg/m   Physical Exam 1915: Physical examination:  Nursing notes reviewed; Vital signs and O2 SAT reviewed;  Constitutional: Well developed, Well nourished, Well hydrated, In no acute distress; Head:  Normocephalic, atraumatic; Eyes: EOMI, PERRL, No scleral icterus; ENMT: TM's clear bilat. Mouth and pharynx normal, Mucous membranes moist; Neck: Immobilized in C-collar.  No lymphadenopathy; Cardiovascular: Regular rate and rhythm, No gallop; Respiratory: Breath sounds clear & equal bilaterally, No wheezes.  Speaking full sentences with ease, Normal respiratory effort/excursion; Chest: Nontender, Movement normal; Abdomen: Soft, Nontender, Nondistended, Normal bowel sounds; Genitourinary: No CVA tenderness; Spine:  No midline CS, TS, LS tenderness. +TTP right hypertonic trapezius muscle.;; Extremities: Peripheral pulses normal. NT right shoulder/wrist/hand. +mild TTP right olecranon area without ecchymosis, edema, abrasions or deformity. FROM right elbow. Muscles compartments soft, strong radial pulse..; Neuro: AA&Ox3, Major CN grossly intact. No facial droop. Speech clear. No gross focal motor or sensory deficits in extremities. Climbs on and off stretcher easily by himself. Gait steady..; Skin: Color normal, Warm, Dry.   ED Treatments / Results  Labs (all labs ordered are listed, but only abnormal results are displayed)   EKG None  Radiology   Procedures Procedures (including critical care time)  Medications Ordered in ED Medications - No data to display   Initial Impression / Assessment and Plan / ED Course  I have reviewed the triage vital signs and the nursing notes.  Pertinent labs & imaging results that were available during my care of the patient were reviewed by me and considered in my medical decision making (see chart for details).  MDM Reviewed: previous chart, nursing note and vitals Interpretation: x-ray and CT scan   Dg Chest 2 View Result Date: 01/19/2018 CLINICAL DATA:  Fall with chest pain EXAM: CHEST - 2 VIEW COMPARISON:  Chest radiograph 07/14/2017 FINDINGS: The lungs are hyperinflated with diffuse interstitial prominence. No focal airspace consolidation or pulmonary edema. No pleural effusion or pneumothorax. Normal cardiomediastinal contours. IMPRESSION: Hyperinflated lungs without focal airspace disease. No rib fracture.  Electronically Signed   By: Deatra Robinson M.D.   On: 01/19/2018 20:09   Dg Thoracic Spine 2 View Result Date: 01/19/2018 CLINICAL DATA:  Patient states he was in a public bathroom, slipped on fluid on the floor falling on his right elbow and states pain to his neck radiating to his back. EXAM: THORACIC SPINE 2 VIEWS COMPARISON:  01/19/2018 chest x-ray FINDINGS: There is no evidence of thoracic spine fracture. Alignment is normal. No other significant bone abnormalities are identified. IMPRESSION: Negative. Electronically Signed   By: Norva Pavlov M.D.   On: 01/19/2018 20:21   Dg Lumbar Spine Complete Result Date: 01/19/2018 CLINICAL DATA:  Fall EXAM: LUMBAR SPINE - COMPLETE 4+ VIEW COMPARISON:  CT abdomen pelvis 06/14/2017 FINDINGS: Negative for acute fracture.  Disc spaces maintained Grade 1 anterolisthesis L5-S1 with bilateral pars defects of L5 unchanged from the prior CT. IMPRESSION: No acute fracture. Bilateral pars defects of L5 Electronically Signed   By: Marlan Palau M.D.   On: 01/19/2018 20:20   Dg Elbow Complete Right Result Date: 01/19/2018 CLINICAL DATA:  Fall EXAM: RIGHT ELBOW - COMPLETE 3+ VIEW COMPARISON:  None. FINDINGS: There is no evidence of fracture, dislocation, or joint effusion. There is no evidence of arthropathy or other focal bone abnormality. Soft tissues are unremarkable. IMPRESSION: Negative. Electronically Signed   By: Marlan Palau M.D.   On: 01/19/2018 18:46   Ct Cervical Spine Wo Contrast Result Date: 01/19/2018 CLINICAL DATA:  Posterior right-sided neck pain radiating down mid to upper back after slip on floor. EXAM: CT CERVICAL SPINE WITHOUT CONTRAST TECHNIQUE: Multidetector CT imaging of the cervical spine was performed without intravenous contrast. Multiplanar CT image reconstructions were also generated. COMPARISON:  06/05/2017 FINDINGS: Alignment: Normal. Skull base and vertebrae: Acute intracranial hemorrhage of the skull base. Midline fourth ventricle  without effacement. No extra-axial fluid collections are noted. No fracture of the skull base. Intact vertebral bodies. Soft tissues and spinal canal: No prevertebral fluid or swelling. No visible canal hematoma. Disc levels: No focal disc herniation, canal or foraminal stenosis. No jumped or perched facets. Upper chest: Clear lung apices. Other: None IMPRESSION: No cervical spine fracture or static listhesis. Electronically Signed   By: Tollie Eth M.D.   On: 01/19/2018 20:21    2030:  Pt insistent to [employer] Tech that he "needed his whole back" imaged; XR obtained as requested. No midline CS tenderness, FROM CS without midline tenderness. No NMS changes. C-collar removed. XR/CT reassuring. Tx symptomatically at this time. Dx and testing d/w pt and family.  Questions answered.  Verb understanding, agreeable to d/c home with outpt f/u.     Final Clinical Impressions(s) / ED Diagnoses   Final diagnoses:  None    ED Discharge Orders    None       Samuel Jester,  DO 01/23/18 1714

## 2018-01-23 MED FILL — BUPRENORPHIN-NALOXON 8-2 MG: 8-2 | 14 days supply | Qty: 28 | Fill #1

## 2018-02-06 ENCOUNTER — Ambulatory Visit (INDEPENDENT_AMBULATORY_CARE_PROVIDER_SITE_OTHER): Payer: Medicaid Other | Admitting: Internal Medicine

## 2018-02-06 VITALS — BP 113/78 | HR 71 | Temp 98.9°F | Wt 146.2 lb

## 2018-02-06 DIAGNOSIS — R109 Unspecified abdominal pain: Secondary | ICD-10-CM | POA: Diagnosis not present

## 2018-02-06 DIAGNOSIS — R1084 Generalized abdominal pain: Secondary | ICD-10-CM

## 2018-02-06 DIAGNOSIS — F1120 Opioid dependence, uncomplicated: Secondary | ICD-10-CM | POA: Diagnosis not present

## 2018-02-06 DIAGNOSIS — Z9181 History of falling: Secondary | ICD-10-CM | POA: Diagnosis not present

## 2018-02-06 DIAGNOSIS — F1199 Opioid use, unspecified with unspecified opioid-induced disorder: Secondary | ICD-10-CM

## 2018-02-06 DIAGNOSIS — Z791 Long term (current) use of non-steroidal anti-inflammatories (NSAID): Secondary | ICD-10-CM

## 2018-02-06 DIAGNOSIS — F1190 Opioid use, unspecified, uncomplicated: Secondary | ICD-10-CM

## 2018-02-06 MED ORDER — BUPRENORPHINE HCL-NALOXONE HCL 8-2 MG SL SUBL: 1.0000 | SUBLINGUAL_TABLET | Freq: Two times a day (BID) | SUBLINGUAL | 0 refills | Status: DC

## 2018-02-06 MED ORDER — OMEPRAZOLE 40 MG PO CPDR: 40.0000 mg | DELAYED_RELEASE_CAPSULE | Freq: Every day | ORAL | 2 refills | Status: DC

## 2018-02-06 MED FILL — BUPRENORPHIN-NALOXON 8-2 MG: 8-2 | 14 days supply | Qty: 28 | Fill #0

## 2018-02-06 NOTE — Assessment & Plan Note (Signed)
Advised trial of D/C of goody powder and start Omeprazole 40mg  once daily for 1 month at least, if benefit may complete 3 month course.

## 2018-02-06 NOTE — Patient Instructions (Signed)
Stop taking goody powders, I want you to try taking Omeprazole 20mg  a day for at least a month for your stomach.

## 2018-02-06 NOTE — Progress Notes (Signed)
   02/06/2018  [name] presents for follow up of opioid use disorder I have reviewed the prior induction visit, follow up visits, and telephone encounters relevant to opiate use disorder (OUD) treatment.   Current daily dose: Buprenorphine 8mg  BID  Date of Induction: 08/02/17  Current follow up interval, in weeks: 4 weeks  The patient has been adherent with the buprenorphine for OUD contract.   Last UDS Result: 7/23 and as expected  HPI: Clifford Tucker is a 37yo man with PMH of OUD here for follow up.  He has been doing very well.  He has no complaints, no cravings and has not used any substances.  Job has fallen thru, still looking for work, dependent on mom to help afford medication.  Cravings are well controlled.  Only issue today is abdominal pain, intermittently, no heart burn, does have excessive eructations.  Has been ongoing for about the last month,  Concern about possible blood in stool but unsure.  Does take goody powders for several years.  Also did have fall a few weeks ago, seen in ED, Xrays revealed no fractures, has healed well no ongoing complaints.    Exam:   Vitals:   02/06/18 1045  BP: 113/78  Pulse: 71  Temp: 98.9 F (37.2 C)  TempSrc: Oral  SpO2: 98%  Weight: 146 lb 3.2 oz (66.3 kg)    General: Awake, alert, NAD Eye,; no pallor Card: RRR Pulm: Breathing comfortably, no wheezing Abd, soft and nontender Psych: Flat affect, calm  Assessment/Plan:  See Problem Based Charting in the Encounters Tab    Gust RungHoffman, Erik C, DO  02/06/2018  2:02 PM

## 2018-02-06 NOTE — Assessment & Plan Note (Signed)
-  Doing well, UTox today, Continue Suboxone 16mg  total daily dose, follow up in 4 weeks.

## 2018-02-14 LAB — TOXASSURE SELECT,+ANTIDEPR,UR

## 2018-02-19 ENCOUNTER — Other Ambulatory Visit: Payer: Self-pay

## 2018-02-19 ENCOUNTER — Encounter (HOSPITAL_COMMUNITY): Payer: Self-pay | Admitting: Emergency Medicine

## 2018-02-19 ENCOUNTER — Emergency Department (HOSPITAL_COMMUNITY)
Admission: EM | Admit: 2018-02-19 | Discharge: 2018-02-19 | Disposition: A | Discharge status: Home / Self Care | Payer: Medicaid Other | Attending: Emergency Medicine | Admitting: Emergency Medicine

## 2018-02-19 ENCOUNTER — Emergency Department (HOSPITAL_COMMUNITY): Payer: Medicaid Other

## 2018-02-19 DIAGNOSIS — Y999 Unspecified external cause status: Secondary | ICD-10-CM | POA: Diagnosis not present

## 2018-02-19 DIAGNOSIS — S51812A Laceration without foreign body of left forearm, initial encounter: Secondary | ICD-10-CM

## 2018-02-19 DIAGNOSIS — Z96642 Presence of left artificial hip joint: Secondary | ICD-10-CM | POA: Diagnosis not present

## 2018-02-19 DIAGNOSIS — S51822A Laceration with foreign body of left forearm, initial encounter: Secondary | ICD-10-CM | POA: Diagnosis not present

## 2018-02-19 DIAGNOSIS — Y9389 Activity, other specified: Secondary | ICD-10-CM | POA: Diagnosis not present

## 2018-02-19 DIAGNOSIS — S56222A Laceration of other flexor muscle, fascia and tendon at forearm level, left arm, initial encounter: Secondary | ICD-10-CM | POA: Insufficient documentation

## 2018-02-19 DIAGNOSIS — Z79899 Other long term (current) drug therapy: Secondary | ICD-10-CM | POA: Diagnosis not present

## 2018-02-19 DIAGNOSIS — W298XXA Contact with other powered powered hand tools and household machinery, initial encounter: Secondary | ICD-10-CM | POA: Diagnosis not present

## 2018-02-19 DIAGNOSIS — S51802A Unspecified open wound of left forearm, initial encounter: Secondary | ICD-10-CM

## 2018-02-19 DIAGNOSIS — Y929 Unspecified place or not applicable: Secondary | ICD-10-CM | POA: Insufficient documentation

## 2018-02-19 DIAGNOSIS — Z23 Encounter for immunization: Secondary | ICD-10-CM | POA: Diagnosis not present

## 2018-02-19 DIAGNOSIS — F17210 Nicotine dependence, cigarettes, uncomplicated: Secondary | ICD-10-CM | POA: Insufficient documentation

## 2018-02-19 MED ORDER — DOXYCYCLINE HYCLATE 100 MG PO CAPS: 100.0000 mg | ORAL_CAPSULE | Freq: Two times a day (BID) | ORAL | 0 refills | Status: DC

## 2018-02-19 MED ORDER — IBUPROFEN 600 MG PO TABS: 600.0000 mg | ORAL_TABLET | Freq: Four times a day (QID) | ORAL | 0 refills | Status: DC

## 2018-02-19 MED ORDER — IBUPROFEN 400 MG PO TABS
400.0000 mg | ORAL_TABLET | Freq: Once | ORAL | Status: AC
  Administered 2018-02-19: 400 mg via ORAL
  Filled 2018-02-19: qty 1

## 2018-02-19 MED ORDER — HYDROCODONE-ACETAMINOPHEN 5-325 MG PO TABS
2.0000 | ORAL_TABLET | Freq: Once | ORAL | Status: AC
  Administered 2018-02-19: 2 via ORAL
  Filled 2018-02-19: qty 2

## 2018-02-19 MED ORDER — POVIDONE-IODINE 10 % EX SOLN
CUTANEOUS | Status: AC
  Filled 2018-02-19: qty 15

## 2018-02-19 MED ORDER — DOXYCYCLINE HYCLATE 100 MG PO TABS
100.0000 mg | ORAL_TABLET | Freq: Once | ORAL | Status: AC
  Administered 2018-02-19: 100 mg via ORAL
  Filled 2018-02-19: qty 1

## 2018-02-19 MED ORDER — ONDANSETRON HCL 4 MG PO TABS
4.0000 mg | ORAL_TABLET | Freq: Once | ORAL | Status: AC
  Administered 2018-02-19: 4 mg via ORAL
  Filled 2018-02-19: qty 1

## 2018-02-19 MED ORDER — TETANUS-DIPHTH-ACELL PERTUSSIS 5-2.5-18.5 LF-MCG/0.5 IM SUSP
0.5000 mL | Freq: Once | INTRAMUSCULAR | Status: AC
  Administered 2018-02-19: 0.5 mL via INTRAMUSCULAR
  Filled 2018-02-19: qty 0.5

## 2018-02-19 MED ORDER — LIDOCAINE HCL (PF) 1 % IJ SOLN
INTRAMUSCULAR | Status: AC
  Filled 2018-02-19: qty 10

## 2018-02-19 MED ORDER — HYDROCODONE-ACETAMINOPHEN 5-325 MG PO TABS: 1.0000 | ORAL_TABLET | ORAL | 0 refills | Status: DC | PRN

## 2018-02-19 NOTE — ED Notes (Signed)
Pt verbalized understanding of no driving and to use caution within 4 hours of taking pain meds due to meds cause drowsiness.  Instructed pt to take all of antibiotics as prescribed. 

## 2018-02-19 NOTE — ED Provider Notes (Signed)
[facility] EMERGENCY DEPARTMENT Provider Note   CSN: [id number] Arrival date & time: 02/19/18  1541     History   Chief Complaint Chief Complaint  Patient presents with  . Arm Injury    left    HPI Fiore Detjen is a 37 y.o. male.  Patient is a 37 year old male who presents to the emergency department with an injury to his left arm.  The patient states that he was drilling a piece of metal, the drill jumped and he sustained an injury to the left arm.  He was able to control the bleeding by applying pressure.  The patient denies being on any anticoagulation medications.  He has not had any recent operations or procedures involving his left upper extremity.  No other injury was reported.  Patient unsure of the date of his last tetanus.  The history is provided by the patient.    Past Medical History:  Diagnosis Date  . Bacteremia due to methicillin susceptible Staphylococcus aureus (MSSA) 06/06/2017  . Chest wall abscess   . Hx of septic arthritis    Left hip  . 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 Left hip postoperative wound infection   . MSSA (methicillin susceptible Staphylococcus aureus) 05/2017  . MSSA bacteremia   . Neck abscess 06/05/2017  . Normocytic anemia   . Opioid use disorder (HCC) 08/08/2017  . Opioid use disorder (HCC)   . Pneumonia   . Septic arthritis of hip (HCC) 06/06/2017  . Septic arthritis of sternoclavicular joint, right (HCC) 06/06/2017    Patient Active Problem List   Diagnosis Date Noted  . Abdominal pain 02/06/2018  . Insomnia 08/16/2017  . Opioid use disorder (HCC) 08/08/2017  . Anxiety     Past Surgical History:  Procedure Laterality Date  . APPLICATION OF WOUND VAC Right 06/07/2017   Procedure: APPLICATION OF WOUND VAC;  Surgeon: Kerin Perna, MD;  Location: [facility] OR;  Service: Thoracic;  Laterality: Right;  . APPLICATION OF WOUND VAC Right 06/15/2017   Procedure: WOUND VAC CHANGE;  Surgeon: Kerin Perna, MD;   Location: [facility] OR;  Service: Vascular;  Laterality: Right;  . APPLICATION OF WOUND VAC N/A 06/22/2017   Procedure: Irrigation of chest wall wound with WOUND VAC CHANGE;  Surgeon: Kerin Perna, MD;  Location: [facility] OR;  Service: Thoracic;  Laterality: N/A;  . APPLICATION OF WOUND VAC Right 06/29/2017   Procedure: REMOVAL OF WOUND VAC AND IRRIGATION AND DEBRIDEMENT RIGHT CHEST WOUND;  Surgeon: Kerin Perna, MD;  Location: [facility] OR;  Service: Thoracic;  Laterality: Right;  . APPLICATION OF WOUND VAC Left 06/29/2017   Procedure: APPLICATION OF INCISIONAL  WOUND VAC LEFT HIP;  Surgeon: Cammy Copa, MD;  Location: [facility] OR;  Service: Orthopedics;  Laterality: Left;  . HIP DEBRIDEMENT Left 06/06/2017   IRRIGATION AND DEBRIDEMENT HIP  . I&D EXTREMITY Right 06/15/2017   Procedure: IRRIGATION OF RIGHT STERNOCLAVICULAR WOUND;  Surgeon: Kerin Perna, MD;  Location: [facility] OR;  Service: Vascular;  Laterality: Right;  . INCISION AND DRAINAGE HIP Left 06/06/2017   Procedure: IRRIGATION AND DEBRIDEMENT HIP;  Surgeon: Cammy Copa, MD;  Location: [facility] OR;  Service: Orthopedics;  Laterality: Left;  . SHOULDER SURGERY    . STERNAL WOUND DEBRIDEMENT Right 06/07/2017   Procedure: DEBRIDEMENT RIGHT STERNOCLAVICULAR ABSCESS;  Surgeon: Kerin Perna, MD;  Location: [facility] OR;  Service: Thoracic;  Laterality: Right;  . TEE WITHOUT CARDIOVERSION N/A 07/21/2017   Procedure: TRANSESOPHAGEAL ECHOCARDIOGRAM (TEE);  Surgeon: Lewayne Bunting, MD;  Location: [facility] ENDOSCOPY;  Service: Cardiovascular;  Laterality: N/A;  . TOTAL HIP ARTHROPLASTY Left 06/29/2017   Procedure: I&D LEFT HIP/ANTERIOR, Stimulan beads,;  Surgeon: Cammy Copa, MD;  Location: [facility] OR;  Service: Orthopedics;  Laterality: Left;        Home Medications    Prior to Admission medications   Medication Sig Start Date End Date Taking? Authorizing Provider  buprenorphine-naloxone (SUBOXONE) 8-2 mg SUBL SL tablet Place 1 tablet under the tongue 2 (two)  times daily. 02/06/18   Gust Rung, DO  doxycycline (VIBRAMYCIN) 100 MG capsule Take 1 capsule (100 mg total) by mouth 2 (two) times daily. 02/19/18   Ivery Quale, PA-C  HYDROcodone-acetaminophen (NORCO/VICODIN) 5-325 MG tablet Take 1 tablet by mouth every 4 (four) hours as needed. 02/19/18   Ivery Quale, PA-C  ibuprofen (ADVIL,MOTRIN) 600 MG tablet Take 1 tablet (600 mg total) by mouth 4 (four) times daily. 02/19/18   Ivery Quale, PA-C  methocarbamol (ROBAXIN) 500 MG tablet Take 2 tablets (1,000 mg total) by mouth 4 (four) times daily as needed for muscle spasms (muscle spasm/pain). 01/19/18   Samuel Jester, DO  omeprazole (PRILOSEC) 40 MG capsule Take 1 capsule (40 mg total) by mouth daily. 02/06/18   Gust Rung, DO    Family History Family History  Problem Relation Age of Onset  . Drug abuse Mother     Social History Social History   Tobacco Use  . Smoking status: Current Every Day Smoker    Packs/day: 1.00    Types: Cigarettes  . Smokeless tobacco: Never Used  . Tobacco comment: 1.5 PPD  Substance Use Topics  . Alcohol use: Yes    Comment: RARE  . Drug use: Yes    Types: Marijuana, Heroin    Comment: 06/05/17     Allergies   Patient has no known allergies.   Review of Systems Review of Systems   Physical Exam Updated Vital Signs BP (!) 96/58   Pulse 77   Temp 98.1 F (36.7 C) (Oral)   Resp 16   SpO2 99%   Physical Exam  Constitutional: He is oriented to person, place, and time. He appears well-developed and well-nourished.  Non-toxic appearance.  HENT:  Head: Normocephalic.  Right Ear: Tympanic membrane and external ear normal.  Left Ear: Tympanic membrane and external ear normal.  Eyes: Pupils are equal, round, and reactive to light. EOM and lids are normal.  Neck: Normal range of motion. Neck supple. Carotid bruit is not present.  Cardiovascular: Normal rate, regular rhythm, normal heart sounds, intact distal pulses and normal pulses.    Pulmonary/Chest: Breath sounds normal. No respiratory distress.  Abdominal: Soft. Bowel sounds are normal. There is no tenderness. There is no guarding.  Musculoskeletal: Normal range of motion.  Patient has a 6 cm laceration to the palmar surface of the left wrist forearm.  There is tendon involvement.  Bleeding is controlled.  There is no bony involvement.  Capillary refill is less than 2 seconds.  Radial pulses are 2+.  There is no evidence of injury to the palmar arch.  There is an abrasion just below the laceration, bleeding has been controlled on it as well.  Lymphadenopathy:       Head (right side): No submandibular adenopathy present.       Head (left side): No submandibular adenopathy present.    He has no cervical adenopathy.  Neurological: He is alert and oriented to person, place,  and time. He has normal strength. No cranial nerve deficit or sensory deficit.  Skin: Skin is warm and dry.  Psychiatric: He has a normal mood and affect. His speech is normal.  Nursing note and vitals reviewed.    ED Treatments / Results  Labs (all labs ordered are listed, but only abnormal results are displayed) Labs Reviewed - No data to display  EKG None  Radiology Dg Forearm Left  Result Date: 02/19/2018 CLINICAL DATA:  Drill bit injury causing laceration to the left forearm. EXAM: LEFT FOREARM - 2 VIEW COMPARISON:  None. FINDINGS: There is a soft tissue injury over the anterior distal forearm near the wrist. 4 tiny foci of increased density along the laceration consistent metal fragments. These may reside on the skin surface. No fracture.  No bone lesion. Wrist and elbow joints are normally spaced and aligned. IMPRESSION: 1. No fracture, bone lesion or joint abnormality. 2. Soft tissue laceration to the distal anterior forearm. Four tiny, superficial, radiopaque foreign bodies project along the deep margin of the laceration and adjacent to the laceration. Electronically Signed   By: Amie Portland M.D.   On: 02/19/2018 16:55    Procedures .Marland KitchenLaceration Repair Date/Time: 02/19/2018 6:43 PM Performed by: Ivery Quale, PA-C Authorized by: Ivery Quale, PA-C   Consent:    Consent obtained:  Verbal   Consent given by:  Patient   Risks discussed:  Infection, pain and poor wound healing Anesthesia (see MAR for exact dosages):    Anesthesia method:  Local infiltration   Local anesthetic:  Lidocaine 1% w/o epi Laceration details:    Location:  Shoulder/arm   Shoulder/arm location:  L lower arm   Length (cm):  6 Repair type:    Repair type:  Intermediate Pre-procedure details:    Preparation:  Patient was prepped and draped in usual sterile fashion Exploration:    Hemostasis achieved with:  Direct pressure   Wound exploration: wound explored through full range of motion and entire depth of wound probed and visualized     Wound extent: tendon damage     Wound extent: no foreign bodies/material noted and no vascular damage noted     Tendon damage location:  Upper extremity   Upper extremity tendon damage location:  Forearm flexor   Forearm flexor tendon:  Flexor carpi ulnaris   Tendon repair plan:  Refer for evaluation Treatment:    Area cleansed with:  Betadine   Amount of cleaning:  Extensive   Irrigation solution:  Sterile saline Skin repair:    Repair method:  Staples   Number of staples:  7 Approximation:    Approximation:  Close Post-procedure details:    Dressing:  Sterile dressing   Patient tolerance of procedure:  Tolerated well, no immediate complications   (including critical care time)  Medications Ordered in ED Medications  povidone-iodine (BETADINE) 10 % external solution (has no administration in time range)  lidocaine (PF) (XYLOCAINE) 1 % injection (has no administration in time range)  Tdap (BOOSTRIX) injection 0.5 mL (0.5 mLs Intramuscular Given 02/19/18 1602)  HYDROcodone-acetaminophen (NORCO/VICODIN) 5-325 MG per tablet 2 tablet (2 tablets  Oral Given 02/19/18 1837)  doxycycline (VIBRA-TABS) tablet 100 mg (100 mg Oral Given 02/19/18 1837)  ibuprofen (ADVIL,MOTRIN) tablet 400 mg (400 mg Oral Given 02/19/18 1837)  ondansetron (ZOFRAN) tablet 4 mg (4 mg Oral Given 02/19/18 1837)     Initial Impression / Assessment and Plan / ED Course  I have reviewed the triage vital signs and the  nursing notes.  Pertinent labs & imaging results that were available during my care of the patient were reviewed by me and considered in my medical decision making (see chart for details).       Final Clinical Impressions(s) / ED Diagnoses MDM  Vital signs reviewed.  Patient has a 6 cm laceration to the wrist to forearm area on the left upper extremity.  This was irrigated thoroughly and repaired without problem.  Patient's tetanus is updated. X-ray shows no fracture or joint abnormality.  There is a soft tissue laceration of the distal anterior forearm there are 4 superficial radiopaque foreign bodies along the margin of the laceration, but no other foreign body appreciated.  Patient started on antibiotics as he has had a history of methicillin-resistant staph.  Medication for pain also given to the patient.  Patient is to follow-up with his primary physician or return to the emergency department if any signs of advancing infection, problems, or concerns.  Patient is in agreement with this plan.    I received a call from a local pharmacy concerning prescription for hydrocodone.  Review of the patient's records indicate that the patient has an opioid abuse problem.  He is currently on Suboxone.  The prescription for hydrocodone was withdrawn.  Have asked the pharmacist to asked the patient to see his Suboxone management physician if any additional pain medication as needed.   Final diagnoses:  Flexor tendon laceration of forearm with open wound, left, initial encounter  Laceration of left forearm, initial encounter    ED Discharge Orders          Ordered    doxycycline (VIBRAMYCIN) 100 MG capsule  2 times daily     02/19/18 1837    ibuprofen (ADVIL,MOTRIN) 600 MG tablet  4 times daily     02/19/18 1837    HYDROcodone-acetaminophen (NORCO/VICODIN) 5-325 MG tablet  Every 4 hours PRN     02/19/18 1837           Ivery Quale, PA-C 02/20/18 1503    Benjiman Core, MD 02/20/18 2357

## 2018-02-19 NOTE — Discharge Instructions (Signed)
You have a tendon injury to the left wrist.  Please see Dr. Romeo Apple in the office as soon as possible concerning this.  Please keep the wound clean and dry.  Please use doxycycline 2 times daily with food.  Use ibuprofen with breakfast, lunch, dinner, and at bedtime.  May use Norco for more severe pain.  Please use your splint until seen by Dr. Romeo Apple.  Your tetanus was updated today.  Please make this a part of your medical records.

## 2018-02-19 NOTE — ED Triage Notes (Signed)
Drilling with electric drill and bit hit left arm.  Rates pain 6/10.

## 2018-02-20 ENCOUNTER — Other Ambulatory Visit: Payer: Self-pay | Admitting: Student in an Organized Health Care Education/Training Program

## 2018-02-20 MED ORDER — BUPRENORPHINE HCL-NALOXONE HCL 8-2 MG SL SUBL: 1.0000 | SUBLINGUAL_TABLET | Freq: Two times a day (BID) | SUBLINGUAL | 0 refills | Status: DC

## 2018-02-20 MED FILL — IBUPROFEN 600 MG TABLET: 600 | 8 days supply | Qty: 30 | Fill #0

## 2018-02-20 MED FILL — OMEPRAZOLE 40 MG CPDR: 40 | 30 days supply | Qty: 30 | Fill #0

## 2018-02-20 MED FILL — DOXYCYCLINE HYCLATE 100 MG: 100 | 7 days supply | Qty: 14 | Fill #0

## 2018-02-20 NOTE — Progress Notes (Signed)
[facility] outpatient pharmacy only except 2-week prescriptions for Suboxone.  Patient is on 4-week follow-up with [place] in the clinic.

## 2018-02-23 ENCOUNTER — Encounter: Payer: Self-pay | Admitting: Orthopedic Surgery

## 2018-02-23 ENCOUNTER — Ambulatory Visit: Payer: Medicaid Other | Admitting: Orthopedic Surgery

## 2018-02-23 VITALS — BP 142/76 | HR 71 | Ht 71.0 in | Wt 152.0 lb

## 2018-02-23 DIAGNOSIS — S51812A Laceration without foreign body of left forearm, initial encounter: Secondary | ICD-10-CM | POA: Diagnosis not present

## 2018-02-23 NOTE — Progress Notes (Signed)
Chief Complaint  Patient presents with  . Arm Injury    left forearm injury 02/19/18     History 37 year old male injured his left forearm with a power tool sustained a volar laceration which the ER record indicates he had a flexor tendon involvement.  He has no paresthesias and no functional deficit at this time  A day or 2 later he injured the thenar eminence with another power tool with 2 puncture wounds which are also asymptomatic  He is on Vibramycin  Review of Systems  Constitutional: Negative for chills (.PHYS), fever and weight loss.  Respiratory: Negative for shortness of breath.   Cardiovascular: Negative for chest pain.  Neurological: Negative for tingling.   Past Medical History:  Diagnosis Date  . Bacteremia due to methicillin susceptible Staphylococcus aureus (MSSA) 06/06/2017  . Chest wall abscess   . Hx of septic arthritis    Left hip  . 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 Left hip postoperative wound infection   . MSSA (methicillin susceptible Staphylococcus aureus) 05/2017  . MSSA bacteremia   . Neck abscess 06/05/2017  . Normocytic anemia   . Opioid use disorder (HCC) 08/08/2017  . Opioid use disorder (HCC)   . Pneumonia   . Septic arthritis of hip (HCC) 06/06/2017  . Septic arthritis of sternoclavicular joint, right (HCC) 06/06/2017   BP (!) 142/76   Pulse 71   Ht 5\' 11"  (1.803 m)   Wt 152 lb (68.9 kg)   BMI 21.20 kg/m  Physical Exam  Constitutional: He is oriented to person, place, and time. He appears well-developed and well-nourished.  Vital signs have been reviewed and are stable. Gen. appearance the patient is well-developed and well-nourished with normal grooming and hygiene.   Neurological: He is alert and oriented to person, place, and time.  Skin: Skin is warm and dry. Capillary refill takes less than 2 seconds. No erythema.  Psychiatric: He has a normal mood and affect.  Vitals reviewed.  Examination of the left forearm shows a 5 cm  laceration approximately mid forearm.  All flexor tendons were intact ECU was palpable and intact FC R was palpable and intact median nerve was intact  2 puncture wounds were noted over the thenar eminence with no functional deficit  Encounter Diagnosis  Name Primary?  . Laceration of skin of left forearm, initial encounter Yes    Follow-up in a week continue his antibiotics until they are finished keep dry until Sunday or Monday

## 2018-02-23 NOTE — Patient Instructions (Signed)
Finish all the antibiotic  Keep the wound dry until Monday then he can take a shower  Follow-up in a week to check the wound

## 2018-02-28 ENCOUNTER — Telehealth: Payer: Self-pay | Admitting: *Deleted

## 2018-02-28 MED ORDER — BUPRENORPHINE HCL-NALOXONE HCL 8-2 MG SL FILM: 1.0000 | ORAL_FILM | Freq: Two times a day (BID) | SUBLINGUAL | 0 refills | Status: DC

## 2018-02-28 MED FILL — SUBOXONE 8 MG-2 MG SL FILM: 8-2 | 14 days supply | Qty: 28 | Fill #0

## 2018-02-28 NOTE — Telephone Encounter (Signed)
Great. Done.

## 2018-02-28 NOTE — Telephone Encounter (Signed)
Pt now has medicaid, they will only pay for strips, please submit new script and pharm will destroy 10/1 script for tablets, this is per [facility]

## 2018-03-02 ENCOUNTER — Ambulatory Visit: Payer: Medicaid Other | Admitting: Orthopedic Surgery

## 2018-03-02 ENCOUNTER — Telehealth: Payer: Self-pay | Admitting: Orthopedic Surgery

## 2018-03-02 NOTE — Telephone Encounter (Signed)
Clifford Tucker was a no show for his appointment this morning at 10:10.  I called at 10:30 and spoke to his mother.  She said that Clifford Tucker was not there, he was at work.  I asked her if she had a way to get in touch with him.  She said he would be at work until after 3:00 today.  I asked her to please have him call our office first thing on Monday morning.  She said she would do this.

## 2018-03-06 ENCOUNTER — Other Ambulatory Visit: Payer: Self-pay | Admitting: Student in an Organized Health Care Education/Training Program

## 2018-03-06 DIAGNOSIS — R768 Other specified abnormal immunological findings in serum: Secondary | ICD-10-CM | POA: Insufficient documentation

## 2018-03-13 ENCOUNTER — Ambulatory Visit (INDEPENDENT_AMBULATORY_CARE_PROVIDER_SITE_OTHER): Payer: Medicaid Other | Admitting: Internal Medicine

## 2018-03-13 DIAGNOSIS — F1120 Opioid dependence, uncomplicated: Secondary | ICD-10-CM | POA: Diagnosis present

## 2018-03-13 DIAGNOSIS — F1199 Opioid use, unspecified with unspecified opioid-induced disorder: Secondary | ICD-10-CM

## 2018-03-13 DIAGNOSIS — F1190 Opioid use, unspecified, uncomplicated: Secondary | ICD-10-CM

## 2018-03-13 DIAGNOSIS — R109 Unspecified abdominal pain: Secondary | ICD-10-CM | POA: Diagnosis not present

## 2018-03-13 DIAGNOSIS — Z791 Long term (current) use of non-steroidal anti-inflammatories (NSAID): Secondary | ICD-10-CM

## 2018-03-13 DIAGNOSIS — R1084 Generalized abdominal pain: Secondary | ICD-10-CM

## 2018-03-13 DIAGNOSIS — R768 Other specified abnormal immunological findings in serum: Secondary | ICD-10-CM | POA: Diagnosis not present

## 2018-03-13 MED ORDER — BUPRENORPHINE HCL-NALOXONE HCL 8-2 MG SL FILM: 1.0000 | ORAL_FILM | Freq: Two times a day (BID) | SUBLINGUAL | 0 refills | Status: DC

## 2018-03-13 MED FILL — SUBOXONE 8 MG-2 MG SL FILM: 8-2 | 30 days supply | Qty: 60 | Fill #0

## 2018-03-13 NOTE — Assessment & Plan Note (Signed)
From this standpoint he is doing well. He has not had any relapse and reports no breakthrough cravings or withdrawal symptoms.  His UDS was appropriate  Plan Continue suboxone 8-2mg  BID dosing Follow up in 4 weeks NCRS reviewed and appropriate U tox PRN, not done today.

## 2018-03-13 NOTE — Progress Notes (Signed)
   03/13/2018  [name] presents for follow up of opioid use disorder I have reviewed the prior induction visit, follow up visits, and telephone encounters relevant to opiate use disorder (OUD) treatment.   Current daily dose: Suboxone 16mg  daily  Date of Induction: 08/02/17  Current follow up interval, in weeks: 4 weeks  The patient has been adherent with the buprenorphine for OUD contract.   Last UDS Result: appropriate  HPI: Clifford Tucker is a 37yo man with OUD who presents for OBOT.  He is doing well. He reports no relapse and no issues in his home life.  He is working in plumbing.  He had an injury on the job which is a cut to the left forearm and he is doing well from that standpoint.  He is due to get some blood work for his HCV evaluation, but he would like to defer this to next appointment.  We reviewed to see if he would be approved for Zubsolv instead due to taste, but Suboxone is preferred on his current insurance.  He continues to have abdominal pain occasionally, but this has improved.  He continues to take goody powder and did not start omeprazole.  PHQ-9 was 12.  I assessed him for depression and he notes that he is doing fine and is not interested in medication or talk therapy.  Exam:   Vitals:   03/13/18 1115  BP: 105/61  Pulse: 77  Temp: 99.2 F (37.3 C)  TempSrc: Oral  SpO2: 96%  Weight: 155 lb 9.6 oz (70.6 kg)    General: Appears fatigued, nad Eyes: Anicteric sclerae Pulm: Breathing comfortably, no wheezing Psych: Flat affect, short answers to questions.   Assessment/Plan:  See Problem Based Charting in the Encounters Tab     Clifford Catalina, MD  03/13/2018  11:19 AM

## 2018-03-13 NOTE — Assessment & Plan Note (Signed)
He declined to stay for follow up labs, will discuss further with him at next visit.

## 2018-03-13 NOTE — Patient Instructions (Signed)
Clifford Tucker - -  Please keep taking your suboxone as prescribed.   Please come back to see [place] in 4 weeks.   Thank you!

## 2018-03-13 NOTE — Assessment & Plan Note (Signed)
Improved, however, he continues to use goody powder.  No blood in stool, no vomiting or decreased PO intake. I advised him to stop good powder, and to start omeprazole. I advised him of the risk of gastric ulcer.

## 2018-04-10 ENCOUNTER — Ambulatory Visit (INDEPENDENT_AMBULATORY_CARE_PROVIDER_SITE_OTHER): Payer: Medicaid Other | Admitting: Student in an Organized Health Care Education/Training Program

## 2018-04-10 VITALS — BP 99/57 | HR 69 | Temp 98.4°F | Resp 20 | Wt 151.0 lb

## 2018-04-10 DIAGNOSIS — F1120 Opioid dependence, uncomplicated: Secondary | ICD-10-CM

## 2018-04-10 DIAGNOSIS — R768 Other specified abnormal immunological findings in serum: Secondary | ICD-10-CM | POA: Diagnosis not present

## 2018-04-10 DIAGNOSIS — F1190 Opioid use, unspecified, uncomplicated: Secondary | ICD-10-CM

## 2018-04-10 DIAGNOSIS — F1199 Opioid use, unspecified with unspecified opioid-induced disorder: Secondary | ICD-10-CM

## 2018-04-10 MED ORDER — BUPRENORPHINE HCL-NALOXONE HCL 8-2 MG SL FILM: 1.0000 | ORAL_FILM | Freq: Two times a day (BID) | SUBLINGUAL | 0 refills | Status: DC

## 2018-04-10 MED FILL — SUBOXONE 8 MG-2 MG SL FILM: 8-2 | 30 days supply | Qty: 60 | Fill #0

## 2018-04-10 NOTE — Progress Notes (Signed)
   04/10/2018  Clifford Tucker presents for follow up of opioid use disorder I have reviewed the prior induction visit, follow up visits, and telephone encounters relevant to opiate use disorder (OUD) treatment.   Current daily dose: Suboxone 16 mg daily  Date of Induction: 08/02/17  Current follow up interval, in weeks: 4  The patient has been adherent with the buprenorphine for OUD contract.   Last UDS Result: Appropriate  HPI: 37 year old man here for follow-up of opioid use disorder.  Doing well at home.  Reports no recent relapses.  Says it has been several months since he last injected heroin.  The trauma to his left hand is healing slowly, did not require surgical correction.  He feels he has a full grip in the hand.  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  He reports good compliance with Suboxone.  Denies any adverse side effects.  Reports cravings are well controlled.  No recent fevers or chills.  No other illnesses.  No other complaints.  Exam:   Vitals:   04/10/18 1105  BP: (!) 99/57  Pulse: 69  Resp: 20  Temp: 98.4 F (36.9 C)  TempSrc: Oral  SpO2: 97%  Weight: 151 lb (68.5 kg)    General: Well-appearing young man, no distress Extremities: Warm and well-perfused, no edema, left wrist has a well-healed scar, a little swelling over the flexor tendon at the carpal tunnel, good strength of all his fingers in flexion Neuro: Alert and oriented, conversational, normal strength upper and lower extremities, normal gait Psych: Appropriate mood, appropriate affect, may be a little withdrawn, not anxious or depressed appearing.  Assessment/Plan:  See Problem Based Charting in the Encounters Tab     Clifford Tucker, Clifford Faulk Thomas, MD  04/10/2018  11:25 AM

## 2018-04-10 NOTE — Assessment & Plan Note (Signed)
Doing well, clinically stable.  No recent relapses.  Good sustained response to the Suboxone.  Plan is to continue with Suboxone 16 mg daily in divided doses.  He has good access through Medicaid coverage.  Will check tox assure today.  Follow-up in 4 weeks.

## 2018-04-10 NOTE — Assessment & Plan Note (Signed)
Clinically stable.  Awaiting lab work to confirm positive viral load and liver fibrosis.  Says he will come back and have the blood work drawn in the coming 1-2 weeks.

## 2018-04-14 LAB — TOXASSURE SELECT,+ANTIDEPR,UR

## 2018-04-16 ENCOUNTER — Telehealth: Payer: Self-pay | Admitting: Orthopedic Surgery

## 2018-04-16 NOTE — Telephone Encounter (Signed)
No I dont treat the problem he has

## 2018-04-16 NOTE — Telephone Encounter (Signed)
Call received from patient, states he needs to schedule an appointment for left wrist/forearm, for which he had been seen by Dr Romeo AppleHarrison 02/23/18, [facility].  Patient was to follow up in 1 week, however, he did not return for the visit. States he went to see an [facility]edic doctor at [facility] for a consultation, and said was told that he sees the records from Dr Romeo AppleHarrison, and that he agrees with Dr Romeo AppleHarrison.  Patient said he "might as well come back to see Dr Romeo AppleHarrison"; said his wrist is red, and he has a lump underneath where injury was; said he knows he cut through the tendon. Please advise.

## 2018-04-16 NOTE — Telephone Encounter (Signed)
Called back to patient; relayed. Patient is asking further questions about his diagnosis. I relayed I will have to have patient speak with clinical staff and placed patient on hold for a moment, and he hung up.  Please call to advise.

## 2018-04-16 NOTE — Telephone Encounter (Signed)
ER record indicates he had a flexor tendon involvement.  Dr Romeo AppleHarrison did not feel he did. He went to [facility] as a consult, Dr Romeo AppleHarrison wants to have this note sent to us for review  I have asked him to have them send to us.

## 2018-05-08 ENCOUNTER — Ambulatory Visit (INDEPENDENT_AMBULATORY_CARE_PROVIDER_SITE_OTHER): Payer: Medicaid Other | Admitting: Student in an Organized Health Care Education/Training Program

## 2018-05-08 VITALS — BP 98/66 | HR 92 | Temp 98.8°F | Wt 146.8 lb

## 2018-05-08 DIAGNOSIS — S66802A Unspecified injury of other specified muscles, fascia and tendons at wrist and hand level, left hand, initial encounter: Secondary | ICD-10-CM | POA: Insufficient documentation

## 2018-05-08 DIAGNOSIS — F1190 Opioid use, unspecified, uncomplicated: Secondary | ICD-10-CM

## 2018-05-08 DIAGNOSIS — F1120 Opioid dependence, uncomplicated: Secondary | ICD-10-CM

## 2018-05-08 DIAGNOSIS — S66802D Unspecified injury of other specified muscles, fascia and tendons at wrist and hand level, left hand, subsequent encounter: Secondary | ICD-10-CM | POA: Diagnosis not present

## 2018-05-08 DIAGNOSIS — X58XXXD Exposure to other specified factors, subsequent encounter: Secondary | ICD-10-CM

## 2018-05-08 DIAGNOSIS — R768 Other specified abnormal immunological findings in serum: Secondary | ICD-10-CM

## 2018-05-08 DIAGNOSIS — F1199 Opioid use, unspecified with unspecified opioid-induced disorder: Secondary | ICD-10-CM

## 2018-05-08 HISTORY — DX: Unspecified injury of other specified muscles, fascia and tendons at wrist and hand level, left hand, initial encounter: S66.802A

## 2018-05-08 MED ORDER — BUPRENORPHINE HCL-NALOXONE HCL 8-2 MG SL FILM: 1.0000 | ORAL_FILM | Freq: Two times a day (BID) | SUBLINGUAL | 0 refills | Status: DC

## 2018-05-08 MED FILL — SUBOXONE 8 MG-2 MG SL FILM: 8-2 | 30 days supply | Qty: 60 | Fill #0

## 2018-05-08 NOTE — Assessment & Plan Note (Addendum)
Stable, seems to be doing well.  Urine substance test have been appropriate x3.  Plan is to continue with Suboxone 16 mg daily.  Follow-up in 4 weeks.

## 2018-05-08 NOTE — Progress Notes (Signed)
   05/08/2018  Clifford Tucker presents for follow up of opioid use disorder I have reviewed the prior induction visit, follow up visits, and telephone encounters relevant to opiate use disorder (OUD) treatment.   Current daily dose: Suboxone 16 mg daily  Date of Induction: 08/02/17  Current follow up interval, in weeks: 4  The patient has been adherent with the buprenorphine for OUD contract.   Last UDS Result: Appropriate x83  HPI: 37 year old man here for follow-up of opioid use disorder.  He is doing well, reports good compliance with Suboxone, denies any side effects.  Says his cravings are well controlled.  Denies any relapses.  Not using any drugs besides marijuana.  His fiance recently learned that she is pregnant.  Continues to struggle after the injury to his left hand.  Feels his hand is too weak has not been able to to return to work still having significant tenderness flexor tendons.  Exam:   Vitals:   05/08/18 1126  BP: 98/66  Pulse: 92  Temp: 98.8 F (37.1 C)  TempSrc: Oral  SpO2: 98%  Weight: 146 lb 12.8 oz (66.6 kg)    General: Well-appearing man, no distress Heart regular rate and rhythm, no murmurs Extremities he has a well-healed scar in the mid forearm swelling at the carpal tunnel, tenderness of the flexor tendons there, he has strength in his fingers but it is tender, no swelling of the hand. No skin or soft tissue infections Neuro exam is normal, conversational, full strength, normal gait  Assessment/Plan:  See Problem Based Charting in the Encounters Tab     Clifford Tucker, Clifford Jarrells Thomas, MD  05/08/2018  2:37 PM

## 2018-05-08 NOTE — Assessment & Plan Note (Signed)
Patient suffered an injury to his left wrist his left his hand week.  He followed up with [facility] surgery and I do not have any notes from their office but it seems like they have elected for nonsurgical supportive care.  Patient is dissatisfied with this plan of care.  He still feels his hand is too weak to work.  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  He is fixated on the worry that there is a tendon that is ruptured and needs repair.  I am going to refer him to hand surgery at [facility] for a second opinion.

## 2018-05-10 LAB — HIV ANTIBODY (ROUTINE TESTING W REFLEX): HIV Screen 4th Generation wRfx: NONREACTIVE

## 2018-05-10 LAB — HEPATITIS B SURFACE ANTIBODY,QUALITATIVE: Hep B Surface Ab, Qual: REACTIVE

## 2018-05-10 LAB — HCV FIBROSURE
ALPHA 2-MACROGLOBULINS, QN: 206 mg/dL (ref 110–276)
ALT (SGPT) P5P: 138 IU/L — AB (ref 0–55)
APOLIPOPROTEIN A I: 124 mg/dL (ref 101–178)
BILIRUBIN, TOTAL: 0.5 mg/dL (ref 0.0–1.2)
Fibrosis Score: 0.36 — ABNORMAL HIGH (ref 0.00–0.21)
GGT: 106 IU/L — ABNORMAL HIGH (ref 0–65)
HAPTOGLOBIN: 97 mg/dL (ref 17–317)
Necroinflammat Activity Score: 0.71 — ABNORMAL HIGH (ref 0.00–0.17)

## 2018-05-11 LAB — HEPATITIS C GENOTYPE

## 2018-05-11 LAB — HCV RNA QUANT
HCV LOG10: 5.959 {Log_IU}/mL
HEPATITIS C QUANTITATION: 910000 [IU]/mL

## 2018-06-05 ENCOUNTER — Ambulatory Visit (INDEPENDENT_AMBULATORY_CARE_PROVIDER_SITE_OTHER): Payer: Medicaid Other | Admitting: Internal Medicine

## 2018-06-05 DIAGNOSIS — S66892D Other injury of other specified muscles, fascia and tendons at wrist and hand level, left hand, subsequent encounter: Secondary | ICD-10-CM

## 2018-06-05 DIAGNOSIS — F1120 Opioid dependence, uncomplicated: Secondary | ICD-10-CM | POA: Diagnosis not present

## 2018-06-05 DIAGNOSIS — S66802D Unspecified injury of other specified muscles, fascia and tendons at wrist and hand level, left hand, subsequent encounter: Secondary | ICD-10-CM

## 2018-06-05 DIAGNOSIS — X58XXXD Exposure to other specified factors, subsequent encounter: Secondary | ICD-10-CM

## 2018-06-05 DIAGNOSIS — F1199 Opioid use, unspecified with unspecified opioid-induced disorder: Secondary | ICD-10-CM

## 2018-06-05 DIAGNOSIS — R768 Other specified abnormal immunological findings in serum: Secondary | ICD-10-CM | POA: Diagnosis not present

## 2018-06-05 DIAGNOSIS — F1190 Opioid use, unspecified, uncomplicated: Secondary | ICD-10-CM

## 2018-06-05 MED ORDER — BUPRENORPHINE HCL-NALOXONE HCL 8-2 MG SL FILM: 1.0000 | ORAL_FILM | Freq: Two times a day (BID) | SUBLINGUAL | 0 refills | Status: DC

## 2018-06-05 MED FILL — SUBOXONE 8 MG-2 MG SL FILM: 8-2 | 30 days supply | Qty: 60 | Fill #0

## 2018-06-05 NOTE — Progress Notes (Signed)
   06/05/2018  [name] presents for follow up of opioid use disorder I have reviewed the prior induction visit, follow up visits, and telephone encounters relevant to opiate use disorder (OUD) treatment.   Current daily dose: Suboxone 16mg  daily  Date of Induction: 08/02/17  Current follow up interval, in weeks: 4 weeks  The patient has been adherent with the buprenorphine for OUD contract.   Last UDS Result: + for Bupe and THC  HPI: Clifford Tucker is a 38 year old man with OUD who presents for treatment.  He is doing well, no substance use since last being seen (occasional marijuana only).  He had an injury at work and is still recovering.  He is very worried about not being able to work again.  We have referred him for another opinion at [facility] and this referral is pending.  He was very sleepy today and had low resting blood pressure which improved with activity.  He appears to have low resting blood pressure at baseline.  He denied fever, chills, dizziness, LOC, chest pain or any other symptoms.     Exam:   Vitals:   06/05/18 1124  BP: (!) 89/53  Pulse: 73  Temp: 98.1 F (36.7 C)  TempSrc: Oral  SpO2: 99%  Weight: 151 lb 4.8 oz (68.6 kg)    General: Thin gentleman, somewhat groggy today Eyes: Anicteric sclerae, no injection Pulm: Breathing comfortably MSK: He has a nodular area on the anterior forearm at the wrist, healing scar anterior to that Psych: Groggy, but alert and able to answer questions appropriately.  Assessment/Plan:  See Problem Based Charting in the Encounters Tab     Clifford Catalina, MD  06/05/2018  11:39 AM

## 2018-06-05 NOTE — Patient Instructions (Signed)
Clifford Tucker - -  You are doing well.   We will call you with the results of the referral to hand surgery.   Please come back in 4 weeks.

## 2018-06-08 NOTE — Assessment & Plan Note (Signed)
[facility] at [facility].  I asked nursing staff to follow up and see if this can be expedited.

## 2018-06-08 NOTE — Assessment & Plan Note (Signed)
Reviewed previous labs.  Will need to discuss whether he would like to start treatment at next visit.

## 2018-06-08 NOTE — Assessment & Plan Note (Signed)
He is doing well.  No acute issues today.  Cravings controlled at this time.   Plan Continue buprenorphine 8-2mg  BID UDS at next visit PDMP reviewed and appropriate Follow up in 4 weeks.

## 2018-07-03 ENCOUNTER — Telehealth: Payer: Self-pay | Admitting: *Deleted

## 2018-07-03 ENCOUNTER — Ambulatory Visit (INDEPENDENT_AMBULATORY_CARE_PROVIDER_SITE_OTHER): Payer: Medicaid Other | Admitting: Internal Medicine

## 2018-07-03 VITALS — BP 95/58 | HR 67 | Wt 147.9 lb

## 2018-07-03 DIAGNOSIS — F1190 Opioid use, unspecified, uncomplicated: Secondary | ICD-10-CM

## 2018-07-03 DIAGNOSIS — B182 Chronic viral hepatitis C: Secondary | ICD-10-CM | POA: Insufficient documentation

## 2018-07-03 DIAGNOSIS — F1199 Opioid use, unspecified with unspecified opioid-induced disorder: Secondary | ICD-10-CM

## 2018-07-03 DIAGNOSIS — Z79891 Long term (current) use of opiate analgesic: Secondary | ICD-10-CM

## 2018-07-03 MED ORDER — BUPRENORPHINE HCL-NALOXONE HCL 8-2 MG SL FILM: 1.0000 | ORAL_FILM | Freq: Two times a day (BID) | SUBLINGUAL | 0 refills | Status: DC

## 2018-07-03 MED ORDER — GLECAPREVIR-PIBRENTASVIR 100-40 MG PO TABS: 3.0000 | ORAL_TABLET | Freq: Every day | ORAL | 1 refills | Status: AC

## 2018-07-03 MED FILL — SUBOXONE 8 MG-2 MG SL FILM: 8-2 | 30 days supply | Qty: 60 | Fill #0

## 2018-07-03 NOTE — Assessment & Plan Note (Signed)
We will continue him on Suboxone 8-2 mg twice daily films. He will follow-up in 4 weeks.

## 2018-07-03 NOTE — Telephone Encounter (Signed)
APPOINTMENT RESCHEDULED FOR [facility] 2.2020 @ 2:15PM ALSO MAILING THIS INFORMATION APPOINTMENT TO PATIENT.

## 2018-07-03 NOTE — Progress Notes (Signed)
   07/03/2018  Clifford Tucker presents for follow up of opioid use disorder I have reviewed the prior induction visit, follow up visits, and telephone encounters relevant to opiate use disorder (OUD) treatment.   Current daily dose: Suboxone 16mg  daily  Date of Induction: 08/02/17  Current follow up interval, in weeks: 4 weeks  The patient has been adherent with the buprenorphine for OUD contract.   Last UDS Result: + for Bupe and THC  HPI: Clifford Tucker is a 38 year old man with OUD who presents for treatment.  Overall he reports he is doing well on Suboxone therapy the 16 mg dose continues to control cravings well has had no illicit opioid use in the interim time. We also discussed his hepatitis C results and that our like to get him therapy he did complete the rating is to treat form that Medicaid requires today.  I think he would be a good candidate for 8 weeks of Maverick.  We will work on getting this prior authorization done.  I would recommend obtaining repeat CBC and CMP however he has no stigmata of cirrhosis and on last check these were essentially normal.   Exam:   Vitals:   07/03/18 1050  BP: (!) 95/58  Pulse: 67  SpO2: 98%  Weight: 147 lb 14.4 oz (67.1 kg)    General: NAD Eyes: Anicteric sclerae, no injection Pulm: Breathing comfortably Skin: no stigmata of recent injection Psych: Mildly anxious appearing.  Assessment/Plan:  See Problem Based Charting in the Encounters Tab     Gust Rung, DO  07/03/2018  3:48 PM

## 2018-07-03 NOTE — Assessment & Plan Note (Signed)
He is doing well on MAT and has not had IV injection abuse in a number of months.  He is ready for treatment for his genotype 2 chronic hepatitis C infection I discussed this with him today.  I would like to start him on Maverick 3 tablets daily for 8 weeks.  We will work on prior authorization for this. I would also like to update his CBC CMP and INR.

## 2018-07-31 ENCOUNTER — Other Ambulatory Visit: Payer: Self-pay | Admitting: *Deleted

## 2018-07-31 ENCOUNTER — Ambulatory Visit (INDEPENDENT_AMBULATORY_CARE_PROVIDER_SITE_OTHER): Payer: Medicaid Other | Admitting: Internal Medicine

## 2018-07-31 ENCOUNTER — Telehealth: Payer: Self-pay

## 2018-07-31 VITALS — BP 130/84 | HR 81 | Temp 99.6°F | Wt 142.5 lb

## 2018-07-31 DIAGNOSIS — Z79899 Other long term (current) drug therapy: Secondary | ICD-10-CM | POA: Diagnosis not present

## 2018-07-31 DIAGNOSIS — R451 Restlessness and agitation: Secondary | ICD-10-CM

## 2018-07-31 DIAGNOSIS — F1120 Opioid dependence, uncomplicated: Secondary | ICD-10-CM | POA: Diagnosis present

## 2018-07-31 DIAGNOSIS — F1199 Opioid use, unspecified with unspecified opioid-induced disorder: Secondary | ICD-10-CM

## 2018-07-31 DIAGNOSIS — F411 Generalized anxiety disorder: Secondary | ICD-10-CM | POA: Diagnosis not present

## 2018-07-31 DIAGNOSIS — F1190 Opioid use, unspecified, uncomplicated: Secondary | ICD-10-CM

## 2018-07-31 MED ORDER — DIAZEPAM 5 MG PO TABS: 5.0000 mg | ORAL_TABLET | Freq: Two times a day (BID) | ORAL | 0 refills | Status: DC | PRN

## 2018-07-31 MED ORDER — BUPRENORPHINE HCL-NALOXONE HCL 8-2 MG SL FILM: 1.0000 | ORAL_FILM | Freq: Two times a day (BID) | SUBLINGUAL | 0 refills | Status: DC

## 2018-07-31 MED ORDER — ESCITALOPRAM OXALATE 10 MG PO TABS: 10.0000 mg | ORAL_TABLET | Freq: Every day | ORAL | 2 refills | Status: DC

## 2018-07-31 MED FILL — ESCITALOPRAM 10 MG TABLET: 10 | 30 days supply | Qty: 30 | Fill #0

## 2018-07-31 MED FILL — SUBOXONE 8 MG-2 MG SL FILM: 8-2 | 30 days supply | Qty: 60 | Fill #0

## 2018-07-31 NOTE — Telephone Encounter (Signed)
Spoke w/ sig other sent new encounter

## 2018-07-31 NOTE — Telephone Encounter (Signed)
diazepam (VALIUM) 5 MG tablet, refill request @  [facility] - [place], [place] - [address]. [phone] (Phone) [phone] (Fax)

## 2018-08-01 ENCOUNTER — Telehealth: Payer: Self-pay | Admitting: *Deleted

## 2018-08-01 MED ORDER — ESCITALOPRAM OXALATE 10 MG PO TABS: 10.0000 mg | ORAL_TABLET | Freq: Every day | ORAL | 2 refills | Status: DC

## 2018-08-01 MED ORDER — DIAZEPAM 5 MG PO TABS: 5.0000 mg | ORAL_TABLET | Freq: Two times a day (BID) | ORAL | 0 refills | Status: DC | PRN

## 2018-08-01 NOTE — Telephone Encounter (Signed)
[facility], also sent Rx of Lexapro, as I instructed Clifford Tucker that valium is a bridge for anxiety while starting lexapro.

## 2018-08-01 NOTE — Telephone Encounter (Signed)
Call to [employer] for PA for Diazepam 5 mg tablets.  No PA is needed.  Diazepam will react with another will react with another medication that the patient is taking.  Need a call from the patient's pharmacy to get the approval for the Diazepam.  Call to [facility] to have the Pharmacist to call [employer] for the approval. Pharmacist to do. Angelina Ok, RN 08/01/2018 10:00 AM.

## 2018-08-03 NOTE — Assessment & Plan Note (Signed)
As noted this visit was predominantly dominated by his anxiety.  Overall and otherwise he seems to be doing reasonably well with Suboxone.  I will check a urine tox screen today and also have him continue Suboxone 8-2 mg twice daily

## 2018-08-03 NOTE — Progress Notes (Signed)
   08/03/2018  Clifford Tucker presents for follow up of opioid use disorder I have reviewed the prior induction visit, follow up visits, and telephone encounters relevant to opiate use disorder (OUD) treatment.   Current daily dose: Suboxone 16mg  daily  Date of Induction: 08/02/17  Current follow up interval, in weeks: 4 weeks  The patient has been adherent with the buprenorphine for OUD contract.   Last UDS Result: + for Bupe and THC  HPI: Clifford Tucker is a 38 year old man with OUD who presents for treatment.  Clifford Tucker is very agitated on my entering the room he was frustrated that he was separated from his girlfriend and to a separate room and felt that that is not our usual policy.  He is struggled with anxiety in the past and he appears very anxious today.  It actually took several minutes for him to calm down enough to talk to me.  He emphatically denies any depression or any suicidal ideation.  He does admit to anxiety he reports that he feels like his life is going nowhere.  Anxiety is worsened by his girlfriend being pregnant and the thought of this new baby coming into the world. He does report that he is using Suboxone it is controlling his cravings he would like to continue on MAT.  Exam:   Vitals:   07/31/18 1058  BP: 130/84  Pulse: 81  Temp: 99.6 F (37.6 C)  TempSrc: Oral  SpO2: 99%  Weight: 142 lb 8 oz (64.6 kg)    General: Anxious, frustrated, hit exam table after entering room Eyes: Anicteric sclerae, no injection Pulm: Breathing comfortably  Skin: no stigmata of recent injection on gross exam Psych:Very anxious appearing.  Assessment/Plan:  See Problem Based Charting in the Encounters Tab     Gust Rung, DO  08/03/2018  3:24 PM

## 2018-08-03 NOTE — Assessment & Plan Note (Addendum)
His anxiety is clearly affecting his overall care.  He is not gotten the appropriate labs for his hepatitis C.  He usually leaves our visit to go straight to the car to wait for his girlfriend.  As he does not like to spend extra time in the office.  Today he was not threatening directly to any staff but in a very anxious mood I told him that he cannot continue like this and he must be calm and allows to help him. I discussed the nature of general anxiety disorder and goals of treatment.  I discussed that he should start an SSRI.  He is concerned that the medications have talked about her depression pills and he is not depressed.  I reiterated that this is actually the preferred treatment for anxiety.  He reports he would like Xanax or Valium.  I discussed that these medications can be more dangerous with Suboxone therapy and are not good long-term treatment.  I discussed that I would be willing to give him a limited Rx of Valium that would be a bridge to more permanent therapy.  I discussed this therapy would be Lexapro.  He was willing to give this a try. I prescribed Lexapro 10 mg daily Also start Valium 5 mg twice daily-is not to be a long-term medication only a bridge to starting therapy.  Fortunately I was told by my nursing staff after the visit he went to the outpatient pharmacy to cut this medication where he was aggressive he is no longer able to fill his medications there.  [facility]

## 2018-08-06 ENCOUNTER — Telehealth: Payer: Self-pay | Admitting: *Deleted

## 2018-08-06 LAB — TOXASSURE SELECT,+ANTIDEPR,UR

## 2018-08-06 NOTE — Addendum Note (Signed)
Addended by: Neomia Dear on: 08/06/2018 02:56 PM   Modules accepted: Orders

## 2018-08-06 NOTE — Telephone Encounter (Signed)
SPOKE WITH MR Folson. PATIENT STATES THAT HE IS NOT GETTING THE HELP /  RESPONSES FROM DIFFERENT DR. HE HAS SEEN. SEEMS THAT NO BODY CAN HELP HIM, HE SATES. REQUEST. PATIENT REQUEST REFERRAL BE CANCELED.

## 2018-08-29 ENCOUNTER — Telehealth: Payer: Self-pay | Admitting: Internal Medicine

## 2018-08-29 MED ORDER — BUPRENORPHINE HCL-NALOXONE HCL 8-2 MG SL FILM: 1.0000 | ORAL_FILM | Freq: Two times a day (BID) | SUBLINGUAL | 0 refills | Status: DC

## 2018-08-29 MED FILL — SUBOXONE 8 MG-2 MG SL FILM: 8-2 | 30 days supply | Qty: 60 | Fill #0

## 2018-08-29 NOTE — Telephone Encounter (Signed)
Called and spoke with Clifford Tucker about his medication.  Confirmed identity with name and DOB.   Clifford Tucker reports that he is doing well, no complaints, no issues and no recent drug use.  Cravings and withdrawal controlled on buprenorphine.   Plan Refill Buprenorphine, advised him to pick up Rx at [facility] pharmacy  Debe Coder, MD

## 2018-08-30 ENCOUNTER — Other Ambulatory Visit: Payer: Self-pay | Admitting: *Deleted

## 2018-08-30 ENCOUNTER — Other Ambulatory Visit: Payer: Self-pay | Admitting: Internal Medicine

## 2018-08-30 MED ORDER — DIAZEPAM 5 MG PO TABS: 5.0000 mg | ORAL_TABLET | Freq: Two times a day (BID) | ORAL | 0 refills | Status: DC | PRN

## 2018-08-30 MED ORDER — ESCITALOPRAM OXALATE 10 MG PO TABS: 10.0000 mg | ORAL_TABLET | Freq: Every day | ORAL | 2 refills | Status: DC

## 2018-08-30 NOTE — Telephone Encounter (Signed)
One time refill, will need to be discussed at next in person visit as we did not discuss it over the phone.  Thanks!

## 2018-09-11 NOTE — Addendum Note (Signed)
Addended by: Bufford Spikes on: 09/11/2018 12:19 PM   Modules accepted: Orders

## 2018-09-25 ENCOUNTER — Other Ambulatory Visit: Payer: Self-pay | Admitting: Internal Medicine

## 2018-09-25 NOTE — Telephone Encounter (Signed)
I thought he could no longer fill at our outpatient pharmacies due to previous outburst?

## 2018-09-26 ENCOUNTER — Other Ambulatory Visit: Payer: Self-pay | Admitting: Internal Medicine

## 2018-09-26 MED ORDER — BUPRENORPHINE HCL-NALOXONE HCL 8-2 MG SL FILM: 1.0000 | ORAL_FILM | Freq: Two times a day (BID) | SUBLINGUAL | 0 refills | Status: DC

## 2018-09-26 MED ORDER — DIAZEPAM 5 MG PO TABS: 5.0000 mg | ORAL_TABLET | Freq: Two times a day (BID) | ORAL | 0 refills | Status: DC | PRN

## 2018-09-26 MED FILL — SUBOXONE 8 MG-2 MG SL FILM: 8-2 | 30 days supply | Qty: 60 | Fill #0

## 2018-09-26 NOTE — Telephone Encounter (Signed)
Spoke with chris, reports doing well, no relapses, cravings well controlled.  Anxiety is much better with valium, he has tried lexapro but has not been taking consistently, no known side effects but also not sure it is effective. I encouraged consistent use of lexapro to see if there is a larger benefit.

## 2018-10-19 ENCOUNTER — Other Ambulatory Visit: Payer: Self-pay | Admitting: Internal Medicine

## 2018-10-25 ENCOUNTER — Encounter: Payer: Self-pay | Admitting: Internal Medicine

## 2018-10-26 ENCOUNTER — Encounter: Payer: Self-pay | Admitting: Internal Medicine

## 2018-10-26 ENCOUNTER — Ambulatory Visit (INDEPENDENT_AMBULATORY_CARE_PROVIDER_SITE_OTHER): Payer: Medicaid Other | Admitting: Internal Medicine

## 2018-10-26 ENCOUNTER — Other Ambulatory Visit: Payer: Self-pay

## 2018-10-26 ENCOUNTER — Other Ambulatory Visit: Payer: Self-pay | Admitting: Internal Medicine

## 2018-10-26 DIAGNOSIS — F1190 Opioid use, unspecified, uncomplicated: Secondary | ICD-10-CM

## 2018-10-26 DIAGNOSIS — F1120 Opioid dependence, uncomplicated: Secondary | ICD-10-CM

## 2018-10-26 DIAGNOSIS — F1199 Opioid use, unspecified with unspecified opioid-induced disorder: Secondary | ICD-10-CM

## 2018-10-26 DIAGNOSIS — F411 Generalized anxiety disorder: Secondary | ICD-10-CM

## 2018-10-26 MED ORDER — DIAZEPAM 5 MG PO TABS: 5.0000 mg | ORAL_TABLET | Freq: Two times a day (BID) | ORAL | 0 refills | Status: DC | PRN

## 2018-10-26 MED ORDER — ESCITALOPRAM OXALATE 10 MG PO TABS: 10.0000 mg | ORAL_TABLET | Freq: Every day | ORAL | 2 refills | Status: DC

## 2018-10-26 MED ORDER — BUPRENORPHINE HCL-NALOXONE HCL 8-2 MG SL FILM: 1.0000 | ORAL_FILM | Freq: Two times a day (BID) | SUBLINGUAL | 0 refills | Status: DC

## 2018-10-26 MED FILL — SUBOXONE 8 MG-2 MG SL FILM: 8-2 | 30 days supply | Qty: 60 | Fill #0

## 2018-10-26 NOTE — Assessment & Plan Note (Addendum)
Mr. Fromm was prescribed Lexapro and Valium in March. He states he took the Lexapro for a few days, but didn't think it was working, so he discontinued. He is now only taking the Valium. He takes it every day as a preventive measure for anxiety. He states his anxiety has improved since starting the Valium, but he still has good and bad days. He denies sedation with the Valium. He is resistant to resuming the Lexapro for maintenance therapy with the eventual goal of discontinuing the Valium, which is meant to serve as a bridge until the Lexapro is therapeutic. He states he doesn't like to take a lot of medications and he knows the Lexapro works, so he would prefer to continue with only the Valium. I attempted to educate the patient about the medications and our treatment goal. He sounded frustrated over the phone and told me that he would take whatever I sent to the pharmacy before terminating the conversation.   Plan - Refill both Lexapro 10mg  daily and Valium 5mg  daily - Consider behavioral health evaluation in the future if patient is amenable

## 2018-10-26 NOTE — Assessment & Plan Note (Signed)
Patient continues to do well on Suboxone therapy. He denies cravings, relapses, or constipation.   Plan - Dr. Criselda Peaches to refill his Suboxone 8-2mg  twice daily

## 2018-10-26 NOTE — Telephone Encounter (Signed)
Can you please place this patient on the schedule for a telehealth visit today.   Thank you!

## 2018-10-26 NOTE — Progress Notes (Addendum)
   This is a telephone encounter between Clifford Tucker and Clifford Tucker on 10/26/2018 for anxiety and suboxone therapy. The visit was conducted with the patient located at home and [name]. The patient's identity was confirmed using their DOB and current address. The patient has consented to being evaluated through a telephone encounter and understands the associated risks/benefits. I personally spent 7 minutes on medical discussion.   HPI:   Mr.Clifford Tucker is a 37 y.o. male with the medical conditions listed below. He was scheduled for a televisit to discuss his opioid use disorder and anxiety prior to refilling his medications. Please see problem based charting for the history and status of the patient's current and chronic medical conditions.   Past Medical History:  Diagnosis Date  . Bacteremia due to methicillin susceptible Staphylococcus aureus (MSSA) 06/06/2017  . Chest wall abscess   . Hx of septic arthritis    Left hip  . 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 Left hip postoperative wound infection   . MSSA (methicillin susceptible Staphylococcus aureus) 05/2017  . MSSA bacteremia   . Neck abscess 06/05/2017  . Normocytic anemia   . Opioid use disorder (HCC) 08/08/2017  . Opioid use disorder (HCC)   . Pneumonia   . Septic arthritis of hip (HCC) 06/06/2017  . Septic arthritis of sternoclavicular joint, right (HCC) 06/06/2017    Review of Systems:   Pertinent positives mentioned in HPI. Remainder of all ROS negative.   Assessment & Plan:   Patient discussed with Dr. Criselda Peaches

## 2018-10-29 NOTE — Progress Notes (Signed)
Internal Medicine Clinic Attending  Case discussed with Dr. Dorrell at the time of the visit.  We reviewed the resident's history, telephone conversation and pertinent patient test results.  I agree with the assessment, diagnosis, and plan of care documented in the resident's note.   

## 2018-11-16 ENCOUNTER — Telehealth: Payer: Self-pay

## 2018-11-16 NOTE — Telephone Encounter (Signed)
Will speak w/ dr Angelia Mould

## 2018-11-16 NOTE — Telephone Encounter (Signed)
Pt's wife requesting an OUD appt for her husband this Tuesday. Please call back.

## 2018-11-16 NOTE — Telephone Encounter (Signed)
Spoke to Judson Roch again, she will be admitted and give birth Sunday, either VBAC or c/s. [facility]. Will speak to her Monday. Pt will come at the time assigned sarah.

## 2018-11-16 NOTE — Telephone Encounter (Signed)
Dr Daryll Drown, there are 6 booked in clinic this tues, sarah sig other has appt, can we add pt or put him at same time as sig other?

## 2018-11-20 ENCOUNTER — Other Ambulatory Visit: Payer: Self-pay

## 2018-11-20 ENCOUNTER — Ambulatory Visit (INDEPENDENT_AMBULATORY_CARE_PROVIDER_SITE_OTHER): Payer: Medicaid Other | Admitting: Internal Medicine

## 2018-11-20 DIAGNOSIS — B182 Chronic viral hepatitis C: Secondary | ICD-10-CM | POA: Diagnosis not present

## 2018-11-20 DIAGNOSIS — S66902D Unspecified injury of unspecified muscle, fascia and tendon at wrist and hand level, left hand, subsequent encounter: Secondary | ICD-10-CM

## 2018-11-20 DIAGNOSIS — F411 Generalized anxiety disorder: Secondary | ICD-10-CM

## 2018-11-20 DIAGNOSIS — F1120 Opioid dependence, uncomplicated: Secondary | ICD-10-CM

## 2018-11-20 DIAGNOSIS — F1190 Opioid use, unspecified, uncomplicated: Secondary | ICD-10-CM

## 2018-11-20 DIAGNOSIS — S66802D Unspecified injury of other specified muscles, fascia and tendons at wrist and hand level, left hand, subsequent encounter: Secondary | ICD-10-CM

## 2018-11-20 DIAGNOSIS — F1199 Opioid use, unspecified with unspecified opioid-induced disorder: Secondary | ICD-10-CM

## 2018-11-20 DIAGNOSIS — X58XXXD Exposure to other specified factors, subsequent encounter: Secondary | ICD-10-CM

## 2018-11-20 MED ORDER — BUPRENORPHINE HCL-NALOXONE HCL 8-2 MG SL FILM: 1.0000 | ORAL_FILM | Freq: Two times a day (BID) | SUBLINGUAL | 0 refills | Status: DC

## 2018-11-20 NOTE — Assessment & Plan Note (Signed)
Improved, he is now able to work.  Scar is healing.

## 2018-11-20 NOTE — Progress Notes (Signed)
   11/20/2018  Clifford Tucker presents for follow up of opioid use disorder I have reviewed the prior induction visit, follow up visits, and telephone encounters relevant to opiate use disorder (OUD) treatment.   Current daily dose: 16mg  daily  Date of Induction: 08/02/17  Current follow up interval, in weeks: 4 weeks  The patient has been adherent with the buprenorphine for OUD contract.   Last UDS Result: + for buprenorphine and metab of cocaine (previous use)  HPI: Clifford Tucker is a 38 year old man who presents for office based treatment of OUD with buprenorphine.  Clifford Tucker had a baby girl yesterday with his wife.  He was very excited to show [place] pictures of the baby, Clifford Tucker.  She is doing well.  He notes that he has not used any substances since last being seen.  He has struggled with anxiety and depression the last year and gotten frustrated with this team at times, but today he is in a very good mood.  His PHQ-9 was 4.  He has stopped the ativan and lexapro. He has no complaints today.  He was not able to give a urine sample, will need updated UDS at next visit.   Exam:   Vitals:   11/20/18 1026  BP: (!) 94/54  Pulse: 60  Temp: 99.1 F (37.3 C)  TempSrc: Oral  SpO2: 95%  Weight: 144 lb 6.4 oz (65.5 kg)    General: Pleasant, positive mood HENT: Wearing a mask MSK: Wound on left wrist well healed.   Assessment/Plan:  See Problem Based Charting in the Encounters Tab     Sid Falcon, MD  11/20/2018  10:37 AM

## 2018-11-20 NOTE — Assessment & Plan Note (Signed)
Will follow up on [place] with Pharmacy.  He is ready to start treatment.  May wait until he is stable at home with the new baby and family.

## 2018-11-20 NOTE — Assessment & Plan Note (Signed)
This is improved.  He stated that he has a new outlook on life and working has helped.  He has a low PHQ-9 today.  He is no longer taking ativan or lexapro.  Will remove them from his list today.   Follow up at next visit, resolve if still doing well.

## 2018-11-20 NOTE — Telephone Encounter (Signed)
Pt on his way to appt per dr Daryll Drown

## 2018-11-20 NOTE — Assessment & Plan Note (Addendum)
Doing well today.  He reports no use of substances other than prescribed.  No cravings.  He is working and looking for more steady work now that his injury has healed.  Unable to give urine sample, will do UDS at next visit.   Plan Continue buprenoephine 8mg  BID, refill sent to pharmacy UDS next visit Follow up 4 weeks PDMP reviewed, appropriate.

## 2018-11-20 NOTE — Patient Instructions (Signed)
Mr. Graumann - -   Thank you for coming in to see [place] today.   Please come back to see [place] in 4 weeks.    Congratulations on the new baby!!

## 2018-11-26 MED FILL — SUBOXONE 8 MG-2 MG SL FILM: 8-2 | 30 days supply | Qty: 60 | Fill #0

## 2018-12-02 IMAGING — DX DG ELBOW COMPLETE 3+V*R*
4 series · 4 of 4 positions shown · non-contrast
Comparison: None.

CLINICAL DATA: Fall

EXAM:
RIGHT ELBOW - COMPLETE 3+ VIEW

[elbow ap]
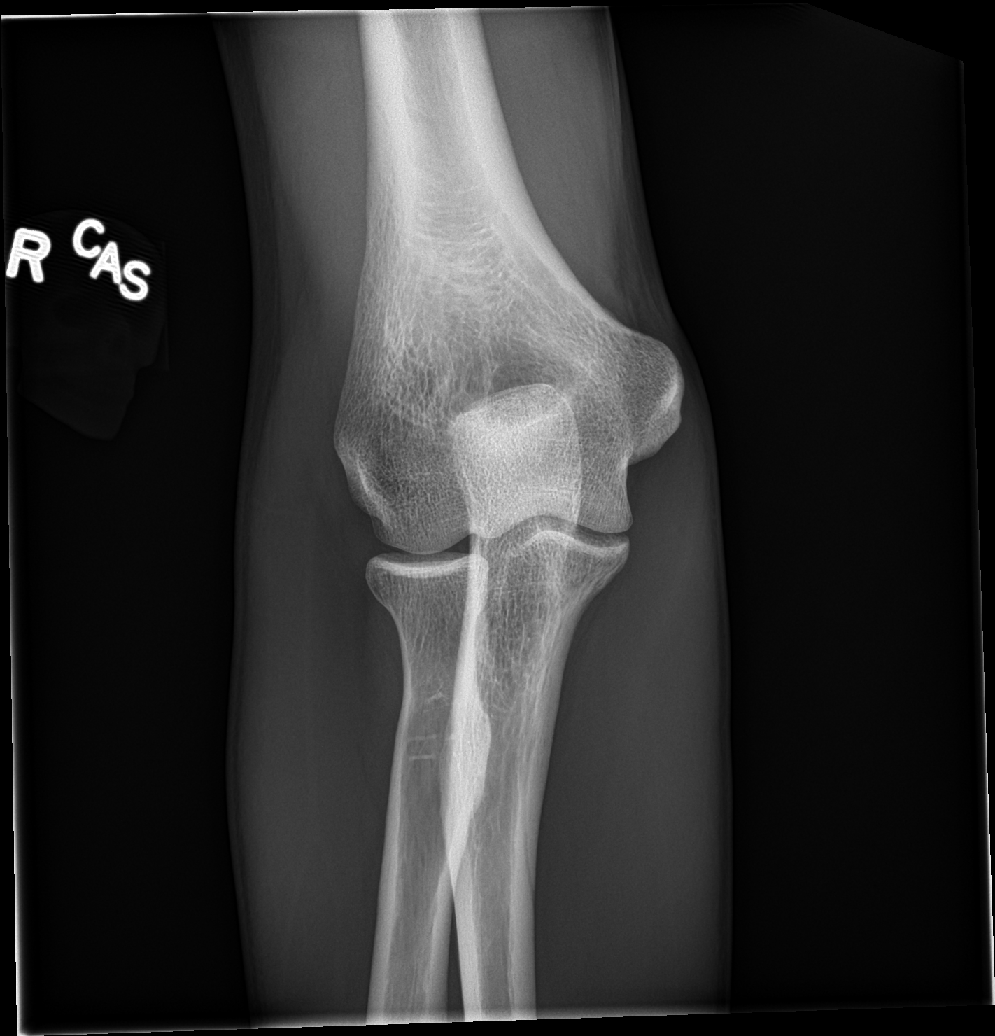

[elbow obl (1 of 2)]
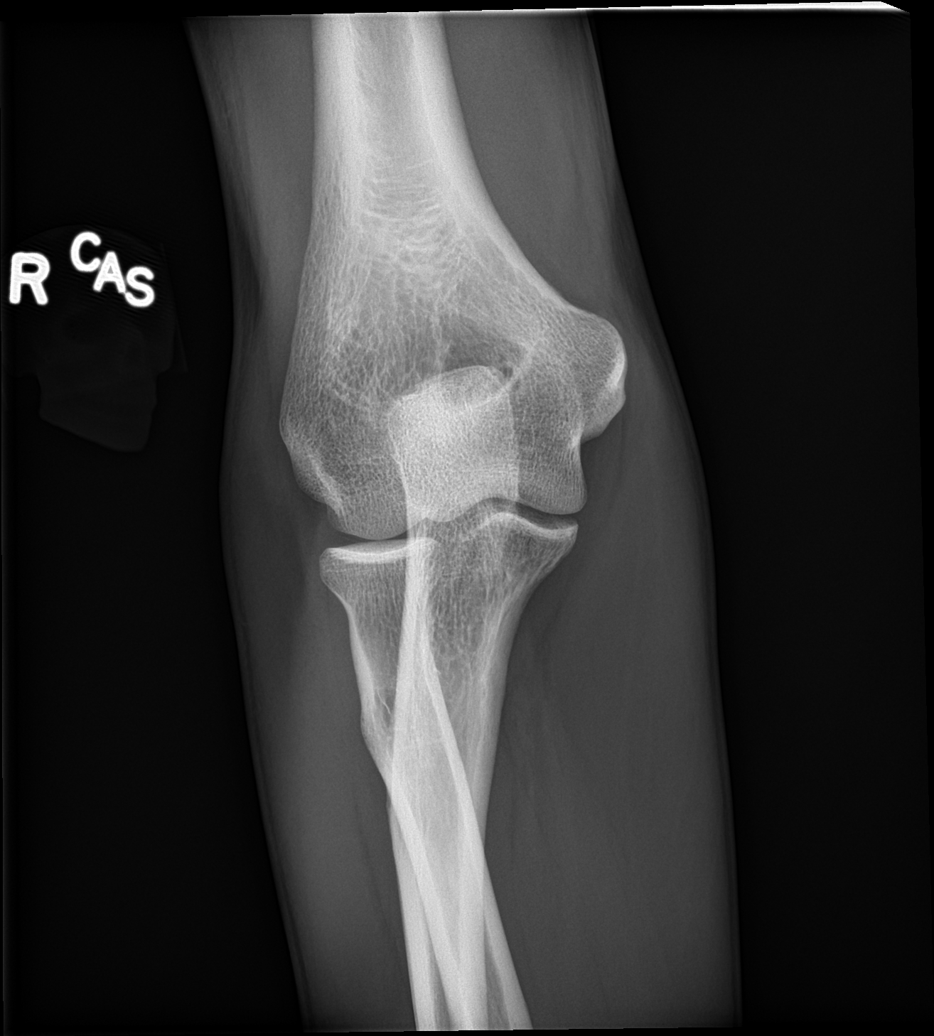

[elbow obl (2 of 2)]
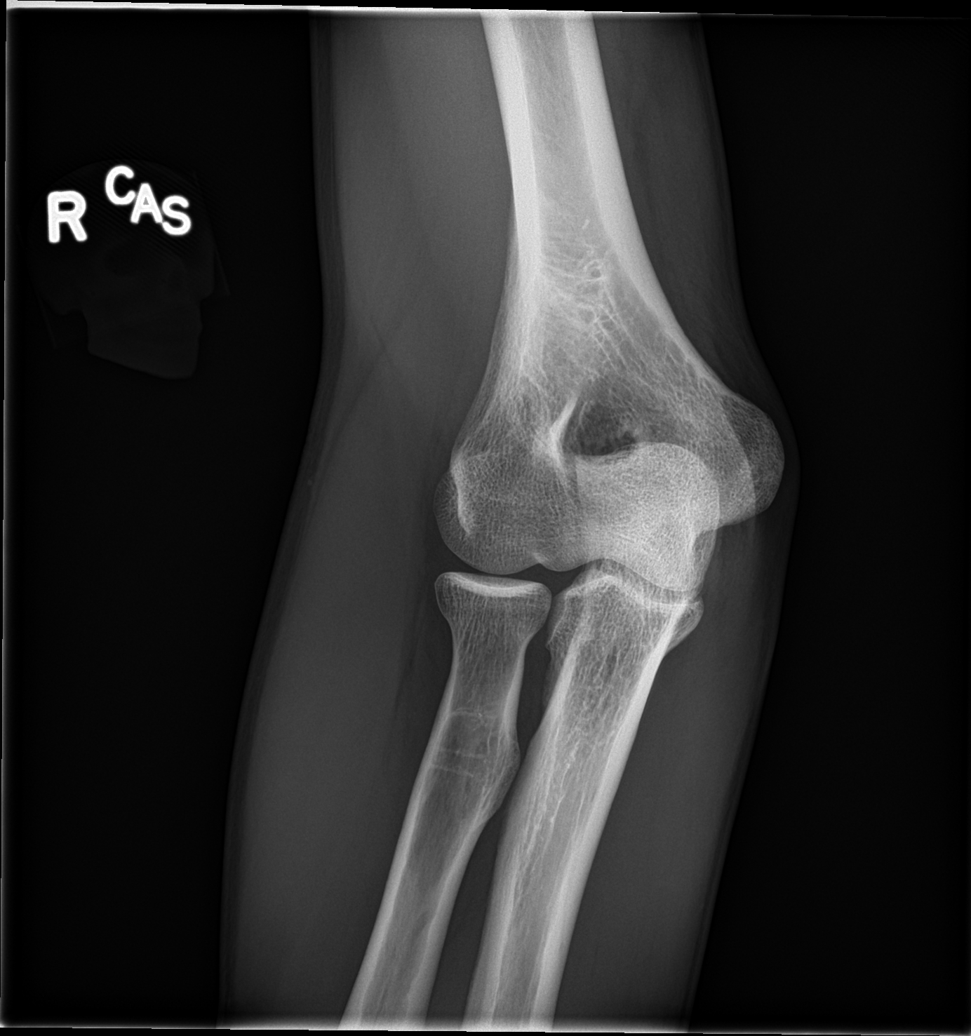

[elbow lat]
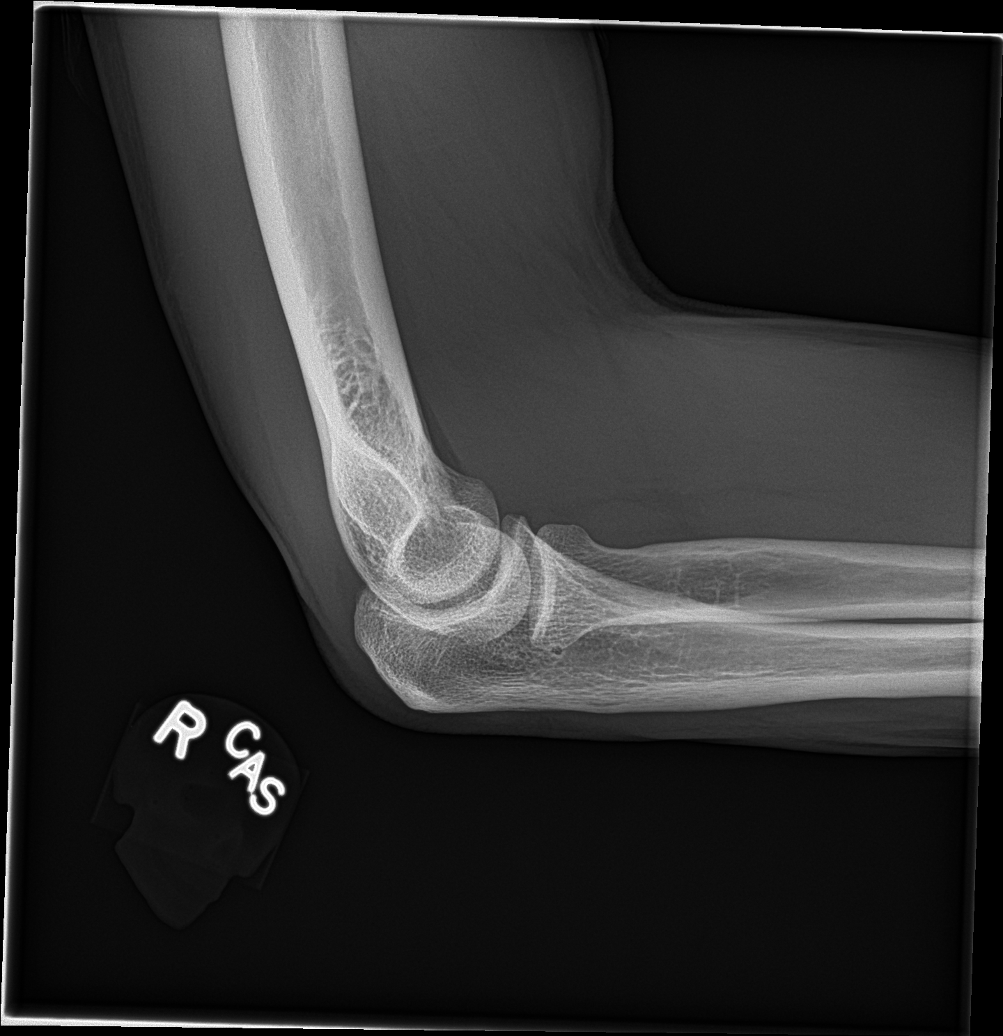

[4 of 4 positions shown; findings below may reference images not displayed]

FINDINGS: There is no evidence of fracture, dislocation, or joint effusion.
There is no evidence of arthropathy or other focal bone abnormality.
Soft tissues are unremarkable.
IMPRESSION: Negative.

## 2018-12-02 IMAGING — CT CT CERVICAL SPINE W/O CM
3 of 4 series · 11 of 33 positions shown, 13 images · non-contrast
Comparison: 06/05/2017

CLINICAL DATA: Posterior right-sided neck pain radiating down mid
to upper back after slip on floor.

EXAM:
CT CERVICAL SPINE WITHOUT CONTRAST
TECHNIQUE: Multidetector CT imaging of the cervical spine was performed without
intravenous contrast. Multiplanar CT image reconstructions were also
generated.

[Series 5: sagittal bone · sagittal · 0.19mm/px · 5 of 61 slices shown, 6 images]
[im 21/61  bone]
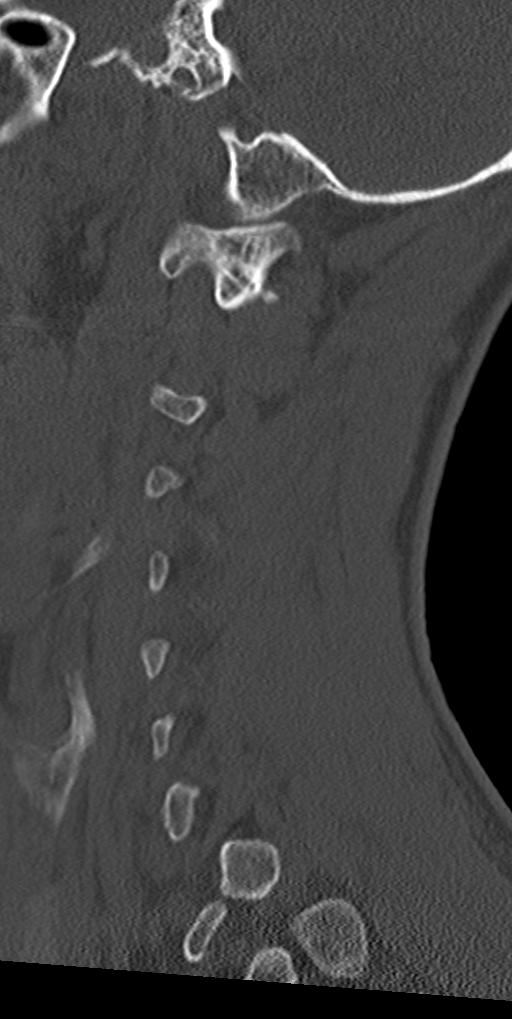
[im 26/61  bone]
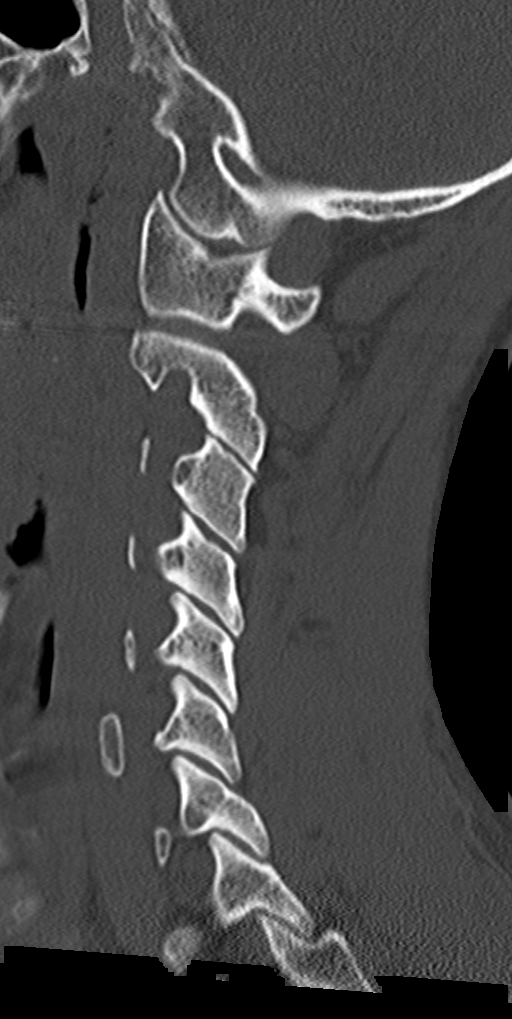
[im 31/61  soft-tissue]
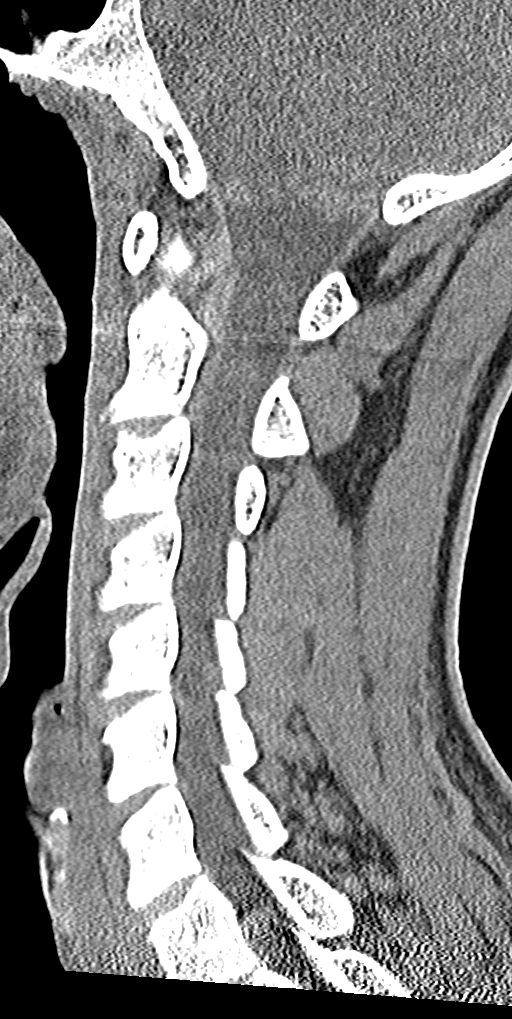
[im 31/61  bone]
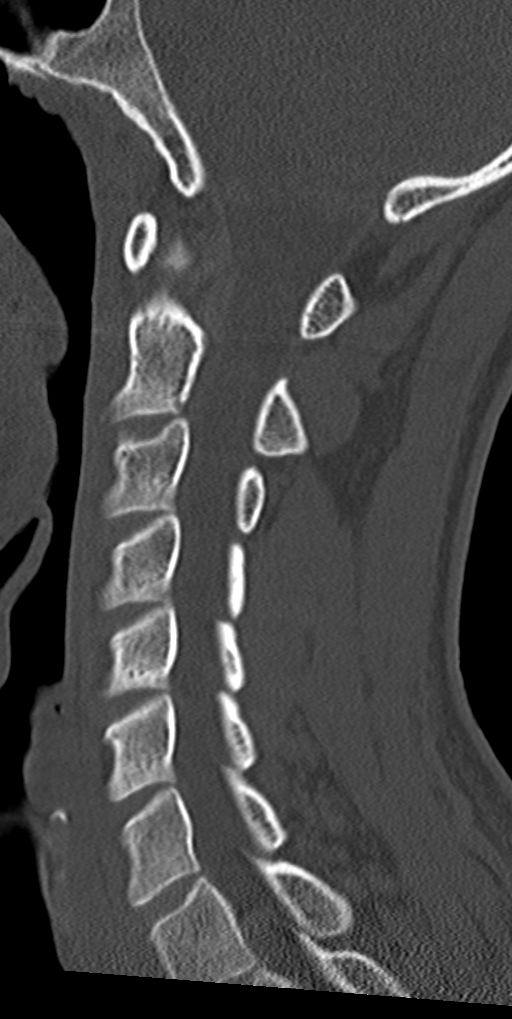
[im 36/61  bone]
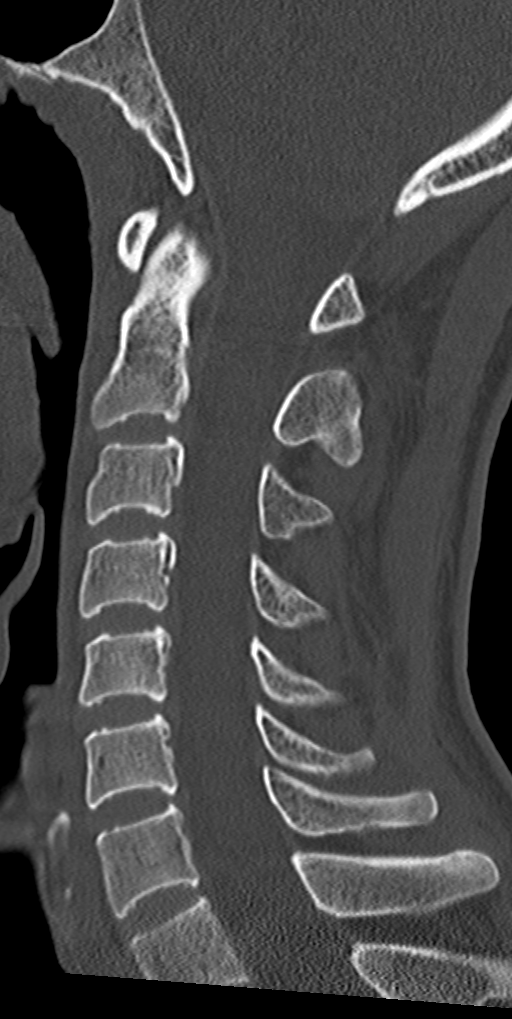
[im 41/61  bone]
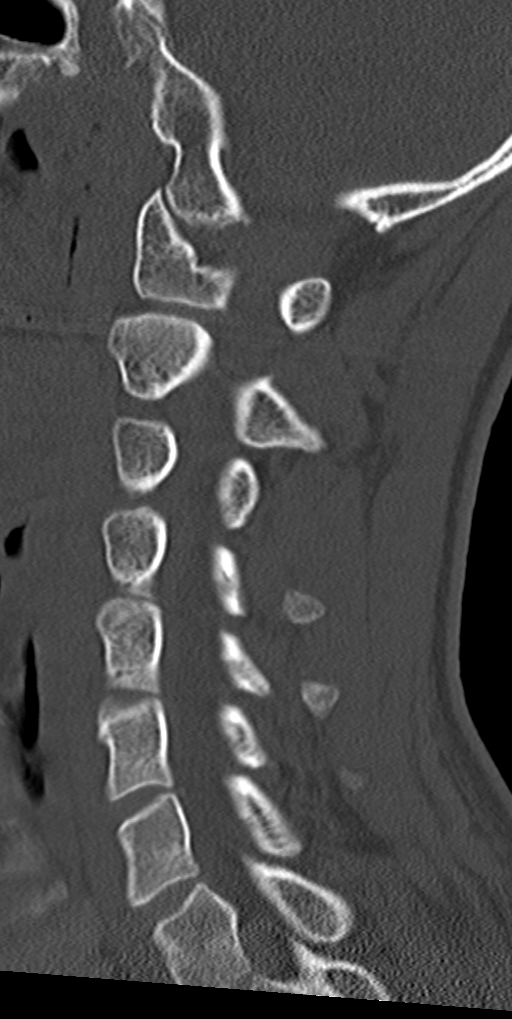

[Series 6: coronal bone · coronal · 0.29mm/px · 3 of 66 slices shown]
[im 14/66  bone]
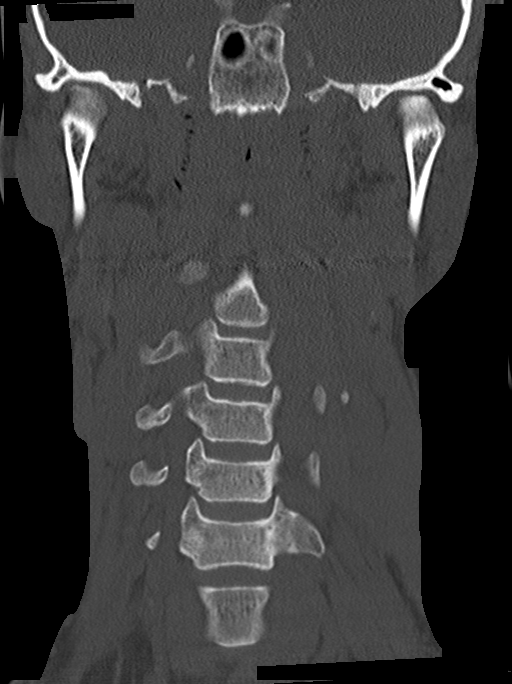
[im 27/66  bone]
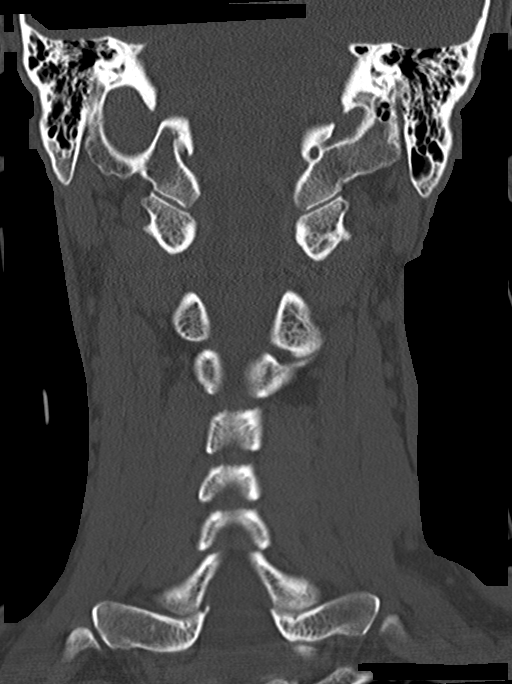
[im 40/66  bone]
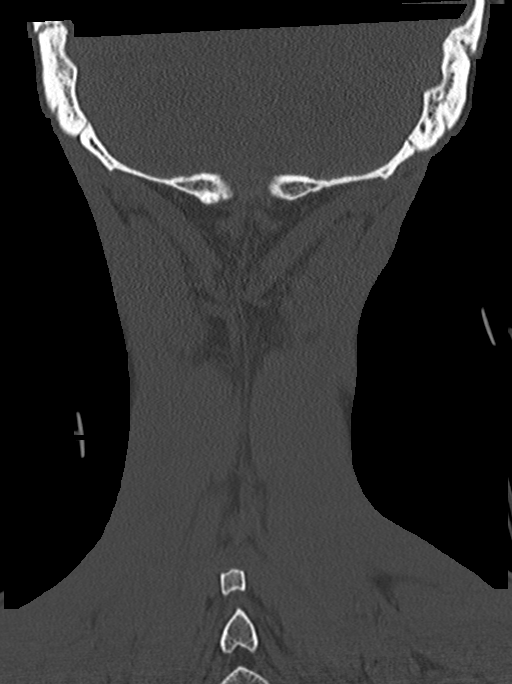

[Series 7: orthogonal bone · axial · 0.21mm/px · z∈[-34,+67]mm · 3 of 83 slices shown, 4 images]
[im 17/83  soft-tissue]
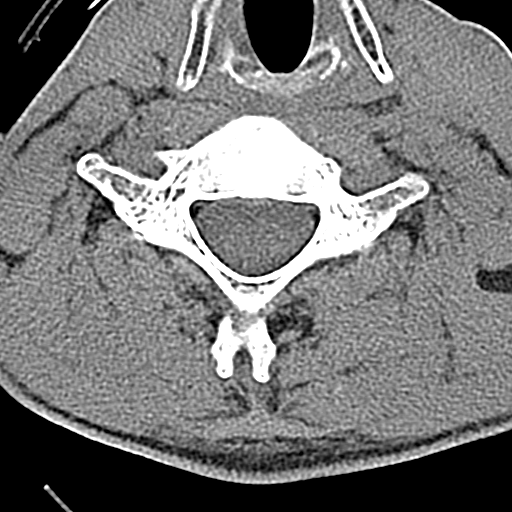
[im 17/83  bone]
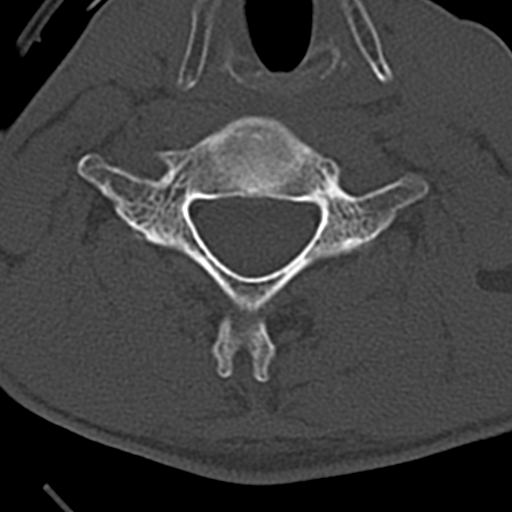
[im 50/83  bone]
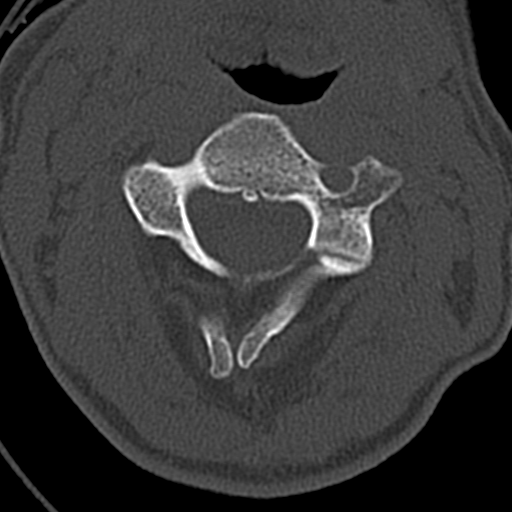
[im 66/83  bone]
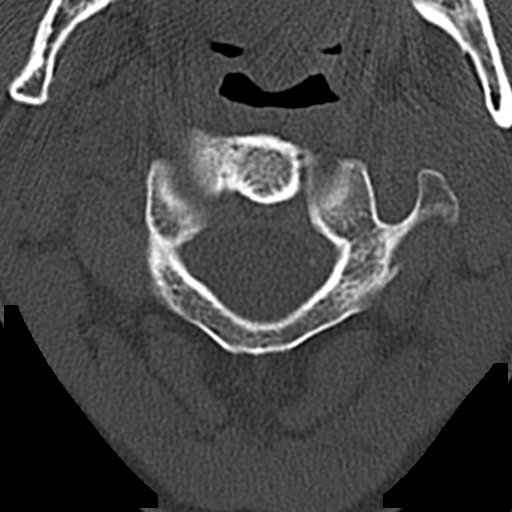

[11 of 33 positions shown; findings below may reference images not displayed]

FINDINGS: Alignment: Normal.

Skull base and vertebrae: Acute intracranial hemorrhage of the skull
base. Midline fourth ventricle without effacement. No extra-axial
fluid collections are noted. No fracture of the skull base. Intact
vertebral bodies.

Soft tissues and spinal canal: No prevertebral fluid or swelling. No
visible canal hematoma.

Disc levels: No focal disc herniation, canal or foraminal stenosis.
No jumped or perched facets.

Upper chest: Clear lung apices.

Other: None
IMPRESSION: No cervical spine fracture or static listhesis.

## 2018-12-21 ENCOUNTER — Ambulatory Visit (INDEPENDENT_AMBULATORY_CARE_PROVIDER_SITE_OTHER): Payer: Medicaid Other | Admitting: Student in an Organized Health Care Education/Training Program

## 2018-12-21 ENCOUNTER — Other Ambulatory Visit: Payer: Self-pay

## 2018-12-21 ENCOUNTER — Encounter: Payer: Self-pay | Admitting: Student in an Organized Health Care Education/Training Program

## 2018-12-21 DIAGNOSIS — F411 Generalized anxiety disorder: Secondary | ICD-10-CM | POA: Diagnosis not present

## 2018-12-21 DIAGNOSIS — F1190 Opioid use, unspecified, uncomplicated: Secondary | ICD-10-CM

## 2018-12-21 DIAGNOSIS — F1120 Opioid dependence, uncomplicated: Secondary | ICD-10-CM | POA: Diagnosis not present

## 2018-12-21 DIAGNOSIS — F1199 Opioid use, unspecified with unspecified opioid-induced disorder: Secondary | ICD-10-CM

## 2018-12-21 MED ORDER — BUPRENORPHINE HCL-NALOXONE HCL 8-2 MG SL FILM: 1.0000 | ORAL_FILM | Freq: Two times a day (BID) | SUBLINGUAL | 0 refills | Status: DC

## 2018-12-21 NOTE — Progress Notes (Signed)
  [facility] Telephone Encounter Continuity Care Appointment  HPI:   This telephone encounter was created for [name] on 12/21/2018 for the following purpose/cc opioid use disorder.  38 year old person with opioid use disorder doing a telephone follow-up visit today for medication-assisted treatment.  He is been doing very well, last seen in office in June.  Had a baby 1 month ago with his partner.  Reports doing well at home, a little increased stress, but he reports coping well.  He does have difficulty with anxiety and and anger disorder.  He tried diazepam about 2 months ago, he reports not feeling much benefit.  Says that his mood is stable right now.  Reports feeling safe at home.  He reports good compliance with Suboxone.  He transition to the film when he got Medicaid insurance coverage.  Reports that the film works better for him than the tablet.  Says his cravings are well controlled.  Has not used injection drugs or relapsed in many months.  Reports staying physically distant and wears a mask to avoid COVID-19.  Currently not working but he would like to return to work as soon as able.  He wants to continue treatment with Suboxone.  Denies any adverse side effects.   Past Medical History:  Past Medical History:  Diagnosis Date  . Bacteremia due to methicillin susceptible Staphylococcus aureus (MSSA) 06/06/2017  . Chest wall abscess   . Hx of septic arthritis    Left hip  . 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 Left hip postoperative wound infection   . MSSA (methicillin susceptible Staphylococcus aureus) 05/2017  . MSSA bacteremia   . Neck abscess 06/05/2017  . Normocytic anemia   . Opioid use disorder ([place]) 08/08/2017  . Opioid use disorder ([place])   . Pneumonia   . Septic arthritis of hip ([place]) 06/06/2017  . Septic arthritis of sternoclavicular joint, right ([place]) 06/06/2017      ROS:      Assessment / Plan / Recommendations:    Please see A&P under problem oriented charting for assessment of the patient's acute and chronic medical conditions.   As always, pt is advised that if symptoms worsen or new symptoms arise, they should go to an urgent care facility or to to ER for further evaluation.   Consent and Medical Decision Making:   This is a telephone encounter between Clifford Tucker and Clifford Tucker on 12/21/2018 for management of opioid use disorder. The visit was conducted with the patient located at home and Clifford Tucker at [facility]. The patient's identity was confirmed using their DOB and current address. The patient has consented to being evaluated through a telephone encounter and understands the associated risks (an examination cannot be done and the patient may need to come in for an appointment) / benefits (allows the patient to remain at home, decreasing exposure to coronavirus). I personally spent 7 minutes on medical discussion.

## 2018-12-21 NOTE — Assessment & Plan Note (Signed)
Seems to have stabilized.  Patient did not find much benefit from diazepam.  He is not interested in an SSRI or other long-term medication.  We will continue to manage his OUD as above, will have to monitor his intermittent anxiety as well.

## 2018-12-21 NOTE — Assessment & Plan Note (Signed)
Doing well, stable.  Seems to be at a good dose of buprenorphine which is controlling his cravings.  Been a long time since his last injection drug use.  We need to bring him in for a urine tox screen at some point, last one was in March.  I think it is best to keep him and his partner together, she recently had a baby.  Plan schedule a follow-up by telephone in 4 weeks, after that next visit will be in person when we can collect a tox screen.  For now we will continue with Suboxone film 8 mg 2 daily.  I teviewed the database which was appropriate.

## 2019-01-02 IMAGING — DX DG FOREARM 2V*L*
2 series · 2 of 2 positions shown · non-contrast
Comparison: None.

CLINICAL DATA: Drill bit injury causing laceration to the left
forearm.

EXAM:
LEFT FOREARM - 2 VIEW

[forearm ap]
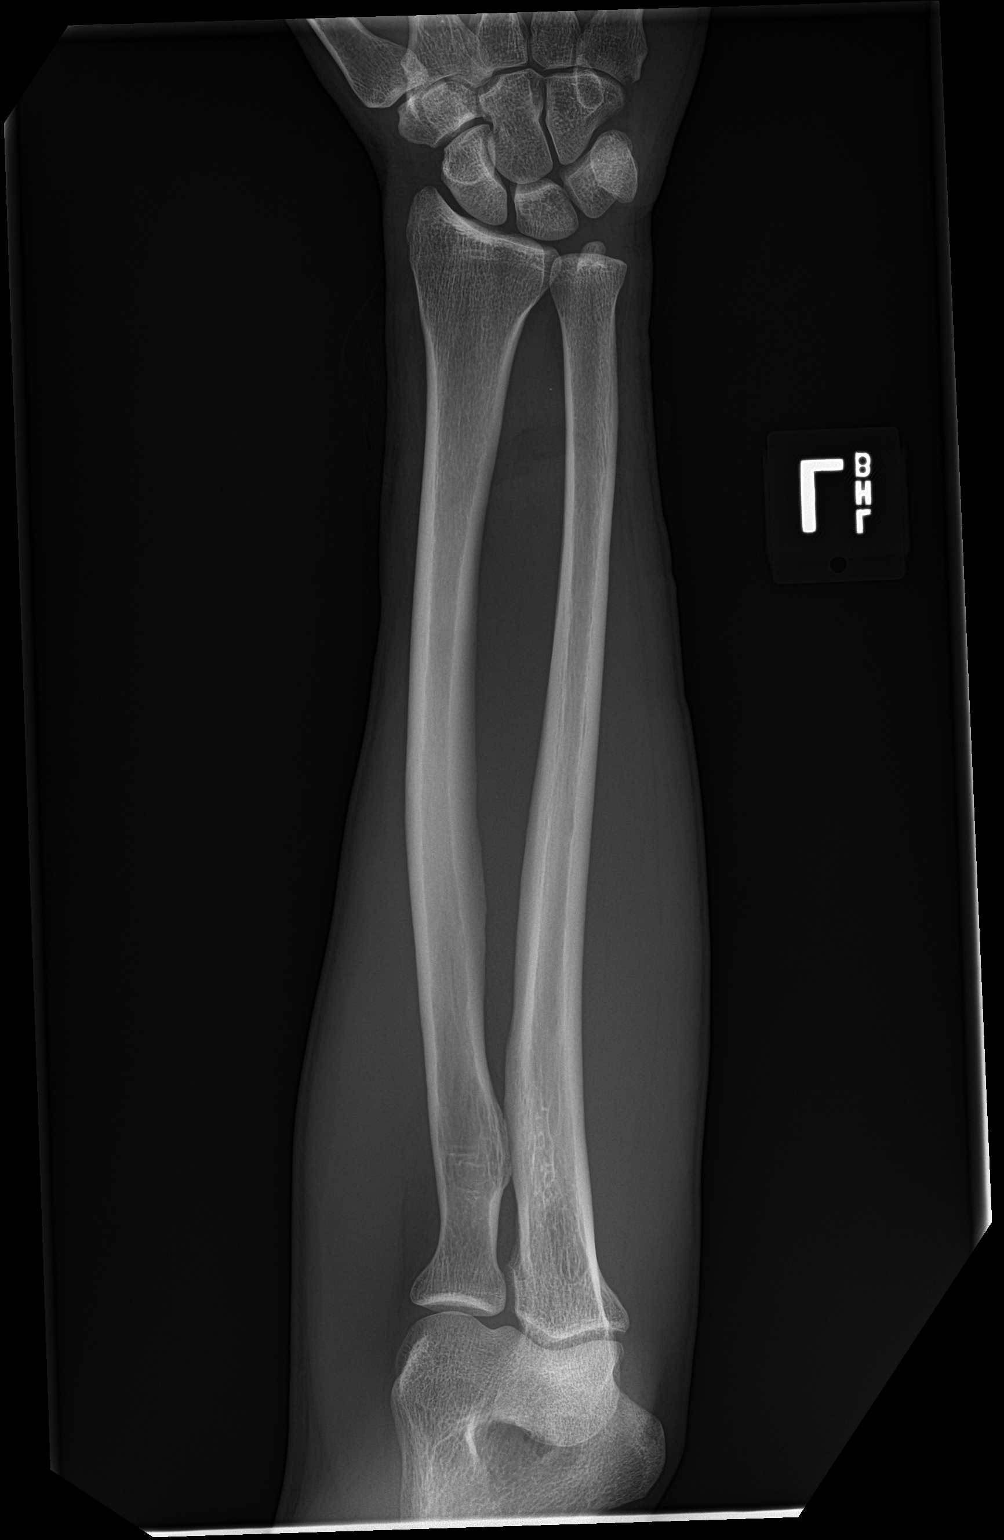

[forearm lat]
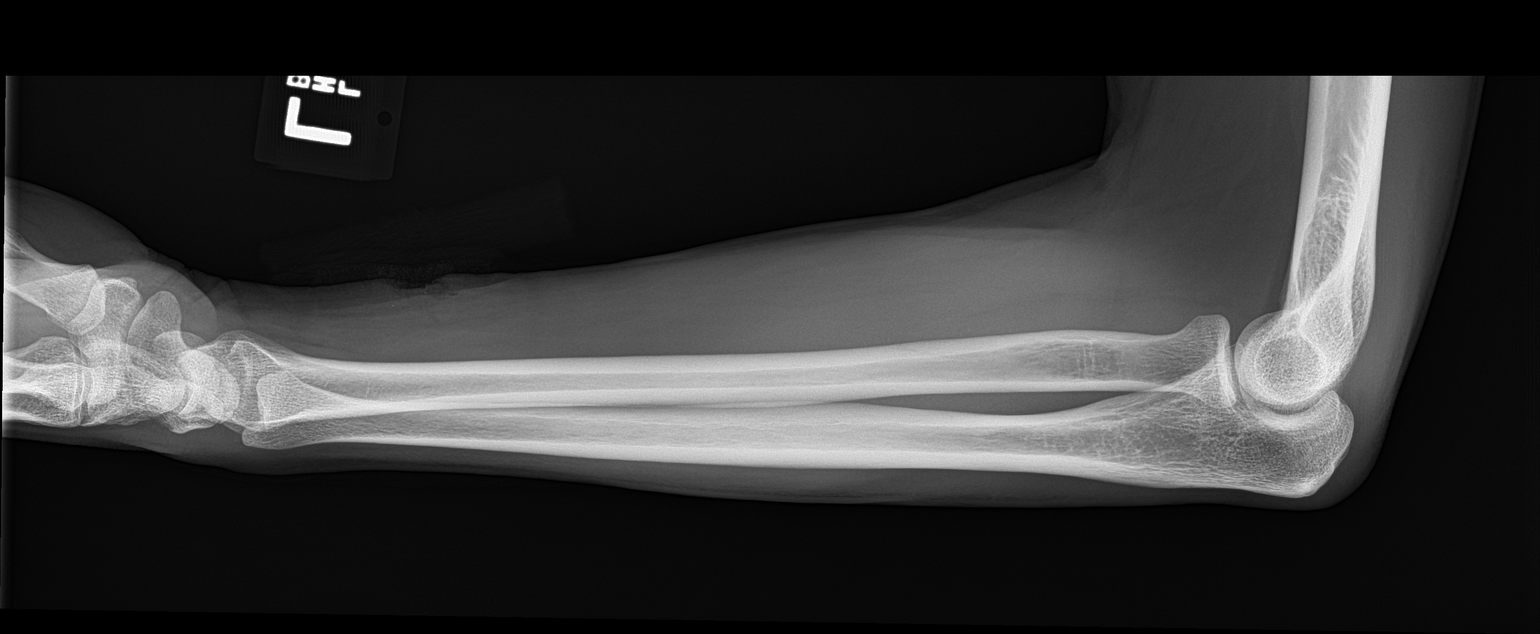

[2 of 2 positions shown; findings below may reference images not displayed]

FINDINGS: There is a soft tissue injury over the anterior distal forearm near
the wrist. 4 tiny foci of increased density along the laceration
consistent metal fragments. These may reside on the skin surface.

No fracture.  No bone lesion.

Wrist and elbow joints are normally spaced and aligned.
IMPRESSION: 1. No fracture, bone lesion or joint abnormality.
2. Soft tissue laceration to the distal anterior forearm. Four tiny,
superficial, radiopaque foreign bodies project along the deep margin
of the laceration and adjacent to the laceration.

## 2019-01-22 ENCOUNTER — Other Ambulatory Visit: Payer: Self-pay

## 2019-01-22 ENCOUNTER — Ambulatory Visit (INDEPENDENT_AMBULATORY_CARE_PROVIDER_SITE_OTHER): Payer: Medicaid Other | Admitting: Internal Medicine

## 2019-01-22 DIAGNOSIS — F1199 Opioid use, unspecified with unspecified opioid-induced disorder: Secondary | ICD-10-CM

## 2019-01-22 DIAGNOSIS — B182 Chronic viral hepatitis C: Secondary | ICD-10-CM

## 2019-01-22 DIAGNOSIS — F1120 Opioid dependence, uncomplicated: Secondary | ICD-10-CM

## 2019-01-22 DIAGNOSIS — F1190 Opioid use, unspecified, uncomplicated: Secondary | ICD-10-CM

## 2019-01-22 MED ORDER — BUPRENORPHINE HCL-NALOXONE HCL 8-2 MG SL FILM: 1.0000 | ORAL_FILM | Freq: Two times a day (BID) | SUBLINGUAL | 0 refills | Status: DC

## 2019-01-22 NOTE — Assessment & Plan Note (Signed)
I discussed with him again today that he has chronic hepatitis C.  We were in the process of starting treatment back in March when the coronavirus pandemic started.  He reports that he never picked up [place] and never started the medication.  He has questions today about the diagnosis he reports that he has no symptoms and he wonders if this could be an error.  I discussed with him the nature of hepatitis C and that it can be largely asymptomatic for years before ultimately causing cirrhosis and potentially even hepatocellular carcinoma.  I discussed with him that the treatment is fairly benign and is usually well-tolerated with an excellent cure rate.  He was appreciative of this information.  He wants to establish with a primary care physician in our office.  I discussed the when he does this I would recommend that we repeat testing with a repeat viral load CBC and CMP and pursue treatment at that time.  Her previous labs did show genotype 2 with lab testing suggesting F1 F2 fibrosis.

## 2019-01-22 NOTE — Progress Notes (Signed)
  [facility] Telephone Encounter Continuity Care Appointment  HPI:   This telephone encounter was created for [name] on 01/22/2019 for the following purpose/cc opioid use disorder.  38 year old person with opioid use disorder doing a telephone follow-up visit today for medication-assisted treatment.  He reports to me that he continues to take Suboxone twice daily he is doing very well with this and cravings are well controlled.  He also feels like his anxiety is doing much better.  He has had no relapses.  He still has newborn baby at home but he reports wife baby and him are all doing well.  No issues.   Past Medical History:  Past Medical History:  Diagnosis Date  . Bacteremia due to methicillin susceptible Staphylococcus aureus (MSSA) 06/06/2017  . Chest wall abscess   . Hx of septic arthritis    Left hip  . 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 Left hip postoperative wound infection   . MSSA (methicillin susceptible Staphylococcus aureus) 05/2017  . MSSA bacteremia   . Neck abscess 06/05/2017  . Normocytic anemia   . Opioid use disorder ([place]) 08/08/2017  . Opioid use disorder ([place])   . Pneumonia   . Septic arthritis of hip ([place]) 06/06/2017  . Septic arthritis of sternoclavicular joint, right ([place]) 06/06/2017     ROS:   No Fever, chills  No diarrhea or constipation   Assessment / Plan / Recommendations:   Please see A&P under problem oriented charting for assessment of the patient's acute and chronic medical conditions.   As always, pt is advised that if symptoms worsen or new symptoms arise, they should go to an urgent care facility or to to ER for further evaluation.   Consent and Medical Decision Making:   This is a telephone encounter between Clifford Tucker and Clifford Tucker on 01/22/2019 for management of opioid use disorder. The visit was conducted with the patient located at home and Clifford Tucker at [facility]. The  patient's identity was confirmed using their DOB and current address. The patient has consented to being evaluated through a telephone encounter and understands the associated risks (an examination cannot be done and the patient may need to come in for an appointment) / benefits (allows the patient to remain at home, decreasing exposure to coronavirus). I personally spent 10 minutes on medical discussion.

## 2019-01-22 NOTE — Assessment & Plan Note (Signed)
Overall appears to be doing well we will continue Suboxone 8-2 mg twice daily.  We will plan on a follow-up visit by telephone in 1 month.

## 2019-02-18 ENCOUNTER — Other Ambulatory Visit: Payer: Self-pay | Admitting: *Deleted

## 2019-02-19 MED ORDER — BUPRENORPHINE HCL-NALOXONE HCL 8-2 MG SL FILM: 1.0000 | ORAL_FILM | Freq: Two times a day (BID) | SUBLINGUAL | 0 refills | Status: DC

## 2019-02-19 MED FILL — SUBOXONE 8 MG-2 MG SL FILM: 8-2 | 30 days supply | Qty: 60 | Fill #0

## 2019-02-26 ENCOUNTER — Other Ambulatory Visit: Payer: Self-pay

## 2019-02-26 ENCOUNTER — Ambulatory Visit (INDEPENDENT_AMBULATORY_CARE_PROVIDER_SITE_OTHER): Payer: Medicaid Other | Admitting: Internal Medicine

## 2019-02-26 DIAGNOSIS — F1120 Opioid dependence, uncomplicated: Secondary | ICD-10-CM

## 2019-02-26 DIAGNOSIS — F1199 Opioid use, unspecified with unspecified opioid-induced disorder: Secondary | ICD-10-CM

## 2019-02-26 DIAGNOSIS — F1190 Opioid use, unspecified, uncomplicated: Secondary | ICD-10-CM

## 2019-02-26 DIAGNOSIS — B182 Chronic viral hepatitis C: Secondary | ICD-10-CM | POA: Diagnosis not present

## 2019-02-26 MED ORDER — BUPRENORPHINE HCL-NALOXONE HCL 8-2 MG SL FILM: 1.0000 | ORAL_FILM | Freq: Two times a day (BID) | SUBLINGUAL | 0 refills | Status: DC

## 2019-02-26 NOTE — Patient Instructions (Signed)
Instructions given over the phone.  

## 2019-02-26 NOTE — Progress Notes (Signed)
  [facility] Telephone Encounter Continuity Care Appointment  HPI:  This telephone encounter was created for [name] on 02/26/2019 for the following purpose:  Tykel Badie presents for follow up of opioid use disorder I have reviewed the prior induction visit, follow up visits, and telephone encounters relevant to opiate use disorder (OUD) treatment.   Current daily dose: 16mg  of suboxone daily  Date of Induction: 08/02/17  Current follow up interval, in weeks: 4  The patient has been adherent with the buprenorphine for OUD contract.   Last UDS Result: Appropriate for suboxone, metabolite of cocaine also found, low titer.  Gerald Stabs reports that he is doing well.  He has had no relapse and no issues with withdrawal on current dose of suboxone.  He and his wife have a 3 month baby at home and that has been going well, except for the normal issues with having a young child.  He would like to establish with a PCP in our clinic and I will assist him in doing that today.  His last UDS was in March of this year.  He will likely need a UDS at next in person visit for follow up.    Past Medical History:  Past Medical History:  Diagnosis Date  . Bacteremia due to methicillin susceptible Staphylococcus aureus (MSSA) 06/06/2017  . Chest wall abscess   . Hx of septic arthritis    Left hip  . 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 Left hip postoperative wound infection   . MSSA (methicillin susceptible Staphylococcus aureus) 05/2017  . MSSA bacteremia   . Neck abscess 06/05/2017  . Normocytic anemia   . Opioid use disorder ([place]) 08/08/2017  . Opioid use disorder ([place])   . Pneumonia   . Septic arthritis of hip ([place]) 06/06/2017  . Septic arthritis of sternoclavicular joint, right ([place]) 06/06/2017      ROS:   Reports no symptoms, no withdrawal, no concerning issues today.    Assessment / Plan / Recommendations:   Please see A&P under problem  oriented charting for assessment of the patient's acute and chronic medical conditions.   As always, pt is advised that if symptoms worsen or new symptoms arise, they should go to an urgent care facility or to to ER for further evaluation.   Consent and Medical Decision Making:   This is a telephone encounter between [employer] and Gilles Chiquito on 02/26/2019 for OUD follow up. The visit was conducted with the patient located at home and Gilles Chiquito at [facility]. The patient's identity was confirmed using their DOB and full name. The patient has consented to being evaluated through a telephone encounter and understands the associated risks (an examination cannot be done and the patient may need to come in for an appointment) / benefits (allows the patient to remain at home, decreasing exposure to coronavirus). I personally spent 10 minutes on medical discussion.

## 2019-02-26 NOTE — Assessment & Plan Note (Addendum)
Clifford Tucker is doing very well.  Reports no relapse of opiate use.  Last UDS with cocaine metabolite.  Will need UDS at next in person visit.  He has never had opiates in his UDS beyond buprenorphine and has had appropriate metabolites of buprenorphine.  He is doing well under this treatment.   Plan Refill current dose of buprenorphone-naloxone - 8-2mg  BID In person visit in 1-2 months for PCP establish and UDS Telephone visit in 1-2 months for OUD follow up.

## 2019-02-26 NOTE — Assessment & Plan Note (Signed)
Will recheck labs at next visit at patient request to confirm chronic infection.

## 2019-02-26 NOTE — Addendum Note (Signed)
Addended by: Gilles Chiquito B on: 02/26/2019 08:56 AM   Modules accepted: Orders

## 2019-04-01 ENCOUNTER — Encounter: Payer: Self-pay | Admitting: Internal Medicine

## 2019-04-01 ENCOUNTER — Other Ambulatory Visit: Payer: Self-pay | Admitting: *Deleted

## 2019-04-01 MED ORDER — BUPRENORPHINE HCL-NALOXONE HCL 8-2 MG SL FILM: 1.0000 | ORAL_FILM | Freq: Two times a day (BID) | SUBLINGUAL | 0 refills | Status: DC

## 2019-04-01 MED FILL — SUBOXONE 8 MG-2 MG SL FILM: 8-2 | 30 days supply | Qty: 60 | Fill #0

## 2019-04-02 ENCOUNTER — Encounter: Payer: Self-pay | Admitting: Student in an Organized Health Care Education/Training Program

## 2019-04-02 ENCOUNTER — Other Ambulatory Visit: Payer: Self-pay | Admitting: Internal Medicine

## 2019-04-09 ENCOUNTER — Encounter: Payer: Self-pay | Admitting: General Practice

## 2019-04-09 ENCOUNTER — Ambulatory Visit: Payer: Medicaid Other

## 2019-04-16 NOTE — Progress Notes (Deleted)
   04/16/2019  Dorann Lodge presents for follow up of opioid use disorder I have reviewed the prior induction visit, follow up visits, and telephone encounters relevant to opiate use disorder (OUD) treatment.   Current daily dose: ***  Date of Induction: ***  Current follow up interval, in weeks: ***  The patient {Has/has not:18111} been adherent with the buprenorphine for OUD contract.   Last UDS Result: 07/31/2018 :Positive for Buprenorphine+ nor burenorphine+ Benzoylecgonine  HPI: ***  Exam:   There were no vitals filed for this visit.  ***  Assessment/Plan:  See Problem Based Charting in the Encounters Tab     Dewayne Hatch, MD  04/16/2019  8:59 AM

## 2019-04-30 ENCOUNTER — Ambulatory Visit (INDEPENDENT_AMBULATORY_CARE_PROVIDER_SITE_OTHER): Payer: Medicaid Other | Admitting: Internal Medicine

## 2019-04-30 ENCOUNTER — Other Ambulatory Visit: Payer: Self-pay | Admitting: Internal Medicine

## 2019-04-30 ENCOUNTER — Other Ambulatory Visit: Payer: Self-pay

## 2019-04-30 ENCOUNTER — Encounter: Payer: Self-pay | Admitting: Student in an Organized Health Care Education/Training Program

## 2019-04-30 VITALS — BP 109/58 | HR 81 | Temp 99.3°F | Ht 71.0 in | Wt 137.2 lb

## 2019-04-30 DIAGNOSIS — M545 Low back pain, unspecified: Secondary | ICD-10-CM | POA: Insufficient documentation

## 2019-04-30 DIAGNOSIS — G8929 Other chronic pain: Secondary | ICD-10-CM

## 2019-04-30 DIAGNOSIS — M546 Pain in thoracic spine: Secondary | ICD-10-CM

## 2019-04-30 DIAGNOSIS — F1190 Opioid use, unspecified, uncomplicated: Secondary | ICD-10-CM

## 2019-04-30 DIAGNOSIS — F1199 Opioid use, unspecified with unspecified opioid-induced disorder: Secondary | ICD-10-CM

## 2019-04-30 DIAGNOSIS — F1120 Opioid dependence, uncomplicated: Secondary | ICD-10-CM

## 2019-04-30 DIAGNOSIS — B182 Chronic viral hepatitis C: Secondary | ICD-10-CM | POA: Diagnosis not present

## 2019-04-30 MED ORDER — BUPRENORPHINE HCL-NALOXONE HCL 8-2 MG SL FILM: 1.0000 | ORAL_FILM | Freq: Two times a day (BID) | SUBLINGUAL | 0 refills | Status: DC

## 2019-04-30 NOTE — Assessment & Plan Note (Addendum)
HPI: He has a history of hepatitis C genotype 2B was not able to obtain [place] before needs new labs.  He is willing to undergo treatment at this time.  His OUD is well controlled with Suboxone. Previous Fibrosure F1-F2  Assessment, chronic hepatitis C genotype 2B  Plan Has hep B immunity from previous labs do not need to recheck hep B Recheck HIV, CBC, CMP, HCV viral load Pending these results we will likely be able to obtain [place] prior authorization instructed he will need to take 3 pills daily for 8 weeks.  ADDENDUM: Labs show HCV infection, HIV negative, proceede with Hep C treatment with Mavyret

## 2019-04-30 NOTE — Assessment & Plan Note (Signed)
Assessment chronic low back pain without sciatica  Plan Pain is been present for over 6 weeks and failed conservative therapy.  He is not an active IV drug user and has no red flag symptoms but would be beneficial to evaluate with a noncontrasted MRI of his lumbar spine.

## 2019-04-30 NOTE — Assessment & Plan Note (Signed)
Assessment opioid use disorder, severe  Plan Continue Suboxone 8-2 twice daily -Check U tox

## 2019-04-30 NOTE — Progress Notes (Signed)
   04/30/2019  Clifford Tucker presents for follow up of opioid use disorder I have reviewed the prior induction visit, follow up visits, and telephone encounters relevant to opiate use disorder (OUD) treatment.   Current daily dose: 16mg  daily  Date of Induction: 08/02/17  Current follow up interval, in weeks: 4 weeks (has mostly been telehealth with occasional in person visits  The patient has been adherent with the buprenorphine for OUD contract.   Last UDS Result: 07/2018+ for buprenorphine and metab of cocaine (previous use)  HPI: Clifford Tucker is a 38 year old man who presents for office based treatment of OUD with buprenorphine.  He reports to me that Suboxone has controlled his cravings he has not had any relapses and feels that he has been doing well from that standpoint.  However he is agitated and filling out the PHQ-9 and GAD questionnaires.  He reports to me that his main issue is chronic lumbar back pain with occasional thoracic area back pain.  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  He feels that no one listens to him and that he should not have back pain like this at his age.  He denies any loss of bowel or bladder function pain does not wake him at night, pain has been chronic over at least a year usually gets worse than then better after a week of rest and over-the-counter medications.  However for the past few months he has not had relief.    Exam:   Vitals:   04/30/19 0845  BP: (!) 109/58  Pulse: 81  Temp: 99.3 F (37.4 C)  TempSrc: Oral  SpO2: 99%  Weight: 137 lb 3.2 oz (62.2 kg)  Height: 5\' 11"  (1.803 m)    General: anxious, frustrated HENT: Wearing a mask MSK: Thorcacic and lumbar spine without significant scoliosis, no significant pain on spinous processes, mild lumbar paraspinal tenderness, mild  tenderness over rhomboids bilaterally, able to flex and rotate back fully (touch toes, rotate 90 degrees to both sides), no LE weakness.   Assessment/Plan:  See Problem Based Charting in the Encounters Tab     Lucious Groves, DO  04/30/2019  9:27 AM

## 2019-04-30 NOTE — Patient Instructions (Signed)
I am ordering an MRI of your lumbar spine to assess your chronic low back pain.  We will recheck your hepatitis C labs and see about getting you treatment.

## 2019-05-01 LAB — CMP14 + ANION GAP
ALT: 67 IU/L — ABNORMAL HIGH (ref 0–44)
AST: 38 IU/L (ref 0–40)
Albumin/Globulin Ratio: 2.1 (ref 1.2–2.2)
Albumin: 4.9 g/dL (ref 4.0–5.0)
Alkaline Phosphatase: 95 IU/L (ref 39–117)
Anion Gap: 17 mmol/L (ref 10.0–18.0)
BUN/Creatinine Ratio: 12 (ref 9–20)
BUN: 10 mg/dL (ref 6–20)
Bilirubin Total: 0.5 mg/dL (ref 0.0–1.2)
CO2: 22 mmol/L (ref 20–29)
Calcium: 9.3 mg/dL (ref 8.7–10.2)
Chloride: 100 mmol/L (ref 96–106)
Creatinine, Ser: 0.86 mg/dL (ref 0.76–1.27)
GFR calc Af Amer: 128 mL/min/{1.73_m2} (ref >59)
GFR calc non Af Amer: 111 mL/min/{1.73_m2} (ref >59)
Globulin, Total: 2.3 g/dL (ref 1.5–4.5)
Glucose: 82 mg/dL (ref 65–99)
Potassium: 5.4 mmol/L — ABNORMAL HIGH (ref 3.5–5.2)
Sodium: 139 mmol/L (ref 134–144)
Total Protein: 7.2 g/dL (ref 6.0–8.5)

## 2019-05-01 LAB — CBC WITH DIFFERENTIAL/PLATELET
Basophils Absolute: 0 10*3/uL (ref 0.0–0.2)
Basos: 0 %
EOS (ABSOLUTE): 0.2 10*3/uL (ref 0.0–0.4)
Eos: 3 %
Hematocrit: 40.9 % (ref 37.5–51.0)
Hemoglobin: 14.6 g/dL (ref 13.0–17.7)
Immature Grans (Abs): 0 10*3/uL (ref 0.0–0.1)
Immature Granulocytes: 0 %
Lymphocytes Absolute: 2.8 10*3/uL (ref 0.7–3.1)
Lymphs: 34 %
MCH: 31.9 pg (ref 26.6–33.0)
MCHC: 35.7 g/dL (ref 31.5–35.7)
MCV: 90 fL (ref 79–97)
Monocytes Absolute: 0.9 10*3/uL (ref 0.1–0.9)
Monocytes: 10 %
Neutrophils Absolute: 4.4 10*3/uL (ref 1.4–7.0)
Neutrophils: 53 %
Platelets: 208 10*3/uL (ref 150–450)
RBC: 4.57 x10E6/uL (ref 4.14–5.80)
RDW: 12.5 % (ref 11.6–15.4)
WBC: 8.4 10*3/uL (ref 3.4–10.8)

## 2019-05-01 LAB — HCV RNA QUANT
HCV log10: 6.121 log10 IU/mL
Hepatitis C Quantitation: 1320000 IU/mL

## 2019-05-01 LAB — HIV ANTIBODY (ROUTINE TESTING W REFLEX): HIV Screen 4th Generation wRfx: NONREACTIVE

## 2019-05-01 MED ORDER — MAVYRET 100-40 MG PO TABS: 3.0000 | ORAL_TABLET | Freq: Every day | ORAL | 1 refills | Status: DC

## 2019-05-01 NOTE — Addendum Note (Signed)
Addended by: Joni Reining C on: 05/01/2019 10:48 AM   Modules accepted: Orders

## 2019-05-02 ENCOUNTER — Telehealth: Payer: Self-pay | Admitting: *Deleted

## 2019-05-02 NOTE — Telephone Encounter (Signed)
Received fax from [place] requesting ICD-10 and clinical data for Mavyret 100/40 mg. Form placed in Dr. Jodene Nam box for completion. Hubbard Hartshorn, BSN, RN-BC

## 2019-05-03 LAB — TOXASSURE SELECT,+ANTIDEPR,UR

## 2019-05-06 NOTE — Telephone Encounter (Signed)
completed

## 2019-05-14 ENCOUNTER — Telehealth: Payer: Self-pay | Admitting: *Deleted

## 2019-05-14 NOTE — Telephone Encounter (Addendum)
PA request information was refaxed to [employer] on 05/14/2019.  Awaiting determination.  Sander Nephew, RN 05/14/2019 9:10 AM. Call to [place] was approved on 05/14/2019 thru 07/13/2019.  [place] [id number].  I [id number].  Sander Nephew, RN 05/20/2019 11:13 AM

## 2019-05-20 ENCOUNTER — Other Ambulatory Visit: Payer: Medicaid Other

## 2019-05-20 NOTE — Telephone Encounter (Signed)
Call from [place] - f/u on PA for Clifford Tucker. Informed it was re-faxed to [place] [employer] on 12/22 for chart. Stated she will f/u, informed I will ask our nurse Regino Schultze H) to f/u also.

## 2019-05-21 NOTE — Telephone Encounter (Signed)
Was done and approved. 

## 2019-05-28 ENCOUNTER — Other Ambulatory Visit: Payer: Self-pay

## 2019-05-28 ENCOUNTER — Ambulatory Visit (INDEPENDENT_AMBULATORY_CARE_PROVIDER_SITE_OTHER): Payer: Medicaid Other | Admitting: Student in an Organized Health Care Education/Training Program

## 2019-05-28 VITALS — BP 97/64 | HR 117 | Temp 98.4°F | Ht 71.0 in | Wt 140.0 lb

## 2019-05-28 DIAGNOSIS — R634 Abnormal weight loss: Secondary | ICD-10-CM

## 2019-05-28 DIAGNOSIS — M545 Low back pain: Secondary | ICD-10-CM | POA: Diagnosis not present

## 2019-05-28 DIAGNOSIS — Z872 Personal history of diseases of the skin and subcutaneous tissue: Secondary | ICD-10-CM

## 2019-05-28 DIAGNOSIS — G8929 Other chronic pain: Secondary | ICD-10-CM | POA: Diagnosis not present

## 2019-05-28 DIAGNOSIS — F1120 Opioid dependence, uncomplicated: Secondary | ICD-10-CM | POA: Diagnosis present

## 2019-05-28 DIAGNOSIS — F1190 Opioid use, unspecified, uncomplicated: Secondary | ICD-10-CM

## 2019-05-28 DIAGNOSIS — B182 Chronic viral hepatitis C: Secondary | ICD-10-CM

## 2019-05-28 DIAGNOSIS — Z8739 Personal history of other diseases of the musculoskeletal system and connective tissue: Secondary | ICD-10-CM | POA: Diagnosis not present

## 2019-05-28 DIAGNOSIS — Z681 Body mass index (BMI) 19 or less, adult: Secondary | ICD-10-CM | POA: Diagnosis not present

## 2019-05-28 DIAGNOSIS — Z8679 Personal history of other diseases of the circulatory system: Secondary | ICD-10-CM | POA: Diagnosis not present

## 2019-05-28 DIAGNOSIS — F1199 Opioid use, unspecified with unspecified opioid-induced disorder: Secondary | ICD-10-CM

## 2019-05-28 MED ORDER — BUPRENORPHINE HCL-NALOXONE HCL 8-2 MG SL FILM: 1.0000 | ORAL_FILM | Freq: Two times a day (BID) | SUBLINGUAL | 0 refills | Status: DC

## 2019-05-28 NOTE — Assessment & Plan Note (Signed)
Chronic hepatitis C genotype 2B with positive viral load 1 month ago.  Last fibrosure showed low fibrosis score F1-2 but high activity at A3.  We are planning to treat with a course of Mavyret.  Seems like this has been approved by Medicaid.  We are going to follow-up with his pharmacy in [place] to see if they can dispense for if we need to utilize the [employer].

## 2019-05-28 NOTE — Assessment & Plan Note (Signed)
Seems to be well controlled.  Plan to continue with Suboxone 8 mg 2 times daily.  Good access through Medicaid.  Tox assure was appropriate 1 month ago.  I reviewed the database which was appropriate.  Follow-up with [place] in 4 weeks for a telephone visit.

## 2019-05-28 NOTE — Patient Instructions (Addendum)
It was good seeing you today in the clinic.  We will continue the Suboxone as it is prescribed.  We will follow-up on the MRI to see if it needs to be recertified for you to reschedule an appointment.  We are following up with your pharmacy to see if they can dispense the hepatitis C medication or if we need to utilize a specialty pharmacy.  Follow-up with [place] in 4 weeks for a telephone visit.

## 2019-05-28 NOTE — Assessment & Plan Note (Signed)
Severe chronic low back pain unchanged over the last 4 weeks.  Seems to be function limiting.  MRI was ordered in December to rule out new osteomyelitis or discitis, will need to be rescheduled for later this month.  We will try to arrange for this.

## 2019-05-28 NOTE — Progress Notes (Signed)
   Assessment and Plan:  See Encounters tab for problem-based medical decision making.   __________________________________________________________  HPI:   39 year old person living with opioid use disorder here for follow-up of medication assisted treatment.  Patient reports doing well recently, no cravings, no recent heroin use.  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  Doing well at home, lives in [place].  Has a 21-month old daughter who he reports is doing very well.  Currently out of work which is difficult for him as he likes to stay busy and is highly motivated.  Reports good adherence to Suboxone, no side effects.  Reports unchanged to worsening low back pain.  Limits activities throughout the day, for example he struggles to hold his 12 pound daughter for more than a few minutes.  No fevers or chills.  Does endorse unintentional weight loss of about 15 pounds over the last few months.  Reports normal appetite and access to food.  Was unable to complete the scheduled MRI because he had last-minute work,.  He is interested in rescheduling that MRI.  Has not heard anything about the hepatitis C medication which was prescribed recently.  It seems like the [facility]ill sent this prescription to the specialty pharmacy.  __________________________________________________________  Problem List: Patient Active Problem List   Diagnosis Date Noted  . Opioid use disorder (HCC) 08/08/2017    Priority: High  . Generalized anxiety disorder     Priority: Medium  . Chronic bilateral low back pain without sciatica 04/30/2019  . Chronic hepatitis C virus genotype 2b infection (HCC) 07/03/2018  . Insomnia 08/16/2017    Medications: Reconciled today in Epic __________________________________________________________  Physical Exam:  Vital Signs: Vitals:   05/28/19 0923  BP: 97/64  Pulse:  (!) 117  Temp: 98.4 F (36.9 C)  TempSrc: Oral  SpO2: 97%  Weight: 140 lb (63.5 kg)  Height: 5\' 11"  (1.803 m)    Gen: Well appearing, NAD Neck: No cervical LAD, No thyromegaly or nodules, No JVD. CV: RRR, no murmurs Pulm: Normal effort, CTA throughout, no wheezing Abd: Soft, NT, ND, no hepatomegaly or splenomegaly Ext: Warm, no edema, normal joints

## 2019-06-25 ENCOUNTER — Encounter: Payer: Self-pay | Admitting: Internal Medicine

## 2019-06-25 ENCOUNTER — Ambulatory Visit (INDEPENDENT_AMBULATORY_CARE_PROVIDER_SITE_OTHER): Payer: Medicaid Other | Admitting: Internal Medicine

## 2019-06-25 ENCOUNTER — Telehealth: Payer: Self-pay | Admitting: General Practice

## 2019-06-25 ENCOUNTER — Other Ambulatory Visit: Payer: Self-pay

## 2019-06-25 DIAGNOSIS — B182 Chronic viral hepatitis C: Secondary | ICD-10-CM | POA: Diagnosis not present

## 2019-06-25 DIAGNOSIS — F1120 Opioid dependence, uncomplicated: Secondary | ICD-10-CM | POA: Diagnosis not present

## 2019-06-25 DIAGNOSIS — F1190 Opioid use, unspecified, uncomplicated: Secondary | ICD-10-CM

## 2019-06-25 DIAGNOSIS — F1199 Opioid use, unspecified with unspecified opioid-induced disorder: Secondary | ICD-10-CM

## 2019-06-25 MED ORDER — BUPRENORPHINE HCL-NALOXONE HCL 8-2 MG SL FILM: 1.0000 | ORAL_FILM | Freq: Two times a day (BID) | SUBLINGUAL | 0 refills | Status: DC

## 2019-06-25 NOTE — Progress Notes (Signed)
Attempted to call patient three times. The first time a women answered his home phone. Told me she would have him call me back and hung up on me. Patient called back and gave a different number for me to call, [phone]. Patient answered, but put me on hold while he was talking with another person. I waited for approximately 5 minutes then hung up. Called back 30 minutes later and patient did not answer the phone. Left voicemail for him to call us back.

## 2019-06-25 NOTE — Assessment & Plan Note (Addendum)
I was eventually able to get a hold of Clifford Tucker. He was in the middle of fixing his car and seemed very irritated while we spoke. The visit seemed to be an inconvenience for him. I offered to have him rescheduled if this was not a good time, and he declined. Said he "had no choice" because he needs the medicine. He was very curt over the phone answering my questions in brief one word yes / no answers. Says he is doing well with suboxone, still taking twice a day.  Denies any issues with the medicine. Denies cravings or relapse. Reviewed database. Refill history and last UDS are appropriate. I sent a 1 month refill of his medicine to his pharmacy and instructed patient to follow up in person in 1 month.

## 2019-06-25 NOTE — Progress Notes (Signed)
    This is a telephone encounter between [name] and Reymundo Poll on 06/25/2019 for OUD follow up. The visit was conducted with the patient located at home and Reymundo Poll at [facility]. The patient's identity was confirmed using their DOB and current address. The patient has consented to being evaluated through a telephone encounter and understands the associated risks (an examination cannot be done and the patient may need to come in for an appointment) / benefits (allows the patient to remain at home, decreasing exposure to coronavirus). I personally spent 12 minutes on medical discussion.   CC: OUD follow up  HPI:  Mr.Olusegun Eichorn is a 39 y.o. male with past medical history outlined below. Patient was contacted for a telehealth appointment for OUD follow up. For the details of today's visit, please refer to the assessment and plan.   Review of Systems  Respiratory: Negative for shortness of breath.   Cardiovascular: Negative for chest pain.     Assessment & Plan:   See Encounters Tab for problem based charting.

## 2019-06-25 NOTE — Assessment & Plan Note (Signed)
Looks like [facility]. Patient does not know anything about this. Was told someone would "let him know" about the prescription. He was not able to talk long on the phone today so we did not address this further. He is returning for in person follow up in 1 month. Will plan to readdress then.

## 2019-07-23 ENCOUNTER — Other Ambulatory Visit: Payer: Self-pay

## 2019-07-23 ENCOUNTER — Ambulatory Visit (INDEPENDENT_AMBULATORY_CARE_PROVIDER_SITE_OTHER): Payer: Medicaid Other | Admitting: Internal Medicine

## 2019-07-23 DIAGNOSIS — B182 Chronic viral hepatitis C: Secondary | ICD-10-CM | POA: Diagnosis not present

## 2019-07-23 DIAGNOSIS — F1190 Opioid use, unspecified, uncomplicated: Secondary | ICD-10-CM

## 2019-07-23 DIAGNOSIS — G4700 Insomnia, unspecified: Secondary | ICD-10-CM

## 2019-07-23 DIAGNOSIS — F1199 Opioid use, unspecified with unspecified opioid-induced disorder: Secondary | ICD-10-CM

## 2019-07-23 MED ORDER — BUPRENORPHINE HCL-NALOXONE HCL 8-2 MG SL FILM: 1.0000 | ORAL_FILM | Freq: Two times a day (BID) | SUBLINGUAL | 0 refills | Status: DC

## 2019-07-23 NOTE — Assessment & Plan Note (Signed)
Discussed with Thayer Ohm today.  He continues to have issues falling asleep, using a sleep aide.  He then has problems with sleep maintenance.  He is not sure what wakes him up, but he feels that he wakes up multiple times a night despite being tired.  He has tried his daughters melatonin.    I advised him to try a higher dose of melatonin, up to 10mg .   He will need a thorough sleep hygiene evaluation at next visit.  He has concomitant anxiety and difficulty finding work which is likely contributing.  Short acting sleep aids are unlikely to make a big difference, but could be tried.

## 2019-07-23 NOTE — Assessment & Plan Note (Signed)
Reports that he still has not heard anything from the pharmacy.  Rx is in our system.  I will need to call the pharmacy to see if we can get him started on the Mavyret.   He expressed understanding of this plan.

## 2019-07-23 NOTE — Assessment & Plan Note (Signed)
Doing well, no issues.  No relapse, no cravings.  Last UDS was appropriate.  He will need an in person follow up in 1 month if he has transportation.   Plan Continue buprenorphine 8-2mg  BID dosing, Rx sent PDMP reviewed and appropriate Continue to support his chronic care needs.

## 2019-07-23 NOTE — Progress Notes (Signed)
  [facility] Internal Medicine Residency Telephone Encounter Continuity Care Appointment  HPI:   This telephone encounter was created for Mr. Jin Capote on 07/23/2019 for the following purpose/cc follow up for OUD.  07/23/2019  [name] presents for follow up of opioid use disorder I have reviewed the prior induction visit, follow up visits, and telephone encounters relevant to opiate use disorder (OUD) treatment.   Current daily dose: Suboxone 8-2mg  BID  Date of Induction: 08/02/17  Current follow up interval, in weeks: 4 weeks  The patient has been adherent with the buprenorphine for OUD contract.   Last UDS Result: appropriate   HPI: Mr. Bilodeau is a 38yo man living with OUD.  He reports that he is doing well.  He has lost transportation and also lost out on a job opportunity which is causing him some distress at the moment.  He is hopeful to have this resolved soon.  He was not able to come in person today.  He notes that he does not have any issues with withdrawal, cravings or relapse.  He has 2 children who seem to be doing well.  He continues to have issues with insomnia.  Notes that he takes a sleep aid over the counter (sounds like benadryl product) and has tried children's melatonin for the sleep.  He wakes up what seems like 20 times a night, feels tired, but struggles to remain asleep.  He has not heard anything about the Mavyret prescription.  He is willing to take the medication, but he was waiting on a call back from the pharmacy.    Past Medical History:  Past Medical History:  Diagnosis Date  . Bacteremia due to methicillin susceptible Staphylococcus aureus (MSSA) 06/06/2017  . Chest wall abscess   . Hx of septic arthritis    Left hip  . 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 Injury of flexor tendon of left hand 05/08/2018  . Left hip postoperative wound infection   . MSSA (methicillin susceptible Staphylococcus aureus) 05/2017  . MSSA bacteremia   .  Neck abscess 06/05/2017  . Normocytic anemia   . Opioid use disorder (HCC) 08/08/2017  . Opioid use disorder (HCC)   . Pneumonia   . Septic arthritis of hip (HCC) 06/06/2017  . Septic arthritis of sternoclavicular joint, right (HCC) 06/06/2017      ROS:   Sleep issues, otherwise doing well. No relapse.    Assessment / Plan / Recommendations:   Please see A&P under problem oriented charting for assessment of the patient's acute and chronic medical conditions.   As always, pt is advised that if symptoms worsen or new symptoms arise, they should go to an urgent care facility or to to ER for further evaluation.   Consent and Medical Decision Making:   This is a telephone encounter between Braelynn Lupton and Debe Coder on 07/23/2019 for follow up of OUD. The visit was conducted with the patient located at home and Debe Coder at [facility]. The patient's identity was confirmed using their DOB and current address. The patient has consented to being evaluated through a telephone encounter and understands the associated risks (an examination cannot be done and the patient may need to come in for an appointment) / benefits (allows the patient to remain at home, decreasing exposure to coronavirus). I personally spent 10 minutes on medical discussion.   Return to clinic in 4 weeks for in person visit if able.

## 2019-08-08 ENCOUNTER — Telehealth: Payer: Self-pay | Admitting: *Deleted

## 2019-08-08 MED ORDER — MAVYRET 100-40 MG PO TABS: 3.0000 | ORAL_TABLET | Freq: Every day | ORAL | 1 refills | Status: DC

## 2019-08-08 NOTE — Addendum Note (Signed)
Addended by: Debe Coder B on: 08/08/2019 04:36 PM   Modules accepted: Orders

## 2019-08-08 NOTE — Telephone Encounter (Signed)
[facility]. Leta Jungling states this Rx was [facility]. Call placed to [facility] [phone]). Spoke with Thayer Ohm B who states med won't go through insurance as the cost exceeds the maximum. It is also requiring a PA. Thayer Ohm will fax [place] the info on starting the PA. Kinnie Feil, BSN, RN-BC

## 2019-08-08 NOTE — Telephone Encounter (Signed)
Thank you much.

## 2019-08-09 ENCOUNTER — Telehealth: Payer: Self-pay | Admitting: *Deleted

## 2019-08-09 NOTE — Telephone Encounter (Addendum)
Information was faxed to [employer] for PA for [employer]. Awaiting determination.  Call to [employer] to follow up on previous PA which was approved in December.  Spoke with representative who said that the PA had been approved and the medication was redied for pick up.  After 7 days and no response from patient medication was returned to shelf and status of PA was reversed.  [id number].  Angelina Ok, RN 08/09/2019 9:49 AM.  Call to [employer] Mavyret was approved 08/09/2019 thru 10-08-2019.  [id number].  Spoke to 2nd person at [employer] claim has been done awaiting decision from another payer due to cost of medication.  [phone].  Angelina Ok, RN 08/14/2019 10:55 AM.

## 2019-08-22 ENCOUNTER — Encounter: Payer: Self-pay | Admitting: Student in an Organized Health Care Education/Training Program

## 2019-08-22 ENCOUNTER — Other Ambulatory Visit: Payer: Self-pay | Admitting: Internal Medicine

## 2019-08-22 MED ORDER — BUPRENORPHINE HCL-NALOXONE HCL 8-2 MG SL FILM: 1.0000 | ORAL_FILM | Freq: Two times a day (BID) | SUBLINGUAL | 0 refills | Status: DC

## 2019-08-27 ENCOUNTER — Ambulatory Visit (INDEPENDENT_AMBULATORY_CARE_PROVIDER_SITE_OTHER): Payer: Medicaid Other | Admitting: Internal Medicine

## 2019-08-27 VITALS — BP 109/66 | HR 73 | Temp 98.4°F | Wt 140.0 lb

## 2019-08-27 DIAGNOSIS — Z79899 Other long term (current) drug therapy: Secondary | ICD-10-CM | POA: Diagnosis not present

## 2019-08-27 DIAGNOSIS — F1120 Opioid dependence, uncomplicated: Secondary | ICD-10-CM | POA: Diagnosis present

## 2019-08-27 DIAGNOSIS — F329 Major depressive disorder, single episode, unspecified: Secondary | ICD-10-CM | POA: Diagnosis not present

## 2019-08-27 DIAGNOSIS — F411 Generalized anxiety disorder: Secondary | ICD-10-CM

## 2019-08-27 DIAGNOSIS — B182 Chronic viral hepatitis C: Secondary | ICD-10-CM | POA: Diagnosis not present

## 2019-08-27 DIAGNOSIS — F1199 Opioid use, unspecified with unspecified opioid-induced disorder: Secondary | ICD-10-CM

## 2019-08-27 DIAGNOSIS — F1190 Opioid use, unspecified, uncomplicated: Secondary | ICD-10-CM

## 2019-08-27 MED ORDER — BUPRENORPHINE HCL-NALOXONE HCL 8-2 MG SL FILM: 1.0000 | ORAL_FILM | Freq: Two times a day (BID) | SUBLINGUAL | 0 refills | Status: DC

## 2019-08-27 MED ORDER — SERTRALINE HCL 50 MG PO TABS: 50.0000 mg | ORAL_TABLET | Freq: Every day | ORAL | 1 refills | Status: DC

## 2019-08-27 MED ORDER — DIAZEPAM 5 MG PO TABS: 5.0000 mg | ORAL_TABLET | Freq: Two times a day (BID) | ORAL | 1 refills | Status: DC | PRN

## 2019-08-27 NOTE — Assessment & Plan Note (Signed)
Well controlled.  Last UDS appropriate.  PDMP reviewed and appropriate.   Plan Continue buprenorphine 8-2mg  BID Check UDS today Consider weaning at next visit if mood better controlled Follow up in 4 weeks for OUD

## 2019-08-27 NOTE — Assessment & Plan Note (Signed)
PHQ-2 is a 0 today.  Offered behavioral health referral today and he declined.  I think this would be helpful for him in the future so would ask at every visit.  We discussed a long term medication and a short term medication.  He would like to try Valium again but at a different dose (he was on 5mg  BID).   Plan Sertraline 50mg  daily - advised that this is the best long term option for him.  Take at night Valium 5-10mg  BID - watch for drowsiness Follow up Telehealth in 2 weeks.  Consider increase in sertraline at that time.  Would continue to encourage counseling

## 2019-08-27 NOTE — Progress Notes (Signed)
   08/27/2019  Clifford Tucker presents for follow up of opioid use disorder I have reviewed the prior induction visit, follow up visits, and telephone encounters relevant to opiate use disorder (OUD) treatment.   Current daily dose: Buprenorphine 8-2mg  BID  Date of Induction: 08/02/17  Current follow up interval, in weeks: 4 weeks  The patient has been adherent with the buprenorphine for OUD contract.   Last UDS Result: appropriate  HPI: Clifford Tucker is a 39 year old man with opiate use disorder, chronic hepatitis C and intermittent anxiety/depression.  He presents today for follow up of his OUD.  He is very antsy and tells me that he has been struggling as of late.  He does not want to discuss his issues today, but just mentioning them he began to tear up.  He is asking for assistance with his anxiety and I believe some depression as well.  He notes no issues with his buprenorphine.  He is taking without issue and has had no cravings or relapse.  He is not working currently.  He has no other complaints at this time.   Exam:   Vitals:   08/27/19 0918  BP: 109/66  Pulse: 73  Temp: 98.4 F (36.9 C)  TempSrc: Oral  SpO2: 99%  Weight: 140 lb (63.5 kg)    General: Very antsy, pacing the room Eyes: Anicteric Skin: No track marks on arms  Assessment/Plan:  See Problem Based Charting in the Encounters Tab     Inez Catalina, MD  08/27/2019  9:23 AM

## 2019-08-27 NOTE — Patient Instructions (Signed)
Mr. Carrick - -  Thank you for coming in today.   For anxiety - -  Please try Sertraline 50mg  daily (take every night).  This medication will help a lot, but can take up to 6 weeks to work well.   In the mean time, please take Valium 1-2 tablets (5-10mg ) twice a day.  This may make you sleepy at first.    We have a counselor who can speak with you as well if you would like to try that.    Please make a telehealth appointment in 2 weeks to check in with on how things are going.   Thank you!

## 2019-08-27 NOTE — Assessment & Plan Note (Signed)
We did not get to discuss today.  A new Rx for his Mavyret was sent in about 2 weeks.  At follow up, will confirm that he has taken.

## 2019-08-30 LAB — TOXASSURE SELECT,+ANTIDEPR,UR

## 2019-09-10 ENCOUNTER — Ambulatory Visit (INDEPENDENT_AMBULATORY_CARE_PROVIDER_SITE_OTHER): Payer: Medicaid Other | Admitting: Student in an Organized Health Care Education/Training Program

## 2019-09-10 ENCOUNTER — Other Ambulatory Visit: Payer: Self-pay

## 2019-09-10 DIAGNOSIS — F1199 Opioid use, unspecified with unspecified opioid-induced disorder: Secondary | ICD-10-CM

## 2019-09-10 DIAGNOSIS — F1190 Opioid use, unspecified, uncomplicated: Secondary | ICD-10-CM

## 2019-09-10 DIAGNOSIS — B182 Chronic viral hepatitis C: Secondary | ICD-10-CM

## 2019-09-10 DIAGNOSIS — F411 Generalized anxiety disorder: Secondary | ICD-10-CM

## 2019-09-10 MED ORDER — DIAZEPAM 5 MG PO TABS: 5.0000 mg | ORAL_TABLET | Freq: Two times a day (BID) | ORAL | 0 refills | Status: DC | PRN

## 2019-09-10 MED ORDER — BUPRENORPHINE HCL-NALOXONE HCL 8-2 MG SL FILM: 1.0000 | ORAL_FILM | Freq: Two times a day (BID) | SUBLINGUAL | 0 refills | Status: DC

## 2019-09-10 MED ORDER — MAVYRET 100-40 MG PO TABS: 3.0000 | ORAL_TABLET | Freq: Every day | ORAL | 1 refills | Status: AC

## 2019-09-10 NOTE — Assessment & Plan Note (Signed)
Patient reports much improvement compared to last visit.  Plan to continue with sertraline 50 mg daily.  He reports finding significant benefit from Valium.  We talked about the short term nature of this medication.  We are going to decrease his Valium use a little bit, he will use no more than 2 tablets/day.  Follow-up with [place] in 2 weeks.

## 2019-09-10 NOTE — Progress Notes (Signed)
  [facility] Internal Medicine Residency Telephone Encounter Continuity Care Appointment  HPI:   This telephone encounter was created for [name] on 09/10/2019 for the following purpose/cc opioid use disorder.  39 year old person here on a telephone visit for medication-assisted treatment of opioid use disorder.  Doing well on Suboxone, no cravings, no relapses.  Struggling at last visit with anxiety.  He was started on sertraline 50 mg daily and also Valium 5 mg to be taken 2-3 times daily.  He reports good benefit from that.  Says his anxiety is under better control.  He got his car fixed, has reliable transportation, that was a significant source of stress for him.  He is planning on resuming work very soon which is important to him.  Family is doing well.  No other complaints or issues.   Past Medical History:  Past Medical History:  Diagnosis Date  . Bacteremia due to methicillin susceptible Staphylococcus aureus (MSSA) 06/06/2017  . Chest wall abscess   . Hx of septic arthritis    Left hip  . 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 Injury of flexor tendon of left hand 05/08/2018  . Left hip postoperative wound infection   . MSSA (methicillin susceptible Staphylococcus aureus) 05/2017  . MSSA bacteremia   . Neck abscess 06/05/2017  . Normocytic anemia   . Opioid use disorder (HCC) 08/08/2017  . Opioid use disorder (HCC)   . Pneumonia   . Septic arthritis of hip (HCC) 06/06/2017  . Septic arthritis of sternoclavicular joint, right (HCC) 06/06/2017      ROS:   No fevers or chills   Assessment / Plan / Recommendations:   Please see A&P under problem oriented charting for assessment of the patient's acute and chronic medical conditions.   As always, pt is advised that if symptoms worsen or new symptoms arise, they should go to an urgent care facility or to to ER for further evaluation.   Consent and Medical Decision Making:     This is a telephone encounter  between [name] and Clifford Tucker on 09/10/2019 for opioid use disorder. The visit was conducted with the patient located at home and Clifford Tucker at [facility]. The patient's identity was confirmed using their DOB and current address. The patient has consented to being evaluated through a telephone encounter and understands the associated risks (an examination cannot be done and the patient may need to come in for an appointment) / benefits (allows the patient to remain at home, decreasing exposure to coronavirus). I personally spent 9 minutes on medical discussion.

## 2019-09-10 NOTE — Assessment & Plan Note (Signed)
Chronic hepatitis C, approved for treatments through Medicaid in December.  He has had trouble getting [facility].  I am going to send the prescription to the [facility].  Should start Mavyret as soon as possible, 3 tablets daily, 8-week course.  Plan to recheck hepatitis C viral load at the 4-week mark and then 12 weeks after completing treatment.

## 2019-09-10 NOTE — Assessment & Plan Note (Signed)
Opioid use disorder is stable.  Plan to continue with Suboxone 8 mg films twice daily.  Reviewed the database which was appropriate.  Last urine toxicology in March was appropriate.  Follow-up in person in 2 weeks.

## 2019-09-18 ENCOUNTER — Other Ambulatory Visit: Payer: Self-pay | Admitting: Internal Medicine

## 2019-10-06 ENCOUNTER — Emergency Department (HOSPITAL_COMMUNITY)
Admission: EM | Admit: 2019-10-06 | Discharge: 2019-10-06 | Disposition: A | Discharge status: Home / Self Care | Payer: Medicaid Other | Attending: Emergency Medicine | Admitting: Emergency Medicine

## 2019-10-06 ENCOUNTER — Emergency Department (HOSPITAL_COMMUNITY): Payer: Medicaid Other

## 2019-10-06 DIAGNOSIS — R4182 Altered mental status, unspecified: Secondary | ICD-10-CM | POA: Diagnosis not present

## 2019-10-06 DIAGNOSIS — Y999 Unspecified external cause status: Secondary | ICD-10-CM | POA: Diagnosis not present

## 2019-10-06 DIAGNOSIS — Z96642 Presence of left artificial hip joint: Secondary | ICD-10-CM | POA: Insufficient documentation

## 2019-10-06 DIAGNOSIS — S0181XA Laceration without foreign body of other part of head, initial encounter: Secondary | ICD-10-CM

## 2019-10-06 DIAGNOSIS — W2201XA Walked into wall, initial encounter: Secondary | ICD-10-CM | POA: Insufficient documentation

## 2019-10-06 DIAGNOSIS — Y9389 Activity, other specified: Secondary | ICD-10-CM | POA: Diagnosis not present

## 2019-10-06 DIAGNOSIS — F17210 Nicotine dependence, cigarettes, uncomplicated: Secondary | ICD-10-CM | POA: Diagnosis not present

## 2019-10-06 DIAGNOSIS — Y92149 Unspecified place in prison as the place of occurrence of the external cause: Secondary | ICD-10-CM | POA: Diagnosis not present

## 2019-10-06 DIAGNOSIS — Z23 Encounter for immunization: Secondary | ICD-10-CM | POA: Diagnosis not present

## 2019-10-06 DIAGNOSIS — Z79899 Other long term (current) drug therapy: Secondary | ICD-10-CM | POA: Diagnosis not present

## 2019-10-06 DIAGNOSIS — S01111A Laceration without foreign body of right eyelid and periocular area, initial encounter: Secondary | ICD-10-CM | POA: Diagnosis not present

## 2019-10-06 LAB — ETHANOL: Alcohol, Ethyl (B): 152 mg/dL — ABNORMAL HIGH (ref <10)

## 2019-10-06 LAB — BASIC METABOLIC PANEL
Anion gap: 11 (ref 5–15)
BUN: 10 mg/dL (ref 6–20)
CO2: 18 mmol/L — ABNORMAL LOW (ref 22–32)
Calcium: 8.6 mg/dL — ABNORMAL LOW (ref 8.9–10.3)
Chloride: 111 mmol/L (ref 98–111)
Creatinine, Ser: 0.68 mg/dL (ref 0.61–1.24)
GFR calc Af Amer: >60 mL/min (ref >60)
GFR calc non Af Amer: >60 mL/min (ref >60)
Glucose, Bld: 90 mg/dL (ref 70–99)
Potassium: 3.3 mmol/L — ABNORMAL LOW (ref 3.5–5.1)
Sodium: 140 mmol/L (ref 135–145)

## 2019-10-06 LAB — CBC
HCT: 36.9 % — ABNORMAL LOW (ref 39.0–52.0)
Hemoglobin: 12.3 g/dL — ABNORMAL LOW (ref 13.0–17.0)
MCH: 31.1 pg (ref 26.0–34.0)
MCHC: 33.3 g/dL (ref 30.0–36.0)
MCV: 93.4 fL (ref 80.0–100.0)
Platelets: 173 10*3/uL (ref 150–400)
RBC: 3.95 MIL/uL — ABNORMAL LOW (ref 4.22–5.81)
RDW: 13.2 % (ref 11.5–15.5)
WBC: 13.5 10*3/uL — ABNORMAL HIGH (ref 4.0–10.5)
nRBC: 0 % (ref 0.0–0.2)

## 2019-10-06 LAB — RAPID HIV SCREEN (HIV 1/2 AB+AG)
HIV 1/2 Antibodies: NONREACTIVE
HIV-1 P24 Antigen - HIV24: NONREACTIVE

## 2019-10-06 LAB — HEPATITIS B SURFACE ANTIGEN: Hepatitis B Surface Ag: NONREACTIVE

## 2019-10-06 MED ORDER — HALOPERIDOL LACTATE 5 MG/ML IJ SOLN
INTRAMUSCULAR | Status: AC
  Administered 2019-10-06: 10 mg via INTRAMUSCULAR
  Filled 2019-10-06: qty 2

## 2019-10-06 MED ORDER — HALOPERIDOL LACTATE 5 MG/ML IJ SOLN
10.0000 mg | Freq: Once | INTRAMUSCULAR | Status: AC
  Filled 2019-10-06: qty 2

## 2019-10-06 MED ORDER — TETANUS-DIPHTH-ACELL PERTUSSIS 5-2.5-18.5 LF-MCG/0.5 IM SUSP
0.5000 mL | Freq: Once | INTRAMUSCULAR | Status: AC
  Administered 2019-10-06: 0.5 mL via INTRAMUSCULAR
  Filled 2019-10-06: qty 0.5

## 2019-10-06 MED ORDER — LIDOCAINE-EPINEPHRINE (PF) 2 %-1:200000 IJ SOLN
10.0000 mL | Freq: Once | INTRAMUSCULAR | Status: AC
  Administered 2019-10-06: 10 mL
  Filled 2019-10-06: qty 20

## 2019-10-06 MED ORDER — HALOPERIDOL LACTATE 5 MG/ML IJ SOLN
10.0000 mg | Freq: Once | INTRAMUSCULAR | Status: AC
  Administered 2019-10-06: 10 mg via INTRAMUSCULAR

## 2019-10-06 NOTE — ED Notes (Signed)
Pt refusing to allow contacts to be notified about situation
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
## 2019-10-06 NOTE — ED Notes (Signed)
Pt refusing to allow wounds to be cleaned until pictures are documented in his chart, pt argumentative with this NT and Will, RN. MD notified and photos documented. Pt continues refusing care.

## 2019-10-06 NOTE — ED Triage Notes (Signed)
Pt arrived from jail via [facility] in restraints. With c/o altered mental status and head laceration. Pt calm at time of entry into ED so restraints removed. Pt had cyclical conversation and asked to leave. While attempting to communicate with md pt became combative including swinging on staff, spitting on staff and kicking staff. Pt placed in restraints per MD and 20mg  haldol given. MD at bedisde
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
## 2019-10-06 NOTE — Discharge Instructions (Addendum)
Your stitches will dissolve, they do not need to be taken out °

## 2019-10-07 LAB — HCV RNA QUANT
HCV Quantitative Log: 6.386 log10 IU/mL (ref >1.70)
HCV Quantitative: 2430000 IU/mL (ref >50)

## 2019-10-10 ENCOUNTER — Ambulatory Visit: Payer: Medicaid Other | Attending: Internal Medicine

## 2019-10-10 DIAGNOSIS — Z23 Encounter for immunization: Secondary | ICD-10-CM

## 2019-10-10 NOTE — Progress Notes (Signed)
   Covid-19 Vaccination Clinic  Name:  Clifford Tucker    MRN: [id number] DOB: 1981-04-18  10/10/2019  Mr. Vacha refused to be observed. He was provided with Vaccine Information Sheet and instruction to access the V-Safe system.   Mr. Warwick was instructed to call 911 with any severe reactions post vaccine: Marland Kitchen Difficulty breathing  . Swelling of face and throat  . A fast heartbeat  . A bad rash all over body  . Dizziness and weakness   Immunizations Administered    Name Date Dose VIS Date Route   Pfizer COVID-19 Vaccine 10/10/2019  4:25 PM 0.3 mL 07/17/2018 Intramuscular   Manufacturer: [employer], [employer]   Lot: [id number]   NDC: [address]-1

## 2019-10-17 ENCOUNTER — Other Ambulatory Visit: Payer: Self-pay | Admitting: Student in an Organized Health Care Education/Training Program

## 2019-10-17 ENCOUNTER — Encounter: Payer: Self-pay | Admitting: Student in an Organized Health Care Education/Training Program

## 2019-10-17 MED ORDER — DIAZEPAM 5 MG PO TABS: 5.0000 mg | ORAL_TABLET | Freq: Two times a day (BID) | ORAL | 0 refills | Status: DC | PRN

## 2019-10-17 NOTE — Telephone Encounter (Signed)
Epic not currently allowing e-scribe of controlled meds. Can you call this in please? 

## 2019-10-31 ENCOUNTER — Encounter: Payer: Self-pay | Admitting: Student in an Organized Health Care Education/Training Program

## 2019-10-31 ENCOUNTER — Other Ambulatory Visit: Payer: Self-pay | Admitting: Internal Medicine

## 2019-11-01 ENCOUNTER — Encounter: Payer: Self-pay | Admitting: Student in an Organized Health Care Education/Training Program

## 2019-11-01 MED ORDER — DIAZEPAM 5 MG PO TABS: 5.0000 mg | ORAL_TABLET | Freq: Two times a day (BID) | ORAL | 0 refills | Status: DC | PRN

## 2019-11-04 ENCOUNTER — Telehealth: Payer: Self-pay | Admitting: *Deleted

## 2019-11-04 NOTE — Telephone Encounter (Signed)
Refill request for escitalopram 10mg  is sent by , states pt is requesting a new rx

## 2019-11-05 NOTE — Telephone Encounter (Signed)
Notified pharmacy.

## 2019-11-05 NOTE — Telephone Encounter (Signed)
It appears he is now on zoloft, we wouldn't want to prescribe escitalopram with zoloft as the medications are too similar.

## 2019-11-12 ENCOUNTER — Other Ambulatory Visit: Payer: Self-pay

## 2019-11-12 ENCOUNTER — Ambulatory Visit (INDEPENDENT_AMBULATORY_CARE_PROVIDER_SITE_OTHER): Payer: Medicaid Other | Admitting: Student in an Organized Health Care Education/Training Program

## 2019-11-12 VITALS — BP 101/60 | HR 98 | Temp 98.1°F | Wt 136.8 lb

## 2019-11-12 DIAGNOSIS — F1199 Opioid use, unspecified with unspecified opioid-induced disorder: Secondary | ICD-10-CM

## 2019-11-12 DIAGNOSIS — F411 Generalized anxiety disorder: Secondary | ICD-10-CM

## 2019-11-12 DIAGNOSIS — F1190 Opioid use, unspecified, uncomplicated: Secondary | ICD-10-CM

## 2019-11-12 MED ORDER — BUPRENORPHINE HCL-NALOXONE HCL 8-2 MG SL FILM: 1.0000 | ORAL_FILM | Freq: Two times a day (BID) | SUBLINGUAL | 0 refills | Status: DC

## 2019-11-12 MED ORDER — FLUOXETINE HCL 20 MG PO CAPS: 20.0000 mg | ORAL_CAPSULE | Freq: Every day | ORAL | 2 refills | Status: DC

## 2019-11-12 MED ORDER — DIAZEPAM 5 MG PO TABS: 5.0000 mg | ORAL_TABLET | Freq: Two times a day (BID) | ORAL | 0 refills | Status: DC | PRN

## 2019-11-12 NOTE — Patient Instructions (Signed)
I am very sorry for all the stress that you are going through.  You are going to start an anxiety medicine called fluoxetine.  Take one half a tablet every day for a week and then 1 tablet every day from then on.   You can continue using Valium up to twice daily for anxiety, but we should be tapering this medicine over the next 2-4 weeks.  Send me a message to let me know how this is going.
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
## 2019-11-12 NOTE — Assessment & Plan Note (Signed)
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  At this point he has become pretty much dependent on Valium 5 mg twice daily, despite my warnings.  Having a lot of social stress right now related to separation from his wife and custody of his children, as well as pending legal troubles.  He is willing to do anything to get control of his anxiety and better cope with the stressors.  Order to start fluoxetine 10 mg daily for 1 week and then increase to 20 mg daily.  We will continue with diazepam 5 mg twice daily for now, given all the stress he is under I do not think it is a good time to discontinue the diazepam.  Hopefully we can taper off this medicine in 4 to 6 weeks as these stressors come to resolution.

## 2019-11-12 NOTE — Assessment & Plan Note (Signed)
Stable.  The significant social stress he is under right now is a risk factor for future relapse.  But overall he is doing well from this standpoint.  Plan to continue with Suboxone 1 film twice daily.  Follow-up with [place] in 4 weeks with a telephone visit.  Urine drug screen was appropriate last.  I reviewed the PDMP which was also appropriate.

## 2019-11-15 LAB — TOXASSURE SELECT,+ANTIDEPR,UR

## 2019-12-04 ENCOUNTER — Other Ambulatory Visit: Payer: Self-pay | Admitting: Student in an Organized Health Care Education/Training Program

## 2019-12-10 ENCOUNTER — Other Ambulatory Visit: Payer: Self-pay

## 2019-12-10 ENCOUNTER — Ambulatory Visit: Payer: Medicaid Other | Admitting: Internal Medicine

## 2019-12-10 DIAGNOSIS — F411 Generalized anxiety disorder: Secondary | ICD-10-CM

## 2019-12-10 DIAGNOSIS — F1190 Opioid use, unspecified, uncomplicated: Secondary | ICD-10-CM

## 2019-12-10 MED ORDER — BUPRENORPHINE HCL-NALOXONE HCL 8-2 MG SL FILM: 1.0000 | ORAL_FILM | Freq: Two times a day (BID) | SUBLINGUAL | 0 refills | Status: DC

## 2019-12-10 MED ORDER — DIAZEPAM 5 MG PO TABS: 5.0000 mg | ORAL_TABLET | Freq: Two times a day (BID) | ORAL | 0 refills | Status: DC | PRN

## 2019-12-16 NOTE — Progress Notes (Signed)
  [facility] Internal Medicine Residency Telephone Encounter Continuity Care Appointment  HPI:   This telephone encounter was created for [name] on 12/16/2019 for the following purpose/cc opioid use disorder.  39 year old person here on a telephone visit for medication-assisted treatment of opioid use disorder.  Overall he reports he is doing fine.  He denies any relapses with opioids he remains adherent to Suboxone.  He is still dealing with anxiety thinks is overall improved with prozac but still dependent on Valium times.  He does not think he can go without the occasional as needed Valium.   Past Medical History:  Past Medical History:  Diagnosis Date  . Bacteremia due to methicillin susceptible Staphylococcus aureus (MSSA) 06/06/2017  . Chest wall abscess   . Hx of septic arthritis    Left hip  . 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 Injury of flexor tendon of left hand 05/08/2018  . Left hip postoperative wound infection   . MSSA (methicillin susceptible Staphylococcus aureus) 05/2017  . MSSA bacteremia   . Neck abscess 06/05/2017  . Normocytic anemia   . Opioid use disorder (HCC) 08/08/2017  . Opioid use disorder (HCC)   . Pneumonia   . Septic arthritis of hip (HCC) 06/06/2017  . Septic arthritis of sternoclavicular joint, right (HCC) 06/06/2017     ROS:   No fevers or chills   Assessment / Plan / Recommendations:   Please see A&P under problem oriented charting for assessment of the patient's acute and chronic medical conditions.   As always, pt is advised that if symptoms worsen or new symptoms arise, they should go to an urgent care facility or to to ER for further evaluation.   Consent and Medical Decision Making:     This is a telephone encounter between Clifford Tucker and Gust Rung on 12/16/2019 for opioid use disorder. The visit was conducted with the patient located at home and Gust Rung at [facility]. The patient's identity was  confirmed using their DOB and current address. The patient has consented to being evaluated through a telephone encounter and understands the associated risks (an examination cannot be done and the patient may need to come in for an appointment) / benefits (allows the patient to remain at home, decreasing exposure to coronavirus). I personally spent 6 minutes on medical discussion.

## 2019-12-16 NOTE — Assessment & Plan Note (Signed)
Continue Prozac 20 mg daily discussed overall goal to wean off Valium but will give refill for this month.

## 2019-12-16 NOTE — Assessment & Plan Note (Signed)
Chronic and stable Continue Suboxone 8-2 mg 1 film twice daily Follow-up 4 weeks in person

## 2020-01-06 ENCOUNTER — Telehealth: Payer: Self-pay | Admitting: Student in an Organized Health Care Education/Training Program

## 2020-01-06 ENCOUNTER — Other Ambulatory Visit: Payer: Self-pay

## 2020-01-06 MED ORDER — BUPRENORPHINE HCL-NALOXONE HCL 8-2 MG SL FILM: 1.0000 | ORAL_FILM | Freq: Two times a day (BID) | SUBLINGUAL | 0 refills | Status: DC

## 2020-01-06 NOTE — Telephone Encounter (Signed)
Pt requesting a call back in reference to his Refill.  Pt requesting to know why he can not pick up his medication until tomorrow.  Please call patient back.   Buprenorphine HCl-Naloxone HCl (SUBOXONE) 8-2 MG FILM  [id number] - [place], [place] - [address] AT [facility]

## 2020-01-06 NOTE — Telephone Encounter (Signed)
Per dr Mikey Bussing called pharm, gave pharmacist permission to override and fill now, done

## 2020-01-06 NOTE — Telephone Encounter (Signed)
I have approved this refill. But please make a follow up in person appointment with [facility] when able. Thanks.

## 2020-01-06 NOTE — Telephone Encounter (Signed)
Buprenorphine HCl-Naloxone HCl (SUBOXONE) 8-2 MG FILM, REFILL REQUEST @  [id number] - [place], [place] - [address] AT [facility] Phone:  [phone]  Fax:  [phone]

## 2020-01-06 NOTE — Telephone Encounter (Signed)
One day early is fine.

## 2020-01-06 NOTE — Telephone Encounter (Signed)
Pt calls back and states [facility] not [facility] today [facility] and they state they will fill tomorrow unless VO from doctor Do you agree w/ VO to fill today? Last filled 7/20

## 2020-01-28 ENCOUNTER — Telehealth: Payer: Self-pay | Admitting: *Deleted

## 2020-01-28 MED ORDER — BUPRENORPHINE HCL-NALOXONE HCL 8-2 MG SL FILM: 1.0000 | ORAL_FILM | Freq: Two times a day (BID) | SUBLINGUAL | 0 refills | Status: DC

## 2020-01-28 NOTE — Telephone Encounter (Signed)
Requesting OUD meds.  RS appointment to 10/5.

## 2020-01-28 NOTE — Telephone Encounter (Signed)
Due to disruptions of COVID, OUD clinic had to be shuffled around.  Will refill at this time and follow up with him at next available time.  Debe Coder, MD

## 2020-01-30 ENCOUNTER — Telehealth: Payer: Self-pay | Admitting: General Practice

## 2020-01-30 NOTE — Telephone Encounter (Signed)
Pt is requesting more Buprenorphine HCl-Naloxone HCl (SUBOXONE) 8-2 MG FILM pt states he is going through it too fast  ;pt contact [phone]   [id number] [place] - [address] AT [facility]

## 2020-01-30 NOTE — Telephone Encounter (Signed)
This was refilled this week.   He needs an appointment, please schedule in resident clinic next week for assessment of needs given he is overusing his medication.  Debe Coder, MD

## 2020-01-31 ENCOUNTER — Ambulatory Visit (INDEPENDENT_AMBULATORY_CARE_PROVIDER_SITE_OTHER): Payer: Medicaid Other | Admitting: Internal Medicine

## 2020-01-31 ENCOUNTER — Encounter: Payer: Self-pay | Admitting: Internal Medicine

## 2020-01-31 ENCOUNTER — Telehealth: Payer: Self-pay | Admitting: General Practice

## 2020-01-31 ENCOUNTER — Other Ambulatory Visit: Payer: Self-pay

## 2020-01-31 VITALS — BP 114/89 | HR 98 | Temp 98.3°F | Ht 71.0 in | Wt 140.7 lb

## 2020-01-31 DIAGNOSIS — F1120 Opioid dependence, uncomplicated: Secondary | ICD-10-CM | POA: Diagnosis present

## 2020-01-31 DIAGNOSIS — F411 Generalized anxiety disorder: Secondary | ICD-10-CM | POA: Diagnosis not present

## 2020-01-31 DIAGNOSIS — F1190 Opioid use, unspecified, uncomplicated: Secondary | ICD-10-CM

## 2020-01-31 MED ORDER — CYCLOBENZAPRINE HCL 7.5 MG PO TABS
7.5000 mg | ORAL_TABLET | Freq: Three times a day (TID) | ORAL | 0 refills | Status: AC | PRN
Start: 2020-01-31 — End: ?

## 2020-01-31 NOTE — Telephone Encounter (Signed)
Pls contact pt regarding medicine he is sick, [phone]

## 2020-01-31 NOTE — Telephone Encounter (Signed)
RTC from patient, states he cannot pick up RX because it is too early for refill.  RN informed patient that he needs to be seen in office next week for assessment of needs given he is overusing his medicationper Dr. Donnetta Hutching instructions.  Patient placed on hold so RN could look for appointment.  When RN picked up line, call had been ended per patient. SChaplin, RN,BSN

## 2020-01-31 NOTE — Patient Instructions (Signed)
[name],  It was a pleasure taking of coffee here in the clinic today.  Congratulations on staying clean.  I hope all goes well with you and your family.  Take care! Dr. Dortha Schwalbe  Please call the internal medicine center clinic if you have any questions or concerns, we may be able to help and keep you from a long and expensive emergency room wait. Our clinic and after hours phone number is [phone], the best time to call is Monday through Friday 9 am to 4 pm but there is always someone available 24/7 if you have an emergency. If you need medication refills please notify your pharmacy one week in advance and they will send [place] a request.

## 2020-01-31 NOTE — Progress Notes (Signed)
   01/31/2020  [name] presents for follow up of opioid use disorder I have reviewed the prior induction visit, follow up visits, and telephone encounters relevant to opiate use disorder (OUD) treatment.   Current daily dose: Suboxone 8-2 mg film twice daily  Date of Induction: 08/02/2017  Current follow up interval, in weeks: 4  The patient has been adherent with the buprenorphine for OUD contract.   Last UDS Result: Inappropriate (11/12/2019)  HPI: [name] is a 39 year old gentleman with medical history significant for opioid use disorder here for medication assisted treatment.  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  He is currently living with his mother due to unfortunate events that occurred between him and his wife.  His only complain today is back pain when he is off his medications.  He states that he ran out of Suboxone on January 28, 2020 and has noticed that he would run out usually for 5 days before his medications are due.  Otherwise he is doing well and occasionally smokes marijuana but denies other opioid use including IV drugs or illicit drugs.  Exam:   Vitals:   01/31/20 1336  BP: 114/89  Pulse: 98  Temp: 98.3 F (36.8 C)  TempSrc: Oral  SpO2: 94%  Weight: 140 lb 11.2 oz (63.8 kg)  Height: 5\' 11"  (1.803 m)    Physical Exam Cardiovascular:     Rate and Rhythm: Normal rate.     Heart sounds: No murmur heard.   Pulmonary:     Effort: Pulmonary effort is normal.     Breath sounds: No rales.  Musculoskeletal:     Cervical back: Neck supple.  Neurological:     Mental Status: He is alert.  Psychiatric:        Mood and Affect: Mood normal.        Behavior: Behavior normal.     Assessment/Plan:  See Problem Based Charting in the Encounters Tab   , MD  01/31/2020  1:38 PM

## 2020-01-31 NOTE — Assessment & Plan Note (Addendum)
Opioid use disorder Unfortunately his pharmacy cannot fill out the prescription since it is 6 days early.  The best they can do is refill it on 02/03/2020.  Currently, he does not appear to be in acute withdrawal but will prescribe Flexeril #10 in case withdrawal arises.  Given his blood pressure of 114/89, he is not a candidate for clonidine. -Continue Suboxone 8-2 mg film twice daily -Follow-up in 4 weeks

## 2020-01-31 NOTE — Assessment & Plan Note (Signed)
Generalized anxiety disorder -Continue Prozac 20 mg daily -He has been off Valium for the past couple months.

## 2020-01-31 NOTE — Telephone Encounter (Signed)
Patient called back. Scheduled appt today at 1:45 with [facility]. Kinnie Feil, BSN, RN-BC

## 2020-01-31 NOTE — Addendum Note (Signed)
Addended by: Yvette Rack on: 01/31/2020 02:07 PM   Modules accepted: Orders

## 2020-01-31 NOTE — Telephone Encounter (Signed)
Agree with plan to be seen.  Thank you!

## 2020-01-31 NOTE — Telephone Encounter (Addendum)
Returned call to patient. No answer. Line went dead after 5 rings. No ability to leave VM message.  [facility] States [facility] but cannot fill till 02/03/2020. Patient picked up last Rx on 01/06/2020. Kinnie Feil, RN, BSN

## 2020-01-31 NOTE — Telephone Encounter (Signed)
Pls contact pt [phone]

## 2020-02-02 NOTE — Progress Notes (Signed)
Internal Medicine Clinic Attending  Case discussed with Dr. Dortha Schwalbe  At the time of the visit.  We reviewed the resident's history and exam and pertinent patient test results.  I agree with the assessment, diagnosis, and plan of care documented in the resident's note.   Unfortunately, he cannot get a refill for about 3 days given pharmacy and insurance rules.  We encouraged him to use other options for pain control.  He will need to be assessed for TID dosing possibly, but wraparound therapy with tylenol, NSAID and flexeril is appropriate at this time given his cravings and withdrawal are under control.

## 2020-02-04 LAB — TOXASSURE SELECT,+ANTIDEPR,UR

## 2020-02-25 ENCOUNTER — Telehealth: Payer: Self-pay

## 2020-02-25 NOTE — Telephone Encounter (Signed)
Patient given tele appt for next Tuesday, 03/03/2020 per Dr. Oswaldo Done. Patient very appreciative. Kinnie Feil, BSN, RN-BC

## 2020-02-25 NOTE — Telephone Encounter (Signed)
Patient called in stating he thought OUD appt today was to be done by telehealth as he had 2 in-person visits last month. Per chart review 11/12/2019 in person, 12/10/2019 tele, and 9/10 in person. States he is not currently out of suboxone but would like to have the dosage increased as he has run out early the last 2 months as he has needed to take more than prescribed. Patient has new job working 7 AM to 5 PM. Please advise. Kinnie Feil, BSN, RN-BC

## 2020-02-25 NOTE — Telephone Encounter (Signed)
Requesting to speak with a nurse about OUD appt. Please call pt back.  

## 2020-03-03 ENCOUNTER — Other Ambulatory Visit: Payer: Self-pay

## 2020-03-03 ENCOUNTER — Ambulatory Visit (INDEPENDENT_AMBULATORY_CARE_PROVIDER_SITE_OTHER): Payer: Medicaid Other | Admitting: Internal Medicine

## 2020-03-03 DIAGNOSIS — F411 Generalized anxiety disorder: Secondary | ICD-10-CM | POA: Diagnosis not present

## 2020-03-03 DIAGNOSIS — F1190 Opioid use, unspecified, uncomplicated: Secondary | ICD-10-CM

## 2020-03-03 MED ORDER — BUPRENORPHINE HCL-NALOXONE HCL 8-2 MG SL FILM: ORAL_FILM | SUBLINGUAL | 0 refills | Status: DC

## 2020-03-03 NOTE — Assessment & Plan Note (Signed)
He reports not taking his SSRI.  He has not been on a medication long enough to have an effect.  He has not been successful in maintaining his dose for longer than a few weeks.  Will need to continue to work with him on this issue.  He may benefit from getting set up with a PCP or therapist going forward.  Removed paroxetine from his med list.

## 2020-03-03 NOTE — Progress Notes (Signed)
  [facility] Internal Medicine Residency Telephone Encounter Continuity Care Appointment  HPI:   This telephone encounter was created for Clifford Tucker on 03/03/2020 for the following purpose/cc follow up of buprenorphine use and dosing for OUD.  Clifford Tucker is a 39 year old man.  He has been in treatment since in our clinic since March of 2019.  He has recently had social stressors including separation from family, legal issues and a new job.  Due to this, he has been taking increased buprenorphine during the day and running out at the end of the month.  He has taken cocaine once to "stay awake" but this is not typical for him.  He has had withdrawal symptoms.  He is not taking his SSRI at this time and I removed it from his medication list.  He would like to have an increased prescription to help with his current stressors.   He does continue to have depressive issues which include excessive fatigue due to insomnia.  He is not interested in further therapy at this time.  He would benefit from having a PCP, and this can b e discussed at next visit.    Past Medical History:  Past Medical History:  Diagnosis Date  . Bacteremia due to methicillin susceptible Staphylococcus aureus (MSSA) 06/06/2017  . Chest wall abscess   . Hx of septic arthritis    Left hip  . 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 Injury of flexor tendon of left hand 05/08/2018  . Left hip postoperative wound infection   . MSSA (methicillin susceptible Staphylococcus aureus) 05/2017  . MSSA bacteremia   . Neck abscess 06/05/2017  . Normocytic anemia   . Opioid use disorder (HCC) 08/08/2017  . Opioid use disorder (HCC)   . Pneumonia   . Septic arthritis of hip (HCC) 06/06/2017  . Septic arthritis of sternoclavicular joint, right (HCC) 06/06/2017      ROS:   FAtigue, cravings, concern for relapse.    Assessment / Plan / Recommendations:   Please see A&P under problem oriented charting for assessment of the  patient's acute and chronic medical conditions.   As always, pt is advised that if symptoms worsen or new symptoms arise, they should go to an urgent care facility or to to ER for further evaluation.   Consent and Medical Decision Making:   This is a telephone encounter between Clifford Tucker and Debe Coder on 03/03/2020 for follow up of OUD. The visit was conducted with the patient located at home and Debe Coder at [facility]. The patient's identity was confirmed using their DOB and current address. The patient has consented to being evaluated through a telephone encounter and understands the associated risks (an examination cannot be done and the patient may need to come in for an appointment) / benefits (allows the patient to remain at home, decreasing exposure to coronavirus). I personally spent 15 minutes on medical discussion.     Follow up in 4 weeks in person.  Needs afternoon appointment - would schedule in resident clinic.

## 2020-03-03 NOTE — Assessment & Plan Note (Signed)
[name] is having a difficult time at this time.  He is working long hours and has significant social stressors.  He is over taking his prescription.  To avoid possible relapse and as a harm reduction strategy, will plan to increase his buprenorphine-naloxone to 8-2mg  twice a day and 4-1mg  at mid day.  He is amenable to this plan.  He will need an in person visit with UDS at next visit.  He is only able to come in during the afternoon so this will be arranged.   Plan UDS at next visit 4 week follow up Increase buprenorphine to 2.5 films/day.  PDMP reviewed and appropriate

## 2020-03-04 ENCOUNTER — Telehealth: Payer: Self-pay

## 2020-03-04 NOTE — Telephone Encounter (Signed)
Sent in on yesterday.  Awaiting determination.

## 2020-03-04 NOTE — Telephone Encounter (Signed)
Requesting PA on Buprenorphine HCl-Naloxone HCl (SUBOXONE) 8-2 MG FILM.

## 2020-03-05 ENCOUNTER — Telehealth: Payer: Self-pay | Admitting: *Deleted

## 2020-03-05 NOTE — Telephone Encounter (Addendum)
Information was faxed to [employer] for PA for Buprenorphine approved.Angelina Ok, RN 03/05/2020 3:58 PM.  Call to [facility] [employer] Buprenorphine changed to name brand Suboxone which requires no PA on 03/03/2020.   [id number].  Angelina Ok, RN 03/19/2020 11:09 AM.

## 2020-03-31 ENCOUNTER — Encounter: Payer: Medicaid Other | Admitting: Student

## 2020-04-01 ENCOUNTER — Other Ambulatory Visit: Payer: Self-pay

## 2020-04-01 MED ORDER — BUPRENORPHINE HCL-NALOXONE HCL 8-2 MG SL FILM: ORAL_FILM | SUBLINGUAL | 0 refills | Status: DC

## 2020-04-01 NOTE — Telephone Encounter (Signed)
Ok, will refill a 2 week supply to bridge to his next in person visit.

## 2020-04-01 NOTE — Telephone Encounter (Signed)
Received TC from patient who states he missed his OUD appt yesterday because he didn't know he had one (pt had an appt scheduled with Dr. Imogene Burn yesterday). He is requesting an OUD appt.  Next available appt is 04/14/20 @ 0945, appt made for this time/date. OUD clinic is 115% full next week (04/07/20).  Patient is requesting refill on his suboxone. Will send to OUD attendings.  Please advise if you would like patient to come in earlier than the 23rd and if seeing a resident in regular clinic is ok as well. Thank you, SChaplin, RN,BSN

## 2020-04-14 ENCOUNTER — Other Ambulatory Visit: Payer: Self-pay

## 2020-04-14 ENCOUNTER — Ambulatory Visit (INDEPENDENT_AMBULATORY_CARE_PROVIDER_SITE_OTHER): Payer: Medicaid Other | Admitting: Internal Medicine

## 2020-04-14 VITALS — BP 108/61 | HR 71 | Temp 97.7°F | Ht 71.0 in | Wt 146.9 lb

## 2020-04-14 DIAGNOSIS — F1190 Opioid use, unspecified, uncomplicated: Secondary | ICD-10-CM | POA: Diagnosis not present

## 2020-04-14 DIAGNOSIS — B182 Chronic viral hepatitis C: Secondary | ICD-10-CM

## 2020-04-14 MED ORDER — BUPRENORPHINE HCL-NALOXONE HCL 8-2 MG SL FILM: ORAL_FILM | SUBLINGUAL | 1 refills | Status: AC

## 2020-04-14 NOTE — Assessment & Plan Note (Signed)
Never completed treatment.  Fairly disinterested in treatment.  I discussed with him just to let us know if he chooses to want to have this treated.

## 2020-04-14 NOTE — Progress Notes (Signed)
   04/14/2020  Clifford Tucker presents for follow up of opioid use disorder I have reviewed the prior induction visit, follow up visits, and telephone encounters relevant to opiate use disorder (OUD) treatment.   Current daily dose: Suboxone 8-2 mg film twice daily  Date of Induction: 08/02/2017  Current follow up interval, in weeks: 4  The patient has been adherent with the buprenorphine for OUD contract.   Last UDS Result:  (01/31/2020) appropriate for suboxone, cocaine and ETOH present.  HPI: [name] is a 39 year old gentleman with medical history significant for opioid use disorder here for medication assisted treatment.  He notes that Suboxone continues to control cravings.  Work is going well.  Denies complaints although does admit some abdominal pain reports that he thinks he ate something bad does not want to talk about it further.  He did request that I remove his portal access.  He does not use the portal and believes his previous significant other had the login information.  Exam:   Vitals:   04/14/20 1016  BP: 108/61  Pulse: 71  Temp: 97.7 F (36.5 C)  TempSrc: Oral  SpO2: 99%  Weight: 146 lb 14.4 oz (66.6 kg)  Height: 5\' 11"  (1.803 m)   Gen: Mildly anxious appearing (actually improved from his typical affect during visits) Pulm: normal effort   Assessment/Plan:  See Problem Based Charting in the Encounters Tab   , DO  04/14/2020  1:34 PM

## 2020-04-14 NOTE — Assessment & Plan Note (Signed)
Overall Clifford Tucker appears to be doing fine with his opioid use disorder.  His concomitant anxiety seems reasonably well controlled.  His last urine tox was appropriate for Suboxone and without opioids.  I will have him follow-up in early January.
